# Patient Record
Sex: Female | Born: 1945 | Race: White | Hispanic: No | Smoking: Current every day smoker
Health system: Southern US, Community
[De-identification: ages and names within clinical notes are randomized; demographics above are authoritative.]

## PROBLEM LIST (undated history)

## (undated) DIAGNOSIS — C539 Malignant neoplasm of cervix uteri, unspecified: Secondary | ICD-10-CM

## (undated) DIAGNOSIS — F329 Major depressive disorder, single episode, unspecified: Secondary | ICD-10-CM

## (undated) DIAGNOSIS — D369 Benign neoplasm, unspecified site: Secondary | ICD-10-CM

## (undated) DIAGNOSIS — I639 Cerebral infarction, unspecified: Secondary | ICD-10-CM

## (undated) DIAGNOSIS — F32A Depression, unspecified: Secondary | ICD-10-CM

## (undated) DIAGNOSIS — A048 Other specified bacterial intestinal infections: Secondary | ICD-10-CM

## (undated) DIAGNOSIS — Z87442 Personal history of urinary calculi: Secondary | ICD-10-CM

## (undated) DIAGNOSIS — I2510 Atherosclerotic heart disease of native coronary artery without angina pectoris: Secondary | ICD-10-CM

## (undated) DIAGNOSIS — K861 Other chronic pancreatitis: Secondary | ICD-10-CM

## (undated) DIAGNOSIS — G8929 Other chronic pain: Secondary | ICD-10-CM

## (undated) DIAGNOSIS — M359 Systemic involvement of connective tissue, unspecified: Secondary | ICD-10-CM

## (undated) DIAGNOSIS — Z860100 Personal history of colon polyps, unspecified: Secondary | ICD-10-CM

## (undated) DIAGNOSIS — N200 Calculus of kidney: Secondary | ICD-10-CM

## (undated) DIAGNOSIS — R634 Abnormal weight loss: Secondary | ICD-10-CM

## (undated) DIAGNOSIS — J449 Chronic obstructive pulmonary disease, unspecified: Secondary | ICD-10-CM

## (undated) DIAGNOSIS — R109 Unspecified abdominal pain: Secondary | ICD-10-CM

## (undated) DIAGNOSIS — E785 Hyperlipidemia, unspecified: Secondary | ICD-10-CM

## (undated) DIAGNOSIS — F419 Anxiety disorder, unspecified: Secondary | ICD-10-CM

## (undated) DIAGNOSIS — K219 Gastro-esophageal reflux disease without esophagitis: Secondary | ICD-10-CM

## (undated) DIAGNOSIS — Z720 Tobacco use: Secondary | ICD-10-CM

## (undated) DIAGNOSIS — M549 Dorsalgia, unspecified: Secondary | ICD-10-CM

## (undated) DIAGNOSIS — K921 Melena: Secondary | ICD-10-CM

## (undated) HISTORY — DX: Hyperlipidemia, unspecified: E78.5

## (undated) HISTORY — DX: Cerebral infarction, unspecified: I63.9

## (undated) HISTORY — DX: Gastro-esophageal reflux disease without esophagitis: K21.9

## (undated) HISTORY — DX: Anxiety disorder, unspecified: F41.9

## (undated) HISTORY — PX: ABDOMINAL HYSTERECTOMY: SHX81

## (undated) HISTORY — DX: Calculus of kidney: N20.0

## (undated) HISTORY — DX: Abnormal weight loss: R63.4

## (undated) HISTORY — DX: Dorsalgia, unspecified: M54.9

## (undated) HISTORY — PX: PARTIAL HYSTERECTOMY: SHX80

## (undated) HISTORY — DX: Tobacco use: Z72.0

## (undated) HISTORY — DX: Benign neoplasm, unspecified site: D36.9

## (undated) HISTORY — DX: Atherosclerotic heart disease of native coronary artery without angina pectoris: I25.10

## (undated) HISTORY — DX: Personal history of colon polyps: Z86.010

## (undated) HISTORY — DX: Melena: K92.1

## (undated) HISTORY — DX: Malignant neoplasm of cervix uteri, unspecified: C53.9

## (undated) HISTORY — PX: APPENDECTOMY: SHX54

## (undated) HISTORY — DX: Depression, unspecified: F32.A

## (undated) HISTORY — DX: Other chronic pancreatitis: K86.1

## (undated) HISTORY — DX: Other chronic pain: G89.29

## (undated) HISTORY — DX: Personal history of colon polyps, unspecified: Z86.0100

## (undated) HISTORY — DX: Major depressive disorder, single episode, unspecified: F32.9

## (undated) HISTORY — DX: Other specified bacterial intestinal infections: A04.8

## (undated) HISTORY — DX: Chronic obstructive pulmonary disease, unspecified: J44.9

---

## 1999-07-18 ENCOUNTER — Encounter: Payer: Self-pay | Admitting: Neurosurgery

## 1999-07-18 ENCOUNTER — Ambulatory Visit (HOSPITAL_COMMUNITY): Admission: RE | Admit: 1999-07-18 | Discharge: 1999-07-18 | Payer: Self-pay | Admitting: Neurosurgery

## 1999-07-20 ENCOUNTER — Encounter: Payer: Self-pay | Admitting: Neurosurgery

## 2001-03-16 ENCOUNTER — Encounter: Payer: Self-pay | Admitting: Family Medicine

## 2001-03-16 ENCOUNTER — Ambulatory Visit (HOSPITAL_COMMUNITY): Admission: RE | Admit: 2001-03-16 | Discharge: 2001-03-16 | Payer: Self-pay | Admitting: Family Medicine

## 2001-03-18 ENCOUNTER — Other Ambulatory Visit: Admission: RE | Admit: 2001-03-18 | Discharge: 2001-03-18 | Payer: Self-pay | Admitting: Family Medicine

## 2001-03-18 ENCOUNTER — Ambulatory Visit (HOSPITAL_COMMUNITY): Admission: RE | Admit: 2001-03-18 | Discharge: 2001-03-18 | Payer: Self-pay | Admitting: Family Medicine

## 2001-03-18 ENCOUNTER — Encounter: Payer: Self-pay | Admitting: Family Medicine

## 2001-03-27 ENCOUNTER — Emergency Department (HOSPITAL_COMMUNITY): Admission: EM | Admit: 2001-03-27 | Discharge: 2001-03-27 | Payer: Self-pay | Admitting: *Deleted

## 2001-03-27 ENCOUNTER — Encounter: Payer: Self-pay | Admitting: *Deleted

## 2001-05-19 ENCOUNTER — Encounter (HOSPITAL_COMMUNITY): Admission: RE | Admit: 2001-05-19 | Discharge: 2001-06-18 | Payer: Self-pay | Admitting: Rheumatology

## 2001-05-23 ENCOUNTER — Encounter: Payer: Self-pay | Admitting: Rheumatology

## 2001-06-24 ENCOUNTER — Ambulatory Visit (HOSPITAL_COMMUNITY): Admission: RE | Admit: 2001-06-24 | Discharge: 2001-06-24 | Payer: Self-pay | Admitting: Family Medicine

## 2001-06-24 ENCOUNTER — Encounter: Payer: Self-pay | Admitting: Family Medicine

## 2001-07-14 ENCOUNTER — Encounter (HOSPITAL_COMMUNITY): Admission: RE | Admit: 2001-07-14 | Discharge: 2001-08-13 | Payer: Self-pay | Admitting: Rheumatology

## 2001-08-02 ENCOUNTER — Emergency Department (HOSPITAL_COMMUNITY): Admission: EM | Admit: 2001-08-02 | Discharge: 2001-08-02 | Payer: Self-pay | Admitting: Emergency Medicine

## 2001-08-22 ENCOUNTER — Encounter: Payer: Self-pay | Admitting: Family Medicine

## 2001-08-22 ENCOUNTER — Ambulatory Visit (HOSPITAL_COMMUNITY): Admission: RE | Admit: 2001-08-22 | Discharge: 2001-08-22 | Payer: Self-pay | Admitting: Family Medicine

## 2001-09-08 ENCOUNTER — Encounter (HOSPITAL_COMMUNITY): Admission: RE | Admit: 2001-09-08 | Discharge: 2001-10-08 | Payer: Self-pay | Admitting: Rheumatology

## 2002-04-17 ENCOUNTER — Ambulatory Visit (HOSPITAL_COMMUNITY): Admission: RE | Admit: 2002-04-17 | Discharge: 2002-04-17 | Payer: Self-pay | Admitting: Neurosurgery

## 2002-04-17 ENCOUNTER — Encounter: Payer: Self-pay | Admitting: Neurosurgery

## 2002-10-08 ENCOUNTER — Encounter: Payer: Self-pay | Admitting: Emergency Medicine

## 2002-10-08 ENCOUNTER — Emergency Department (HOSPITAL_COMMUNITY): Admission: EM | Admit: 2002-10-08 | Discharge: 2002-10-08 | Payer: Self-pay | Admitting: Emergency Medicine

## 2002-10-09 ENCOUNTER — Encounter: Payer: Self-pay | Admitting: Emergency Medicine

## 2002-10-09 ENCOUNTER — Ambulatory Visit (HOSPITAL_COMMUNITY): Admission: RE | Admit: 2002-10-09 | Discharge: 2002-10-09 | Payer: Self-pay | Admitting: Emergency Medicine

## 2002-11-20 ENCOUNTER — Encounter: Payer: Self-pay | Admitting: Family Medicine

## 2002-11-20 ENCOUNTER — Ambulatory Visit (HOSPITAL_COMMUNITY): Admission: RE | Admit: 2002-11-20 | Discharge: 2002-11-20 | Payer: Self-pay | Admitting: Family Medicine

## 2002-12-28 ENCOUNTER — Encounter (HOSPITAL_COMMUNITY): Admission: RE | Admit: 2002-12-28 | Discharge: 2003-01-27 | Payer: Self-pay | Admitting: Rheumatology

## 2002-12-29 ENCOUNTER — Encounter: Payer: Self-pay | Admitting: Rheumatology

## 2003-01-12 ENCOUNTER — Encounter: Payer: Self-pay | Admitting: Rheumatology

## 2003-02-15 ENCOUNTER — Encounter: Payer: Self-pay | Admitting: Rheumatology

## 2003-02-15 ENCOUNTER — Encounter (HOSPITAL_COMMUNITY): Admission: RE | Admit: 2003-02-15 | Discharge: 2003-03-21 | Payer: Self-pay | Admitting: Rheumatology

## 2003-03-20 ENCOUNTER — Encounter (HOSPITAL_COMMUNITY): Admission: RE | Admit: 2003-03-20 | Discharge: 2003-04-19 | Payer: Self-pay | Admitting: Rheumatology

## 2003-05-14 ENCOUNTER — Emergency Department (HOSPITAL_COMMUNITY): Admission: EM | Admit: 2003-05-14 | Discharge: 2003-05-14 | Payer: Self-pay | Admitting: Emergency Medicine

## 2003-05-28 ENCOUNTER — Emergency Department (HOSPITAL_COMMUNITY): Admission: EM | Admit: 2003-05-28 | Discharge: 2003-05-29 | Payer: Self-pay | Admitting: *Deleted

## 2003-06-27 ENCOUNTER — Encounter: Payer: Self-pay | Admitting: *Deleted

## 2003-06-27 ENCOUNTER — Emergency Department (HOSPITAL_COMMUNITY): Admission: EM | Admit: 2003-06-27 | Discharge: 2003-06-27 | Payer: Self-pay | Admitting: *Deleted

## 2003-11-21 ENCOUNTER — Emergency Department (HOSPITAL_COMMUNITY): Admission: EM | Admit: 2003-11-21 | Discharge: 2003-11-21 | Payer: Self-pay | Admitting: Emergency Medicine

## 2004-02-02 ENCOUNTER — Emergency Department (HOSPITAL_COMMUNITY): Admission: EM | Admit: 2004-02-02 | Discharge: 2004-02-02 | Payer: Self-pay | Admitting: *Deleted

## 2004-05-23 ENCOUNTER — Inpatient Hospital Stay (HOSPITAL_COMMUNITY): Admission: EM | Admit: 2004-05-23 | Discharge: 2004-05-25 | Payer: Self-pay | Admitting: Emergency Medicine

## 2004-06-09 ENCOUNTER — Ambulatory Visit (HOSPITAL_COMMUNITY): Admission: RE | Admit: 2004-06-09 | Discharge: 2004-06-09 | Payer: Self-pay | Admitting: Family Medicine

## 2004-09-28 HISTORY — PX: COLONOSCOPY W/ POLYPECTOMY: SHX1380

## 2004-09-28 HISTORY — PX: ESOPHAGOGASTRODUODENOSCOPY: SHX1529

## 2005-02-25 ENCOUNTER — Ambulatory Visit (HOSPITAL_COMMUNITY): Admission: RE | Admit: 2005-02-25 | Discharge: 2005-02-25 | Payer: Self-pay | Admitting: Family Medicine

## 2005-05-06 ENCOUNTER — Ambulatory Visit (HOSPITAL_COMMUNITY): Admission: RE | Admit: 2005-05-06 | Discharge: 2005-05-06 | Payer: Self-pay | Admitting: Family Medicine

## 2005-06-22 ENCOUNTER — Ambulatory Visit (HOSPITAL_COMMUNITY): Admission: RE | Admit: 2005-06-22 | Discharge: 2005-06-22 | Payer: Self-pay | Admitting: Urology

## 2005-08-13 ENCOUNTER — Ambulatory Visit (HOSPITAL_COMMUNITY): Admission: RE | Admit: 2005-08-13 | Discharge: 2005-08-13 | Payer: Self-pay | Admitting: Urology

## 2005-09-01 ENCOUNTER — Ambulatory Visit: Payer: Self-pay | Admitting: Internal Medicine

## 2005-09-08 ENCOUNTER — Encounter: Payer: Self-pay | Admitting: Internal Medicine

## 2005-09-08 ENCOUNTER — Ambulatory Visit: Payer: Self-pay | Admitting: Internal Medicine

## 2005-09-08 ENCOUNTER — Ambulatory Visit (HOSPITAL_COMMUNITY): Admission: RE | Admit: 2005-09-08 | Discharge: 2005-09-08 | Payer: Self-pay | Admitting: Internal Medicine

## 2005-09-09 ENCOUNTER — Encounter (HOSPITAL_COMMUNITY): Admission: RE | Admit: 2005-09-09 | Discharge: 2005-09-09 | Payer: Self-pay | Admitting: Internal Medicine

## 2005-09-13 ENCOUNTER — Inpatient Hospital Stay (HOSPITAL_COMMUNITY): Admission: EM | Admit: 2005-09-13 | Discharge: 2005-09-15 | Payer: Self-pay | Admitting: Emergency Medicine

## 2005-09-29 ENCOUNTER — Ambulatory Visit: Payer: Self-pay | Admitting: Internal Medicine

## 2005-11-19 ENCOUNTER — Ambulatory Visit (HOSPITAL_COMMUNITY): Admission: RE | Admit: 2005-11-19 | Discharge: 2005-11-19 | Payer: Self-pay | Admitting: Family Medicine

## 2005-11-30 ENCOUNTER — Ambulatory Visit (HOSPITAL_COMMUNITY): Admission: RE | Admit: 2005-11-30 | Discharge: 2005-11-30 | Payer: Self-pay | Admitting: Internal Medicine

## 2005-12-03 ENCOUNTER — Ambulatory Visit: Payer: Self-pay | Admitting: Cardiology

## 2005-12-09 ENCOUNTER — Ambulatory Visit: Payer: Self-pay | Admitting: Cardiology

## 2005-12-09 ENCOUNTER — Encounter (HOSPITAL_COMMUNITY): Admission: RE | Admit: 2005-12-09 | Discharge: 2006-01-08 | Payer: Self-pay | Admitting: Cardiology

## 2005-12-20 ENCOUNTER — Emergency Department (HOSPITAL_COMMUNITY): Admission: EM | Admit: 2005-12-20 | Discharge: 2005-12-20 | Payer: Self-pay | Admitting: Emergency Medicine

## 2006-02-02 ENCOUNTER — Ambulatory Visit: Payer: Self-pay | Admitting: Cardiology

## 2006-03-01 ENCOUNTER — Ambulatory Visit: Payer: Self-pay | Admitting: Internal Medicine

## 2006-03-01 ENCOUNTER — Ambulatory Visit: Payer: Self-pay | Admitting: Cardiology

## 2006-06-20 ENCOUNTER — Emergency Department (HOSPITAL_COMMUNITY): Admission: EM | Admit: 2006-06-20 | Discharge: 2006-06-20 | Payer: Self-pay | Admitting: Emergency Medicine

## 2006-06-28 ENCOUNTER — Ambulatory Visit (HOSPITAL_COMMUNITY): Admission: RE | Admit: 2006-06-28 | Discharge: 2006-06-28 | Payer: Self-pay | Admitting: Urology

## 2006-09-16 ENCOUNTER — Ambulatory Visit (HOSPITAL_COMMUNITY): Admission: RE | Admit: 2006-09-16 | Discharge: 2006-09-16 | Payer: Self-pay | Admitting: Urology

## 2006-10-11 ENCOUNTER — Ambulatory Visit (HOSPITAL_COMMUNITY): Admission: RE | Admit: 2006-10-11 | Discharge: 2006-10-11 | Payer: Self-pay | Admitting: Family Medicine

## 2006-10-29 ENCOUNTER — Ambulatory Visit (HOSPITAL_COMMUNITY): Admission: RE | Admit: 2006-10-29 | Discharge: 2006-10-29 | Payer: Self-pay | Admitting: Family Medicine

## 2007-04-25 ENCOUNTER — Ambulatory Visit (HOSPITAL_COMMUNITY): Admission: RE | Admit: 2007-04-25 | Discharge: 2007-04-25 | Payer: Self-pay | Admitting: Family Medicine

## 2007-05-04 ENCOUNTER — Ambulatory Visit (HOSPITAL_COMMUNITY): Admission: RE | Admit: 2007-05-04 | Discharge: 2007-05-04 | Payer: Self-pay | Admitting: Family Medicine

## 2008-02-03 ENCOUNTER — Ambulatory Visit (HOSPITAL_COMMUNITY): Admission: RE | Admit: 2008-02-03 | Discharge: 2008-02-03 | Payer: Self-pay | Admitting: Family Medicine

## 2008-09-05 ENCOUNTER — Ambulatory Visit (HOSPITAL_COMMUNITY): Admission: RE | Admit: 2008-09-05 | Discharge: 2008-09-05 | Payer: Self-pay | Admitting: Family Medicine

## 2008-09-28 HISTORY — PX: EUS: SHX5427

## 2008-10-08 ENCOUNTER — Ambulatory Visit (HOSPITAL_COMMUNITY): Admission: RE | Admit: 2008-10-08 | Discharge: 2008-10-08 | Payer: Self-pay | Admitting: Urology

## 2008-10-16 ENCOUNTER — Ambulatory Visit: Payer: Self-pay | Admitting: Internal Medicine

## 2008-10-22 ENCOUNTER — Encounter: Payer: Self-pay | Admitting: Gastroenterology

## 2008-10-22 LAB — CONVERTED CEMR LAB
Alkaline Phosphatase: 83 units/L (ref 39–117)
Amylase: 44 units/L (ref 0–105)
BUN: 10 mg/dL (ref 6–23)
Basophils Absolute: 0 10*3/uL (ref 0.0–0.1)
CO2: 26 meq/L (ref 19–32)
Chloride: 102 meq/L (ref 96–112)
Creatinine, Ser: 0.53 mg/dL (ref 0.40–1.20)
Eosinophils Absolute: 0.2 10*3/uL (ref 0.0–0.7)
Eosinophils Relative: 3 % (ref 0–5)
Glucose, Bld: 87 mg/dL (ref 70–99)
LDL Cholesterol: 150 mg/dL — ABNORMAL HIGH (ref 0–99)
Lymphocytes Relative: 38 % (ref 12–46)
MCHC: 33.4 g/dL (ref 30.0–36.0)
Monocytes Absolute: 0.6 10*3/uL (ref 0.1–1.0)
Monocytes Relative: 9 % (ref 3–12)
Neutrophils Relative %: 50 % (ref 43–77)
Platelets: 301 10*3/uL (ref 150–400)
Potassium: 4.2 meq/L (ref 3.5–5.3)
RBC: 4.85 M/uL (ref 3.87–5.11)
RDW: 12.1 % (ref 11.5–15.5)
Total Bilirubin: 0.3 mg/dL (ref 0.3–1.2)
Total Protein: 6.9 g/dL (ref 6.0–8.3)
Triglycerides: 83 mg/dL (ref <150)
WBC: 7.3 10*3/uL (ref 4.0–10.5)

## 2008-11-26 ENCOUNTER — Encounter (INDEPENDENT_AMBULATORY_CARE_PROVIDER_SITE_OTHER): Payer: Self-pay | Admitting: General Surgery

## 2008-11-26 ENCOUNTER — Other Ambulatory Visit: Admission: RE | Admit: 2008-11-26 | Discharge: 2008-11-26 | Payer: Self-pay | Admitting: General Surgery

## 2008-12-19 DIAGNOSIS — M549 Dorsalgia, unspecified: Secondary | ICD-10-CM | POA: Insufficient documentation

## 2008-12-19 DIAGNOSIS — J449 Chronic obstructive pulmonary disease, unspecified: Secondary | ICD-10-CM | POA: Insufficient documentation

## 2008-12-19 DIAGNOSIS — K219 Gastro-esophageal reflux disease without esophagitis: Secondary | ICD-10-CM | POA: Insufficient documentation

## 2008-12-19 DIAGNOSIS — N200 Calculus of kidney: Secondary | ICD-10-CM | POA: Insufficient documentation

## 2008-12-19 DIAGNOSIS — J301 Allergic rhinitis due to pollen: Secondary | ICD-10-CM

## 2008-12-19 DIAGNOSIS — R109 Unspecified abdominal pain: Secondary | ICD-10-CM | POA: Insufficient documentation

## 2008-12-19 DIAGNOSIS — F341 Dysthymic disorder: Secondary | ICD-10-CM

## 2008-12-19 DIAGNOSIS — Z720 Tobacco use: Secondary | ICD-10-CM

## 2008-12-19 DIAGNOSIS — Z8719 Personal history of other diseases of the digestive system: Secondary | ICD-10-CM | POA: Insufficient documentation

## 2008-12-19 DIAGNOSIS — K861 Other chronic pancreatitis: Secondary | ICD-10-CM

## 2008-12-19 DIAGNOSIS — R110 Nausea: Secondary | ICD-10-CM

## 2008-12-19 DIAGNOSIS — Z8541 Personal history of malignant neoplasm of cervix uteri: Secondary | ICD-10-CM | POA: Insufficient documentation

## 2008-12-20 ENCOUNTER — Ambulatory Visit: Payer: Self-pay | Admitting: Internal Medicine

## 2008-12-20 DIAGNOSIS — Z860100 Personal history of colon polyps, unspecified: Secondary | ICD-10-CM | POA: Insufficient documentation

## 2008-12-29 ENCOUNTER — Emergency Department (HOSPITAL_COMMUNITY): Admission: EM | Admit: 2008-12-29 | Discharge: 2008-12-30 | Payer: Self-pay | Admitting: Emergency Medicine

## 2009-01-01 ENCOUNTER — Encounter: Payer: Self-pay | Admitting: Internal Medicine

## 2009-01-07 ENCOUNTER — Encounter: Payer: Self-pay | Admitting: Gastroenterology

## 2009-01-11 ENCOUNTER — Ambulatory Visit: Payer: Self-pay | Admitting: Internal Medicine

## 2009-01-11 ENCOUNTER — Ambulatory Visit (HOSPITAL_COMMUNITY): Admission: RE | Admit: 2009-01-11 | Discharge: 2009-01-11 | Payer: Self-pay | Admitting: Internal Medicine

## 2009-01-11 ENCOUNTER — Encounter: Payer: Self-pay | Admitting: Internal Medicine

## 2009-01-11 HISTORY — PX: OTHER SURGICAL HISTORY: SHX169

## 2009-01-14 ENCOUNTER — Encounter: Payer: Self-pay | Admitting: Internal Medicine

## 2009-01-22 ENCOUNTER — Telehealth: Payer: Self-pay | Admitting: Gastroenterology

## 2009-02-08 ENCOUNTER — Telehealth (INDEPENDENT_AMBULATORY_CARE_PROVIDER_SITE_OTHER): Payer: Self-pay | Admitting: *Deleted

## 2009-02-14 ENCOUNTER — Ambulatory Visit: Payer: Self-pay | Admitting: Gastroenterology

## 2009-02-14 ENCOUNTER — Ambulatory Visit (HOSPITAL_COMMUNITY): Admission: RE | Admit: 2009-02-14 | Discharge: 2009-02-14 | Payer: Self-pay | Admitting: Gastroenterology

## 2009-02-19 ENCOUNTER — Ambulatory Visit: Payer: Self-pay | Admitting: Internal Medicine

## 2009-02-19 ENCOUNTER — Telehealth (INDEPENDENT_AMBULATORY_CARE_PROVIDER_SITE_OTHER): Payer: Self-pay

## 2009-02-22 ENCOUNTER — Encounter: Payer: Self-pay | Admitting: Internal Medicine

## 2009-02-22 LAB — CONVERTED CEMR LAB: TSH: 1.576 microintl units/mL (ref 0.350–4.500)

## 2009-02-26 ENCOUNTER — Encounter: Payer: Self-pay | Admitting: Internal Medicine

## 2009-03-05 ENCOUNTER — Encounter (INDEPENDENT_AMBULATORY_CARE_PROVIDER_SITE_OTHER): Payer: Self-pay | Admitting: *Deleted

## 2009-03-28 ENCOUNTER — Ambulatory Visit (HOSPITAL_COMMUNITY): Admission: RE | Admit: 2009-03-28 | Discharge: 2009-03-28 | Payer: Self-pay | Admitting: Family Medicine

## 2009-07-30 ENCOUNTER — Encounter (INDEPENDENT_AMBULATORY_CARE_PROVIDER_SITE_OTHER): Payer: Self-pay | Admitting: *Deleted

## 2009-09-03 ENCOUNTER — Ambulatory Visit: Payer: Self-pay | Admitting: Internal Medicine

## 2009-09-19 ENCOUNTER — Telehealth (INDEPENDENT_AMBULATORY_CARE_PROVIDER_SITE_OTHER): Payer: Self-pay

## 2009-09-19 ENCOUNTER — Encounter: Payer: Self-pay | Admitting: Internal Medicine

## 2010-01-24 ENCOUNTER — Ambulatory Visit (HOSPITAL_COMMUNITY): Admission: RE | Admit: 2010-01-24 | Discharge: 2010-01-24 | Payer: Self-pay | Admitting: Family Medicine

## 2010-03-04 ENCOUNTER — Encounter: Payer: Self-pay | Admitting: Urgent Care

## 2010-03-21 ENCOUNTER — Encounter (INDEPENDENT_AMBULATORY_CARE_PROVIDER_SITE_OTHER): Payer: Self-pay | Admitting: *Deleted

## 2010-05-27 ENCOUNTER — Ambulatory Visit: Payer: Self-pay | Admitting: Internal Medicine

## 2010-05-27 DIAGNOSIS — R630 Anorexia: Secondary | ICD-10-CM

## 2010-05-27 DIAGNOSIS — R634 Abnormal weight loss: Secondary | ICD-10-CM

## 2010-05-27 DIAGNOSIS — Z8719 Personal history of other diseases of the digestive system: Secondary | ICD-10-CM | POA: Insufficient documentation

## 2010-06-03 ENCOUNTER — Encounter: Payer: Self-pay | Admitting: Internal Medicine

## 2010-06-04 LAB — CONVERTED CEMR LAB
ALT: <8 units/L (ref 0–35)
Calcium: 8.9 mg/dL (ref 8.4–10.5)
Glucose, Bld: 75 mg/dL (ref 70–99)
Hemoglobin: 14.2 g/dL (ref 12.0–15.0)
MCHC: 34.2 g/dL (ref 30.0–36.0)
MCV: 90.4 fL (ref 78.0–100.0)
RBC: 4.59 M/uL (ref 3.87–5.11)
TSH: 1.566 microintl units/mL (ref 0.350–4.500)

## 2010-06-06 ENCOUNTER — Ambulatory Visit (HOSPITAL_COMMUNITY): Admission: RE | Admit: 2010-06-06 | Discharge: 2010-06-06 | Payer: Self-pay | Admitting: Internal Medicine

## 2010-07-17 ENCOUNTER — Encounter (INDEPENDENT_AMBULATORY_CARE_PROVIDER_SITE_OTHER): Payer: Self-pay | Admitting: *Deleted

## 2010-08-29 ENCOUNTER — Inpatient Hospital Stay (HOSPITAL_COMMUNITY)
Admission: EM | Admit: 2010-08-29 | Discharge: 2010-08-31 | Payer: Self-pay | Source: Home / Self Care | Admitting: Psychiatry

## 2010-09-10 ENCOUNTER — Ambulatory Visit: Payer: Self-pay | Admitting: Internal Medicine

## 2010-09-10 DIAGNOSIS — R5381 Other malaise: Secondary | ICD-10-CM

## 2010-09-10 DIAGNOSIS — R5383 Other fatigue: Secondary | ICD-10-CM

## 2010-09-18 LAB — CONVERTED CEMR LAB: Cortisol - AM: 20.4 ug/dL (ref 4.3–22.4)

## 2010-09-28 ENCOUNTER — Emergency Department (HOSPITAL_COMMUNITY)
Admission: EM | Admit: 2010-09-28 | Discharge: 2010-09-28 | Payer: Self-pay | Source: Home / Self Care | Admitting: Emergency Medicine

## 2010-10-30 NOTE — Letter (Signed)
Summary: Recall Office Visit  [facility]  [address]   [place], [place] [address]   Phone: [phone]  Fax: [phone]      March 21, 2010   [name] [address] [place], [place]  [address] 1946/04/16   Dear [name],   According to our records, it is time for you to schedule a follow-up office visit with [place].   At your convenience, please call [phone] to schedule an office visit. If you have any questions, concerns, or feel that this letter is in error, we would appreciate your call.   Sincerely,    Diana Eves  [facility] Ph: [phone]   Fax: [phone]

## 2010-10-30 NOTE — Letter (Signed)
Summary: CT SCAN ORDER  CT SCAN ORDER   Imported By: Ave Filter 05/27/2010 15:23:51  _____________________________________________________________________  External Attachment:    Type:   Image     Comment:   External Document  Appended Document: CT SCAN ORDER Per Dr Kearney Hard pt may mix her contrast with any liquid of her choice.She will just need to bring the liquid into the hospital to drink.

## 2010-10-30 NOTE — Letter (Signed)
Summary: CT SCAN CHEST APPROVAL LETTER  CT SCAN CHEST APPROVAL LETTER   Imported By: Ave Filter 06/03/2010 08:49:10  _____________________________________________________________________  External Attachment:    Type:   Image     Comment:   External Document

## 2010-10-30 NOTE — Assessment & Plan Note (Signed)
Summary: fu chronic pancreatitis/ss   Visit Type:  Follow-up Visit Primary Care Provider:  McInnis  Chief Complaint:  F/U pancreatitis.  History of Present Illness: History of chronic calcific pancreatitis and weight loss. Distant history of alcohol abuse;  weight down another 4 ponds; now weighs  76 pounds. She does not get hungry. She has absolutely has no abdominal pain, nausea or vomiting. She denies diarrhea melena or hematochezia. She is continuing on Creon and proton pump inhibitor therapy. She denies illicit drug use or alcohol.  Prior EUS demonstrated changes consistent with chronic pancreatitis nothing amenable to endoscopic or surgical therapy.  Recent colonoscopy demonstrated tubular adenoma which was removed. She's due for surveillance examination 5 years.  Patient tells me she's never weighed over 100 pounds in her life except when she was pregnant. She tells me her nerves have been "workup" lately and this is why she is not hungry. She is a long, long term smoker.   Current Medications (verified): 1)  Duragesic 100 Mcg/hr Pt72 (Fentanyl) .... Every 3 Days 2)  Tylenol Extra Strength 500 Mg Tabs (Acetaminophen) .... As Needed 3)  Ativan 0.5 Mg Tabs (Lorazepam) .... Take 1 Tablet By Mouth Three Times A Day As Needed 4)  Cymbalta 60 Mg Cpep (Duloxetine Hcl) .... Take 1 Capsule By Mouth Once A Day 5)  Advair Diskus 100-50 Mcg/dose Misc (Fluticasone-Salmeterol) .... Inhale 1 Puff Two Times A Day 6)  Nitrostat 0.4 Mg Subl (Nitroglycerin) .... As Needed 7)  Colace 100 Mg Caps (Docusate Sodium) .... Once Daily As Needed 8)  Lipitor 10 Mg Tabs (Atorvastatin Calcium) .... Once Daily 9)  Toprol Xl 50 Mg Xr24h-Tab (Metoprolol Succinate) .... 1/2 Q Am, 1/2 Q Pm 10)  Fosamax 70 Mg Tabs (Alendronate Sodium) .... Once Weekly 11)  Omeprazole 20 Mg Cpdr (Omeprazole) .... Once Daily 12)  Creon 24000 Unit Cpep (Pancrelipase (Lip-Prot-Amyl)) .... 2 By Mouth With Meals, 1 By Mouth With  Snacks  Allergies (verified): 1)  ! Ibuprofen 2)  ! * Ivp Dye  Past History:  Past Medical History: Last updated: 2008/12/25  Current Problems (verified):  1)  Heart Disease  (ICD-429.9) 2)  Colonic Polyps, Adenomatous, Hx of  (ICD-V12.72) 3)  Smoker  (ICD-305.1) 4)  Cad  (ICD-414.00) 5)  Back Pain, Chronic  (ICD-724.5) 6)  Cervical Cancer, Hx of  (ICD-V10.41) 7)  Allergic Rhinitis, Seasonal  (ICD-477.0) 8)  Anxiety Depression  (ICD-300.4) 9)  Hx of Nausea  (ICD-787.02) 10)  Renal Calculus, Hx of  (ICD-V13.01) 11)  Chronic Pancreatitis  (ICD-577.1) 12)  Hematochezia, Hx of  (ICD-V12.79) 13)  Abdominal Pain  (ICD-789.00) 14)  Gerd  (ICD-530.81) 15)  COPD  (ICD-496) 16)  Fm Hx Malignant Neoplasm Gastrointestinal Tract  (ICD-V16.0) 17) EGD/TCS 12/06 by Dr. Jena Gauss - benign gastric nodule, ext hemorrhoids, multiple polyps in colon (adenomatous and one with tubular morphology)  Past Surgical History: Last updated: 12/25/2008 Partial hysterectomy Appendectomy  Family History: Last updated: 12/25/2008 Father: deceased age 42's with lymphoma and MI Mother:  Siblings: Aunt with lung cancer and breast cancer. Cousin and sister with pancreatitis but no alcohol use. Maternal grandmother with colon cancer.   Social History: Last updated: 2008/12/25 Marital Status:Divorced Children: 3 Occupation: Disabled  Patient currently smokes. 1ppd Alcohol Use - none in over 12 years, never heavy drinker  Risk Factors: Smoking Status: current (12-25-08)  Vital Signs:  Patient profile:   65 year old female Height:      64 inches Weight:  76 pounds BMI:     13.09 Temp:     98.0 degrees F oral Pulse rate:   72 / minute BP sitting:   90 / 66  (left arm) Cuff size:   regular  Vitals Entered By: Cloria Spring LPN (May 27, 2010 2:38 PM)  Physical Exam  General:  frail built chronically ill-appearing lady alert versus no acute distress Eyes:  no scleral icterus.  Conjunctiva are pink Abdomen:  flat positive bowel sounds soft nontender without mass or organomegaly  Impression & Recommendations: Impression: 65 year old lady with chronic pancreatitis, failure to thrive, weight loss, anorexia. Long-term smoker. I'm concerned  about the possibility of a co-existing underlying neoplasm - less likely of GI tract origin. I doubt she has a complicating issues otherwise regarding -  her pancreatitis. She has no abdominal pain her abdominal exam is benign today.  She needs a screening chest CT; she has a history significant contrast allergy.  Recommendations chest abd and pelvis CT with IV and oral contrast - not previously done  because of allergy). We'll premedicate with prednisone 50 mg 13 hours, 7 hours, and one hour prior to the procedure; Benadryl 50 mg p.o. one hour prior to the CT  Check CBC chem 20 and TSH today.  Further recommendations to follow.  Other Orders: T-TSH [phone]) T-Comprehensive Metabolic Panel [phone]) T-CBC No Diff ([id number]) Est. Patient Level IV (57846)  Appended Document: fu chronic pancreatitis/ss labs all normal; await CT scan results.  Appended Document: fu chronic pancreatitis/ss Pt informed labs normal.

## 2010-10-30 NOTE — Assessment & Plan Note (Signed)
Summary: PROBLEMS WITH BOWELS/SS   Visit Type:  Follow-up Visit Primary Care Provider:  mcinnis  Chief Complaint:  constipation and weakness.  History of Present Illness: 65 year old lady with chronic pancreatitis ,weight loss, GERD, history of colonic adenoma. Here for followup; complains of fatigue all the time; complains of dizziness when she stands up. Was hospitalized for 4 days - 2 weeks ago per her report. Previously, CT of her abdomen, chest and pelvis to rule out occult malignancy revealed no evidence of such. colonic adenoma removed in 2010 and she's will be due for routine surveillance 2015. She does not have any diarrhea and constipation. She has gained 2 pounds since her last office visit. I did check a CBC and LFTs on her previously along with a TSH everything was normal; no alcohol for many many years.  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) 3)  Weight Loss  (ICD-783.21) 4)  Heart Disease  (ICD-429.9) 5)  Colonic Polyps, Adenomatous, Hx of  (ICD-V12.72) 6)  Smoker  (ICD-305.1) 7)  Cad  (ICD-414.00) 8)  Back Pain, Chronic  (ICD-724.5) 9)  Cervical Cancer, Hx of  (ICD-V10.41) 10)  Allergic Rhinitis, Seasonal  (ICD-477.0) 11)  Anxiety Depression  (ICD-300.4) 12)  Hx of Nausea  (ICD-787.02) 13)  Renal Calculus, Hx of  (ICD-V13.01) 14)  Chronic Pancreatitis  (ICD-577.1) 15)  Hematochezia, Hx of  (ICD-V12.79) 16)  Abdominal Pain  (ICD-789.00) 17)  Gerd  (ICD-530.81) 18)  COPD  (ICD-496) 19)  Fm Hx Malignant Neoplasm Gastrointestinal Tract  (ICD-V16.0)  Current Medications (verified): 1)  Duragesic 100 Mcg/hr Pt72 (Fentanyl) .... Every 3 Days 2)  Tylenol Extra Strength 500 Mg Tabs (Acetaminophen) .... As Needed 3)  Ativan 0.5 Mg Tabs (Lorazepam) .... Take 1-2 Tablets By Mouth Three Times A Day As Needed 4)  Cymbalta 60 Mg Cpep (Duloxetine Hcl) .... Take 1 Capsule By Mouth Once A Day 5)  Advair Diskus 100-50 Mcg/dose  Misc (Fluticasone-Salmeterol) .... Inhale 1 Puff Two Times A Day 6)  Nitrostat 0.4 Mg Subl (Nitroglycerin) .... As Needed 7)  Colace 100 Mg Caps (Docusate Sodium) .... Once Daily As Needed 8)  Lipitor 10 Mg Tabs (Atorvastatin Calcium) .... Once Daily 9)  Toprol Xl 50 Mg Xr24h-Tab (Metoprolol Succinate) .... 1/2 Q Am, 1/2 Q Pm 10)  Fosamax 70 Mg Tabs (Alendronate Sodium) .... Once Weekly 11)  Omeprazole 20 Mg Cpdr (Omeprazole) .... Once Daily 12)  Creon 24000 Unit Cpep (Pancrelipase (Lip-Prot-Amyl)) .... 2 By Mouth With Meals, 1 By Mouth With Snacks 13)  Potassium .... Once Daily 14)  Dicyclomine Hcl 10 Mg Caps (Dicyclomine Hcl) .... Qid  Allergies (verified): 1)  ! Ibuprofen 2)  ! * Ivp Dye  Past History:  Past Medical History: Last updated: 12/20/2008  Current Problems (verified):  1)  Heart Disease  (ICD-429.9) 2)  Colonic Polyps, Adenomatous, Hx of  (ICD-V12.72) 3)  Smoker  (ICD-305.1) 4)  Cad  (ICD-414.00) 5)  Back Pain, Chronic  (ICD-724.5) 6)  Cervical Cancer, Hx of  (ICD-V10.41) 7)  Allergic Rhinitis, Seasonal  (ICD-477.0) 8)  Anxiety Depression  (ICD-300.4) 9)  Hx of Nausea  (ICD-787.02) 10)  Renal Calculus, Hx of  (ICD-V13.01) 11)  Chronic Pancreatitis  (ICD-577.1) 12)  Hematochezia, Hx of  (ICD-V12.79) 13)  Abdominal Pain  (ICD-789.00) 14)  Gerd  (ICD-530.81) 15)  COPD  (ICD-496) 16)  Fm Hx Malignant Neoplasm Gastrointestinal Tract  (ICD-V16.0) 17) EGD/TCS 12/06 by Dr. Jena Gauss - benign  gastric nodule, ext hemorrhoids, multiple polyps in colon (adenomatous and one with tubular morphology)  Past Surgical History: Last updated: 12-27-08 Partial hysterectomy Appendectomy  Family History: Last updated: December 27, 2008 Father: deceased age 66's with lymphoma and MI Mother:  Siblings: Aunt with lung cancer and breast cancer. Cousin and sister with pancreatitis but no alcohol use. Maternal grandmother with colon cancer.   Social History: Last updated:  Dec 27, 2008 Marital Status:Divorced Children: 3 Occupation: Disabled  Patient currently smokes. 1ppd Alcohol Use - none in over 12 years, never heavy drinker  Risk Factors: Smoking Status: current (12-27-08)  Vital Signs:  Patient profile:   65 year old female Height:      64 inches Weight:      78 pounds BMI:     13.44 Temp:     97.9 degrees F oral Pulse rate:   80 / minute BP sitting:   100 / 64  (left arm) Cuff size:   regular  Vitals Entered By: Hendricks Limes LPN (September 10, 2010 11:06 AM)  Physical Exam  General:  chronically cachectic clearly cadaver securing lady but appears her baseline in no acute distress Eyes:  no scleral icterus Abdomen:  flat positive bowel sounds soft nontender without appreciable mass or organomegaly  Impression & Recommendations: Impression: A 65 year old lady with chronic pancreatitis and GERD fairly well-controlled at this time. History colonic adenoma; due surveillance colonoscopy 2015. All in all, her GI symptoms are well controlled. I am concerned about her constitutional symptoms.  Recommendations: Continue her current GI regimen including acid suppression and pancreatic enzyme supplement  We'll go ahead and do a fasting a.m. cortisol and perform a celiac screening with a serum IgA level to finalize her evaluation to date. Further recommendations to follow.  Other Orders: T-Cortisol, AM (21308) T-Celiac Disease Ab Evaluation (8002) T-igA (65784) Est. Patient Level IV (69629)

## 2010-10-30 NOTE — Medication Information (Signed)
Summary: Tax adviser   Imported By: Diana Eves 03/04/2010 11:05:28  _____________________________________________________________________  External Attachment:    Type:   Image     Comment:   External Document  Appended Document: RX Folder:OMEPRAZOLE 20    Prescriptions: OMEPRAZOLE 20 MG CPDR (OMEPRAZOLE) once daily  #31 x 11   Entered and Authorized by:   Joselyn Arrow FNP-BC   Signed by:   Joselyn Arrow FNP-BC on 03/04/2010   Method used:   Electronically to        [facility] (retail)       [address] [place], [place]  [address]       Ph: [phone]       Fax: [phone]   RxID:   [id number]

## 2010-10-30 NOTE — Letter (Signed)
Summary: Recall Office Visit  [facility]  [address]   [place], [place] [address]   Phone: [phone]  Fax: [phone]      July 17, 2010   [name] [address] [place], [place]  [address] Feb 14, 1946   Dear [name],   According to our records, it is time for you to schedule a follow-up office visit with [place].   At your convenience, please call [phone] to schedule an office visit. If you have any questions, concerns, or feel that this letter is in error, we would appreciate your call.   Sincerely,    Rosine Beat  [facility] Ph: [phone]   Fax: [phone]

## 2010-11-05 ENCOUNTER — Encounter: Payer: Self-pay | Admitting: Gastroenterology

## 2010-11-13 NOTE — Medication Information (Signed)
Summary: CREON CAP   CREON CAP   Imported By: Rexene Alberts 11/05/2010 11:23:29  _____________________________________________________________________  External Attachment:    Type:   Image     Comment:   External Document  Appended Document: CREON CAP     Prescriptions: CREON 24000 UNIT CPEP (PANCRELIPASE (LIP-PROT-AMYL)) 2 by mouth with meals, 1 by mouth with snacks  #240 x 3   Entered and Authorized by:   Gerrit Halls NP   Signed by:   Gerrit Halls NP on 11/05/2010   Method used:   Faxed to ...       [facility] (retail)       [address]       [place], [place]  [address]       Ph: [phone]       Fax: [phone]   RxID:   [id number]

## 2010-11-28 ENCOUNTER — Encounter: Payer: Self-pay | Admitting: Urgent Care

## 2010-12-08 LAB — BASIC METABOLIC PANEL
BUN: 12 mg/dL (ref 6–23)
CO2: 27 mEq/L (ref 19–32)
CO2: 27 mEq/L (ref 19–32)
CO2: 28 mEq/L (ref 19–32)
Calcium: 8 mg/dL — ABNORMAL LOW (ref 8.4–10.5)
Calcium: 8 mg/dL — ABNORMAL LOW (ref 8.4–10.5)
Calcium: 9.6 mg/dL (ref 8.4–10.5)
Chloride: 107 mEq/L (ref 96–112)
Chloride: 109 mEq/L (ref 96–112)
Chloride: 97 mEq/L (ref 96–112)
Creatinine, Ser: 0.63 mg/dL (ref 0.4–1.2)
GFR calc Af Amer: >60 mL/min (ref >60)
GFR calc Af Amer: >60 mL/min (ref >60)
GFR calc Af Amer: >60 mL/min (ref >60)
GFR calc Af Amer: >60 mL/min (ref >60)
GFR calc non Af Amer: >60 mL/min (ref >60)
GFR calc non Af Amer: >60 mL/min (ref >60)
GFR calc non Af Amer: >60 mL/min (ref >60)
Glucose, Bld: 114 mg/dL — ABNORMAL HIGH (ref 70–99)
Glucose, Bld: 95 mg/dL (ref 70–99)
Potassium: 3.8 mEq/L (ref 3.5–5.1)
Potassium: 4.3 mEq/L (ref 3.5–5.1)
Sodium: 135 mEq/L (ref 135–145)
Sodium: 137 mEq/L (ref 135–145)
Sodium: 139 mEq/L (ref 135–145)
Sodium: 139 mEq/L (ref 135–145)

## 2010-12-08 LAB — HEPATIC FUNCTION PANEL
ALT: 10 U/L (ref 0–35)
Indirect Bilirubin: 0.3 mg/dL (ref 0.3–0.9)

## 2010-12-08 LAB — CBC
MCV: 87.5 fL (ref 78.0–100.0)
Platelets: 377 10*3/uL (ref 150–400)

## 2010-12-08 LAB — DIFFERENTIAL
Basophils Absolute: 0.1 10*3/uL (ref 0.0–0.1)
Basophils Relative: 1 % (ref 0–1)
Eosinophils Relative: 3 % (ref 0–5)
Lymphocytes Relative: 39 % (ref 12–46)
Lymphs Abs: 4.4 10*3/uL — ABNORMAL HIGH (ref 0.7–4.0)
Monocytes Relative: 7 % (ref 3–12)
Neutro Abs: 5.6 10*3/uL (ref 1.7–7.7)

## 2010-12-08 LAB — CLOSTRIDIUM DIFFICILE EIA

## 2010-12-08 LAB — LIPASE, BLOOD: Lipase: 52 U/L (ref 11–59)

## 2010-12-09 NOTE — Medication Information (Signed)
Summary: CREON CAP  CREON CAP   Imported By: Rexene Alberts 11/28/2010 10:35:19  _____________________________________________________________________  External Attachment:    Type:   Image     Comment:   External Document  Appended Document: CREON CAP Pt should have RFs, please let pharm know  Appended Document: CREON CAP Chelsea at [facility] said the pt's Rx has been filled since 11/05/2010, and she hasn't picked it up. i called to tell the pt. She said she has checked on it several times and was told it was not there. I told her to call and speak with [facility].

## 2011-01-06 LAB — HEMOGLOBIN AND HEMATOCRIT, BLOOD: HCT: 41 % (ref 36.0–46.0)

## 2011-01-07 ENCOUNTER — Encounter: Payer: Self-pay | Admitting: Physician Assistant

## 2011-01-07 ENCOUNTER — Ambulatory Visit (INDEPENDENT_AMBULATORY_CARE_PROVIDER_SITE_OTHER): Payer: 59 | Admitting: Physician Assistant

## 2011-01-07 DIAGNOSIS — R0989 Other specified symptoms and signs involving the circulatory and respiratory systems: Secondary | ICD-10-CM

## 2011-01-07 DIAGNOSIS — K861 Other chronic pancreatitis: Secondary | ICD-10-CM

## 2011-01-07 DIAGNOSIS — R55 Syncope and collapse: Secondary | ICD-10-CM

## 2011-01-07 DIAGNOSIS — R0789 Other chest pain: Secondary | ICD-10-CM

## 2011-01-07 DIAGNOSIS — R002 Palpitations: Secondary | ICD-10-CM

## 2011-01-07 DIAGNOSIS — R42 Dizziness and giddiness: Secondary | ICD-10-CM

## 2011-01-07 DIAGNOSIS — R079 Chest pain, unspecified: Secondary | ICD-10-CM | POA: Insufficient documentation

## 2011-01-07 LAB — CBC WITH DIFFERENTIAL/PLATELET
Basophils Absolute: 0 10*3/uL (ref 0.0–0.1)
Eosinophils Relative: 1 % (ref 0–5)
Lymphocytes Relative: 27 % (ref 12–46)
Lymphs Abs: 1.9 10*3/uL (ref 0.7–4.0)
MCV: 91.6 fL (ref 78.0–100.0)
Neutro Abs: 4.6 10*3/uL (ref 1.7–7.7)
Platelets: 271 10*3/uL (ref 150–400)
RBC: 4.64 MIL/uL (ref 3.87–5.11)
RDW: 13 % (ref 11.5–15.5)
WBC: 7.1 10*3/uL (ref 4.0–10.5)

## 2011-01-07 LAB — COMPREHENSIVE METABOLIC PANEL
ALT: <8 U/L (ref 0–35)
AST: 14 U/L (ref 0–37)
Albumin: 4.8 g/dL (ref 3.5–5.2)
Alkaline Phosphatase: 69 U/L (ref 39–117)
BUN: 10 mg/dL (ref 6–23)
Calcium: 9.7 mg/dL (ref 8.4–10.5)
Chloride: 100 mEq/L (ref 96–112)
Potassium: 3.8 mEq/L (ref 3.5–5.3)
Sodium: 139 mEq/L (ref 135–145)
Total Protein: 7.6 g/dL (ref 6.0–8.3)

## 2011-01-07 LAB — TSH: TSH: 0.982 u[IU]/mL (ref 0.350–4.500)

## 2011-01-07 NOTE — Progress Notes (Signed)
HPI  This is a 65 year old white female patient who has multiple medical problems and hasn't been seen here since about 2006. She has a history of chronic pancreatitis weight loss and dizziness. She also has a diagnosis of coronary artery disease although searching through her records I cannot verify this. She did have a stress Myoview in 2006 that was normal. She had normal LV function and normal myocardial perfusion. She states she's never had a cardiac catheterization or heart disease.  She comes today complaining of worsening dizziness and presyncope. She actually had one episode where she did pass out completely and she was found to have a hypokalemia and had lost a lot of weight because of her pancreatitis. She went from 92 pounds down to 76 pounds.  Her recent complaints of dizziness occurred any time a day have occurred while driving washing dishes, or just sitting down. She states she becomes dizzy has blurred vision associated headaches but sometimes has a light chest pressure, shortness of breath, numbness on her left side, and rapid heartbeat. The symptoms usually resolve on their own in about 30 minutes. She said the dizziness is worsening and is usually associated with the headaches and left-sided numbness but not always with the chest pressure and palpitations. Her symptoms and not aggravated with change of position or exertion. She takes metoprolol daily and has not missed any doses. She says it was held when she was hospitalized 2 months ago because of low heart rate.  Allergies  Allergen Reactions  . Ibuprofen   . Iohexol      Desc: HIVES AND SWELLING WITH I.V.P DYE, NEEDS PRE-MEDS.     No current outpatient prescriptions on file prior to visit.    Past Medical History  Diagnosis Date  . Heart disease   . Hx of colonic polyps     adenomatous  . Smoker   . CAD (coronary artery disease)   . Back pain, chronic   . Cervical cancer   . Allergic rhinitis   . Anxiety and  depression   . Nausea   . Renal calculus   . Pancreatitis chronic   . Hematochezia   . Abdominal pain   . GERD (gastroesophageal reflux disease)   . COPD (chronic obstructive pulmonary disease)   . Gastrointestinal malignancy     neoplasm    Past Surgical History  Procedure Date  . Esophagogastroduodenoscopy     TCS 08/2005 by Dr.Rourke-benign gastric nodule ext hemorroids multiple polyps in colon (adenomatous and one with tubular morphology)  . Partial hysterectomy   . Appendectomy     Past Family History Patient's father had an MI in his 40's died of lymphoma   History   Social History  . Marital Status: Divorced    Spouse Name: N/A    Number of Children: 3  . Years of Education: N/A   Occupational History  . disabled    Social History Main Topics  . Smoking status: Current Everyday Smoker -- 1.0 packs/day    Types: Cigarettes  . Smokeless tobacco: Never Used  . Alcohol Use: No  . Drug Use: No  . Sexually Active:    Other Topics Concern  . Not on file   Social History Narrative  . No narrative on file    ROS: See HPI Eyes: blurred vision with her dizziness Ears:Negative for hearing loss, tinnitus Cardiovascular: Negative for dyspnea on exertion, near-syncope, orthopnea, paroxysmal nocturnal dyspnia and edema, claudication, cyanosis,.  Respiratory:   Negative for cough,  hemoptysis, sleep disturbances due to breathing, sputum production and wheezing.   Endocrine: Negative for cold intolerance and heat intolerance.  Hematologic/Lymphatic: Negative for adenopathy and bleeding problem. Does not bruise/bleed easily.  Musculoskeletal: Negative.   Gastrointestinal: Negative for nausea, vomiting, reflux, abdominal pain, diarrhea, constipation.   Genitourinary: Negative for bladder incontinence, dysuria, flank pain, frequency, hematuria, hesitancy, nocturia and urgency.  Neurological: Negative.  Allergic/Immunologic: Negative for environmental  allergies.   PHYSICAL EXAM Well-nournished, in no acute distress. Neck: No JVD, HJR, Bruit, or thyroid enlargement Lungs: No tachypnea, clear without wheezing, rales, or rhonchi Cardiovascular: RRR, PMI not displaced, heart sounds normal, no murmurs, gallops, bruit, thrill, or heave. Abdomen: BS normal. Soft without organomegaly, masses, lesions or tenderness. Extremities: without cyanosis, clubbing or edema. Good distal pulses bilateral SKin: Warm, no lesions or rashes  Musculoskeletal: No deformities Neuro: no focal signs  BP 120/80  Pulse 88  Ht 5\' 5"  (1.651 m)  Wt 80 lb (36.288 kg)  BMI 13.31 kg/m2  SpO2 92%  EKG: Normal sinus rhythm with PVCs poor R-wave progression no acute change  ASSESSMENT AND PLAN:

## 2011-01-07 NOTE — Assessment & Plan Note (Signed)
Patient has significant dizziness at rest. She has had one syncopal episode associated with hypokalemia and dehydration. She is also having significant headaches, visual disturbances, and drying of her hands and numbness. We will refer her to neurology especially in light of a prior CVA. She was not orthostatic in the office today. We will also check another quarter to see if any arrhythmias are contributing to this.

## 2011-01-07 NOTE — Patient Instructions (Addendum)
**Note De-Identified [name] Obfuscation** Your physician recommends that you schedule a follow-up appointment in: 1 month Your physician has recommended that you wear an event monitor. Event monitors are medical devices that record the heart's electrical activity. Doctors most often [place] these monitors to diagnose arrhythmias. Arrhythmias are problems with the speed or rhythm of the heartbeat. The monitor is a small, portable device. You can wear one while you do your normal daily activities. This is usually used to diagnose what is causing palpitations/syncope (passing out). Your physician recommends that you return for lab work in: today Your physician discussed the hazards of tobacco use. Tobacco use cessation is recommended and techniques and options to help you quit were discussed. You have been referred to Neurology for syncope, headaches and numbness Your physician has requested that you have en exercise stress myoview. For further information please visit [email or web address]. Please follow instruction sheet, as given.

## 2011-01-07 NOTE — Assessment & Plan Note (Signed)
Patient has chronic pancreatitis which contributes to her weight loss and dehydration. This may all be related to her recent problems with dizziness. We will order labs to rule out any significant deficiencies.

## 2011-01-07 NOTE — Assessment & Plan Note (Signed)
Patient does have occasional chest tightness with her dizziness. She does have multiple cardiac risk factors including ongoing tobacco abuse, hyperlipidemia, and family history of coronary artery disease. She has a diagnosis of coronary artery disease in her chart but I cannot find anything to document this. She had a negative Myoview scan 2006. We will order a stress Myoview to rule out ischemia

## 2011-01-07 NOTE — Assessment & Plan Note (Signed)
The patient complains of palpitations associated with her dizziness and presyncope she does not have them every time she becomes dizzy but it does last for 30 minutes or so. We will give her and her memory quarter to try and document any arrhythmias.

## 2011-01-08 ENCOUNTER — Other Ambulatory Visit: Payer: Self-pay | Admitting: Cardiology

## 2011-01-13 ENCOUNTER — Encounter (HOSPITAL_COMMUNITY)
Admission: RE | Admit: 2011-01-13 | Discharge: 2011-01-13 | Disposition: A | Discharge status: Home / Self Care | Payer: Medicare Other | Source: Ambulatory Visit | Attending: Cardiology | Admitting: Cardiology

## 2011-01-13 ENCOUNTER — Ambulatory Visit (INDEPENDENT_AMBULATORY_CARE_PROVIDER_SITE_OTHER): Payer: Medicare Other | Admitting: *Deleted

## 2011-01-13 ENCOUNTER — Encounter (HOSPITAL_COMMUNITY): Payer: Medicare Other

## 2011-01-13 ENCOUNTER — Encounter (HOSPITAL_COMMUNITY): Payer: Self-pay

## 2011-01-13 DIAGNOSIS — R0789 Other chest pain: Secondary | ICD-10-CM

## 2011-01-13 DIAGNOSIS — R55 Syncope and collapse: Secondary | ICD-10-CM

## 2011-01-13 DIAGNOSIS — R079 Chest pain, unspecified: Secondary | ICD-10-CM

## 2011-01-13 MED ORDER — TECHNETIUM TC 99M TETROFOSMIN IV KIT
30.0000 | PACK | Freq: Once | INTRAVENOUS | Status: AC | PRN
  Administered 2011-01-13: 29.7 via INTRAVENOUS

## 2011-01-13 MED ORDER — TECHNETIUM TC 99M TETROFOSMIN IV KIT
10.0000 | PACK | Freq: Once | INTRAVENOUS | Status: AC | PRN
  Administered 2011-01-13: 9.4 via INTRAVENOUS

## 2011-01-13 NOTE — Progress Notes (Signed)
Please see full report of Nuclear Stress Test.

## 2011-01-14 ENCOUNTER — Encounter: Payer: 59 | Admitting: *Deleted

## 2011-01-21 ENCOUNTER — Telehealth: Payer: Self-pay | Admitting: Cardiology

## 2011-01-21 DIAGNOSIS — R55 Syncope and collapse: Secondary | ICD-10-CM

## 2011-01-21 NOTE — Telephone Encounter (Signed)
Patient made aware of appointment with Dr.Penumalli for 02/20/2011 / tg

## 2011-02-10 NOTE — H&P (Signed)
NAMEFREIDA, NEBEL             ACCOUNT NO.:  [id number]   MEDICAL RECORD NO.:  [id number]          PATIENT TYPE:  AMB   LOCATION:  DAY                           FACILITY:  [facility]   PHYSICIAN:  R. Roetta Sessions, M.D. DATE OF BIRTH:  November 01, 1945   DATE OF ADMISSION:  DATE OF DISCHARGE:  LH                              HISTORY & PHYSICAL   CHIEF COMPLAINT:  Time for colonoscopy.   HISTORY OF PRESENT ILLNESS:  Ms. Boak is a 65 year old Caucasian  female with history of adenomatous polyps, family history of colon  cancer, history of chronic calcific pancreatitis who presents today to  schedule her colonoscopy.  She had a colonoscopy back in December 2006  done for intermittent hematochezia.  She was found to have multiple  polyps subhepatic and splenic flexure.  Pathology revealed adenomatous  changes.  At the hepatic flexure, she had adenomatous polyp with  predominately tubular morphology.  She also had EGD at that time, had a  gastric nodule, which was benign.  She had suspected post polypectomy  bleed requiring transfusion and hospitalization for 5 days after  procedure.  She did not require a repeat colonoscopy.  Prior to her  coming to see [place] back in 2006, she had had a CT that showed some  calcification at the pancreatic head and uncinate process.  She was put  on pancreatic enzymes by her PCP at that time.  She states she has a  sister and a cousin who both have had pancreatitis and neither one of  them drink.  No further workup was done.  She has had multiple CTs along  the way and she had a noncontrast study done this month by her urologist  that showed stable findings in the pancreas.  No evidence of gallstones  on CT nor ultrasound in 2006.   She states that she has chronic right lower sided abdominal pain.  She  feels it from her history of kidney stones.  Sometimes, the pain is  worse when she eats.  This also worse if she has nausea and then tries  to eat.  She denies  any vomiting.  Denies any heartburn.  No dysphagia  or odynophagia.  Couple of months ago, she had an episode of bright red  blood per rectum but none since.  She states it was fairly large in  amount.  She denies any constipation, diarrhea.  No melena.  She states  she has been off the pancreatic enzymes for about a year just because  she was having trouble getting her medication at the pharmacy.  Her  weight has been stable.   CURRENT MEDICATIONS:  1. Duragesic patch 75 mcg, change every third day.  2. Theo-24 400 mg daily.  3. Tylenol Extra Strength 500 mg 4 daily as needed.  4. Ativan 0.5 mg t.i.d.  5. Cymbalta 60 mg daily.  6. Advair 100/50 1 puff b.i.d.  7. NitroQuick p.r.n.  8. Phenergan 25 mg p.r.n.  9. Colace 100 mg 1-2 b.i.d. p.r.n.  10.Metoprolol b.i.d.   ALLERGIES:  IBUPROFEN and IVP DYE.   PAST MEDICAL  HISTORY:  COPD, chronic GERD, depression, anxiety, seasonal  allergies.  History of nephrolithiasis, history of cervical cancer,  status post partial hysterectomy, chronic back pain, heart disease,  chronic calcific pancreatitis, history of colonic polyps.  She has had  an appendectomy.   FAMILY HISTORY:  Father deceased in a 7 days history of lymphoma and MI.  She had an aunt with lung cancer and breast cancer, sister and a cousin  with history of pancreatitis but no alcohol use.  No family history of  colon cancer.   SOCIAL HISTORY:  She is divorced with three children.  She is disabled.  She smokes a pack of cigarettes daily.  No alcohol use in over 12 years  period.  She states she used to drink some alcohol in the weekends in  the past.   REVIEW OF SYSTEMS:  GI:  See HPI for GI.  CONSTITUTIONAL:  She denies  any recent weight loss.  CARDIOPULMONARY:  Denies chest pain, shortness  of breath, palpitations.  GENITOURINARY:  Denies dysuria and does have  some gross hematuria and is followed by Urology.   PHYSICAL EXAMINATION:  VITAL SIGNS:  Weight 85.5 pounds,  height 5 feet 5  inches, temperature 97.8, blood pressure 110/80, pulse 60.  GENERAL:  Pleasant thin Caucasian female in no acute distress.  SKIN:  Warm and dry.  No jaundice.  HEENT:  Sclerae nonicteric.  Oropharyngeal mucosa moist and pink.  CHEST:  Lungs are clear to auscultation.  CARDIAC:  Reveals regular rate and rhythm.  No murmurs.  ABDOMEN:  Positive bowel sounds.  Abdomen is flat and nondistended.  She  has mild tenderness to the right lower abdomen.  No rebound or guarding.  No organomegaly or masses.  No abdominal bruits or hernias.  She has  mild epigastric tenderness as well.  LOWER EXTREMITIES:  No edema.   IMPRESSION:  Ms. Gulden is a 65-year lady with history of colonic  polyps, who is here for surveillance colonoscopy.  She also has a  history of gastroesophageal reflux disease, chronic calcific  pancreatitis, and ongoing chronic right-sided abdominal pain.  She does  complain of some postprandial upper abdominal pain and right lower  quadrant abdominal pain.  Interestingly, she really not had any sort of  workup of her chronic calcific pancreatitis.  Last abdominal ultrasound  was over 3 years ago, and there was no evidence of stones at that time.  I cannot exclude the possibility of biliary source for chronic  pancreatitis.  She really denies any history of prior heavy alcohol  abuse.  She does admit today that she had history of have cholesterol,  which has not really been followed up on in years.   PLAN:  1. Pursue colonoscopy with Dr. Jena Gauss.  She was not able to take to the      HalfLytely prep last time due to vomiting but did tolerate      magnesium citrate.  She is requesting quick prep today.  I      discussed risks, alternatives, and benefits with regards to      colonoscopy including, but not limited to the risk reaction,      medication, bleeding,      infection, perforation.  She is agreeable to proceed.  2. CBC, CMET,  amylase, lipase, and fasting  lipid panel  3. Further recommendations to follow.      Tana Coast, P.AJonathon Bellows, M.D.  Electronically Signed  LL/MEDQ  D:  10/16/2008  T:  10/17/2008  Job:  [id number]   cc:   Angus G. Renard Matter, MD  Fax: [phone]

## 2011-02-10 NOTE — Op Note (Signed)
NAMEISIDRA, MINGS             ACCOUNT NO.:  [id number]   MEDICAL RECORD NO.:  [id number]          PATIENT TYPE:  AMB   LOCATION:  DAY                           FACILITY:  [facility]   PHYSICIAN:  R. Roetta Sessions, M.D. DATE OF BIRTH:  1946-03-27   DATE OF PROCEDURE:  01/11/2009  DATE OF DISCHARGE:                               OPERATIVE REPORT   PROCEDURES:  Ileocolonoscopy and snare polypectomy.   INDICATIONS FOR PROCEDURE:  A 65 year old lady with a history of  multiple colonic adenomas removed to 2006.  Colonoscopy complicated by  post polypectomy bleed requiring admission and transfusion, but no  follow-up colonoscopy.  She has done well.  She has not had any  subsequent GI symptoms.  She is here for surveillance.  The risks,  benefits, alternatives and limitations were reviewed and questions  answered.  She is agreeable.  Please see the documentation in the  medical record.   PROCEDURE NOTE:  O2 saturation, blood pressure, pulse and respirations  were monitored throughout the entire procedure.  Conscious sedation:  Versed 5 mg IV, Demerol 100 mg IV in divided doses.  Instrument:  Pentax  video chip system (pediatric scope).   FINDINGS:  Digital rectal exam revealed no abnormalities.   ENDOSCOPIC FINDINGS:  The prep was good.  Colon:  Colonic mucosa was  surveyed from the rectosigmoid junction through the left transverse and  right colon to the appendiceal orifice, ileocecal valve and cecum.  These structures were well seen and photographed for the record.  The  terminal ileum was then measured at 5 cm.  From this level, the scope  was slowly and cautiously withdrawn.  All previously mentioned mucosal  surfaces were again seen.  On the way in, there was an 8 mm polyp found  at the splenic flexure which was removed with one pass of the hot snare  cautery and recovered through the scope.  The remainder of her colonic  mucosa appeared normal.  The scope was pulled down in the  rectum where a  thorough examination of the rectal mucosa, including retroflexed view of  the anal verge demonstrated no abnormalities.  The patient tolerated the  procedure well and was reacted in endoscopy.  Cecal withdrawal time 6  minutes.   IMPRESSION:  1. Normal rectum.  2. Polyp at the splenic flexure, status post hot snare removal.  The      remainder of the colonic mucosa and the terminal ileum mucosa      appeared normal.   RECOMMENDATIONS:  1. The patient admonished not to take any form of aspirin or arthritis      medications whatsoever for the next 5 days.  2. Follow-up on path.  3. Further recommendations to follow.      Jonathon Bellows, M.D.  Electronically Signed     RMR/MEDQ  D:  01/11/2009  T:  01/11/2009  Job:  [id number]   cc:   Angus G. Renard Matter, MD  Fax: [phone]

## 2011-02-12 ENCOUNTER — Encounter: Payer: Self-pay | Admitting: Physician Assistant

## 2011-02-13 ENCOUNTER — Ambulatory Visit (INDEPENDENT_AMBULATORY_CARE_PROVIDER_SITE_OTHER): Payer: Medicare Other | Admitting: Cardiology

## 2011-02-13 ENCOUNTER — Encounter: Payer: Self-pay | Admitting: Cardiology

## 2011-02-13 DIAGNOSIS — F17200 Nicotine dependence, unspecified, uncomplicated: Secondary | ICD-10-CM

## 2011-02-13 DIAGNOSIS — I2510 Atherosclerotic heart disease of native coronary artery without angina pectoris: Secondary | ICD-10-CM

## 2011-02-13 DIAGNOSIS — R079 Chest pain, unspecified: Secondary | ICD-10-CM

## 2011-02-13 DIAGNOSIS — C539 Malignant neoplasm of cervix uteri, unspecified: Secondary | ICD-10-CM

## 2011-02-13 DIAGNOSIS — Z860100 Personal history of colon polyps, unspecified: Secondary | ICD-10-CM

## 2011-02-13 DIAGNOSIS — Z720 Tobacco use: Secondary | ICD-10-CM

## 2011-02-13 DIAGNOSIS — K861 Other chronic pancreatitis: Secondary | ICD-10-CM

## 2011-02-13 NOTE — Group Therapy Note (Signed)
NAMEATHALIE, NEWHARD             ACCOUNT NO.:  [id number]   MEDICAL RECORD NO.:  [id number]          PATIENT TYPE:  INP   LOCATION:  A332                          FACILITY:  [facility]   PHYSICIAN:  Angus G. Renard Matter, MD   DATE OF BIRTH:  1946-04-03   DATE OF PROCEDURE:  09/14/2005  DATE OF DISCHARGE:                                   PROGRESS NOTE   SUBJECTIVE:  This patient was admitted with a history of gastrointestinal  bleeding with passage of blood clots and bright red blood.  She has been  transfused with 1 unit of blood and is on IV Protonix.  Her current  hemoglobin is 8.8 and hematocrit 25.2.   OBJECTIVE:  VITAL SIGNS:  Blood pressure 78/44, respirations 18, pulse 63,  temperature 98.1.  HEART:  Regular rhythm.  LUNGS:  Clear to P&A.  ABDOMEN:  No palpable organs or masses.   ASSESSMENT:  Patient admitted with gastrointestinal bleeding and anemia had  a history of recent upper and lower endoscopy with removal of multiple  colonic polyps.   PLAN:  Plan to continue transfusion and will give IV potassium.  Obtain GI  consult.      Angus G. Renard Matter, MD  Electronically Signed     AGM/MEDQ  D:  09/14/2005  T:  09/14/2005  Job:  [id number]

## 2011-02-13 NOTE — Consult Note (Signed)
[facility]  Patient:    [name], [name] Visit Number: [id number] MRN: [id number]          Service Type: RHE Location: [facility] Attending Physician:  Aundra Dubin Dictated by:   Aundra Dubin, M.D. Proc. Date: 07/14/01 Admit Date:  07/14/2001   CC:         Butch Penny, M.D.                          Consultation Report  CHIEF COMPLAINT:  Back pain.  HISTORY OF PRESENT ILLNESS:  Ms. Fillingim returns for followup with her back pain.  She says she is no better and is about the same.  Pain primarily locates around the right scapula.  She does say that there is radiating pain into both legs.  She feels that her left leg will almost buckle.  There has been no falls recently.  She says she fell about two years ago.  We did have her lower extremities evaluated for any arterial blood flow blockages.  Her ABIs bilaterally were 1.05 which is just fine.  She has not found that the Flexeril has greatly helped.  Overall, she is about the same.  Her weight is up about 4 pounds.  She continues to smoke.  MEDICATIONS:  1. Uniphyl 400 mg q.d.  2. Advair disks b.i.d.  3. Prozac 20 mg q.d.  4. Lorazepam 0.5 mg b.i.d.  5. Lipitor 10 mg q.d.  6. Norvasc 5 mg q.d.  7. Hydrocodone three q.d.  8. Nitroglycerin rare.  9. Mobic 7.5 mg q.d. 10. Flexeril 10 mg half h.s. 11. Recent Macrobid 100 mg b.i.d.  PHYSICAL EXAMINATION  VITAL SIGNS:  Weight 91 pounds, blood pressure 90/60, respirations 16.  MUSCULOSKELETAL:  The hands, wrists, elbows, shoulders, neck:  Good range of motion without synovitis.  Palpation around the shoulder, neck, occiput, upper paraspinous muscles finds tenderness with moderate weakness.  While in a sitting position raising her knees to 0 degrees and hyperextending at the hip is met with complaints of upper back pain around the scapula.  There was no radiating pain with that maneuver.  While lying she has a similar experience with the  straight leg lift at about 80 degrees of upper thoracic and back area pain.  Strength in the thighs is 5/5.  ASSESSMENT AND PLAN: 1. Back ache, nonspecific.  I will have her go for physical therapy to learn    range of motion.  Also, heat could help this area.  I have encouraged her    to do the stretching at home on a regular basis.  She will continue the    Flexeril if it is helping with her sleep.  She also may have a fibromyalgia    type syndrome.  She is not improved at this point. 2. Long-term smoking.  I will see her back in about two months. Dictated by:   Aundra Dubin, M.D. Attending Physician:  Aundra Dubin DD:  07/14/01 TD:  07/14/01 Job: [id number]

## 2011-02-13 NOTE — H&P (Signed)
NAMERAVEN, FURNAS             ACCOUNT NO.:  [id number]   MEDICAL RECORD NO.:  [id number]          PATIENT TYPE:  AMB   LOCATION:  DAY                           FACILITY:  [facility]   PHYSICIAN:  Dennie Maizes, M.D.   DATE OF BIRTH:  1945/11/23   DATE OF ADMISSION:  09/16/2006  DATE OF DISCHARGE:  LH                              HISTORY & PHYSICAL   CHIEF COMPLAINT:  Intermittent mild hematuria, intermittent right flank  pain.   HISTORY OF PRESENT ILLNESS:  This 65 year old female is referred to me  by Dr. Margo Aye.  I have seen her in the past.  She complains of  intermittent right flank pain which has been present for the past 10-15  years.  She has noticed intermittent mild hematuria recently.  She was  evaluated for similar symptoms last year.  She is allergic to IV  contrast.  CT scan of the abdomen without contrast revealed no evidence  of urinary calculi or obstruction.  No masses are noted.  She was  scheduled to undergo cystoscopy and bilateral retrograde pyelograms in  November 2006.  She declined to have the contrast study.  Cystoscopy at  that time was negative.   The patient has been voiding without any difficulty at present.  She has  urinary frequency x4-5 and nocturia x1-2.  There is no history of fever,  chills or dysuria.   Recent noncontrast CT scan of the abdomen and pelvis revealed no  evidence of renal mass, hydronephrosis or urinary calculi.  Urine  cytology was negative for any malignant cells.  Urine culture and  sensitivity revealed no growth.  The patient has relief of hematuria at  present.   PAST MEDICAL HISTORY:  1. History of coronary artery disease.  2. Chronic back pain.  3. Chronic right flank pain.  4. COPD.  5. Anxiety disorder.   MEDICATIONS:  1. Nitroglycerin p.r.n. for chest pain.  2. Pain pills.   ALLERGIES:  She is allergic to IV CONTRAST and IBUPROFEN.   PHYSICAL EXAMINATION:  HEENT:  Normal.  NECK:  No masses.  LUNGS:  Clear to  auscultation.  HEART:  Regular rate and rhythm with no murmurs.  ABDOMEN:  Soft.  GU:  No palpable flank mass.  There is no costovertebral angle  tenderness.  Bladder is not palpable.  No suprapubic tenderness.   IMPRESSION:  1. History of hematuria.  2. Right flank pain.   PLAN:  Cystoscopy under anesthesia in [facility].  I informed the  patient regarding the diagnosis, operative details, alternate treatments  and outcome, possible risks and complications and she has agreed for the  procedure to be done.      Dennie Maizes, M.D.  Electronically Signed     SK/MEDQ  D:  09/15/2006  T:  09/15/2006  Job:  [id number]   cc:   Catalina Pizza, M.D.  Fax: [phone]

## 2011-02-13 NOTE — H&P (Signed)
[name], [name]             ACCOUNT NO.:  [id number]   MEDICAL RECORD NO.:  [id number]          PATIENT TYPE:  INP   LOCATION:  A227                          FACILITY:  [facility]   PHYSICIAN:  Calvert Cantor, M.D.     DATE OF BIRTH:  03-15-1946   DATE OF ADMISSION:  09/13/2005  DATE OF DISCHARGE:  LH                                HISTORY & PHYSICAL   PRIMARY CARE PHYSICIAN:  Dr. Dimas Aguas.   PRESENTING COMPLAINT:  Gastrointestinal bleed.   HISTORY OF PRESENT ILLNESS:  This is a 65 year old Caucasian female with a  past medical history of reflux esophagitis who underwent an upper and lower  endoscopy on Tuesday.  The endoscopy report states that multiple colonic  polyps were found, and snare polypectomies were done.  In addition, a small  nodule in the stomach was also biopsied.  After the endoscopies, the patient  was feeling well up until last night when she awoke having some abdominal  cramping.  When she got up to go the bathroom, she felt lightheaded.  She  had a liquid bowel movement mixed with clots and then bright red blood.  After she got up from the toilet, she felt dizzy and fell to the ground.  She states that she did not completely lose consciousness.  She was helped  up by her son and brought into the ER.  She has not had any more episodes of  GI bleed since then.  She has had nausea but no vomiting.   The patient states that she has along history of nausea.  She has had a poor  appetite.  She has lost about 10-15 pounds over the past couple of months.  She also complains of right upper abdominal pain which is currently a 4/6.  She states that she has had this pain also for a few months now.  She had a  study done on Wednesday, she states, to evaluate her gallbladder function.   REVIEW OF SYSTEMS:  She is not complaining of any fevers, chills, no  complaints of vomiting, no diarrhea over the past couple of days.  The only  episode of bleeding was last night.   Currently, she does not complain of any  chest pain or shortness of breath.  She is not feeling dizzy or lightheaded.  She is wanting to eat.   PAST MEDICAL HISTORY:  1.  Coronary artery disease.  2.  COPD.  3.  Anxiety disorder.  4.  Chronic back pain.  5.  Reflux esophagitis.  6.  Hyperlipidemia.   MEDICATIONS:  1.  Duragesic patch 75 mcg.  2.  Nexium 40 mg daily.  3.  Cymbalta 60 mg daily.  4.  Theo-Dur 400 mg daily.  5.  Pancreatic enzymes, she does not know the name.  She states that she      takes one tablet before each meal.  6.  Advair twice a day, dose unknown.   ALLERGIES:  She is allergic to IV CONTRAST and IBUPROFEN.   PHYSICAL EXAMINATION:  VITAL SIGNS:  Temperature 96.8 degrees, blood  pressure 94/55, pulse  86, respiratory rate 20, pulse oximetry 97%.  HEENT:  Atraumatic, normocephalic.  Pupils equal, round, and reactive to  light.  She has a squint in her right eye. Oral mucosa appears dry.  NECK:  Supple, no lymphadenopathy.  HEART:  Regular rate and rhythm.  LUNGS:  Clear bilaterally.  ABDOMEN:  Soft, nondistended.  She has some mild tenderness in the right  upper quadrant.  Bowel sounds are positive.  EXTREMITIES:  No cyanosis, clubbing, or edema.   BLOOD WORK:  WBC count 10.7, hemoglobin 9.8, hematocrit 28.1, platelets  292,000.  PT 14.3, INR 1.1, PTT 33.  Sodium 140, potassium 3.2, chloride  104, bicarb 28, glucose 112.  BUN 8, creatinine 0.6.  Alkaline phosphatase  66, AST 15, ALT 8.  Total protein 5.1, albumin 2.7, calcium 7.7.   Hepatobiliary scan done on 09/09/2005 shows a patent cystic and biliary  ducts.  The gallbladder EF is 57.6%.   ASSESSMENT AND PLAN:  This a 65 year old white female who is being admitted  for an episode of GI bleed in which she passed clots and bright red blood.  The patient's blood pressure is currently on the low side, although I noted  in her past medical history, it is usually on the high side.  Therefore, I  will  bolus her with IV fluids.  I will transfuse her one unit of blood to  get her hemoglobin above 10 as she does have a history of heart disease.  I  will resume her home medications.  Currently, she will be n.p.o. until I  speak with the GI doctor on call.  She will have DVT prophylaxis with  compression stockings.  Protonix will be made IV.  Blood work will be  rechecked after her transfusion.      Calvert Cantor, M.D.  Electronically Signed     SR/MEDQ  D:  09/13/2005  T:  09/13/2005  Job:  [id number]

## 2011-02-13 NOTE — Group Therapy Note (Signed)
NAME:  [name], [name]                       ACCOUNT NO.:  [id number]   MEDICAL RECORD NO.:  [id number]                   PATIENT TYPE:  INP   LOCATION:  A325                                 FACILITY:  [facility]   PHYSICIAN:  Angus G. Renard Matter, M.D.              DATE OF BIRTH:  [birth year]   DATE OF PROCEDURE:  DATE OF DISCHARGE:                                   PROGRESS NOTE   SUBJECTIVE:  This patient was admitted with nausea and vomiting and crampy  abdominal pain.  She was admitted with what was felt to be gastroenteritis  and dehydration.  She does have chronic obstructive pulmonary disease.  A CT  of the abdomen was negative for acute abnormality.  Pelvis was unremarkable.   OBJECTIVE:  VITAL SIGNS:  Blood pressure 138/95, respirations 18, pulse 91,  temperature 99.2.  LUNGS:  Clear to P&A.  HEART:  Regular rhythm.  ABDOMEN:  No palpable __________  or masses.   ASSESSMENT:  The patient is admitted with gastroenteritis and chronic  obstructive pulmonary disease.   PLAN:  Advance diet today.  Continue current regimen.      ___________________________________________                                            Ishmael Holter. Renard Matter, M.D.   AGM/MEDQ  D:  05/24/2004  T:  05/24/2004  Job:  [id number]

## 2011-02-13 NOTE — Progress Notes (Signed)
HPI : [name] returns to the office as scheduled for continued assessment and treatment of fatigue, palpitations, chest discomfort and generalized malaise.  She has increased her caloric intake, gained some weight and feels somewhat better.  Testing is remarkably benign with normal laboratory studies and an unremarkable event monitoring so far.  She has had only a few symptomatic spells with chest discomfort, fatigue, palpitations and lightheadedness with no arrhythmias noted other than PACs and PVCs.  Current Outpatient Prescriptions on File Prior to Visit  Medication Sig Dispense Refill  . acetaminophen (TYLENOL) 500 MG tablet Take 500 mg by mouth as needed.        Marland Kitchen alendronate (FOSAMAX) 70 MG tablet Take 70 mg by mouth every 7 (seven) days. Take with a full glass of water on an empty stomach.       . docusate sodium (COLACE) 100 MG capsule Take 100 mg by mouth as needed.        . DULoxetine (CYMBALTA) 60 MG capsule Take 60 mg by mouth daily.        . fentaNYL (DURAGESIC - DOSED MCG/HR) 100 MCG/HR Place 1 patch onto the skin every 3 (three) days.        . Fluticasone-Salmeterol (ADVAIR DISKUS) 100-50 MCG/DOSE AEPB Inhale 1 puff into the lungs every 12 (twelve) hours.        Marland Kitchen LORazepam (ATIVAN) 0.5 MG tablet Take 0.5 mg by mouth every 8 (eight) hours.        . metoprolol (TOPROL-XL) 50 MG 24 hr tablet Take 50 mg by mouth daily. Take 1/2 tab bid        . nitroGLYCERIN (NITROSTAT) 0.4 MG SL tablet Place 0.4 mg under the tongue every 5 (five) minutes as needed.        Marland Kitchen omeprazole (PRILOSEC) 20 MG capsule Take 20 mg by mouth daily.        . Pancrelipase, Lip-Prot-Amyl, (CREON) 24000 UNITS CPEP Take 1 capsule by mouth 3 (three) times daily. 2 tabs with meals 1 with snacks          Allergies  Allergen Reactions  . Ibuprofen   . Iohexol      Desc: HIVES AND SWELLING WITH I.V.P DYE, NEEDS PRE-MEDS.       Past medical history, social history, and family history reviewed and updated.  ROS:  No orthopnea, PND, pedal edema, cough or sputum production.  Chronic hoarseness.  PHYSICAL EXAM: BP 113/78  Pulse 84  Wt 80 lb (36.288 kg) General-Well developed; no acute distress; sallow complexion Body habitus-painfully thin Neck-No JVD; no carotid bruits Lungs-clear lung fields; resonant to percussion; increased A-P diameter; decreased breath sounds Cardiovascular-normal PMI; normal S1 and S2; modest systolic murmur Abdomen-normal bowel sounds; soft and non-tender without masses or organomegaly Musculoskeletal-No deformities, no cyanosis or clubbing Neurologic-Normal cranial nerves; symmetric strength and tone Skin-Warm, no significant lesions Extremities-distal pulses intact; no edema  Laboratory: Normal complete metabolic profile, CBC, .TSH in 4/2012a Event recorder-normal sinus rhythm, PACs, PVCs, sinus tachycardia.  Symptomatic spells with chest discomfort, fatigue and lightheadedness occurred with no significant associated arrhythmias.  ASSESSMENT AND PLAN:

## 2011-02-13 NOTE — Consult Note (Signed)
[name], [name]             ACCOUNT NO.:  [id number]   MEDICAL RECORD NO.:  [id number]          PATIENT TYPE:  AMB   LOCATION:                                FACILITY:  [facility]   PHYSICIAN:  R. Roetta Sessions, M.D. DATE OF BIRTH:  1946-03-22   DATE OF CONSULTATION:  09/01/2005  DATE OF DISCHARGE:                                   CONSULTATION   GASTROENTEROLOGY CONSULTATION:   REQUESTING PHYSICIAN:  Angus McInnis   HISTORY OF PRESENT ILLNESS:  [name] is a 65 year old female patient of  Dr. Renard Matter who presents today for further evaluation of hematochezia and  right-sided abdominal pain.  She states she has had these symptoms on and  off for more than a couple years.  She has constant right upper quadrant  abdominal pain with episodes of increased severity at times.  Sometimes the  pain radiates into her back.  Sometimes associated with foods.  Last week  the abdominal pain started in the right upper quadrant and went down into  her lower abdomen.  She tends to have constipation.  She passes small  __________ stools.  Last week she had large volume red blood per rectum  after passing a hard stool.  Denies any episodes of diarrhea.  Over the  weekend she has had significant abdominal spasms.  She generally has a bowel  movement approximately two times a week.  Sometimes the stool is watery but  she never has more than one a day.  Denies any melena.  She occasionally has  heartburn.  She has nausea but no vomiting.  Her appetite is poor.  No  dysphagia, odynophagia.  She also has a history of right renal stones and  states that these symptoms are quite different than the pain she is having  in her right upper quadrant.  This is usually associated with hematuria as  well.  She has never had an EGD or colonoscopy.   CURRENT MEDICATIONS:  1.  Duragesic patch 75 mcg change every 3 days.  2.  Pangest MT 16 two daily.  3.  Theo-24 400 mg daily.  4.  Nexium 40 mg daily p.r.n.  5.   Tylenol Extra Strength 500 mg four a day as needed.  6.  Ativan 0.5 mg t.i.d.  7.  Combivent p.r.n.  8.  Cymbalta 60 mg daily.  9.  Advair 100/50 one puff twice daily.  10. NitroQuick p.r.n.   ALLERGIES:  IBUPROFEN causes severe swelling.  IVP DYE caused severe  swelling.   PAST MEDICAL HISTORY:  History of cervical cancer status post partial  hysterectomy; seasonal allergies; chronic back pain; COPD;  anxiety/depression; gastroesophageal reflux disease; heart disease;  nephrolithiasis.  Status post appendectomy, partial hysterectomy, recent  cystoscopy by Dr. Rito Ehrlich.  Bilateral retrograde pyelogram was planned but  due to her IV CONTRAST ALLERGY this was not done.  She has also been found  to have chronic calcific pancreatitis on prior noncontrast CT.  No history  of acute pancreatitis episodes in the past.  She does have a sister and a  cousin who both have  had pancreatitis unrelated to alcohol.   FAMILY HISTORY:  Mother is 1, has heart disease; father died at age 81, had  lymphoma and a history of MI.  She had aunt with lung and breast cancer.  A  sister and a cousin with history of pancreatitis, no alcohol use.  No family  history of colon cancer.   SOCIAL HISTORY:  She is divorced, has three children.  She is disabled.  She  smokes a pack of cigarettes daily.  She has not consumed any alcohol in over  12 years.  She did drink alcohol on the weekends in the past.   REVIEW OF SYSTEMS:  GI:  See HPI.  CARDIOPULMONARY:  No chest pain or  shortness of breath.  GENITOURINARY:  No hematuria or dysuria.   PHYSICAL EXAMINATION:  VITAL SIGNS:  Weight 90-1/2 pounds, height 5 feet 5  inches, temperature 97.9, blood pressure 98/60, pulse 72.  GENERAL:  Pleasant thin Caucasian female in no acute distress.  SKIN:  Warm and dry.  No jaundice.  HEENT:  Pupils equal, round and reactive to light.  Conjunctivae are pink.  Sclerae nonicteric.  Oropharyngeal moist and pink.  No lesions,  erythema, or  exudate.  No lymphadenopathy/thyromegaly.  CHEST:  Lungs are clear to auscultation.  Cardiac exam reveals regular rate  and rhythm.  Normal S1, S2.  No murmurs, rubs, or gallops.  ABDOMEN:  Positive bowel sounds.  Flat and nondistended.  Mild right upper  quadrant tenderness to deep palpation.  No organomegaly or masses.  No  rebound tenderness or guarding.  No abdominal bruits or hernias.  EXTREMITIES:  No edema.   STUDIES:  A CT of the abdomen and pelvis without contrast on June 22, 2005 revealed no evidence of renal or urethral calculi nor hydronephrosis,  chronic calcific pancreatitis.  Abdominal ultrasound August 2006 was  negative for gallbladder disease, stable calcification within the head of  the pancreas, mild right-sided hydronephrosis and small renal stones.  On  August 31, 2005, hematocrit was 40%.   IMPRESSION:  Ms. Dowd is a 65 year old lady who has chronic right upper  quadrant abdominal pain.  She also has frequent nausea and vomiting,  gastroesophageal reflux disease and recently developed hematochezia.  Workup  thus far has been focused at her history of nephrolithiasis.  She does not  have any gallstones on recent ultrasound.  Unfortunately her CTs have been  noncontrast given her history of IVP DYE allergy.  She has chronic calcific  pancreatitis on CT.  It is not clear if this is the cause of any of her  symptoms given her pain is primarily in the right upper quadrant.  Cannot  exclude biliary dyskinesia peptic ulcer disease.  She recently had  hematochezia which may be due to a benign anorectal source but she has never  had a colonoscopy therefore needs to have one for further evaluation.  Chronic gastroesophageal reflux disease without any prior upper endoscopy.   PLAN:  Colonoscopy and EGD in the near future.  LFTs, amylase and lipase.  If above workup is unremarkable would consider HIDA scan with fatty meal  challenge as a next  step.  I would like to thank Dr. Butch Penny for allowing [place] to take part in the  care of this patient.      Tana Coast, P.AJonathon Bellows, M.D.  Electronically Signed    LL/MEDQ  D:  09/01/2005  T:  09/01/2005  Job:  [id number]   cc:   Angus G. Renard Matter, MD  Fax: [phone]

## 2011-02-13 NOTE — Op Note (Signed)
NAMEBELLADONNA, LUBINSKI             ACCOUNT NO.:  [id number]   MEDICAL RECORD NO.:  [id number]          PATIENT TYPE:  AMB   LOCATION:  DAY                           FACILITY:  [facility]   PHYSICIAN:  Dennie Maizes, M.D.   DATE OF BIRTH:  Jun 12, 1946   DATE OF PROCEDURE:  09/16/2006  DATE OF DISCHARGE:                               OPERATIVE REPORT   PREOPERATIVE DIAGNOSIS:  Hematuria.   POSTOPERATIVE DIAGNOSIS:  Hematuria, normal bladder.   OPERATIVE PROCEDURE:  Cystoscopy.   ANESTHESIA:  General.   SURGEON:  Hollice Espy, M.D.   COMPLICATIONS:  None.   INDICATIONS FOR PROCEDURE:  This 65 year old female has intermittent  mild hematuria.  X-rays were negative for significant lesions.  Urine  cytology, urine culture and sensitivity were negative.  The patient was  taken to the operating room today for cystoscopy to rule out bladder  lesions.   DESCRIPTION OF PROCEDURE:  General anesthesia was induced, and the  patient was placed on the OR table in the dorsolithotomy position.  The  lower abdomen and genitalia were prepped and draped in a sterile  fashion.  Cystoscopy was done with the 25-French scope.  The trigone and  ureteral orifices were normal.  There were mild trabeculations of the  bladder mucosa.  There was no other abnormality.  The bladder capacity  was normal.  There was no evidence of any inflammation, tumor, or  foreign body in the bladder.  The instruments were removed.  The patient  was transferred to the PACU in a satisfactory condition.      Dennie Maizes, M.D.  Electronically Signed     SK/MEDQ  D:  09/16/2006  T:  09/16/2006  Job:  [id number]   cc:   Catalina Pizza, M.D.  Fax: [phone]

## 2011-02-13 NOTE — Patient Instructions (Addendum)
Your physician recommends that you schedule a follow-up appointment in:1 year Increased caloric intake as previously discussed

## 2011-02-13 NOTE — Consult Note (Signed)
NAME:  [name], [name]                       ACCOUNT NO.:  [id number]   MEDICAL RECORD NO.:  [id number]                   PATIENT TYPE:   LOCATION:                                       FACILITY:   PHYSICIAN:  Aundra Dubin, M.D.            DATE OF BIRTH:   DATE OF CONSULTATION:  12/28/2002  DATE OF DISCHARGE:                                   CONSULTATION   CHIEF COMPLAINT:  Back pain.   HISTORY OF PRESENT ILLNESS:  [name] is a 65 year old white female with  severe COPD, that I worked with from 8/02 and last saw in 12/02.  At that  time, I described her problem as being nonspecific back pain.  She went  through some physical therapy without much improvement.  There were also  elements of insomnia and arthralgia, but suggested in addition, a  fibromyalgia-type process.   At this time, she reports that she was having similar pain to the right  scapula and going down the paraspinous muscles and then across the low back.  She has difficulty describing it and can only say, It is just painful.  There have no swollen joints.  She is also hurting from her knees downward  and there has been no trauma.  She has recently fractured her finger with  slight movement.  She has osteoporosis and is on Fosamax.   She reports being tired all of the time and sleep is nonrestorative even  with some of the medicines she uses.  She has headaches a couple times a  week, which can be mild to moderate.  She has constipation, but denies  diarrhea, blood or mucus to the bowel movement.  There has been no numbness  or tingling to joints.  She has not felt the joints have been warm.  She has  been treated with prednisone for lung problems along the way, but she did  not feel that this changed her pain.   PAST MEDICAL HISTORY/SURGICAL HISTORY:  COPD.  I have again reviewed her x-  rays.  She had a portable x-ray in early January that showed chronic  bronchitis-type changes with hyperinflation.   Episodes of kidney stones,  hypertension, appendectomy, hysterectomy, hypercholesterolemia.   MEDICATIONS:  Advair Diskus 2 per day, Prozac 60 mg daily, lorazepam 5 mg  t.i.d., Lipitor 10 mg daily, Norvasc 5 mg daily, nitroglycerin p.r.n., stool  softener, calcium 500 mg with vitamin D b.i.d., trazodone 25 mg h.s.,  Flexeril 5 mg t.i.d., Mavik 7.5 mg b.i.d., Theo-Dur daily, Fosamax 70 mg  daily, Duragesic patch 25/50 mcg q.3 days.   ALLERGIES:  IBUPROFEN, CONTRAST DYE, CELEBREX.   FAMILY HISTORY:  Father had some type of cancer and died at age 50.  He also  had heart problems.  Mother had arthritis and heart problems and died at age  52.   SOCIAL HISTORY:  She continues to live alone.  She has three  children and  has grandchildren.  She is divorced.  She completed the 11th grade.  She is  unemployed.  She continues to smoke 1/2-3/4 of a pack of cigarettes per day.  No alcohol.   PHYSICAL EXAMINATION:  GENERAL:  This is a woman who appears older than her  stated age.  Weight 101 pounds.  VITAL SIGNS:  Blood pressure 110/64, respirations 16.  SKIN:  Sallow from chronic cigarette exposure.  HEENT:  The right eye deviates medially.  EOMI.  Mouth:  Dentures, no  obvious ulcers or petechiae.  NECK:  Negative JVD, no adenopathy.  LUNGS:  Very quiet sounds.  No wheeze.  HEART:  Regular, no murmur.  ABDOMEN:  Negative hepatosplenomegaly.  MUSCULOSKELETAL:  Hands and wrist show no swelling and are cool and  nontender.  Wrists, elbows and shoulders move well, but have mild stiffness.  Trigger points around the shoulder and neck  were tender.  Palpation down  the right paraspinous muscles are quite tender.  She has moderately poor  capillary refill.  Palpation over the vertebrae, are tender and palpation  along the left paraspinous muscles is less tender.  Hips, good range of  motion.  The knees, ankles and feet have a good range of motion, no active  arthritis.  NEUROLOGIC:  She has very  brisk knee deep tendon reflexes.  The ankles,  also, are rated at 2+ and the biceps at 1+.  Strength is 5/5.  Negative  SLR's, A&O x3, sensation intact.   ASSESSMENT/PLAN:  1. Back pain.  She continues with a very similar pattern as she has for many     years.  The exam is not very revealing, except she is tender more on the     right side of the back, along the paraspinous muscles.  I think she is on     a good set of medicines, both to help treat pain and for the insomnia.  I     have given her a prescription for Ultram 50 mg b-t.i.d. p.r.n.  She might     take Tylenol with the Tramadol.  I have explained to her, I do not     believe that this is going to make her pain-free, but I hope that it will     take some of the further edge off her pain.  2. Chronic obstructive pulmonary disease/smoking.  I also wonder if some of     her process is related to the long-term heavy smoking.  I will have her     go for a chest x-ray and will check labs to include a CBC, C-met and ESR.  3. Leg pain.  This is vague to me.  There is no tenderness in these areas.     Hopefully, the Ultram will help some with this.  This may be a     fibromyalgia-type aching.   DISPOSITION:  She will return in six weeks.                                                Aundra Dubin, M.D.    WWT/MEDQ  D:  12/28/2002  T:  12/29/2002  Job:  [id number]   cc:   Angus G. Renard Matter, M.D.  [address]  [place]  [place] [address]  Fax: [phone]

## 2011-02-13 NOTE — Discharge Summary (Signed)
[name], [name]             ACCOUNT NO.:  [id number]   MEDICAL RECORD NO.:  [id number]          PATIENT TYPE:  INP   LOCATION:  A332                          FACILITY:  [facility]   PHYSICIAN:  Angus G. Renard Matter, MD   DATE OF BIRTH:  1946/06/06   DATE OF ADMISSION:  09/13/2005  DATE OF DISCHARGE:  12/19/2006LH                                 DISCHARGE SUMMARY   DIAGNOSES:  1.  Gastrointestinal bleeding, post polypectomy bleed.  2.  History of chronic obstructive pulmonary disease.  3.  Coronary artery disease.  4.  External hemorrhoids.  5.  Hyperlipidemia.  6.  Long-standing history of intermittent bright red blood per rectum.  She      had a colonoscopy in December 2006 which revealed 0.75 cm polyp at      splenic flexure and 1.25 cm hepatic flexure.  These polyps were removed      by snare polypectomy.  Most recent hemoglobin November 14 was 13 g.  She      presented to the emergency department with bright red blood per rectum.      Had two bowel movements since her polypectomy.  She described the blood      as a large quantity of blood.  Hemoglobin on admission was 9.8,      hematocrit 28.1.   LABORATORY DATA:  CBC on admission:  WBC 10,700, hemoglobin 9.8, hematocrit  28.1.  Subsequent CBC on September 14, 2005, WBC 5800, hemoglobin 8.8,  hematocrit 25.2.  Following transfusion, hemoglobin was 10.5, hematocrit  30.1. Chemistries on admission, sodium 140, potassium 3.2, chloride 104, CO2  28, glucose 112, BUN 8, creatinine 0.6.  Subsequent chemistries on September 15, 2005, sodium 141, potassium 3.2, chloride 109, CO2 27, glucose 95, BUN  2, creatinine 0.6.  Liver enzymes:  SGOT 15, SGPT 8, alk-phos 66.  EKG  interpretation:  Abnormal EKG.  PAC's with aberrant conduction.   HOSPITAL COURSE:  The patient, at the time of her admission, was placed on  normal saline 125 cc an hour.  She was typed and cross matched for blood,  given transfusion following admission.  Compression  stockings were applied.  Atrovent and albuterol nebulizer treatments were started q.6h.  She was  continued on Advair Diskus 25/50 one b.i.d., Lipitor 10 mg daily, Duragesic  patch 50 mcg every three days, p.r.n. nitroglycerin, Prozac 60 mg daily,  Xanax 0.25 mg b.i.d.  She was seen in consultation by gastroenterology  service.  Transfusions were continued.  This patient might have late  postpolypectomy bleed.  She stopped bleeding spontaneously.  It was felt  there was no need for repeat colonoscopy.  She had what was felt to be late  postpolypectomy bleed.  Hemoglobin and hematocrit returned to a more normal  range.  The patient became relatively asymptomatic and was able to be  discharged after two days hospitalization to be followed as an outpatient.  She was advised to continue home medications.   DISCHARGE MEDICATIONS:  1.  Advair Diskus 250/50 b.i.d.  2.  Norvasc 5 mg daily.  3.  Duragesic patch q.4  days.  4.  P.r.n. nitroglycerin.  5.  Fosamax weekly.  6.  Theo-Dur 10 mg daily.   CONDITION ON DISCHARGE:  Stable.      Angus G. Renard Matter, MD  Electronically Signed     AGM/MEDQ  D:  10/13/2005  T:  10/13/2005  Job:  [id number]

## 2011-02-13 NOTE — Assessment & Plan Note (Signed)
Chest discomfort as though quite infrequent and tolerable.  Chronic fatigue and generalized weakness along with continuing weight loss and malnutrition appear to be her principal problems.  These are currently managed by her gastroenterologist and primary care physician.  We have made suggestions to her about increasing caloric intake and maintaining her weight.  No further cardiology assessment or treatment is required at present.  I will reassess this nice woman in one year.

## 2011-02-13 NOTE — Group Therapy Note (Signed)
[name], [name]             ACCOUNT NO.:  [id number]   MEDICAL RECORD NO.:  [id number]          PATIENT TYPE:  INP   LOCATION:  A332                          FACILITY:  [facility]   PHYSICIAN:  Angus G. Renard Matter, MD   DATE OF BIRTH:  [birth year]   DATE OF PROCEDURE:  09/15/2005  DATE OF DISCHARGE:  09/15/2005                                   PROGRESS NOTE   SUBJECTIVE:  This patient was admitted with gastrointestinal bleeding and  passed clots of bright red blood.  She did have a unit of packed RBC's  yesterday.  Her hemoglobin now is 10.5, hematocrit 30.  She also received  runs of K-Ciel to correct hypokalemia.  B-met is reordered for this morning.   OBJECTIVE:  VITAL SIGNS:  Blood pressure 101/56, respirations 18, pulse 71,  temperature 97.9.  LUNGS:  Clear to P&A.  HEART:  Regular rhythm.  ABDOMEN:  No palpable organs or masses.   ASSESSMENT:  The patient was admitted with history of having had GI bleed.  Her condition remained stable.   PLAN:  Continue current regimen.  Will await results on B-met.  The patient  is being seen by Dr. Karilyn Cota, Gastroenterology.  Felt that she might have  late post polypectomy bleed and that she has stopped bleeding spontaneously.  Continue current regimen.      Angus G. Renard Matter, MD  Electronically Signed     AGM/MEDQ  D:  09/15/2005  T:  09/16/2005  Job:  [id number]

## 2011-02-13 NOTE — Op Note (Signed)
[name], [name]             ACCOUNT NO.:  [id number]   MEDICAL RECORD NO.:  [id number]          PATIENT TYPE:  AMB   LOCATION:  DAY                           FACILITY:  [facility]   PHYSICIAN:  R. Roetta Sessions, M.D. DATE OF BIRTH:  Jul 28, 1946   DATE OF PROCEDURE:  09/08/2005  DATE OF DISCHARGE:                                 OPERATIVE REPORT   PROCEDURE:  Diagnostic esophagogastroduodenoscopy followed by colonoscopy  and snare polypectomy.   INDICATIONS FOR PROCEDURE:  The patient is a 65 year old lady with right  sided abdominal pain, long-standing gastroesophageal reflux disease,  intermittent hematochezia. EGD and colonoscopy are now being done. This  approach has been discussed with the patient at length. Potential risks,  benefits, and alternatives have been reviewed and questions answered. She is  agreeable. Please see documentation in the medical record.   PROCEDURE NOTE:  O2 saturation, blood pressure, pulse, and respirations were  monitored throughout the entirety of both procedures. Conscious sedation  with IV Versed and Demerol in incremental doses. Cetacaine spray for topical  oropharyngeal anesthesia.   INSTRUMENT:  Olympus video chip system.   FINDINGS:  Esophagogastroduodenoscopy:  Examination of the tubular esophagus  revealed no mucosal abnormalities. EG junction easily traversed.   Stomach:  Gastric cavity was empty and insufflated well with air. Thorough  examination of gastric mucosa including retroflexed view of the proximal  stomach and esophagogastric junction demonstrated a 1-cm nodule on the  posterior gastric wall of the lesser curvature that appeared to be more  submucosal. Did not obviously appear to be any other than benign-appearing  lesion. Pylorus patent and easily traversed. Examination of bulb and second  portion appeared normal.   THERAPEUTIC/DIAGNOSTIC MANEUVERS:  The nodule on the lesser curvature of the  posterior wall was biopsied for  histologic study. The patient tolerated the  procedure well and was prepared for colonoscopy. Digital rectal revealed no  abnormalities, although she did have external hemorrhoids.   ENDOSCOPIC FINDINGS:  Prep was adequate.   Rectum:  Examination of the rectal mucosa including retroflexed view of the  anal verge revealed no abnormalities.   Colon:  Colonic mucosa was surveyed from the rectosigmoid junction through  the left, transverse, and right colon to the area of the appendiceal  orifice, ileocecal valve, and cecum. These structures were well seen and  photographed for the record. From this level, the scope was slowly  withdrawn, and all previously mentioned mucosal surfaces were again seen.  The patient had multiple polyps. She had two pedunculated 0.75-cm polyps at  the splenic flexure. There was a third 0.75-cm polyp at the hepatic flexure  as well as a 1.25-cm polyp same location. These polyps were removed with  snare cautery and recovered through the scope. The remainder of the colonic  mucosa appeared normal. The patient tolerated both procedures well and was  reactive to endoscopy.   IMPRESSION:  ESOPHAGOGASTRODUODENOSCOPY:  1.  Normal esophagus.  2.  Gastric nodule, lesser curvature, of doubtful clinical significance,      biopsied. Remainder of stomach appeared normal. Patent pylorus. Normal  D1 and D2.   COLONOSCOPY FINDINGS:  1.  External hemorrhoids. Otherwise normal rectum.  2.  Multiple polyps at the hepatic and splenic flexure removed with snare      cautery. The remainder of the colonic mucosa appeared normal.   RECOMMENDATIONS:  1.  No aspirin or arthritis medications for the next 10 days.  2.  Today's findings do not explain patient's abdominal pain. She has likely      bleed from hemorrhoids. Hemorrhoid literature provided to Ms. Faries.  3.  Follow up on pathology.  4.  Ten-day course of Anusol HC suppositories 1 per rectum at bedtime.  5.  She is to  continue Nexium 40 mg orally daily for gastroesophageal reflux      disease. She is urged to stop smoking.  6.  Will proceed with a HIDA with fatty meal challenge to further evaluate      gallbladder.      Jonathon Bellows, M.D.  Electronically Signed     RMR/MEDQ  D:  09/08/2005  T:  09/08/2005  Job:  [id number]   cc:   Angus G. Renard Matter, MD  Fax: [phone]

## 2011-02-13 NOTE — Procedures (Signed)
[name], [name]             ACCOUNT NO.:  [id number]   MEDICAL RECORD NO.:  [id number]           PATIENT TYPE:  INP   LOCATION:  A227                          FACILITY:  [facility]   PHYSICIAN:  Edward L. Juanetta Gosling, M.D.DATE OF BIRTH:  1945-10-18   DATE OF PROCEDURE:  09/13/2005  DATE OF DISCHARGE:                                EKG INTERPRETATION   TIME:  4010 on September 13, 2005   RESULTS:  The rhythm is sinus rhythm.  There are what are called PACs with  aberrant conduction by the computer, but these could represent PVCs.  There  are nonspecific ST abnormalities diffusely.   IMPRESSION:  Abnormal electrocardiogram.      Edward L. Juanetta Gosling, M.D.  Electronically Signed     ELH/MEDQ  D:  09/13/2005  T:  09/14/2005  Job:  [id number]

## 2011-02-13 NOTE — H&P (Signed)
NAMELEATHER, [name]             ACCOUNT NO.:  [id number]   MEDICAL RECORD NO.:  [id number]          PATIENT TYPE:  AMB   LOCATION:  DAY                           FACILITY:  [facility]   PHYSICIAN:  Dennie Maizes, M.D.   DATE OF BIRTH:  [birth year]   DATE OF ADMISSION:  08/13/2005  DATE OF DISCHARGE:  LH                                HISTORY & PHYSICAL   CHIEF COMPLAINT:  Left flank pain, right upper quadrant abdominal pain,  microhematuria.   HISTORY OF PRESENT ILLNESS:  This 65 year old female was referred to me by  Dr. Renard Matter. She had been having intermittent right flank pain and right  upper quadrant abdominal pain for several weeks. She also had nausea and  vomiting and has mildly intermittent hematuria. Ultrasound of the abdomen  revealed absence of gallbladder disease. She has allergy to IV contrast.   Noncontrast CT scan of the abdomen and pelvis revealed no evidence of  urinary calculi, obstruction, or renal masses. The patient had pancreatic  calcifications. Urine culture and sensitivity was negative for growth. The  patient is brought to the hospital today for cystoscopy and bilateral  retrograde pyelograms for further evaluation. She did not have any fevers,  chills, or dysuria at present. She has a history of voiding difficulty in  the past. She has undergone cystoscopy and ureteral dilation on multiple  occasions.   PAST MEDICAL HISTORY:  History of coronary artery disease, chronic back  pain, COPD, anxiety disorder.   MEDICATIONS:  Nitroglycerin p.r.n. chest pain, pain pills.   ALLERGIES:  IV CONTRAST and IBUPROFEN.   PHYSICAL EXAMINATION:  HEENT:  Normal.  NECK:  No masses.  LUNGS:  Clear to auscultation.  HEART:  Regular rate and rhythm. No murmurs.  ABDOMEN:  Soft. No palpable flank mass. Mild right upper quadrant abdominal  tenderness is noted. Bladder not palpable. No suprapubic tenderness.   IMPRESSION:  Microhematuria, abdominal pain.   PLAN:   Cystoscopy and bilateral retrograde pyelograms in the hospital. The  patient may need ureteral dilation. I explained to her regarding the  diagnosis, operative details, alternative treatments, outcome, possible  risks and complications, and she has agreed for the procedure to be done.      Dennie Maizes, M.D.  Electronically Signed     SK/MEDQ  D:  08/12/2005  T:  08/13/2005  Job:  [id number]   cc:   Angus G. Renard Matter, MD  Fax: [phone]   [facility]

## 2011-02-13 NOTE — Consult Note (Signed)
[facility]  Patient:    [name], [name] Visit Number: [id number] MRN: [id number]          Service Type: RHE Location: [facility] Attending Physician:  Aundra Dubin Dictated by:   Aundra Dubin, M.D. Proc. Date: 09/08/01 Admit Date:  09/08/2001   CC:         Butch Penny, M.D.   Consultation Report  CHIEF COMPLAINT:  Back pain.  HISTORY OF PRESENT ILLNESS:  [name] did go to the physical therapy for one visit as recommended.  She is doing some range of motion at home and finds that this helps with the stiffness.  However, her overall level of pain is "the same."  She aches all through the neck and back.  She also aches from her knees down.  There have been no swollen joints.  Her weight is up 2 more pounds.  She continues to smoke.  She has some cough but denies significant shortness of breath.  MEDICATIONS:  1. Flexeril 10 mg 1/2 h.s.  2. Uniphyl 400 mg q.d.  3. Advair Diskus q.d.  4. Prozac 40 mg q.d.  5. Lorazepam 0.5 mg b.i.d.  6. Lipitor 10 mg q.d.  7. Norvasc 5 mg q.d.  8. Hydrocodone 3 to 4 q.d.  9. Mobic 7.5 mg q.d. 10. Clarinex 5 mg q.d.  PHYSICAL EXAMINATION:  VITAL SIGNS:  Weight 93 pounds.  Blood pressure 78/58, respirations 16.  GENERAL:  No distress.  LUNGS:  Very quiet sounds but no crackles.  HEART:  Regular.  MUSCULOSKELETAL:  Hands wrists, elbows, shoulders good range of motion with bilateral shoulder stiffness.  Trigger points had mild tenderness around the shoulder, neck, occiput, and anterior chest.  Palpation over the thoracic vertebrae is nontender.  She is tender below the scapula in the paraspinous muscles bilaterally.  Hips, knees, ankles, and feet have a good range of motion.  I saw no synovitis.  There is no guarding with the straight leg lift.   ASSESSMENT/PLAN:  Back ache, nonspecific.  I doubt there is more that I can do for her.  I believe the medicine she is on is appropriate to take  care of the amount of pain that she has.  The Flexeril has helped a little bit with her sleep, and this might be a medicine that could be adjusted or changed in the future.  I have little more to offer her, and I will see her back on a p.r.n. basis. Dictated by:   Aundra Dubin, M.D. Attending Physician:  Aundra Dubin DD:  09/08/01 TD:  09/08/01 Job: [id number]

## 2011-02-13 NOTE — Consult Note (Signed)
NAME:  [name], [name]                      ACCOUNT NO.:  [id number]   MEDICAL RECORD NO.:  [id number]                   PATIENT TYPE:   LOCATION:                                       FACILITY:   PHYSICIAN:  Aundra Dubin, M.D.            DATE OF BIRTH:   DATE OF CONSULTATION:  02/15/2003  DATE OF DISCHARGE:                                   CONSULTATION   CHIEF COMPLAINT:  Back pain, left hip pain.   HISTORY OF PRESENT ILLNESS:  This patient is a 65 year old woman who has  been a life-long smoker.  We had her go for a chest x-ray on 12/28/2002  which found a vague density.  This was followed up with a CT scan and the  area of note was not identified on the CT scan.  The report says that no  lung nodules, infiltrates, or effusions are present bilaterally.  She did  have COPD changes and also a large hiatal hernia. Her weight is stable, and  she is down 2 pounds. She is now reporting aching to the lateral left hip  for about 2 weeks. She remembers no trauma to this.  It hurts with walking.  Her pain medicines have been adjusted by Dr. Renard Matter recently.  She is still  using some of the Ultram.   MEDICINES:  1. Ultram b.i.d.  2. Fosamax 70 mg every week.  3. Duragesic 25 mg patch daily.  4. Mobic 7.5 mg b.i.d.  5. Flexeril 5 mg t.i.d.  6. Trazodone 25 mg h.s.  7. Calcium b.i.d.  8. Other medicines are unchanged from the note of 12/28/2002.   PHYSICAL EXAMINATION:  VITAL SIGNS:  Weight 99 pounds.  Blood pressure  120/70, respirations 16.  GENERAL:  She is thin and is in no distress.  Her gait has minor limping to  the left.  LUNGS:  Quiet but clear.  HEART:  Regular no murmurs.  EXTREMITIES:  Lower extremities no edema.  MUSCULOSKELETAL:  The hands do not show significant swelling.  Wrists,  elbows, shoulders good range of motion.  Movement of the left hip in an  internal direction finds that she hurts laterally at the trochanteric bursa  area.  Palpation to the  soft tissue of the trochanteric bursa area was  nontender.  She was tender over the bony prominence.  Knees, ankles and feet  were unremarkable.   ASSESSMENT AND PLAN:  1. Polyarthralgia.  I wonder if this is an inflammatory-type process.  I     have elected to give her 120 mg of Depo-Medrol IM and then have her take     prednisone 20 mg daily.  I will see her back in 1 week.  2.     Left hip pain.  We will have the left hip x-rayed.  3. Chronic obstructive pulmonary disease.  4. She will return in 1 week.  Aundra Dubin, M.D.    WWT/MEDQ  D:  02/15/2003  T:  02/15/2003  Job:  [id number]   cc:   Angus G. Renard Matter, M.D.  [address]  [place]  [place] [address]  Fax: [phone]

## 2011-02-13 NOTE — H&P (Signed)
NAME:  [name], [name]                       ACCOUNT NO.:  [id number]   MEDICAL RECORD NO.:  [id number]                   PATIENT TYPE:  INP   LOCATION:  A325                                 FACILITY:  [facility]   PHYSICIAN:  Melvyn Novas, MD        DATE OF BIRTH:  03-Mar-1946   DATE OF ADMISSION:  05/22/2004  DATE OF DISCHARGE:                                HISTORY & PHYSICAL   HISTORY OF PRESENT ILLNESS:  The patient is a 65 year old white female,  patient of Dr. Renard Matter, who has a 24-hour history of recurrent nausea and  vomiting green, bilious material.  She complains of some bilateral lower-  abdominal cramping but denies any right upper quadrant pain.  She had some  diarrhea for 24 hours, but denies any hematemesis, melena or hematochezia.  She has also specifically denied any anginal chest pain, orthopnea or PND.  The patient had a normal exercise test two years ago for suspected angina.  She is admitted for a diagnosis of volume depletion, gastroenteritis and  consideration of gallbladder dysfunction despite absence of right upper  quadrant pain.   PAST MEDICAL HISTORY:  Significant for hypertension, chronic low-back pain,  angina, hyperlipidemia and anxiety.   PAST SURGICAL HISTORY:  Remarkable for a vaginal hysterectomy and  appendectomy.   ALLERGIES:  IBUPROFEN and X-RAY DYE.   SOCIAL HISTORY:  She is disabled due to her back.  She smokes one pack per  day.  She lives alone with a 61 year old granddaughter.   CURRENT MEDICATIONS:  1. Advair 250/50, one puff every q.12h.  2. Norvasc 5 mg per day.  3. Lipitor 10 mg per day.  4. Duragesic patch 15 micrograms every 72 hours.  5. Ativan 1 mg h.s. p.r.n.  6. Sublingual nitroglycerin p.r.n.  7. Xanax 0.5 mg b.i.d. p.r.n.  8. Calcium.  9. Vitamin D daily.   PHYSICAL EXAMINATION:  VITAL SIGNS:  Blood pressure 156/98, temperature  99.3.  Respiratory rate 20.  Pulse 70.  HEENT:  Normocephalic, atraumatic.  Eyes:   PERRLA, extraocular muscles are  intact.  Sclerae are clear.  Conjunctivae are pink.  NECK:  Shows no jugular venous distension.  No carotid bruits, no  thyromegaly.  No carotid bruits.  LUNGS:  Clear to auscultation and percussion.  No rales, wheezes or rhonchi.  HEART:  Regular rhythm, S1, S2.  No murmurs, no S3, S4, gallops. No heaves,  thrills or rubs.  ABDOMEN:  Soft.  Bowel sounds are normoactive.  No peristaltic rushes.  No  right upper-quadrant pain to palpation.  No masses detectable.  EXTREMITIES:  No cyanosis, clubbing or edema.  NEUROLOGIC:  Cranial nerves II-XII are grossly intact.  The patient moves  all four extremities.   IMPRESSION:  1. Gastroenteritis.  2. Dehydration, intravascular volume depletion.  3. No evidence of gallbladder dysfunction clinically.  4. Chronic obstructive pulmonary disease.  5. Hypertension.  6. Hyperlipidemia.   PLAN:  1. Admit.  2. Continue  Zofran and Phenergan.  3. Continue IV fluids.  4. Check serial electrolytes.  5. Consider CT scan of gallbladder or ultrasound if symptoms do not resolve.  6. We will make further recommendations as the data base expands.     ___________________________________________                                         Melvyn Novas, MD   RMD/MEDQ  D:  05/23/2004  T:  05/23/2004  Job:  [id number]

## 2011-02-13 NOTE — Consult Note (Signed)
NAME:  [name], [name]                       ACCOUNT NO.:  [id number]   MEDICAL RECORD NO.:  [id number]                   PATIENT TYPE:   LOCATION:                                       FACILITY:   PHYSICIAN:  Aundra Dubin, M.D.            DATE OF BIRTH:  1945/10/12   DATE OF CONSULTATION:  02/22/2003  DATE OF DISCHARGE:                                   CONSULTATION   CHIEF COMPLAINT:  Back pain, polyarthralgia, left hip pain.   REASON FOR CONSULTATION:  The patient returns reporting that after the  injection of 120 mg of Depo-Medrol and taking 20 mg of prednisone each day,  she is no better.  This has not helped the lateral hip pain or any of her  overall aching or back pain.  She did have the left hip x-rayed which did  not show a fracture and was normal appearing.  She is still having  considerable amount of pain in the left lateral hip area.  She says that it  catches when she walks and there is some limping.  This is about the same  since last week.  Her weight is up 2 pounds.   MEDICATIONS:  Unchanged from Feb 15, 2003.   PHYSICAL EXAMINATION:  VITAL SIGNS:  Weight 101 pounds, blood pressure  80/70, respirations 16.  FOCUS PHYSICAL EXAMINATION:  The hands, wrists, elbows, shoulders, and neck  have no swelling and are nontender.  Movement of the left hip is completely  normal.  Palpation at the left trochanteric bursa area is nontender.  She is  more tender in her muscle area superior and slightly posterior towards the  buttocks.  This is quite tender.  The knees, ankles, and feet have no  swelling and are nontender.   ASSESSMENT AND PLAN:  1. Back pain/polyarthralgia.  I wanted to see if prednisone would help this     type of achiness and it did not.  She will now lower the prednisone to 10     mg for two days, then 5 mg for two days, then stop the medicine.  2. Left hip area pain.  This is in a muscle area that is not as amenable to     an injection as the  trochanteric bursa is.  The pain seems to be clearly     above the bursitis area.  I will have her go to physical therapy to see     if heat, range of motion, and other modalities can help with this.  3. Smoking/chronic obstructive pulmonary disease.   If the physical therapy is not helping with the left hip pain she will make  another appointment.  Otherwise, I will see her back on a p.r.n. basis.  Aundra Dubin, M.D.    WWT/MEDQ  D:  02/22/2003  T:  02/22/2003  Job:  [id number]   cc:   Angus G. Renard Matter, M.D.  [address]  [place]  [place] [address]  Fax: [phone]

## 2011-02-13 NOTE — Op Note (Signed)
NAMEKATARINA, [name]             ACCOUNT NO.:  [id number]   MEDICAL RECORD NO.:  [id number]          PATIENT TYPE:  AMB   LOCATION:  DAY                           FACILITY:  [facility]   PHYSICIAN:  Dennie Maizes, M.D.   DATE OF BIRTH:  [birth year]   DATE OF PROCEDURE:  08/13/2005  DATE OF DISCHARGE:                                 OPERATIVE REPORT   PREOPERATIVE DIAGNOSIS:  Microhematuria, right flank and right upper  quadrant abdominal pain.   POSTOPERATIVE DIAGNOSIS:  Microhematuria, right flank and right upper  quadrant abdominal pain.   OPERATIVE PROCEDURE:  Cystoscopy.   ANESTHESIA:  General.   SURGEON:  Dr. Rito Ehrlich.   COMPLICATIONS:  None.   OPERATIVE FINDINGS:  Normal bladder.   SPECIMEN:  None.   INDICATIONS FOR PROCEDURE:  A 65 year old female who has chronic right flank  and right upper quadrant abdominal pain. She had a noncontrast CT scan of  abdomen and pelvis as well as renal ultrasound which was normal.  Microhematuria was noted. She was taken to the OR today for cystoscopy and  bilateral retrograde pyelograms. The patient had IV contrast allergy in the  past. She declined to have bilateral retrograde pyelograms done. This was  cancelled.   General anesthesia was induced, and the patient was placed on the OR table  in the dorsal lithotomy position. The lower abdomen and genitalia were  prepped and draped in a sterile fashion. Cystoscopy was noted with a 25-  [place]  scope. The appearance of the bladder was normal. There was no evidence of  any abnormality in the bladder. Bladder capacity was adequate. The  instruments were removed. There was no evidence of any ureteral stenosis.  Pelvic examination was negative. The patient was transferred to the PACU in  satisfactory condition.      Dennie Maizes, M.D.  Electronically Signed     SK/MEDQ  D:  08/13/2005  T:  08/13/2005  Job:  [id number]   cc:   Angus G. Renard Matter, MD  Fax: [phone]

## 2011-02-13 NOTE — Consult Note (Signed)
[name], [name]             ACCOUNT NO.:  [id number]   MEDICAL RECORD NO.:  [id number]          PATIENT TYPE:  INP   LOCATION:  A227                          FACILITY:  [facility]   PHYSICIAN:  Kassie Mends, M.D.      DATE OF BIRTH:  09-Jul-1946   DATE OF CONSULTATION:  09/13/2005  DATE OF DISCHARGE:                                   CONSULTATION   REQUESTING PHYSICIAN:  Calvert Cantor, M.D.   REASON FOR CONSULTATION:  Hematochezia.   HISTORY OF PRESENT ILLNESS:  [name] is a 65 year old white female with  a long standing history of intermittent bright red blood per rectum.  She  had a colonoscopy on September 08, 2005, which revealed 0.75 cm polyp at the  splenic flexure x2 and a 1.25 cm polyp at the hepatic flexure.  The polyps  were removed via snare polypectomy.  Her most recent hemoglobin on November  14 was 13.  She presented to the emergency department with bright red blood  per rectum x1.  She reports two bowel movements since her polypectomy, and  this was the first episode of bright red blood per rectum. She describes it  as a large quantity of blood.  She has had bleeding from her rectum before,  but it was not this bad.  On Friday, she was pushing and pulling furniture  and mopping and doing a lot of housework.  She arrived in the emergency  department via EMS.  Her arriving blood pressure was 94/55.  She has had no  recurrence of rectal bleeding since hospitalization.   PAST MEDICAL HISTORY:  1.  Chronic obstructive pulmonary disease.  2.  Coronary artery disease.  3.  External hemorrhoids and hyperlipidemia.   PAST SURGICAL HISTORY:  None.   ALLERGIES:  IBUPROFEN AND IVP DYE.   HOME MEDICATIONS:  1.  Norvasc.  2.  Duragesic patch.  3.  Nitroglycerin as needed.  4.  Ativan.  5.  Fosamax.  6.  Theo-Dur.  7.  Calcium carbonate with vitamin D.  8.  Antidepressant.  Her medication profile reveals the Duragesic patch, Advair and Zocor.   SOCIAL HISTORY:  She  does smoke but denies any alcohol use.   REVIEW OF SYSTEMS:  Chronic right upper quadrant pain.  Pain in the right  upper quadrant is no different than usual.  She initially had mild pain  around her naval which has now resolved.  She complains of back pain, loose  stools and weight loss.  She denies any chest pain or shortness of breath.  Review of systems is per the HPI or as previously mentioned.  Otherwise, all  systems are negative.   PHYSICAL EXAMINATION:  VITAL SIGNS:  Initial blood pressure was 68/58 after  fluid resuscitation.  Blood pressure 109/66.  GENERAL:  She is in no  apparent distress.  Alert and oriented x4.  HEENT:  Atraumatic,  normocephalic.  Pupils equal, round and reactive to light.  Mouth:  No oral  lesions.  NECK:  Full range of motion and no lymphadenopathy. LUNGS:  Clear  to auscultation bilaterally.  CARDIOVASCULAR:  Regular rhythm, no murmur.  Normal S1, S2.  ABDOMEN:  Bowel  sounds are present, soft, mild tenderness to palpation in the right upper  quadrant.  No rebound or guarding.  No hepatosplenomegaly.  EXTREMITIES:  Without cyanosis, clubbing or edema.  No focal neurological deficits.   LABORATORY DATA:  White count 9.4, hemoglobin 10.5 after two units of packed  red blood cells, platelets 255,000.  INR 1.1, potassium 3.8, BUN 6,  creatinine 0.5.   ASSESSMENT:  [name] is a 65 year old female with chronic right upper  quadrant pain and currently undergoing evaluation with Dr. Jena Gauss.  Her  symptoms are unchanged.  She has had bright red blood per rectum once, and  the differential diagnosis includes post polypectomy bleed versus  hemorrhoidal bleed.  Thank you for allowing me to see [name] in  consultation.  My list of recommendations follow.   RECOMMENDATIONS:  [name] has no acute need for colonoscopy.  Her  hematochezia has resolved.  I would advance her to a cardiac diet and  continue to check serial CBC's.  She should have no  aspirin, NSAIDS, or  anticoagulation for five days.      Kassie Mends, M.D.  Electronically Signed     SM/MEDQ  D:  09/13/2005  T:  09/14/2005  Job:  [id number]

## 2011-02-13 NOTE — Discharge Summary (Signed)
NAME:  UNDREA, ARCHBOLD                       ACCOUNT NO.:  [id number]   MEDICAL RECORD NO.:  [id number]                   PATIENT TYPE:  INP   LOCATION:  A325                                 FACILITY:  [facility]   PHYSICIAN:  Angus G. Renard Matter, M.D.              DATE OF BIRTH:  Aug 24, 1946   DATE OF ADMISSION:  05/22/2004  DATE OF DISCHARGE:  05/25/2004                                 DISCHARGE SUMMARY   A 65 year old white female admitted May 23, 2004, discharged May 25, 2004, two days hospitalization.   DIAGNOSES:  1.  Gastroenteritis.  2.  Hypovolemia.  3.  Hypertension.  4.  History of hyperlipidemia.   CONDITION ON DISCHARGE:  Stable and improved at time of discharge.   HISTORY:  This 65 year old white female had a history of recurrent nausea,  vomiting, green bilious material.  She also complained of bilateral lower  abdominal crampy pain.  Denied any right upper quadrant pain.  She had  diarrhea for 24 hours, but denied any hematemesis, melena, or hematochezia.  She also specifically denied any chest pain.  Is admitted with a diagnosis  of volume depletion, gastroenteritis.   PHYSICAL EXAMINATION:  GENERAL:  Alert female.  VITAL SIGNS:  Blood pressure 156/98, temperature 99.3, respirations 20,  pulse 70.  HEENT:  Eyes:  PERRLA.  TM negative.  Oropharynx benign.  NECK:  Supple.  No JVD or thyroid abnormalities.  LUNGS:  Clear to P&A.  HEART:  Regular rhythm.  No murmurs.  ABDOMEN:  No palpable organs or masses.  EXTREMITIES:  Free of edema.   LABORATORY DATA:  CBC:  WBC 12,400, hemoglobin 15.1, hematocrit 42.7.  Chemistries:  Sodium 139, potassium 3.4, chloride 107, CO2 23, glucose 147,  BUN 9, creatinine 0.7, calcium 10.0.  Subsequent chemistry on May 24, 2004:  Sodium 135, potassium 3.1, chloride 101, CO2 25, glucose 126, BUN 6,  creatinine 0.6, calcium 9.2.  Liver enzymes:  SGOT 30, SGPT 23, alkaline  phosphatase 121, bilirubin 0.8, lipase 41.  X-rays:   Chest x-ray:  COPD.  No  active lung disease.  Abdomen:  No bowel obstruction or free air noted.  CT  of the abdomen:  Coarse calcification pancreatic head, stable from previous  examination.  Negative for acute abnormality.  CT of the pelvis without  contrast.  Unremarkable CT of the pelvis.   HOSPITAL COURSE:  The patient at time of admission was placed on clear  liquid diet.  Phenergan 25 mg q.4h. p.r.n. for nausea.  She was continued on  Advair Diskus 5500 q.12h., Norvasc 5 mg daily, Lipitor 10 mg daily,  Duragesic patch 50 mcg q.72h., Ativan 1 mg at bedtime as needed for sleep,  p.r.n. nitroglycerin, Xanax 0.5 mg b.i.d.  She was given Zofran 4 mg q.8h.  as needed for nausea, IV Toradol 30 mg q.6h. p.r.n. for pain.  Patient  improved during hospital stay.  She had  CT of the abdomen which did not show  evidence of acute findings.  It was felt patient  did have episode of gastroenteritis with hypovolemia.  Her vital signs  remained stable.  She was able to be discharged after two days'  hospitalization.  Sent home on Norvasc 5 mg daily, Zocor 20 mg daily, Advair  Diskus 5500 b.i.d., Os-Cal one b.i.d., and Protonix 40 mg daily.     ___________________________________________                                         Ishmael Holter Renard Matter, M.D.   AGM/MEDQ  D:  06/09/2004  T:  06/09/2004  Job:  [id number]

## 2011-02-13 NOTE — H&P (Signed)
NAME:  [name], [name]                       ACCOUNT NO.:  [id number]   MEDICAL RECORD NO.:  [id number]                   PATIENT TYPE:  INP   LOCATION:  A325                                 FACILITY:  [facility]   PHYSICIAN:  Angus G. Renard Matter, M.D.              DATE OF BIRTH:  [birth year]   DATE OF ADMISSION:  05/22/2004  DATE OF DISCHARGE:                                HISTORY & PHYSICAL   HISTORY OF PRESENT ILLNESS:  This 65 year old female presented to the ED  with vomiting and abdominal cramps which had begun several hours prior to  admission.  Lab data with CBC showing WBC 12,400, hemoglobin 15.1,  hematocrit 42.7%.  Chemistries with sodium 139, potassium 3.4, chloride 107,  CO2 23, glucose 147, BUN 9, creatinine 0.7, calcium 10.0.  The patient in ED  was given Phenergan.  IV fluids were started and she was subsequently  admitted.   SOCIAL HISTORY:  The patient does not smoke or drink.   FAMILY HISTORY:  Noncontributory.   PAST MEDICAL HISTORY:  1. COPD.  2. Hyperlipidemia.   PAST SURGICAL HISTORY:  Hysterectomy some 20 years ago.   MEDICATIONS:  1. Advair Diskus daily.  2. Norvasc 5 mg daily.  3. Lipitor 10 mg daily.  4. Prozac 60 mg daily.  5. Xanax b.i.d.  6. Fosamax weekly at 70 mg.  7. Calcium with Vitamin D b.i.d.   ALLERGIES:  IVP DYE and IBUPROFEN.   REVIEW OF SYMPTOMS:  HEENT:  Negative.  CARDIOPULMONARY:  No cough or  dyspnea.  GASTROINTESTINAL:  Bouts of vomiting and abdominal cramps over  several hours.  GU:  No dysuria or hematuria.   PHYSICAL EXAMINATION:  GENERAL:  Uncomfortable female.  VITAL SIGNS:  Blood pressure 159/93, respirations 20, pulse 71.  HEENT:  PERRLA.  TMs negative.  Oropharynx benign.  NECK:  Supple with no JVD or thyroid abnormalities.  LUNGS:  Clear to P&A.  HEART:  Regular rhythm with no murmurs or cardiomegaly.  ABDOMEN:  Tenderness in mid abdomen.  No palpable organs or masses.  SKIN:  Warm and dry.  EXTREMITIES:  Free of  edema.   ASSESSMENT:  1. Abdominal pain and vomiting, acute onset.  2. History of chronic obstructive pulmonary disease.     ___________________________________________                                         Ishmael Holter. Renard Matter, M.D.   AGM/MEDQ  D:  05/23/2004  T:  05/23/2004  Job:  [id number]

## 2011-02-19 ENCOUNTER — Emergency Department (HOSPITAL_COMMUNITY)
Admission: EM | Admit: 2011-02-19 | Discharge: 2011-02-19 | Disposition: A | Discharge status: Home / Self Care | Payer: Medicare Other | Attending: Emergency Medicine | Admitting: Emergency Medicine

## 2011-02-19 ENCOUNTER — Emergency Department (HOSPITAL_COMMUNITY): Payer: Medicare Other

## 2011-02-19 DIAGNOSIS — I499 Cardiac arrhythmia, unspecified: Secondary | ICD-10-CM | POA: Insufficient documentation

## 2011-02-19 DIAGNOSIS — R42 Dizziness and giddiness: Secondary | ICD-10-CM | POA: Insufficient documentation

## 2011-02-19 DIAGNOSIS — Z87442 Personal history of urinary calculi: Secondary | ICD-10-CM | POA: Insufficient documentation

## 2011-02-19 DIAGNOSIS — R51 Headache: Secondary | ICD-10-CM | POA: Insufficient documentation

## 2011-02-19 DIAGNOSIS — F3289 Other specified depressive episodes: Secondary | ICD-10-CM | POA: Insufficient documentation

## 2011-02-19 DIAGNOSIS — F329 Major depressive disorder, single episode, unspecified: Secondary | ICD-10-CM | POA: Insufficient documentation

## 2011-02-19 DIAGNOSIS — R209 Unspecified disturbances of skin sensation: Secondary | ICD-10-CM | POA: Insufficient documentation

## 2011-02-19 LAB — COMPREHENSIVE METABOLIC PANEL
ALT: 9 U/L (ref 0–35)
BUN: 6 mg/dL (ref 6–23)
CO2: 33 mEq/L — ABNORMAL HIGH (ref 19–32)
Calcium: 9.3 mg/dL (ref 8.4–10.5)
Glucose, Bld: 99 mg/dL (ref 70–99)
Sodium: 139 mEq/L (ref 135–145)
Total Protein: 6.7 g/dL (ref 6.0–8.3)

## 2011-02-19 LAB — CBC
Hemoglobin: 12.8 g/dL (ref 12.0–15.0)
MCH: 31.6 pg (ref 26.0–34.0)
MCHC: 34 g/dL (ref 30.0–36.0)
Platelets: 273 10*3/uL (ref 150–400)
RDW: 12.4 % (ref 11.5–15.5)

## 2011-02-19 LAB — PROTIME-INR
INR: 1.04 (ref 0.00–1.49)
Prothrombin Time: 13.8 seconds (ref 11.6–15.2)

## 2011-02-19 LAB — TROPONIN I: Troponin I: <0.3 ng/mL (ref <0.30)

## 2011-02-19 LAB — CK TOTAL AND CKMB (NOT AT ARMC)
Relative Index: 2.8 — ABNORMAL HIGH (ref 0.0–2.5)
Total CK: 257 U/L — ABNORMAL HIGH (ref 7–177)

## 2011-03-02 ENCOUNTER — Other Ambulatory Visit: Payer: Self-pay | Admitting: Diagnostic Neuroimaging

## 2011-03-02 DIAGNOSIS — I6350 Cerebral infarction due to unspecified occlusion or stenosis of unspecified cerebral artery: Secondary | ICD-10-CM

## 2011-03-02 DIAGNOSIS — R42 Dizziness and giddiness: Secondary | ICD-10-CM

## 2011-03-02 DIAGNOSIS — R209 Unspecified disturbances of skin sensation: Secondary | ICD-10-CM

## 2011-03-02 DIAGNOSIS — G43909 Migraine, unspecified, not intractable, without status migrainosus: Secondary | ICD-10-CM

## 2011-03-10 ENCOUNTER — Ambulatory Visit
Admission: RE | Admit: 2011-03-10 | Discharge: 2011-03-10 | Disposition: A | Discharge status: Home / Self Care | Payer: Medicare Other | Source: Ambulatory Visit | Attending: Diagnostic Neuroimaging | Admitting: Diagnostic Neuroimaging

## 2011-03-10 DIAGNOSIS — R209 Unspecified disturbances of skin sensation: Secondary | ICD-10-CM

## 2011-03-10 DIAGNOSIS — I6350 Cerebral infarction due to unspecified occlusion or stenosis of unspecified cerebral artery: Secondary | ICD-10-CM

## 2011-03-10 DIAGNOSIS — G43909 Migraine, unspecified, not intractable, without status migrainosus: Secondary | ICD-10-CM

## 2011-03-10 DIAGNOSIS — R42 Dizziness and giddiness: Secondary | ICD-10-CM

## 2011-03-20 ENCOUNTER — Encounter: Payer: Self-pay | Admitting: Cardiology

## 2011-03-20 DIAGNOSIS — E785 Hyperlipidemia, unspecified: Secondary | ICD-10-CM | POA: Insufficient documentation

## 2011-05-09 ENCOUNTER — Other Ambulatory Visit: Payer: Self-pay | Admitting: Internal Medicine

## 2011-08-31 ENCOUNTER — Inpatient Hospital Stay (HOSPITAL_COMMUNITY)
Admission: EM | Admit: 2011-08-31 | Discharge: 2011-09-04 | DRG: 641 | Disposition: A | Discharge status: Home / Self Care | Payer: Medicare Other | Attending: Family Medicine | Admitting: Family Medicine

## 2011-08-31 ENCOUNTER — Emergency Department (HOSPITAL_COMMUNITY): Payer: Medicare Other

## 2011-08-31 ENCOUNTER — Encounter (HOSPITAL_COMMUNITY): Payer: Self-pay | Admitting: *Deleted

## 2011-08-31 ENCOUNTER — Other Ambulatory Visit: Payer: Self-pay

## 2011-08-31 DIAGNOSIS — F329 Major depressive disorder, single episode, unspecified: Secondary | ICD-10-CM | POA: Diagnosis present

## 2011-08-31 DIAGNOSIS — J3489 Other specified disorders of nose and nasal sinuses: Secondary | ICD-10-CM | POA: Diagnosis present

## 2011-08-31 DIAGNOSIS — E860 Dehydration: Secondary | ICD-10-CM

## 2011-08-31 DIAGNOSIS — F3289 Other specified depressive episodes: Secondary | ICD-10-CM | POA: Diagnosis present

## 2011-08-31 DIAGNOSIS — F411 Generalized anxiety disorder: Secondary | ICD-10-CM | POA: Diagnosis present

## 2011-08-31 DIAGNOSIS — E871 Hypo-osmolality and hyponatremia: Secondary | ICD-10-CM

## 2011-08-31 DIAGNOSIS — J4489 Other specified chronic obstructive pulmonary disease: Secondary | ICD-10-CM | POA: Diagnosis present

## 2011-08-31 DIAGNOSIS — J449 Chronic obstructive pulmonary disease, unspecified: Secondary | ICD-10-CM | POA: Diagnosis present

## 2011-08-31 DIAGNOSIS — G8929 Other chronic pain: Secondary | ICD-10-CM | POA: Diagnosis present

## 2011-08-31 DIAGNOSIS — M47817 Spondylosis without myelopathy or radiculopathy, lumbosacral region: Secondary | ICD-10-CM | POA: Diagnosis present

## 2011-08-31 DIAGNOSIS — I2510 Atherosclerotic heart disease of native coronary artery without angina pectoris: Secondary | ICD-10-CM | POA: Diagnosis present

## 2011-08-31 DIAGNOSIS — E876 Hypokalemia: Secondary | ICD-10-CM | POA: Diagnosis present

## 2011-08-31 DIAGNOSIS — R9431 Abnormal electrocardiogram [ECG] [EKG]: Secondary | ICD-10-CM

## 2011-08-31 DIAGNOSIS — K219 Gastro-esophageal reflux disease without esophagitis: Secondary | ICD-10-CM | POA: Diagnosis present

## 2011-08-31 DIAGNOSIS — K861 Other chronic pancreatitis: Secondary | ICD-10-CM | POA: Diagnosis present

## 2011-08-31 DIAGNOSIS — M47814 Spondylosis without myelopathy or radiculopathy, thoracic region: Secondary | ICD-10-CM | POA: Diagnosis present

## 2011-08-31 LAB — LACTIC ACID, PLASMA: Lactic Acid, Venous: 1 mmol/L (ref 0.5–2.2)

## 2011-08-31 LAB — CBC
MCH: 31.4 pg (ref 26.0–34.0)
MCHC: 35.1 g/dL (ref 30.0–36.0)
RDW: 12.8 % (ref 11.5–15.5)

## 2011-08-31 LAB — DIFFERENTIAL
Basophils Absolute: 0 10*3/uL (ref 0.0–0.1)
Basophils Relative: 0 % (ref 0–1)
Eosinophils Absolute: 0 10*3/uL (ref 0.0–0.7)
Monocytes Absolute: 0.4 10*3/uL (ref 0.1–1.0)
Monocytes Relative: 9 % (ref 3–12)
Neutro Abs: 4.3 10*3/uL (ref 1.7–7.7)
Neutrophils Relative %: 83 % — ABNORMAL HIGH (ref 43–77)

## 2011-08-31 LAB — URINALYSIS, ROUTINE W REFLEX MICROSCOPIC
Leukocytes, UA: NEGATIVE
Nitrite: NEGATIVE
Specific Gravity, Urine: >1.03 — ABNORMAL HIGH (ref 1.005–1.030)
Urobilinogen, UA: 0.2 mg/dL (ref 0.0–1.0)
pH: 5.5 (ref 5.0–8.0)

## 2011-08-31 LAB — URINE MICROSCOPIC-ADD ON

## 2011-08-31 LAB — COMPREHENSIVE METABOLIC PANEL
AST: 23 U/L (ref 0–37)
Albumin: 3.5 g/dL (ref 3.5–5.2)
BUN: 14 mg/dL (ref 6–23)
Chloride: 92 mEq/L — ABNORMAL LOW (ref 96–112)
Creatinine, Ser: 0.58 mg/dL (ref 0.50–1.10)
Potassium: 3.5 mEq/L (ref 3.5–5.1)
Total Bilirubin: 0.4 mg/dL (ref 0.3–1.2)
Total Protein: 6.7 g/dL (ref 6.0–8.3)

## 2011-08-31 LAB — LIPASE, BLOOD: Lipase: 15 U/L (ref 11–59)

## 2011-08-31 MED ORDER — SODIUM CHLORIDE 0.9 % IV BOLUS (SEPSIS)
500.0000 mL | Freq: Once | INTRAVENOUS | Status: AC
  Administered 2011-08-31: 500 mL via INTRAVENOUS

## 2011-08-31 MED ORDER — ASPIRIN 81 MG PO CHEW
324.0000 mg | CHEWABLE_TABLET | Freq: Once | ORAL | Status: AC
  Administered 2011-08-31: 324 mg via ORAL
  Filled 2011-08-31: qty 4

## 2011-08-31 MED ORDER — SODIUM CHLORIDE 0.9 % IV SOLN: Freq: Once | INTRAVENOUS | Status: DC

## 2011-08-31 MED ORDER — SODIUM CHLORIDE 0.9 % IV SOLN
INTRAVENOUS | Status: DC
  Administered 2011-08-31: 19:00:00 via INTRAVENOUS

## 2011-08-31 NOTE — ED Notes (Signed)
Attempted to call report, was told the nurse would have to call me back. 

## 2011-08-31 NOTE — ED Notes (Signed)
Pt states fllu-like symptoms x 2 -3 weeks and has been treated by PMD. Pt states vomiting began an hour ago.

## 2011-08-31 NOTE — ED Provider Notes (Signed)
History     CSN: [id number] Arrival date & time: 08/31/2011  4:57 PM   Chief Complaint  Patient presents with  . Flu-like symptoms    HPI Pt was seen at 1740.   Per pt and family, c/o gradual onset and worsening of persistent generalized weakness, lightheadedness, vague chest "pain," as well as multiple intermittent episodes of N/V x2-3 weeks.  Pt states the lightheadedness worsens with standing.  Pt states she has been eval by her PMD for same, given "abx" without change in symptoms.  Denies fevers, no diarrhea, no palpitations, no SOB/cough, no abd pain, no back pain, no black or blood in stools or emesis, no rash.     Past Medical History  Diagnosis Date  . CAD (coronary artery disease)     palpitations, dizziness, chest pain  . Hx of colonic polyps     adenomatous  . Tobacco abuse   . Back pain, chronic   . Cervical cancer   . Allergic rhinitis   . Anxiety and depression   . Nephrolithiasis   . Pancreatitis chronic   . Hematochezia   . GERD (gastroesophageal reflux disease)   . COPD (chronic obstructive pulmonary disease)   . Weight loss   . Renal calculus   . Hyperlipidemia     Lipid profile on 02/25/2011: 209, 113, 55, 132    Past Surgical History  Procedure Date  . Partial hysterectomy   . Appendectomy   . Colonoscopy w/ polypectomy 2006    hemorrhoids, benign gastric nodule, multiple adenomatous polyps, one with tubular morphology    Family History  Problem Relation Age of Onset  . Heart attack Father 24    History  Substance Use Topics  . Smoking status: Current Everyday Smoker -- 1.0 packs/day    Types: Cigarettes  . Smokeless tobacco: Never Used  . Alcohol Use: No    Review of Systems ROS: Statement: All systems negative except as marked or noted in the HPI; Constitutional: Negative for fever and chills. ; ; Eyes: Negative for eye pain, redness and discharge. ; ; ENMT: Negative for ear pain, hoarseness, nasal congestion, sinus pressure and sore  throat. ; ; Cardiovascular: +CP.  Negative for palpitations, diaphoresis, dyspnea and peripheral edema. ; ; Respiratory: Negative for cough, wheezing and stridor. ; ; Gastrointestinal: +N/V.  Negative for diarrhea and abdominal pain, blood in stool, hematemesis, jaundice and rectal bleeding. . ; ; Genitourinary: Negative for dysuria, flank pain and hematuria. ; ; Musculoskeletal: Negative for back pain and neck pain. Negative for swelling and trauma.; ; Skin: Negative for pruritus, rash, abrasions, blisters, bruising and skin lesion.; ; Neuro: +lightheadedness, generalized weakness/fatigue.  Negative for headache and neck stiffness. Negative for altered level of consciousness , altered mental status, extremity weakness, paresthesias, involuntary movement, seizure and syncope.     Allergies  Ibuprofen and Iohexol  Home Medications   Current Outpatient Rx  Name Route Sig Dispense Refill  . ACETAMINOPHEN 500 MG PO TABS Oral Take 500 mg by mouth as needed. For pain    . ALENDRONATE SODIUM 70 MG PO TABS Oral Take 70 mg by mouth every 7 (seven) days. Take with [name] full glass of water on an empty stomach.     . ASPIRIN EFFERVESCENT 325 MG PO TBEF Oral Take 325 mg by mouth as needed. For relief     . CALCIUM 600 PO Oral Take 1 tablet by mouth daily.      Marland Kitchen DOCUSATE SODIUM 100 MG PO CAPS Oral  Take 100 mg by mouth daily.     . DULOXETINE HCL 60 MG PO CPEP Oral Take 60 mg by mouth daily.      . FENTANYL 100 MCG/HR TD PT72 Transdermal Place 1 patch onto the skin every 3 (three) days.      Marland Kitchen FLUTICASONE-SALMETEROL 100-50 MCG/DOSE IN AEPB Inhalation Inhale 1 puff into the lungs every 12 (twelve) hours.      Marland Kitchen LORAZEPAM 0.5 MG PO TABS Oral Take 0.5 mg by mouth every 4 (four) hours as needed. Take every 4 to 6 hours as needed for nerves    . METOPROLOL TARTRATE 50 MG PO TABS Oral Take 25 mg by mouth 2 (two) times daily.      Marland Kitchen NITROGLYCERIN 0.4 MG SL SUBL Sublingual Place 0.4 mg under the tongue every 5 (five)  minutes as needed.      Marland Kitchen OMEPRAZOLE 20 MG PO CPDR  TAKE ONE CAPSULE DAILY. 30 capsule 11  . PANCRELIPASE (LIP-PROT-AMYL) 24000 UNITS PO CPEP Oral Take 1 capsule by mouth 3 (three) times daily. 2 tabs with meals 1 with snacks     . PROMETHAZINE HCL 25 MG PO TABS Oral Take 25-50 mg by mouth every 4 (four) hours as needed. For nausea       BP 87/50  Pulse 82  Temp(Src) 99.1 F (37.3 C) (Oral)  Resp 21  Ht 5\' 4"  (1.626 m)  Wt 82 lb (37.195 kg)  BMI 14.08 kg/m2  SpO2 98%  Physical Exam 1745: Physical examination:  Nursing notes reviewed; Vital signs and O2 SAT reviewed;  Constitutional:  Thin, frail.  In no acute distress; Head:  Normocephalic, atraumatic; Eyes: EOMI, PERRL, No scleral icterus; ENMT: Mouth and pharynx normal, Mucous membranes dry; Neck: Supple, Full range of motion, No lymphadenopathy; Cardiovascular: Regular rate and rhythm, No murmur, rub, or gallop; Respiratory: Breath sounds clear & equal bilaterally, No rales, rhonchi, wheezes, or rub, Normal respiratory effort/excursion; Chest: Nontender, Movement normal; Abdomen: Soft, Nontender, Nondistended, Normal bowel sounds; Extremities: Pulses normal, No tenderness, No edema, No calf edema or asymmetry.; Neuro: AA&Ox3, Major CN grossly intact. No facial droop, speech clear. No gross focal motor or sensory deficits in extremities.; Skin: Color normal, Warm, Dry, no rash, no petechiae.    ED Course  Procedures    MDM  MDM Reviewed: nursing note and vitals Reviewed previous: ECG Interpretation: labs, ECG and x-ray   CRITICAL CARE Performed by: Laray Anger Total critical care time: 35 Critical care time was exclusive of separately billable procedures and treating other patients. Critical care was necessary to treat or prevent imminent or life-threatening deterioration. Critical care was time spent personally by me on the following activities: development of treatment plan with patient and/or surrogate as well as  nursing, discussions with consultants, evaluation of patient's response to treatment, examination of patient, obtaining history from patient or surrogate, ordering and performing treatments and interventions, ordering and review of laboratory studies, ordering and review of radiographic studies, pulse oximetry and re-evaluation of patient's condition.    Date: 08/31/2011  Rate: 86  Rhythm: normal sinus rhythm  QRS Axis: normal  Intervals: normal  ST/T Wave abnormalities: ST depressions inferiorly and ST depressions anteriorly, flipped T-waves anterior-inferior leads  Conduction Disutrbances:none  Narrative Interpretation:   Old EKG Reviewed: changes noted; ST-T wave changes new since previous EKG dated 02/19/2011.  Results for orders placed during the hospital encounter of 08/31/11  CBC      Component Value Range   WBC 5.1  4.0 - 10.5 (K/uL)   RBC 4.21  3.87 - 5.11 (MIL/uL)   Hemoglobin 13.2  12.0 - 15.0 (g/dL)   HCT 16.1  09.6 - 04.5 (%)   MCV 89.3  78.0 - 100.0 (fL)   MCH 31.4  26.0 - 34.0 (pg)   MCHC 35.1  30.0 - 36.0 (g/dL)   RDW 40.9  81.1 - 91.4 (%)   Platelets 177  150 - 400 (K/uL)  DIFFERENTIAL      Component Value Range   Neutrophils Relative 83 (*) 43 - 77 (%)   Neutro Abs 4.3  1.7 - 7.7 (K/uL)   Lymphocytes Relative 8 (*) 12 - 46 (%)   Lymphs Abs 0.4 (*) 0.7 - 4.0 (K/uL)   Monocytes Relative 9  3 - 12 (%)   Monocytes Absolute 0.4  0.1 - 1.0 (K/uL)   Eosinophils Relative 0  0 - 5 (%)   Eosinophils Absolute 0.0  0.0 - 0.7 (K/uL)   Basophils Relative 0  0 - 1 (%)   Basophils Absolute 0.0  0.0 - 0.1 (K/uL)  COMPREHENSIVE METABOLIC PANEL      Component Value Range   Sodium 129 (*) 135 - 145 (mEq/L)   Potassium 3.5  3.5 - 5.1 (mEq/L)   Chloride 92 (*) 96 - 112 (mEq/L)   CO2 28  19 - 32 (mEq/L)   Glucose, Bld 112 (*) 70 - 99 (mg/dL)   BUN 14  6 - 23 (mg/dL)   Creatinine, Ser 7.82  0.50 - 1.10 (mg/dL)   Calcium 8.8  8.4 - 95.6 (mg/dL)   Total Protein 6.7  6.0 - 8.3  (g/dL)   Albumin 3.5  3.5 - 5.2 (g/dL)   AST 23  0 - 37 (U/L)   ALT 10  0 - 35 (U/L)   Alkaline Phosphatase 79  39 - 117 (U/L)   Total Bilirubin 0.4  0.3 - 1.2 (mg/dL)   GFR calc non Af Amer >90  >90 (mL/min)   GFR calc Af Amer >90  >90 (mL/min)  LIPASE, BLOOD      Component Value Range   Lipase 15  11 - 59 (U/L)  LACTIC ACID, PLASMA      Component Value Range   Lactic Acid, Venous 1.0  0.5 - 2.2 (mmol/L)  URINALYSIS, ROUTINE W REFLEX MICROSCOPIC      Component Value Range   Color, Urine YELLOW  YELLOW    APPearance CLEAR  CLEAR    Specific Gravity, Urine >1.030 (*) 1.005 - 1.030    pH 5.5  5.0 - 8.0    Glucose, UA NEGATIVE  NEGATIVE (mg/dL)   Hgb urine dipstick LARGE (*) NEGATIVE    Bilirubin Urine NEGATIVE  NEGATIVE    Ketones, ur NEGATIVE  NEGATIVE (mg/dL)   Protein, ur TRACE (*) NEGATIVE (mg/dL)   Urobilinogen, UA 0.2  0.0 - 1.0 (mg/dL)   Nitrite NEGATIVE  NEGATIVE    Leukocytes, UA NEGATIVE  NEGATIVE   POCT I-STAT TROPONIN I      Component Value Range   Troponin i, poc 0.00  0.00 - 0.08 (ng/mL)   Comment 3           URINE MICROSCOPIC-ADD ON      Component Value Range   Squamous Epithelial / LPF RARE  RARE    WBC, UA 0-2  <3 (WBC/hpf)   RBC / HPF 7-10  <3 (RBC/hpf)   Bacteria, UA RARE  RARE    Results for [name], [name] (  MRN [id number]) as of 08/31/2011 19:50  Ref. Range 02/19/2011 16:15 08/31/2011 17:13  Sodium Latest Range: 135-145 mEq/L 139 129 (L)  Potassium Latest Range: 3.5-5.1 mEq/L 3.2 (L) 3.5  Chloride Latest Range: 96-112 mEq/L 101 92 (L)    Dg Chest 2 View  08/31/2011  *RADIOLOGY REPORT*  Clinical Data: Cough and congestion.  Chest pain.  CHEST - 2 VIEW  Comparison: 03/28/2009.  Findings: There are changes of COPD - stable.  There are no infiltrates or edematous changes.  The heart mediastinal structures are normal.  There is no pneumothorax.  IMPRESSION: Changes of COPD - stable.  No acute findings.  Original Report Authenticated By: Rolla Plate,  M.D.     7:39 PM:  Pt dozing on and off during ED stay, easily arousable to name.  NAD, resps easy.  After IVF bolus, SBP increased to 91, and tachycardia has resolved (HR 80's).  Denies specific CP or abd pain.  EKG with new changes, troponin normal.  ASA given.  No N/V/D while in ED.  No fevers.  No specific infection found on dx testing today.  Dx testing d/w pt and family.  Questions answered.  Verb understanding, agreeable to admit.  T/C to Dr. Janna Arch, case discussed, including:  HPI, pertinent PM/SHx, VS/PE, dx testing, ED course and treatment.  Agreeable to admit.  Requests to speak with ED RN for admit orders.      Imanol Bihl Allison Quarry, DO 09/02/11 0015

## 2011-09-01 ENCOUNTER — Inpatient Hospital Stay (HOSPITAL_COMMUNITY): Payer: Medicare Other

## 2011-09-01 LAB — BASIC METABOLIC PANEL
BUN: 11 mg/dL (ref 6–23)
Calcium: 8.2 mg/dL — ABNORMAL LOW (ref 8.4–10.5)
Chloride: 100 mEq/L (ref 96–112)
Creatinine, Ser: 0.49 mg/dL — ABNORMAL LOW (ref 0.50–1.10)
GFR calc Af Amer: >90 mL/min (ref >90)
GFR calc non Af Amer: >90 mL/min (ref >90)

## 2011-09-01 LAB — URINE CULTURE: Culture  Setup Time: 201212040204

## 2011-09-01 LAB — INFLUENZA PANEL BY PCR (TYPE A & B): Influenza A By PCR: NEGATIVE

## 2011-09-01 MED ORDER — DULOXETINE HCL 60 MG PO CPEP
60.0000 mg | ORAL_CAPSULE | Freq: Every day | ORAL | Status: DC
  Administered 2011-09-01 – 2011-09-03 (×3): 60 mg via ORAL
  Filled 2011-09-01 (×3): qty 1

## 2011-09-01 MED ORDER — ACETAMINOPHEN 500 MG PO TABS
500.0000 mg | ORAL_TABLET | ORAL | Status: DC | PRN
  Administered 2011-09-01 – 2011-09-02 (×6): 500 mg via ORAL
  Filled 2011-09-01 (×6): qty 1

## 2011-09-01 MED ORDER — METOPROLOL TARTRATE 25 MG PO TABS
25.0000 mg | ORAL_TABLET | Freq: Two times a day (BID) | ORAL | Status: DC
  Administered 2011-09-03 (×2): 25 mg via ORAL
  Filled 2011-09-01 (×4): qty 1

## 2011-09-01 MED ORDER — PANCRELIPASE (LIP-PROT-AMYL) 12000-38000 UNITS PO CPEP
2.0000 | ORAL_CAPSULE | Freq: Three times a day (TID) | ORAL | Status: DC
  Administered 2011-09-01 – 2011-09-04 (×10): 2 via ORAL
  Filled 2011-09-01 (×10): qty 2

## 2011-09-01 MED ORDER — DOCUSATE SODIUM 100 MG PO CAPS
100.0000 mg | ORAL_CAPSULE | Freq: Every day | ORAL | Status: DC
  Administered 2011-09-01 – 2011-09-03 (×3): 100 mg via ORAL
  Filled 2011-09-01 (×3): qty 1

## 2011-09-01 MED ORDER — SODIUM CHLORIDE 0.9 % IJ SOLN
INTRAMUSCULAR | Status: AC
  Administered 2011-09-01: 01:00:00
  Filled 2011-09-01: qty 3

## 2011-09-01 MED ORDER — LORAZEPAM 0.5 MG PO TABS
0.5000 mg | ORAL_TABLET | Freq: Four times a day (QID) | ORAL | Status: DC | PRN
  Administered 2011-09-01 – 2011-09-03 (×5): 0.5 mg via ORAL
  Filled 2011-09-01 (×5): qty 1

## 2011-09-01 MED ORDER — LEVOFLOXACIN 500 MG PO TABS
500.0000 mg | ORAL_TABLET | Freq: Every day | ORAL | Status: DC
  Administered 2011-09-01 – 2011-09-02 (×2): 500 mg via ORAL
  Filled 2011-09-01 (×3): qty 1

## 2011-09-01 MED ORDER — ENOXAPARIN SODIUM 30 MG/0.3ML ~~LOC~~ SOLN
30.0000 mg | Freq: Every day | SUBCUTANEOUS | Status: DC
  Administered 2011-09-01 – 2011-09-03 (×3): 30 mg via SUBCUTANEOUS
  Filled 2011-09-01 (×3): qty 0.3

## 2011-09-01 MED ORDER — FENTANYL 100 MCG/HR TD PT72
100.0000 ug | MEDICATED_PATCH | TRANSDERMAL | Status: DC
  Administered 2011-09-03: 100 ug via TRANSDERMAL
  Filled 2011-09-01: qty 1

## 2011-09-01 MED ORDER — NITROGLYCERIN 0.4 MG SL SUBL: 0.4000 mg | SUBLINGUAL_TABLET | SUBLINGUAL | Status: DC | PRN

## 2011-09-01 MED ORDER — PRO-STAT SUGAR FREE PO LIQD
30.0000 mL | Freq: Three times a day (TID) | ORAL | Status: DC
  Filled 2011-09-01 (×2): qty 30

## 2011-09-01 MED ORDER — PANTOPRAZOLE SODIUM 40 MG PO TBEC
40.0000 mg | DELAYED_RELEASE_TABLET | Freq: Every day | ORAL | Status: DC
  Administered 2011-09-01 – 2011-09-03 (×3): 40 mg via ORAL
  Filled 2011-09-01 (×3): qty 1

## 2011-09-01 MED ORDER — SODIUM CHLORIDE 0.9 % IV SOLN
INTRAVENOUS | Status: DC
  Administered 2011-09-01 – 2011-09-04 (×11): via INTRAVENOUS

## 2011-09-01 MED ORDER — PROMETHAZINE HCL 12.5 MG PO TABS
25.0000 mg | ORAL_TABLET | Freq: Four times a day (QID) | ORAL | Status: DC | PRN
  Administered 2011-09-03: 25 mg via ORAL
  Filled 2011-09-01: qty 2

## 2011-09-01 MED ORDER — CALCIUM CARBONATE 1250 (500 CA) MG PO TABS
1.0000 | ORAL_TABLET | Freq: Every day | ORAL | Status: DC
  Administered 2011-09-01 – 2011-09-03 (×3): 500 mg via ORAL
  Filled 2011-09-01 (×3): qty 1

## 2011-09-01 MED ORDER — BOOST / RESOURCE BREEZE PO LIQD
1.0000 | Freq: Two times a day (BID) | ORAL | Status: DC
  Administered 2011-09-01 – 2011-09-02 (×3): 1 via ORAL
  Filled 2011-09-01 (×7): qty 1

## 2011-09-01 MED ORDER — FLUTICASONE-SALMETEROL 100-50 MCG/DOSE IN AEPB
1.0000 | INHALATION_SPRAY | Freq: Two times a day (BID) | RESPIRATORY_TRACT | Status: DC
  Administered 2011-09-01 – 2011-09-04 (×7): 1 via RESPIRATORY_TRACT
  Filled 2011-09-01: qty 14

## 2011-09-01 NOTE — Progress Notes (Signed)
INITIAL ADULT NUTRITION ASSESSMENT Date: 09/01/2011   Time: 11:39 AM  Reason for Assessment: Pt appears malnourished  ASSESSMENT: Female 65 y.o.  [id number], N/V, Dehydration, Hyponatremia  Past Medical History  Diagnosis Date  . CAD (coronary artery disease)     palpitations, dizziness, chest pain  . Hx of colonic polyps     adenomatous  . Tobacco abuse   . Back pain, chronic   . Cervical cancer   . Allergic rhinitis   . Anxiety and depression   . Nephrolithiasis   . Pancreatitis chronic   . Hematochezia   . GERD (gastroesophageal reflux disease)   . COPD (chronic obstructive pulmonary disease)   . Weight loss   . Renal calculus   . Hyperlipidemia     Lipid profile on 02/25/2011: 209, 113, 55, 132    Scheduled Meds:   . aspirin  324 mg Oral Once  . calcium carbonate  1 tablet Oral Daily  . docusate sodium  100 mg Oral Daily  . DULoxetine  60 mg Oral Daily  . enoxaparin (LOVENOX) injection  30 mg Subcutaneous Daily  . fentaNYL  100 mcg Transdermal Q72H  . Fluticasone-Salmeterol  1 puff Inhalation BID  . levofloxacin  500 mg Oral Daily  . lipase/protease/amylase  2 capsule Oral TID AC  . metoprolol tartrate  25 mg Oral BID  . pantoprazole  40 mg Oral Q1200  . sodium chloride  500 mL Intravenous Once  . sodium chloride  500 mL Intravenous Once  . sodium chloride      . DISCONTD: sodium chloride   Intravenous Once   Continuous Infusions:   . sodium chloride 150 mL/hr at 09/01/11 1044  . DISCONTD: sodium chloride 100 mL/hr at 08/31/11 1852   PRN Meds:.acetaminophen, LORazepam, nitroGLYCERIN, promethazine  Ht: 5\' 4"  (162.6 cm)  Wt: 82 lb 10.8 oz (37.5 kg)  Ideal Wt: 54.7 kg  % Ideal Wt: 69%  Usual Wt: 90-95# % Usual Wt:89%  Body mass index is 14.19 kg/(m^2).  Food/Nutrition Related Hx: Pt reports severe wt loss of ~10# during past month. Poor tol of oral intake (N/V). C/o headache and allergies today; also says that her mouth hurts. She is  edentulous. Able to feed herself. She meets criteria for severe malnutrition acute and chronic due to her severe wt loss and inadequate oral intake.    CMP     Component Value Date/Time   NA 132* 09/01/2011 0515   K 3.4* 09/01/2011 0515   CL 100 09/01/2011 0515   CO2 25 09/01/2011 0515   GLUCOSE 111* 09/01/2011 0515   BUN 11 09/01/2011 0515   CREATININE 0.49* 09/01/2011 0515   CREATININE 0.43 01/07/2011 1206   CALCIUM 8.2* 09/01/2011 0515   PROT 6.7 08/31/2011 1713   ALBUMIN 3.5 08/31/2011 1713   AST 23 08/31/2011 1713   ALT 10 08/31/2011 1713   ALKPHOS 79 08/31/2011 1713   BILITOT 0.4 08/31/2011 1713   GFRNONAA >90 09/01/2011 0515   GFRAA >90 09/01/2011 0515    CBC    Component Value Date/Time   WBC 5.1 08/31/2011 1713   RBC 4.21 08/31/2011 1713   HGB 13.2 08/31/2011 1713   HCT 37.6 08/31/2011 1713   PLT 177 08/31/2011 1713   MCV 89.3 08/31/2011 1713   MCH 31.4 08/31/2011 1713   MCHC 35.1 08/31/2011 1713   RDW 12.8 08/31/2011 1713   LYMPHSABS 0.4* 08/31/2011 1713   MONOABS 0.4 08/31/2011 1713   EOSABS 0.0 08/31/2011 1713   BASOSABS  0.0 08/31/2011 1713   No intake or output data in the 24 hours ending 09/01/11 1149   Diet Order: Regular  Supplements/Tube Feeding:N/A  IVF:    sodium chloride Last Rate: 150 mL/hr at 09/01/11 1044  DISCONTD: sodium chloride Last Rate: 100 mL/hr at 08/31/11 1852    Estimated Nutritional Needs:   Kcal:1295-1480 Protein:67-74 grams Fluid:1.2 L/d  NUTRITION DIAGNOSIS: -Inadequate oral intake (NI-2.1).  Status: Ongoing  RELATED TO: decr appetite, Altered GI function  AS EVIDENCE BY: Poor po intake, N/V dehydration and unintentional wt loss  GOAL: Pt will tol oral intake sufficient to meet >90% of est nutritional needs.  MONITORING: Po intake, wt changes and labs.   EDUCATION NEEDS: -Education needs addressed r/t importance of adequate nutr intake   INTERVENTION: -Add Resource Breeze BID between meals -Add ProStat 30 ml TID  Dietitian  [phone]  DOCUMENTATION CODES Per approved criteria  -Severe malnutrition in the context of acute illness or injury -Severe malnutrition in the context of chronic illness -Underweight    Curtiss Mahmood, Charlann Noss 09/01/2011, 11:39 AM

## 2011-09-01 NOTE — H&P (Signed)
NAMEJENNESSY, SANDRIDGE             ACCOUNT NO.:  [id number]  MEDICAL RECORD NO.:  [id number]  LOCATION:  A318                          FACILITY:  [facility]  PHYSICIAN:  Kayliegh Boyers G. Renard Matter, MD   DATE OF BIRTH:  [birth year]  DATE OF ADMISSION:  08/31/2011 DATE OF DISCHARGE:  LH                             HISTORY & PHYSICAL   The patient came to the emergency department with flu-like symptoms, weakness, lightheadedness and vague chest pain.  LABORATORY DATA:  CBC:  WBC 5100 with hemoglobin 13.2, hematocrit 37.6. UA essentially negative.  A chest x-ray showed changes of COPD, stable. No infiltrates.  It was felt that she had a flu-like illness and was subsequently admitted.  SOCIAL HISTORY:  The patient smokes approximately 1 pack of cigarettes daily.  Does not use alcohol.  PAST MEDICAL HISTORY: 1. Coronary artery disease. 2. History of cervical cancer. 3. Chronic low back pain. 4. Nephrolithiasis. 5. History of pancreatitis. 6. Hematochezia. 7. Gastroesophageal reflux disease. 8. Chronic obstructive pulmonary disease. 9. History of colon polyps. 10.Weight loss. 11.Renal calculus. 12.Hyperlipidemia.  PAST SURGICAL HISTORY:  Partial hysterectomy, appendectomy, colonoscopy with polypectomy.  FAMILY HISTORY:  The father had heart attack.  REVIEW OF SYSTEMS:  HEENT:  Negative.  LUNGS:  Occasional cough.  HEART: No chest pain, diaphoresis but some dyspnea with exertion.  GI:  No bowel irregularity or bleeding.  GU:  No dysuria or hematuria.  ALLERGIES:  IBUPROFEN and IOHEXOL.  MEDICATIONS: 1. Acetaminophen 500 mg every 4 hours p.r.n. for pain. 2. Alendronate 70 mg daily. 3. Aspirin 325 mg daily. 4. Docusate sodium 100 mg daily. 5. Duloxetine hydrochloride 60 mg daily. 6. Fentanyl patch 100 mcg every 3 days. 7. Fluticasone/salmeterol 100/50 one puff every 12 hours. 8. Lorazepam 0.5 mg every 4 hours as needed. 9. Metoprolol 25 mg twice a day 10.P.r.n.  nitroglycerin. 11.Omeprazole 20 mg daily. 12.Pancrease 2400 units 1 capsule 3 times a day. 13.Promethazine hydrochloride 25 mg every 4 hours p.r.n.  PHYSICAL EXAMINATION:  VITAL SIGNS:  Alert female with blood pressure 87/50, pulse 82, temp 99. HEENT:  Eyes: PERRLA.  TM negative.  Oropharynx benign. NECK:  Supple.  No JVD or thyroid abnormalities. BREASTS:  Normal.  No masses. HEART:  Regular rhythm.  No murmurs. LUNGS:  Clear to P and A.  No rales, rhonchi, or wheezes. ABDOMEN:  No palpable organs or masses. EXTREMITIES:  Free of edema. NEUROLOGIC:  No focal deficits.  No sensory or motor abnormalities. SKIN:  Warm and dry.  PLAN:  To continue to monitor the patient.  Continue current regimen.     Joycelynn Fritsche G. Renard Matter, MD     AGM/MEDQ  D:  09/01/2011  T:  09/01/2011  Job:  [id number]

## 2011-09-01 NOTE — Progress Notes (Signed)
CARE MANAGEMENT NOTE 09/01/2011  Patient:  [name], [name]   Account Number:  [id number]  Date Initiated:  09/01/2011  Documentation initiated by:  Rosemary Holms  Subjective/Objective Assessment:   Pt admitted from home with flu-like symptoms.     Action/Plan:   Anticipate home without HH needs   Anticipated DC Date:  09/03/2011   Anticipated DC Plan:  HOME/SELF CARE      DC Planning Services  CM consult      Choice offered to / List presented to:             Status of service:  In process, will continue to follow Medicare Important Message given?  YES (If response is "NO", the following Medicare IM given date fields will be blank) Date Medicare IM given:  09/01/2011 Date Additional Medicare IM given:    Discharge Disposition:    Per UR Regulation:    Comments:  09/01/11 11:45 Deshanna Kama Leanord Hawking RN BSN CM spoke with pt at bedside. No HH needs identified. CM to follow

## 2011-09-01 NOTE — Progress Notes (Signed)
Paged Dr. Renard Matter regarding pt's Metoprolol ordered at 1000. Pt's HR 78 BP 90/52. Dr. Renard Matter ordered to hold AM dose of Metoprolol. Dagoberto Ligas, Rn

## 2011-09-02 ENCOUNTER — Inpatient Hospital Stay (HOSPITAL_COMMUNITY): Payer: Medicare Other

## 2011-09-02 LAB — BASIC METABOLIC PANEL
BUN: 4 mg/dL — ABNORMAL LOW (ref 6–23)
CO2: 25 mEq/L (ref 19–32)
Calcium: 8 mg/dL — ABNORMAL LOW (ref 8.4–10.5)
Creatinine, Ser: 0.37 mg/dL — ABNORMAL LOW (ref 0.50–1.10)
GFR calc non Af Amer: >90 mL/min (ref >90)
Glucose, Bld: 90 mg/dL (ref 70–99)

## 2011-09-02 MED ORDER — POTASSIUM CHLORIDE 10 MEQ/100ML IV SOLN
10.0000 meq | INTRAVENOUS | Status: AC
  Administered 2011-09-02 (×3): 10 meq via INTRAVENOUS
  Filled 2011-09-02 (×3): qty 100

## 2011-09-02 NOTE — Progress Notes (Signed)
NAMEBRENLEY, PRIORE             ACCOUNT NO.:  [id number]  MEDICAL RECORD NO.:  [id number]  LOCATION:  A318                          FACILITY:  [facility]  PHYSICIAN:  Melizza Kanode G. Renard Matter, MD   DATE OF BIRTH:  04/25/1946  DATE OF PROCEDURE: DATE OF DISCHARGE:                                PROGRESS NOTE   This was admitted with high fever.  She has had episodes of vomiting and weakness, now still complains of headache, facial pain, gum pain, occasional left chest pain.  Her chemistries today did show evidence of hypokalemia with a potassium of 3.0.  Prior chest x-ray showed changes of COPD, no acute findings.  X-ray of sinuses show no sinusitis.  LUNGS:  Diminished breath sounds. HEART:  Regular rhythm. ABDOMEN:  No palpable organs or masses.  PLAN:  To replete serum potassium.  We will obtain a head CT.     Shigeo Baugh G. Renard Matter, MD     AGM/MEDQ  D:  09/02/2011  T:  09/02/2011  Job:  [id number]

## 2011-09-03 LAB — BASIC METABOLIC PANEL
BUN: <3 mg/dL — ABNORMAL LOW (ref 6–23)
BUN: <3 mg/dL — ABNORMAL LOW (ref 6–23)
Calcium: 8.3 mg/dL — ABNORMAL LOW (ref 8.4–10.5)
Chloride: 101 mEq/L (ref 96–112)
Creatinine, Ser: 0.41 mg/dL — ABNORMAL LOW (ref 0.50–1.10)
Creatinine, Ser: 0.45 mg/dL — ABNORMAL LOW (ref 0.50–1.10)
GFR calc Af Amer: >90 mL/min (ref >90)
GFR calc Af Amer: >90 mL/min (ref >90)
GFR calc non Af Amer: >90 mL/min (ref >90)
Glucose, Bld: 94 mg/dL (ref 70–99)

## 2011-09-03 MED ORDER — POTASSIUM CHLORIDE 20 MEQ PO PACK
20.0000 meq | PACK | Freq: Every day | ORAL | Status: DC
  Administered 2011-09-03: 20 meq via ORAL
  Filled 2011-09-03 (×2): qty 1

## 2011-09-03 MED ORDER — POTASSIUM CHLORIDE 10 MEQ/100ML IV SOLN
10.0000 meq | INTRAVENOUS | Status: AC
  Administered 2011-09-03 (×2): 10 meq via INTRAVENOUS
  Filled 2011-09-03: qty 300

## 2011-09-03 MED ORDER — LEVOFLOXACIN 250 MG PO TABS
250.0000 mg | ORAL_TABLET | Freq: Every day | ORAL | Status: DC
  Administered 2011-09-03: 250 mg via ORAL

## 2011-09-03 NOTE — Progress Notes (Signed)
[name], [name]             ACCOUNT NO.:  [id number]  MEDICAL RECORD NO.:  [id number]  LOCATION:  A318                          FACILITY:  [facility]  PHYSICIAN:  Ishitha Roper G. Renard Matter, MD   DATE OF BIRTH:  27-May-1946  DATE OF PROCEDURE: DATE OF DISCHARGE:                                PROGRESS NOTE   This patient was admitted with high fever, episodes of vomiting and weaknesses, facial pain and left chest pain.  She did have hypokalemia yesterday and received runs of potassium.  Her potassium was 3.0, repeat potassium today is still 3.0.  She did have CT of the head, which showed no acute intracranial abnormalities but she does have a small amount of fluid within the right maxillary sinus.  There is mild cortical atrophy.  OBJECTIVE:  VITAL SIGNS:  Blood pressure 112/64, respirations 18, pulse 61, temp 98.6. LUNGS:  Diminished breath sounds. HEART:  Regular rhythm. ABDOMEN: No masses.  ASSESSMENT:  The patient was admitted with episodes of vomiting, chest pain, facial pain, and headache.  She continues to have episodes of hypokalemia.  PLAN:  To again replete serum potassium.  Continue current regimen, Levaquin 500 mg daily, Protonix 40 mg daily, KCl 20 mEq daily.     Terrell Shimko G. Renard Matter, MD     AGM/MEDQ  D:  09/03/2011  T:  09/03/2011  Job:  [id number]

## 2011-09-04 LAB — BASIC METABOLIC PANEL WITH GFR
BUN: <3 mg/dL — ABNORMAL LOW (ref 6–23)
CO2: 29 meq/L (ref 19–32)
Calcium: 8.1 mg/dL — ABNORMAL LOW (ref 8.4–10.5)
Chloride: 105 meq/L (ref 96–112)
Creatinine, Ser: 0.52 mg/dL (ref 0.50–1.10)
GFR calc Af Amer: >90 mL/min
GFR calc non Af Amer: >90 mL/min
Glucose, Bld: 82 mg/dL (ref 70–99)
Potassium: 3 meq/L — ABNORMAL LOW (ref 3.5–5.1)
Sodium: 139 meq/L (ref 135–145)

## 2011-09-04 MED ORDER — POTASSIUM CHLORIDE 20 MEQ PO PACK: 20.0000 meq | PACK | Freq: Every day | ORAL | Status: DC

## 2011-09-04 MED ORDER — POTASSIUM CHLORIDE CRYS ER 20 MEQ PO TBCR
20.0000 meq | EXTENDED_RELEASE_TABLET | Freq: Every day | ORAL | Status: DC
  Administered 2011-09-04: 20 meq via ORAL
  Filled 2011-09-04: qty 1

## 2011-09-04 MED ORDER — POTASSIUM CHLORIDE CRYS ER 20 MEQ PO TBCR: 20.0000 meq | EXTENDED_RELEASE_TABLET | Freq: Every day | ORAL | Status: DC

## 2011-09-04 NOTE — Progress Notes (Signed)
Pt discharged home via family; Pt and family given and explained all discharge instructions, carenotes, and prescriptions; pt and family stated understanding and denied questions/concerns; all f/u appointments in place; IV removed without complicaitons; pt stable at time of discharge  

## 2011-09-04 NOTE — Progress Notes (Signed)
CARE MANAGEMENT NOTE 09/04/2011  Patient:  [name], [name]   Account Number:  [id number]  Date Initiated:  09/01/2011  Documentation initiated by:  Rosemary Holms  Subjective/Objective Assessment:   Pt admitted from home with flu-like symptoms.     Action/Plan:   Anticipate home without HH needs   Anticipated DC Date:  09/03/2011   Anticipated DC Plan:  HOME/SELF CARE      DC Planning Services  CM consult      Choice offered to / List presented to:             Status of service:  Completed, signed off Medicare Important Message given?  YES (If response is "NO", the following Medicare IM given date fields will be blank) Date Medicare IM given:  09/01/2011 Date Additional Medicare IM given:    Discharge Disposition:  HOME/SELF CARE  Per UR Regulation:    Comments:  09/01/11 11:45 Shalicia Craghead Leanord Hawking RN BSN CM spoke with pt at bedside. No HH needs identified. CM to follow

## 2011-09-04 NOTE — Discharge Summary (Signed)
[name], [name]             ACCOUNT NO.:  [id number]  MEDICAL RECORD NO.:  [id number]  LOCATION:  A318                          FACILITY:  [facility]  PHYSICIAN:  Veronnica Hennings G. Renard Matter, MD   DATE OF BIRTH:  1945-09-29  DATE OF ADMISSION:  08/31/2011 DATE OF DISCHARGE:  LH                              DISCHARGE SUMMARY   DIAGNOSES: 1. Dehydration. 2. Hypokalemia. 3. Hyponatremia. 4. Chronic obstructive pulmonary disease. 5. Fluid in right maxillary sinus. 6. Coronary artery disease. 7. 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 8. History of pancreatitis. 9. Gastroesophageal reflux disease.  CONDITION:  Stable and improved at the time of her discharge.  This patient came to the emergency room with flu-like symptoms, weakness, lightheadedness and vague chest pains.  A chest x-ray showed evidence of COPD but no infiltrates.  The patient has a prior history of coronary artery disease, chronic low back pain, nephrolithiasis pancreatitis, gastroesophageal reflux disease.  PHYSICAL EXAMINATION:  VITAL SIGNS:  On admission, alert female with blood pressure 87/50, pulse 82, temp 99. HEENT:  Eyes, PERRLA.  TMs negative.  Oropharynx benign. NECK:  Supple.  No JVD or thyroid abnormalities. HEART:  Regular rhythm.  No murmurs. LUNGS:  Clear to P and A.  Diminished breath sounds. ABDOMEN:  No palpable organs or masses. EXTREMITIES:  Free of edema. NEUROLOGICAL:  No focal deficit.  No sensory motor abnormalities.  LABORATORY DATA:  Admission CBC, WBC 5100, hemoglobin 13.2, hematocrit 37.6.  Chemistries on admissions showed a sodium of 129, potassium 3.5, chloride 92, CO2 28, BUN 14, creatinine 0.58, calcium 8.8, glucose 112, alkaline phosphatase 79, albumin 3.5, lipase 15,  SGOT 23, SGPT 10, and total protein 6.7.  Subsequent chemistries December 4, sodium 135, potassium 3.0, chloride 102, CO2 25, BUN 4, creatinine 0.37.  Subsequent chemistries  on December 6, sodium 135, potassium 3.4, chloride 101, CO2 28.  Urinalysis negative with the exception of 7-10 rbc's.  CT of the head without contrast, no acute intracranial abnormalities, questionable mild fluid in the right maxillary sinus.  X-ray of nasal sinuses, clear without significant opacifications.  Chest x-ray, changes of COPD, stable.  No acute findings.  HOSPITAL COURSE:  The patient was placed on normal saline on admission. She was started on following medications: 1. Protonix 40 mg daily. 2. Lopressor 25 mg b.i.d. 3. Duragesic patch 100 mcg every 72 hours. 4. Cymbalta 60 mg daily. 5. Colace 100 mg daily. 6. Os-Cal 500 mg daily. 7. She is also placed on DVT prophylaxis with Lovenox 30 mg     subcutaneously daily and Advair Diskus 1 puff b.i.d.  The patient continued to complaint of facial pain on right side of her face.  She did have x-ray of sinuses, which did not show evidence of sinus disease.  She did have a CT of the head, which did show evidence of small amount of fluid in the maxillary sinus.  The patient was felt to be somewhat dehydrated on admission, did have a low serum sodium and low potassium.  Subsequently, this was repleted with runs of KCl 10 mEq each x3.  This patient had episodes of nausea  during her hospital stay. This did improve.  Her chemistries returned normal.  She was discharged on following medications: 1. Tylenol 500 mg every 4 hours p.r.n. 2. Lorazepam 0.5 mg every 6 hours p.r.n. 3. Phenergan 25 mg every 6 hours p.r.n. 4. Os-Cal 250 mg daily. 5. Colace 100 mg daily. 6. Cymbalta 60 mg daily. 7. Fentanyl patch 100 mcg every 72 hours. 8. Advair Diskus 1 puff twice a day. 9. Levaquin 250 mg daily. 10.Creon 10. 11.Pancrease 2 capsules 3 times a day. 12.Metoprolol 25 mg b.i.d. 13.Protonix 40 mg daily. 14.Klor-Con 20 mEq daily.  The patient was stable and improved at the time of her discharge.     Nesta Scaturro G. Renard Matter,  MD     AGM/MEDQ  D:  09/04/2011  T:  09/04/2011  Job:  [id number]

## 2011-10-03 ENCOUNTER — Encounter (HOSPITAL_COMMUNITY): Payer: Self-pay

## 2011-10-03 ENCOUNTER — Emergency Department (HOSPITAL_COMMUNITY)
Admission: EM | Admit: 2011-10-03 | Discharge: 2011-10-03 | Disposition: A | Discharge status: Home / Self Care | Payer: Medicare Other | Attending: Emergency Medicine | Admitting: Emergency Medicine

## 2011-10-03 DIAGNOSIS — F17200 Nicotine dependence, unspecified, uncomplicated: Secondary | ICD-10-CM | POA: Insufficient documentation

## 2011-10-03 DIAGNOSIS — Z79899 Other long term (current) drug therapy: Secondary | ICD-10-CM | POA: Insufficient documentation

## 2011-10-03 DIAGNOSIS — J4489 Other specified chronic obstructive pulmonary disease: Secondary | ICD-10-CM | POA: Insufficient documentation

## 2011-10-03 DIAGNOSIS — R059 Cough, unspecified: Secondary | ICD-10-CM | POA: Insufficient documentation

## 2011-10-03 DIAGNOSIS — F341 Dysthymic disorder: Secondary | ICD-10-CM | POA: Insufficient documentation

## 2011-10-03 DIAGNOSIS — J019 Acute sinusitis, unspecified: Secondary | ICD-10-CM | POA: Insufficient documentation

## 2011-10-03 DIAGNOSIS — E785 Hyperlipidemia, unspecified: Secondary | ICD-10-CM | POA: Insufficient documentation

## 2011-10-03 DIAGNOSIS — R51 Headache: Secondary | ICD-10-CM | POA: Insufficient documentation

## 2011-10-03 DIAGNOSIS — Z8541 Personal history of malignant neoplasm of cervix uteri: Secondary | ICD-10-CM | POA: Insufficient documentation

## 2011-10-03 DIAGNOSIS — R05 Cough: Secondary | ICD-10-CM | POA: Insufficient documentation

## 2011-10-03 DIAGNOSIS — Z7982 Long term (current) use of aspirin: Secondary | ICD-10-CM | POA: Insufficient documentation

## 2011-10-03 DIAGNOSIS — J449 Chronic obstructive pulmonary disease, unspecified: Secondary | ICD-10-CM | POA: Insufficient documentation

## 2011-10-03 DIAGNOSIS — K219 Gastro-esophageal reflux disease without esophagitis: Secondary | ICD-10-CM | POA: Insufficient documentation

## 2011-10-03 DIAGNOSIS — J3489 Other specified disorders of nose and nasal sinuses: Secondary | ICD-10-CM | POA: Insufficient documentation

## 2011-10-03 DIAGNOSIS — I2510 Atherosclerotic heart disease of native coronary artery without angina pectoris: Secondary | ICD-10-CM | POA: Insufficient documentation

## 2011-10-03 MED ORDER — AMOXICILLIN-POT CLAVULANATE 875-125 MG PO TABS
1.0000 | ORAL_TABLET | Freq: Once | ORAL | Status: AC
  Administered 2011-10-03: 1 via ORAL
  Filled 2011-10-03: qty 1

## 2011-10-03 MED ORDER — AMOXICILLIN-POT CLAVULANATE 875-125 MG PO TABS: 1.0000 | ORAL_TABLET | Freq: Two times a day (BID) | ORAL | Status: AC

## 2011-10-03 NOTE — ED Notes (Signed)
Pt presents with sinus congestion, cough, and facial pain. Per pt she was in the hospital approx 1 month ago with sinus blockage. Pt states she is having increased pain now.

## 2011-10-03 NOTE — ED Provider Notes (Signed)
History     CSN: [id number]  Arrival date & time 10/03/11  1657   First MD Initiated Contact with Patient 10/03/11 1914      Chief Complaint  Patient presents with  . Nasal Congestion  . Cough  . Facial Pain    (Consider location/radiation/quality/duration/timing/severity/associated sxs/prior treatment) Patient is a 66 y.o. female presenting with cough. The history is provided by the patient. No language interpreter was used.  Cough This is a new problem. The current episode started 2 days ago. The problem occurs constantly. The problem has not changed since onset.The cough is non-productive. There has been no fever. Pertinent negatives include no shortness of breath and no wheezing. Associated symptoms comments: Sinus pain/pressure.. She has tried nothing for the symptoms. She is a smoker.    Past Medical History  Diagnosis Date  . CAD (coronary artery disease)     palpitations, dizziness, chest pain  . Hx of colonic polyps     adenomatous  . Tobacco abuse   . Back pain, chronic   . Cervical cancer   . Allergic rhinitis   . Anxiety and depression   . Nephrolithiasis   . Pancreatitis chronic   . Hematochezia   . GERD (gastroesophageal reflux disease)   . COPD (chronic obstructive pulmonary disease)   . Weight loss   . Renal calculus   . Hyperlipidemia     Lipid profile on 02/25/2011: 209, 113, 55, 132    Past Surgical History  Procedure Date  . Partial hysterectomy   . Appendectomy   . Colonoscopy w/ polypectomy 2006    hemorrhoids, benign gastric nodule, multiple adenomatous polyps, one with tubular morphology    Family History  Problem Relation Age of Onset  . Heart attack Father 52    History  Substance Use Topics  . Smoking status: Current Everyday Smoker -- 1.0 packs/day    Types: Cigarettes  . Smokeless tobacco: Never Used  . Alcohol Use: No    OB History    Grav Para Term Preterm Abortions TAB SAB Ect Mult Living                  Review of  Systems  HENT: Positive for sinus pressure.   Respiratory: Positive for cough. Negative for shortness of breath, wheezing and stridor.   All other systems reviewed and are negative.    Allergies  Ibuprofen and Iohexol  Home Medications   Current Outpatient Rx  Name Route Sig Dispense Refill  . ACETAMINOPHEN 500 MG PO TABS Oral Take 500 mg by mouth as needed. For pain    . ALENDRONATE SODIUM 70 MG PO TABS Oral Take 70 mg by mouth every 7 (seven) days. Take with a full glass of water on an empty stomach.     . ASPIRIN EFFERVESCENT 325 MG PO TBEF Oral Take 325 mg by mouth as needed. For relief     . CALCIUM 600 PO Oral Take 1 tablet by mouth daily.      Marland Kitchen DOCUSATE SODIUM 100 MG PO CAPS Oral Take 100 mg by mouth daily.     . DULOXETINE HCL 60 MG PO CPEP Oral Take 60 mg by mouth daily.      . FENTANYL 100 MCG/HR TD PT72 Transdermal Place 1 patch onto the skin every 3 (three) days.      Marland Kitchen FLUTICASONE-SALMETEROL 100-50 MCG/DOSE IN AEPB Inhalation Inhale 1 puff into the lungs every 12 (twelve) hours.      Marland Kitchen LORAZEPAM 0.5  MG PO TABS Oral Take 0.5 mg by mouth every 4 (four) hours as needed. Take every 4 to 6 hours as needed for nerves    . METOPROLOL TARTRATE 50 MG PO TABS Oral Take 25 mg by mouth 2 (two) times daily.      Marland Kitchen NITROGLYCERIN 0.4 MG SL SUBL Sublingual Place 0.4 mg under the tongue every 5 (five) minutes as needed.      Marland Kitchen OMEPRAZOLE 20 MG PO CPDR  TAKE ONE CAPSULE DAILY. 30 capsule 11  . PANCRELIPASE (LIP-PROT-AMYL) 24000 UNITS PO CPEP Oral Take 1 capsule by mouth 3 (three) times daily. 2 tabs with meals 1 with snacks     . POTASSIUM CHLORIDE 20 MEQ PO PACK Oral Take 20 mEq by mouth daily. 30 tablet 30  . POTASSIUM CHLORIDE CRYS ER 20 MEQ PO TBCR Oral Take 1 tablet (20 mEq total) by mouth daily.      BP 143/94  Pulse 101  Temp(Src) 98.5 F (36.9 C) (Oral)  Resp 20  Ht 5\' 4"  (1.626 m)  Wt 82 lb (37.195 kg)  BMI 14.08 kg/m2  SpO2 97%  Physical Exam  Nursing note and vitals  reviewed. Constitutional: She is oriented to person, place, and time. She appears well-developed and well-nourished. No distress.  HENT:  Head: Normocephalic and atraumatic.    Eyes: EOM are normal.  Neck: Normal range of motion.  Cardiovascular: Normal rate, regular rhythm and normal heart sounds.   Pulmonary/Chest: Effort normal and breath sounds normal.  Abdominal: Soft. She exhibits no distension. There is no tenderness.  Musculoskeletal: Normal range of motion.  Neurological: She is alert and oriented to person, place, and time.  Skin: Skin is warm and dry.  Psychiatric: She has a normal mood and affect. Judgment normal.    ED Course  Procedures (including critical care time)  Labs Reviewed - No data to display No results found.   No diagnosis found.    MDM          Worthy Rancher, PA 10/03/11 1919

## 2011-10-04 NOTE — ED Provider Notes (Signed)
Medical screening examination/treatment/procedure(s) were performed by non-physician practitioner and as supervising physician I was immediately available for consultation/collaboration.  Kineta Fudala S. Miyoshi Ligas, MD 10/04/11 1308 

## 2011-11-12 ENCOUNTER — Ambulatory Visit (INDEPENDENT_AMBULATORY_CARE_PROVIDER_SITE_OTHER): Payer: Medicare Other | Admitting: Otolaryngology

## 2011-12-10 ENCOUNTER — Other Ambulatory Visit (HOSPITAL_COMMUNITY): Payer: Self-pay | Admitting: Family Medicine

## 2011-12-10 DIAGNOSIS — R52 Pain, unspecified: Secondary | ICD-10-CM

## 2011-12-10 DIAGNOSIS — R209 Unspecified disturbances of skin sensation: Secondary | ICD-10-CM

## 2011-12-15 ENCOUNTER — Ambulatory Visit (HOSPITAL_COMMUNITY)
Admission: RE | Admit: 2011-12-15 | Discharge: 2011-12-15 | Disposition: A | Discharge status: Home / Self Care | Payer: PRIVATE HEALTH INSURANCE | Source: Ambulatory Visit | Attending: Family Medicine | Admitting: Family Medicine

## 2011-12-15 DIAGNOSIS — R209 Unspecified disturbances of skin sensation: Secondary | ICD-10-CM

## 2011-12-15 DIAGNOSIS — M79609 Pain in unspecified limb: Secondary | ICD-10-CM | POA: Insufficient documentation

## 2011-12-15 DIAGNOSIS — R238 Other skin changes: Secondary | ICD-10-CM | POA: Insufficient documentation

## 2011-12-15 DIAGNOSIS — R52 Pain, unspecified: Secondary | ICD-10-CM

## 2011-12-31 ENCOUNTER — Ambulatory Visit (INDEPENDENT_AMBULATORY_CARE_PROVIDER_SITE_OTHER): Payer: PRIVATE HEALTH INSURANCE | Admitting: Otolaryngology

## 2012-01-07 ENCOUNTER — Ambulatory Visit (INDEPENDENT_AMBULATORY_CARE_PROVIDER_SITE_OTHER): Payer: PRIVATE HEALTH INSURANCE | Admitting: Otolaryngology

## 2012-01-07 ENCOUNTER — Encounter (HOSPITAL_COMMUNITY): Payer: Self-pay | Admitting: Emergency Medicine

## 2012-01-07 ENCOUNTER — Emergency Department (HOSPITAL_COMMUNITY)
Admission: EM | Admit: 2012-01-07 | Discharge: 2012-01-07 | Disposition: A | Discharge status: Home / Self Care | Payer: PRIVATE HEALTH INSURANCE | Attending: Emergency Medicine | Admitting: Emergency Medicine

## 2012-01-07 DIAGNOSIS — B9789 Other viral agents as the cause of diseases classified elsewhere: Secondary | ICD-10-CM | POA: Insufficient documentation

## 2012-01-07 DIAGNOSIS — M549 Dorsalgia, unspecified: Secondary | ICD-10-CM | POA: Insufficient documentation

## 2012-01-07 DIAGNOSIS — B349 Viral infection, unspecified: Secondary | ICD-10-CM

## 2012-01-07 DIAGNOSIS — K529 Noninfective gastroenteritis and colitis, unspecified: Secondary | ICD-10-CM

## 2012-01-07 DIAGNOSIS — Z8541 Personal history of malignant neoplasm of cervix uteri: Secondary | ICD-10-CM | POA: Insufficient documentation

## 2012-01-07 DIAGNOSIS — F17200 Nicotine dependence, unspecified, uncomplicated: Secondary | ICD-10-CM | POA: Insufficient documentation

## 2012-01-07 DIAGNOSIS — E785 Hyperlipidemia, unspecified: Secondary | ICD-10-CM | POA: Insufficient documentation

## 2012-01-07 DIAGNOSIS — I2510 Atherosclerotic heart disease of native coronary artery without angina pectoris: Secondary | ICD-10-CM | POA: Insufficient documentation

## 2012-01-07 DIAGNOSIS — K219 Gastro-esophageal reflux disease without esophagitis: Secondary | ICD-10-CM | POA: Insufficient documentation

## 2012-01-07 DIAGNOSIS — K5289 Other specified noninfective gastroenteritis and colitis: Secondary | ICD-10-CM | POA: Insufficient documentation

## 2012-01-07 DIAGNOSIS — G8929 Other chronic pain: Secondary | ICD-10-CM | POA: Insufficient documentation

## 2012-01-07 DIAGNOSIS — Z860100 Personal history of colon polyps, unspecified: Secondary | ICD-10-CM | POA: Insufficient documentation

## 2012-01-07 DIAGNOSIS — E860 Dehydration: Secondary | ICD-10-CM

## 2012-01-07 DIAGNOSIS — J449 Chronic obstructive pulmonary disease, unspecified: Secondary | ICD-10-CM | POA: Insufficient documentation

## 2012-01-07 DIAGNOSIS — J4489 Other specified chronic obstructive pulmonary disease: Secondary | ICD-10-CM | POA: Insufficient documentation

## 2012-01-07 LAB — LIPASE, BLOOD: Lipase: 38 U/L (ref 11–59)

## 2012-01-07 LAB — DIFFERENTIAL
Basophils Absolute: 0 10*3/uL (ref 0.0–0.1)
Lymphocytes Relative: 25 % (ref 12–46)
Monocytes Absolute: 1.3 10*3/uL — ABNORMAL HIGH (ref 0.1–1.0)
Monocytes Relative: 9 % (ref 3–12)
Neutro Abs: 8.6 10*3/uL — ABNORMAL HIGH (ref 1.7–7.7)
Neutrophils Relative %: 64 % (ref 43–77)

## 2012-01-07 LAB — CBC
HCT: 45.2 % (ref 36.0–46.0)
Hemoglobin: 15.4 g/dL — ABNORMAL HIGH (ref 12.0–15.0)
RDW: 12.7 % (ref 11.5–15.5)
WBC: 13.4 10*3/uL — ABNORMAL HIGH (ref 4.0–10.5)

## 2012-01-07 LAB — BASIC METABOLIC PANEL
CO2: 30 mEq/L (ref 19–32)
Chloride: 99 mEq/L (ref 96–112)
GFR calc Af Amer: >90 mL/min (ref >90)
Sodium: 137 mEq/L (ref 135–145)

## 2012-01-07 LAB — URINALYSIS, ROUTINE W REFLEX MICROSCOPIC
Bilirubin Urine: NEGATIVE
Nitrite: NEGATIVE
Specific Gravity, Urine: 1.005 (ref 1.005–1.030)
Urobilinogen, UA: 0.2 mg/dL (ref 0.0–1.0)
pH: 6.5 (ref 5.0–8.0)

## 2012-01-07 LAB — URINE MICROSCOPIC-ADD ON

## 2012-01-07 MED ORDER — ONDANSETRON HCL 8 MG PO TABS: 8.0000 mg | ORAL_TABLET | Freq: Three times a day (TID) | ORAL | Status: AC | PRN

## 2012-01-07 MED ORDER — SODIUM CHLORIDE 0.9 % IV SOLN: INTRAVENOUS | Status: DC

## 2012-01-07 MED ORDER — SODIUM CHLORIDE 0.9 % IV BOLUS (SEPSIS)
1000.0000 mL | Freq: Once | INTRAVENOUS | Status: AC
  Administered 2012-01-07: 1000 mL via INTRAVENOUS

## 2012-01-07 MED ORDER — ONDANSETRON HCL 4 MG/2ML IJ SOLN
4.0000 mg | Freq: Once | INTRAMUSCULAR | Status: AC
  Administered 2012-01-07: 4 mg via INTRAVENOUS
  Filled 2012-01-07: qty 2

## 2012-01-07 MED ORDER — FAMOTIDINE IN NACL 20-0.9 MG/50ML-% IV SOLN
20.0000 mg | Freq: Once | INTRAVENOUS | Status: AC
  Administered 2012-01-07: 20 mg via INTRAVENOUS
  Filled 2012-01-07: qty 50

## 2012-01-07 MED ORDER — MORPHINE SULFATE 4 MG/ML IJ SOLN
4.0000 mg | Freq: Once | INTRAMUSCULAR | Status: AC
  Administered 2012-01-07: 4 mg via INTRAVENOUS
  Filled 2012-01-07: qty 1

## 2012-01-07 MED ORDER — HYDROCODONE-ACETAMINOPHEN 5-325 MG PO TABS: 1.0000 | ORAL_TABLET | ORAL | Status: AC | PRN

## 2012-01-07 MED ORDER — FAMOTIDINE 20 MG PO TABS: 20.0000 mg | ORAL_TABLET | Freq: Two times a day (BID) | ORAL | Status: DC | PRN

## 2012-01-07 NOTE — Discharge Instructions (Signed)
B.R.A.T. Diet Your doctor has recommended the B.R.A.T. diet for you or your child until the condition improves. This is often used to help control diarrhea and vomiting symptoms. If you or your child can tolerate clear liquids, you may have:  Bananas.   Rice.   Applesauce.   Toast (and other simple starches such as crackers, potatoes, noodles).  Be sure to avoid dairy products, meats, and fatty foods until symptoms are better. Fruit juices such as apple, grape, and prune juice can make diarrhea worse. Avoid these. Continue this diet for 2 days or as instructed by your caregiver. Document Released: 09/14/2005 Document Revised: 09/03/2011 Document Reviewed: 03/03/2007 [facility] Patient Information 2012 [place].Clear Liquid Diet The clear liquid dietconsists of foods that are liquid or will become liquid at room temperature.You should be able to see through the liquid and beverages. Examples of foods allowed on a clear liquid diet include fruit juice, broth or bouillon, gelatin, or frozen ice pops. The purpose of this diet is to provide necessary fluid, electrolytes such as sodium and potassium, and energy to keep the body functioning during times when you are not able to consume a regular diet.A clear liquid diet should not be continued for long periods of time as it is not nutritionally adequate.  REASONS FOR USING A CLEAR LIQUID DIET  In sudden onset (acute) conditions for a patient before or after surgery.   As the first step in oral feeding.   For fluid and electrolyte replacement in diarrheal diseases.   As a diet before certain medical tests are performed.  ADEQUACY The clear liquid diet is adequate only in ascorbic acid, according to the Recommended Dietary Allowances of the [employer]. CHOOSING FOODS Breads and Starches  Allowed:  None are allowed.   Avoid: All are avoided.  Vegetables  Allowed:  Strained tomato or vegetable juice.   Avoid: Any  others.  Fruit  Allowed:  Strained fruit juices and fruit drinks. Include 1 serving of citrus or vitamin C-enriched fruit juice daily.   Avoid: Any others.  Meat and Meat Substitutes  Allowed:  None are allowed.   Avoid: All are avoided.  Milk  Allowed:  None are allowed.   Avoid: All are avoided.  Soups and Combination Foods  Allowed:  Clear bouillon, broth, or strained broth-based soups.   Avoid: Any others.  Desserts and Sweets  Allowed:  Sugar, honey. High protein gelatin. Flavored gelatin, ices, or frozen ice pops that do not contain milk.   Avoid: Any others.  Fats and Oils  Allowed:  None are allowed.   Avoid: All are avoided.  Beverages  Allowed: Cereal beverages, coffee (regular or decaffeinated), tea, or soda at the discretion of your caregiver.   Avoid: Any others.  Condiments  Allowed:  Iodized salt.   Avoid: Any others, including pepper.  Supplements  Allowed:  Liquid nutrition beverages.   Avoid: Any others that contain lactose or fiber.  SAMPLE MEAL PLAN Breakfast  4 oz (120 mL) strained orange juice.    to 1 cup (125 to 250 mL) gelatin (plain or fortified).   1 cup (250 mL) beverage (coffee or tea).   Sugar, if desired.  Midmorning Snack   cup (125 mL) gelatin (plain or fortified).  Lunch  1 cup (250 mL) broth or consomm.   4 oz (120 mL) strained grapefruit juice.    cup (125 mL) gelatin (plain or fortified).   1 cup (250 mL) beverage (coffee or tea).  Sugar, if desired.  Midafternoon Snack   cup (125 mL) fruit ice.    cup (125 mL) strained fruit juice.  Dinner  1 cup (250 mL) broth or consomm.    cup (125 mL) cranberry juice.    cup (125 mL) flavored gelatin (plain or fortified).   1 cup (250 mL) beverage (coffee or tea).   Sugar, if desired.  Evening Snack  4 oz (120 mL) strained apple juice (vitamin C-fortified).    cup (125 mL) flavored gelatin (plain or fortified).  Document Released: 09/14/2005  Document Revised: 09/03/2011 Document Reviewed: 12/12/2010 [facility] Patient Information 2012 [place], [place].Dehydration, Adult Dehydration is when you lose more fluids from the body than you take in. Vital organs like the kidneys, brain, and heart cannot function without a proper amount of fluids and salt. Any loss of fluids from the body can cause dehydration.  CAUSES   Vomiting.   Diarrhea.   Excessive sweating.   Excessive urine output.   Fever.  SYMPTOMS  Mild dehydration  Thirst.   Dry lips.   Slightly dry mouth.  Moderate dehydration  Very dry mouth.   Sunken eyes.   Skin does not bounce back quickly when lightly pinched and released.   Dark urine and decreased urine production.   Decreased tear production.   Headache.  Severe dehydration  Very dry mouth.   Extreme thirst.   Rapid, weak pulse (more than 100 beats per minute at rest).   Cold hands and feet.   Not able to sweat in spite of heat and temperature.   Rapid breathing.   Blue lips.   Confusion and lethargy.   Difficulty being awakened.   Minimal urine production.   No tears.  DIAGNOSIS  Your caregiver will diagnose dehydration based on your symptoms and your exam. Blood and urine tests will help confirm the diagnosis. The diagnostic evaluation should also identify the cause of dehydration. TREATMENT  Treatment of mild or moderate dehydration can often be done at home by increasing the amount of fluids that you drink. It is best to drink small amounts of fluid more often. Drinking too much at one time can make vomiting worse. Refer to the home care instructions below. Severe dehydration needs to be treated at the hospital where you will probably be given intravenous (IV) fluids that contain water and electrolytes. HOME CARE INSTRUCTIONS   Ask your caregiver about specific rehydration instructions.   Drink enough fluids to keep your urine clear or pale yellow.   Drink small amounts  frequently if you have nausea and vomiting.   Eat as you normally do.   Avoid:   Foods or drinks high in sugar.   Carbonated drinks.   Juice.   Extremely hot or cold fluids.   Drinks with caffeine.   Fatty, greasy foods.   Alcohol.   Tobacco.   Overeating.   Gelatin desserts.   Wash your hands well to avoid spreading bacteria and viruses.   Only take over-the-counter or prescription medicines for pain, discomfort, or fever as directed by your caregiver.   Ask your caregiver if you should continue all prescribed and over-the-counter medicines.   Keep all follow-up appointments with your caregiver.  SEEK MEDICAL CARE IF:  You have abdominal pain and it increases or stays in one area (localizes).   You have a rash, stiff neck, or severe headache.   You are irritable, sleepy, or difficult to awaken.   You are weak, dizzy, or extremely thirsty.  SEEK  IMMEDIATE MEDICAL CARE IF:   You are unable to keep fluids down or you get worse despite treatment.   You have frequent episodes of vomiting or diarrhea.   You have blood or green matter (bile) in your vomit.   You have blood in your stool or your stool looks black and tarry.   You have not urinated in 6 to 8 hours, or you have only urinated a small amount of very dark urine.   You have a fever.   You faint.  MAKE SURE YOU:   Understand these instructions.   Will watch your condition.   Will get help right away if you are not doing well or get worse.  Document Released: 09/14/2005 Document Revised: 09/03/2011 Document Reviewed: 05/04/2011 [facility] Patient Information 2012 [place], [place].Viral Gastroenteritis Viral gastroenteritis is also known as stomach flu. This condition affects the stomach and intestinal tract. It can cause sudden diarrhea and vomiting. The illness typically lasts 3 to 8 days. Most people develop an immune response that eventually gets rid of the virus. While this natural response  develops, the virus can make you quite ill. CAUSES  Many different viruses can cause gastroenteritis, such as rotavirus or noroviruses. You can catch one of these viruses by consuming contaminated food or water. You may also catch a virus by sharing utensils or other personal items with an infected person or by touching a contaminated surface. SYMPTOMS  The most common symptoms are diarrhea and vomiting. These problems can cause a severe loss of body fluids (dehydration) and a body salt (electrolyte) imbalance. Other symptoms may include:  Fever.   Headache.   Fatigue.   Abdominal pain.  DIAGNOSIS  Your caregiver can usually diagnose viral gastroenteritis based on your symptoms and a physical exam. A stool sample may also be taken to test for the presence of viruses or other infections. TREATMENT  This illness typically goes away on its own. Treatments are aimed at rehydration. The most serious cases of viral gastroenteritis involve vomiting so severely that you are not able to keep fluids down. In these cases, fluids must be given through an intravenous line (IV). HOME CARE INSTRUCTIONS   Drink enough fluids to keep your urine clear or pale yellow. Drink small amounts of fluids frequently and increase the amounts as tolerated.   Ask your caregiver for specific rehydration instructions.   Avoid:   Foods high in sugar.   Alcohol.   Carbonated drinks.   Tobacco.   Juice.   Caffeine drinks.   Extremely hot or cold fluids.   Fatty, greasy foods.   Too much intake of anything at one time.   Dairy products until 24 to 48 hours after diarrhea stops.   You may consume probiotics. Probiotics are active cultures of beneficial bacteria. They may lessen the amount and number of diarrheal stools in adults. Probiotics can be found in yogurt with active cultures and in supplements.   Wash your hands well to avoid spreading the virus.   Only take over-the-counter or prescription  medicines for pain, discomfort, or fever as directed by your caregiver. Do not give aspirin to children. Antidiarrheal medicines are not recommended.   Ask your caregiver if you should continue to take your regular prescribed and over-the-counter medicines.   Keep all follow-up appointments as directed by your caregiver.  SEEK IMMEDIATE MEDICAL CARE IF:   You are unable to keep fluids down.   You do not urinate at least once every 6 to 8 hours.  You develop shortness of breath.   You notice blood in your stool or vomit. This may look like coffee grounds.   You have abdominal pain that increases or is concentrated in one small area (localized).   You have persistent vomiting or diarrhea.   You have a fever.   The patient is a child younger than 3 months, and he or she has a fever.   The patient is a child older than 3 months, and he or she has a fever and persistent symptoms.   The patient is a child older than 3 months, and he or she has a fever and symptoms suddenly get worse.   The patient is a baby, and he or she has no tears when crying.  MAKE SURE YOU:   Understand these instructions.   Will watch your condition.   Will get help right away if you are not doing well or get worse.  Document Released: 09/14/2005 Document Revised: 09/03/2011 Document Reviewed: 07/01/2011 [facility] Patient Information 2012 [place], [place].

## 2012-01-07 NOTE — ED Provider Notes (Signed)
History  This chart was scribed for Felisa Bonier, MD by Bennett Scrape and Cherlynn Perches. This patient was seen in room [facility] and the patient's care was started at 9:56AM.   CSN: [id number]  Arrival date & time 01/07/12  4098   First MD Initiated Contact with Patient 01/07/12 0825      Chief Complaint  Patient presents with  . Emesis  . Diarrhea     The history is provided by the patient. No language interpreter was used.    [name] is a 66 y.o. female with a h/o pancreatitis who presents to the Emergency Department complaining of 6 hours of gradual onset, gradually worsening, constant emesis with associated chills, nausea and diarrhea. She reports several episodes of non-bloody emesis and non-bloody diarrhea since she woke up this morning. She states that she has not been able to keep food or liquid down since the onset of the symptoms.  Pt states that she had RUQ abdominal pain yesterday but states that this has since resolved. She denies any modifying factors. She has not taken any medications PTA to improve symptoms. She denies having any known sick contacts. Pt states that she is unsure if the symptoms are due to her h/o pancreatitis or to another illness. She denies fever, congestion, sore throat, cough, chest pain, dysuria, frequency, HA and confusion as associated symptoms. Pt has h/o CAD, GERD, COPD, and hyperlipidemia. Pt smokes 1 pack/day and denies alcohol use.    Past Medical History  Diagnosis Date  . CAD (coronary artery disease)     palpitations, dizziness, chest pain  . Hx of colonic polyps     adenomatous  . Tobacco abuse   . Back pain, chronic   . Cervical cancer   . Allergic rhinitis   . Anxiety and depression   . Nephrolithiasis   . Pancreatitis chronic   . Hematochezia   . GERD (gastroesophageal reflux disease)   . COPD (chronic obstructive pulmonary disease)   . Weight loss   . Renal calculus   . Hyperlipidemia     Lipid profile  on 02/25/2011: 209, 113, 55, 132    Past Surgical History  Procedure Date  . Partial hysterectomy   . Appendectomy   . Colonoscopy w/ polypectomy 2006    hemorrhoids, benign gastric nodule, multiple adenomatous polyps, one with tubular morphology    Family History  Problem Relation Age of Onset  . Heart attack Father 26    History  Substance Use Topics  . Smoking status: Current Everyday Smoker -- 1.0 packs/day    Types: Cigarettes  . Smokeless tobacco: Never Used  . Alcohol Use: No    Review of Systems  Constitutional: Positive for chills. Negative for fever.  HENT: Negative for congestion, sore throat and neck pain.   Eyes: Negative for pain.  Respiratory: Negative for cough and shortness of breath.   Cardiovascular: Negative for chest pain.  Gastrointestinal: Positive for nausea, vomiting and diarrhea. Negative for abdominal pain.  Genitourinary: Negative for dysuria, urgency and hematuria.  Musculoskeletal: Negative for back pain.  Skin: Negative for rash.  Neurological: Negative for seizures and headaches.  Psychiatric/Behavioral: Negative for confusion.    Allergies  Ibuprofen and Iohexol  Home Medications   Current Outpatient Rx  Name Route Sig Dispense Refill  . ACETAMINOPHEN 500 MG PO TABS Oral Take 500 mg by mouth as needed. For pain    . ALENDRONATE SODIUM 70 MG PO TABS Oral Take 70 mg by mouth  every 7 (seven) days. Take with a full glass of water on an empty stomach.     . ASPIRIN EFFERVESCENT 325 MG PO TBEF Oral Take 325 mg by mouth as needed. For relief     . CALCIUM 600 PO Oral Take 1 tablet by mouth daily.      Marland Kitchen DOCUSATE SODIUM 100 MG PO CAPS Oral Take 100 mg by mouth daily.     . DULOXETINE HCL 60 MG PO CPEP Oral Take 60 mg by mouth daily.      . FENTANYL 100 MCG/HR TD PT72 Transdermal Place 1 patch onto the skin every 3 (three) days.      Marland Kitchen FLUTICASONE-SALMETEROL 100-50 MCG/DOSE IN AEPB Inhalation Inhale 1 puff into the lungs every 12 (twelve)  hours.      Marland Kitchen LORAZEPAM 0.5 MG PO TABS Oral Take 0.5 mg by mouth every 4 (four) hours as needed. Take every 4 to 6 hours as needed for nerves    . METOPROLOL TARTRATE 50 MG PO TABS Oral Take 25 mg by mouth 2 (two) times daily.      Marland Kitchen NITROGLYCERIN 0.4 MG SL SUBL Sublingual Place 0.4 mg under the tongue every 5 (five) minutes as needed.      Marland Kitchen OMEPRAZOLE 20 MG PO CPDR  TAKE ONE CAPSULE DAILY. 30 capsule 11  . PANCRELIPASE (LIP-PROT-AMYL) 24000 UNITS PO CPEP Oral Take 1 capsule by mouth 3 (three) times daily. 2 tabs with meals 1 with snacks     . POTASSIUM CHLORIDE 20 MEQ PO PACK Oral Take 20 mEq by mouth daily. 30 tablet 30  . POTASSIUM CHLORIDE CRYS ER 20 MEQ PO TBCR Oral Take 1 tablet (20 mEq total) by mouth daily.      Triage Vitals: BP 147/109  Pulse 74  Temp(Src) 98 F (36.7 C) (Oral)  Resp 18  Ht 5\' 4"  (1.626 m)  Wt 82 lb (37.195 kg)  BMI 14.08 kg/m2  SpO2 96%  Physical Exam  Nursing note and vitals reviewed. Constitutional: She is oriented to person, place, and time.       Pt appears frail  HENT:  Head: Normocephalic and atraumatic.       Mucous membranes slightly dry, normal oropharynx without erythema or edema  Eyes: Conjunctivae and EOM are normal. Pupils are equal, round, and reactive to light. No scleral icterus.  Neck: Normal range of motion. Neck supple. No JVD present. No tracheal deviation (trachea midline) present.  Cardiovascular: Normal rate, regular rhythm and normal heart sounds.  Exam reveals no gallop and no friction rub.   No murmur heard. Pulmonary/Chest: Effort normal and breath sounds normal. No respiratory distress. She has no wheezes. She has no rales.       Lungs are clear to auscultation, No rhonchi  Abdominal: Soft. Bowel sounds are normal. She exhibits no distension. There is tenderness (epigastric tenderness). There is guarding (mild guarding). There is no rebound.  Musculoskeletal: Normal range of motion. She exhibits no edema (no edema to lower  extremities).  Neurological: She is alert and oriented to person, place, and time.  Skin: Skin is warm and dry.  Psychiatric: She has a normal mood and affect. Her behavior is normal.    ED Course  Procedures (including critical care time)  DIAGNOSTIC STUDIES: Oxygen Saturation is 96% on room air, adequate by my interpretation.    COORDINATION OF CARE: 10:AM-Pt states that she feels improved after medications administered in the ED. Discussed x-ray of abdomen with pt and pt  agreed. Will do fluid trial and discharge pt with medications for nausea and antiacid if x-ray is negative.   Labs Reviewed  BASIC METABOLIC PANEL - Abnormal; Notable for the following:    Creatinine, Ser 0.47 (*)    All other components within normal limits  CBC - Abnormal; Notable for the following:    WBC 13.4 (*)    Hemoglobin 15.4 (*)    All other components within normal limits  DIFFERENTIAL - Abnormal; Notable for the following:    Neutro Abs 8.6 (*)    Monocytes Absolute 1.3 (*)    All other components within normal limits  URINALYSIS, ROUTINE W REFLEX MICROSCOPIC - Abnormal; Notable for the following:    Hgb urine dipstick SMALL (*)    All other components within normal limits  LIPASE, BLOOD  URINE MICROSCOPIC-ADD ON   No results found.   No diagnosis found.    MDM  The patient does have a history of pancreatitis, but at the time of evaluation she had art he received IV fluids, anti-emetics, antacids, and low-dose analgesics, and reports that her symptoms are "gone". She denies any abdominal pain persisting, and is tolerating oral fluid intake well. Evaluation of her hepatic enzymes and lipase for acute inflammation of the liver or pancreas is negative for suggestion of such. The patient appears to have viral gastroenteritis, which is currently epidemic in our region. She has been rehydrated and symptoms are controlled with medications. I will discharge the patient home. Patient states her  understanding of and agreement with the plan of care.   I personally performed the services described in this documentation, which was scribed in my presence. The recorded information has been reviewed and considered.      Felisa Bonier, MD 01/07/12 [phone]

## 2012-01-07 NOTE — ED Notes (Signed)
Pt unable to urinate at this time.  

## 2012-01-07 NOTE — ED Notes (Signed)
Pt c/o "not feeling well since yesterday". N/v/d started early this am with hot/cold chills. nad at this time. Mm dry. Was hurting to ruq yesterday but no pain today. Denies gu changes. Alert/oriented. Iv started in route.

## 2012-01-18 ENCOUNTER — Encounter (HOSPITAL_COMMUNITY): Payer: Self-pay | Admitting: *Deleted

## 2012-01-18 DIAGNOSIS — F329 Major depressive disorder, single episode, unspecified: Secondary | ICD-10-CM | POA: Insufficient documentation

## 2012-01-18 DIAGNOSIS — R1110 Vomiting, unspecified: Secondary | ICD-10-CM | POA: Insufficient documentation

## 2012-01-18 DIAGNOSIS — J4489 Other specified chronic obstructive pulmonary disease: Secondary | ICD-10-CM | POA: Insufficient documentation

## 2012-01-18 DIAGNOSIS — R197 Diarrhea, unspecified: Secondary | ICD-10-CM | POA: Insufficient documentation

## 2012-01-18 DIAGNOSIS — I2510 Atherosclerotic heart disease of native coronary artery without angina pectoris: Secondary | ICD-10-CM | POA: Insufficient documentation

## 2012-01-18 DIAGNOSIS — J449 Chronic obstructive pulmonary disease, unspecified: Secondary | ICD-10-CM | POA: Insufficient documentation

## 2012-01-18 DIAGNOSIS — F411 Generalized anxiety disorder: Secondary | ICD-10-CM | POA: Insufficient documentation

## 2012-01-18 DIAGNOSIS — K219 Gastro-esophageal reflux disease without esophagitis: Secondary | ICD-10-CM | POA: Insufficient documentation

## 2012-01-18 DIAGNOSIS — F3289 Other specified depressive episodes: Secondary | ICD-10-CM | POA: Insufficient documentation

## 2012-01-18 DIAGNOSIS — F17200 Nicotine dependence, unspecified, uncomplicated: Secondary | ICD-10-CM | POA: Insufficient documentation

## 2012-01-18 NOTE — ED Notes (Addendum)
C/o n/v/d onset 2230 tonight; reports hx of pancreatitis and states that these s/s are similar to previous attacks; denies abd pain. States took Phenergan tab pta, which has helped with n/v.

## 2012-01-19 ENCOUNTER — Emergency Department (HOSPITAL_COMMUNITY)
Admission: EM | Admit: 2012-01-19 | Discharge: 2012-01-19 | Disposition: A | Discharge status: Home / Self Care | Payer: PRIVATE HEALTH INSURANCE | Attending: Emergency Medicine | Admitting: Emergency Medicine

## 2012-01-19 DIAGNOSIS — K219 Gastro-esophageal reflux disease without esophagitis: Secondary | ICD-10-CM

## 2012-01-19 LAB — BASIC METABOLIC PANEL
CO2: 31 mEq/L (ref 19–32)
Calcium: 10.9 mg/dL — ABNORMAL HIGH (ref 8.4–10.5)
Creatinine, Ser: 0.46 mg/dL — ABNORMAL LOW (ref 0.50–1.10)
GFR calc Af Amer: >90 mL/min (ref >90)
GFR calc non Af Amer: >90 mL/min (ref >90)
Sodium: 141 mEq/L (ref 135–145)

## 2012-01-19 MED ORDER — HYDROMORPHONE HCL PF 1 MG/ML IJ SOLN
1.0000 mg | Freq: Once | INTRAMUSCULAR | Status: AC
  Administered 2012-01-19: 1 mg via INTRAVENOUS
  Filled 2012-01-19: qty 1

## 2012-01-19 MED ORDER — PANTOPRAZOLE SODIUM 40 MG IV SOLR
40.0000 mg | Freq: Once | INTRAVENOUS | Status: AC
  Administered 2012-01-19: 40 mg via INTRAVENOUS
  Filled 2012-01-19: qty 40

## 2012-01-19 MED ORDER — SODIUM CHLORIDE 0.9 % IV BOLUS (SEPSIS)
1000.0000 mL | Freq: Once | INTRAVENOUS | Status: AC
  Administered 2012-01-19: 1000 mL via INTRAVENOUS

## 2012-01-19 MED ORDER — SUCRALFATE 1 G PO TABS: 1.0000 g | ORAL_TABLET | Freq: Four times a day (QID) | ORAL | Status: DC

## 2012-01-19 MED ORDER — ONDANSETRON HCL 4 MG/2ML IJ SOLN
4.0000 mg | Freq: Once | INTRAMUSCULAR | Status: AC
  Administered 2012-01-19: 4 mg via INTRAVENOUS
  Filled 2012-01-19: qty 2

## 2012-01-19 NOTE — ED Provider Notes (Addendum)
History     CSN: [id number]  Arrival date & time 01/18/12  2335   First MD Initiated Contact with Patient 01/19/12 0327      Chief Complaint  Patient presents with  . Emesis  . Diarrhea    (Consider location/radiation/quality/duration/timing/severity/associated sxs/prior treatment) Patient is a 66 y.o. female presenting with vomiting and diarrhea. The history is provided by the patient (pt complains of burning in upper abd.). No language interpreter was used.  Emesis  This is a recurrent problem. The current episode started 3 to 5 hours ago. The problem occurs 2 to 4 times per day. The problem has not changed since onset.There has been no fever. Associated symptoms include abdominal pain, chills and diarrhea. Pertinent negatives include no cough and no headaches. Risk factors: none.  Diarrhea The primary symptoms include abdominal pain, vomiting and diarrhea. Primary symptoms do not include fatigue or rash.  The illness is also significant for chills. The illness does not include back pain.    Past Medical History  Diagnosis Date  . CAD (coronary artery disease)     palpitations, dizziness, chest pain  . Hx of colonic polyps     adenomatous  . Tobacco abuse   . Back pain, chronic   . Cervical cancer   . Allergic rhinitis   . Anxiety and depression   . Nephrolithiasis   . Pancreatitis chronic   . Hematochezia   . GERD (gastroesophageal reflux disease)   . COPD (chronic obstructive pulmonary disease)   . Weight loss   . Renal calculus   . Hyperlipidemia     Lipid profile on 02/25/2011: 209, 113, 55, 132    Past Surgical History  Procedure Date  . Partial hysterectomy   . Appendectomy   . Colonoscopy w/ polypectomy 2006    hemorrhoids, benign gastric nodule, multiple adenomatous polyps, one with tubular morphology    Family History  Problem Relation Age of Onset  . Heart attack Father 53    History  Substance Use Topics  . Smoking status: Current Everyday  Smoker -- 1.0 packs/day    Types: Cigarettes  . Smokeless tobacco: Never Used  . Alcohol Use: No    OB History    Grav Para Term Preterm Abortions TAB SAB Ect Mult Living                  Review of Systems  Constitutional: Positive for chills. Negative for fatigue.  HENT: Negative for congestion, sinus pressure and ear discharge.   Eyes: Negative for discharge.  Respiratory: Negative for cough.   Cardiovascular: Negative for chest pain.  Gastrointestinal: Positive for vomiting, abdominal pain and diarrhea.  Genitourinary: Negative for frequency and hematuria.  Musculoskeletal: Negative for back pain.  Skin: Negative for rash.  Neurological: Negative for seizures and headaches.  Hematological: Negative.   Psychiatric/Behavioral: Negative for hallucinations.    Allergies  Iohexol and Ibuprofen  Home Medications   Current Outpatient Rx  Name Route Sig Dispense Refill  . ACETAMINOPHEN 500 MG PO TABS Oral Take 500 mg by mouth as needed. For pain    . ALENDRONATE SODIUM 70 MG PO TABS Oral Take 70 mg by mouth every 7 (seven) days. Take with a full glass of water on an empty stomach. Patient take on Tuesday    . CALCIUM 600 PO Oral Take 1 tablet by mouth daily.      Marland Kitchen DOCUSATE SODIUM 100 MG PO CAPS Oral Take 100 mg by mouth daily.     Marland Kitchen  DULOXETINE HCL 60 MG PO CPEP Oral Take 60 mg by mouth daily.      Marland Kitchen FAMOTIDINE 20 MG PO TABS Oral Take 1 tablet (20 mg total) by mouth 2 (two) times daily as needed for heartburn. 14 tablet 0  . FENTANYL 100 MCG/HR TD PT72 Transdermal Place 1 patch onto the skin every 3 (three) days. Patient put patch on Monday night will change tonight    . FLUTICASONE-SALMETEROL 100-50 MCG/DOSE IN AEPB Inhalation Inhale 1 puff into the lungs every 12 (twelve) hours.      Marland Kitchen LORAZEPAM 0.5 MG PO TABS Oral Take 0.5 mg by mouth every 4 (four) hours as needed. Take every 4 to 6 hours as needed for nerves    . METOPROLOL TARTRATE 50 MG PO TABS Oral Take 25 mg by mouth 2  (two) times daily.      Marland Kitchen NITROGLYCERIN 0.4 MG SL SUBL Sublingual Place 0.4 mg under the tongue every 5 (five) minutes as needed.      Marland Kitchen OMEPRAZOLE 20 MG PO CPDR  TAKE ONE CAPSULE DAILY. 30 capsule 11  . PANCRELIPASE (LIP-PROT-AMYL) 24000 UNITS PO CPEP Oral Take 1 capsule by mouth 3 (three) times daily. 2 tabs with meals 1 with snacks     . SUCRALFATE 1 G PO TABS Oral Take 1 tablet (1 g total) by mouth 4 (four) times daily. 120 tablet 1    BP 156/102  Pulse 89  Temp(Src) 97.8 F (36.6 C) (Oral)  Resp 20  Ht 5\' 4"  (1.626 m)  Wt 82 lb (37.195 kg)  BMI 14.08 kg/m2  SpO2 100%  Physical Exam  Constitutional: She is oriented to person, place, and time. She appears well-developed.  HENT:  Head: Normocephalic and atraumatic.  Eyes: Conjunctivae and EOM are normal. No scleral icterus.  Neck: Neck supple. No thyromegaly present.  Cardiovascular: Normal rate and regular rhythm.  Exam reveals no gallop and no friction rub.   No murmur heard. Pulmonary/Chest: No stridor. She has no wheezes. She has no rales. She exhibits no tenderness.  Abdominal: She exhibits no distension. There is tenderness. There is no rebound.       Tender epigastric  Musculoskeletal: Normal range of motion. She exhibits no edema.  Lymphadenopathy:    She has no cervical adenopathy.  Neurological: She is oriented to person, place, and time. Coordination normal.  Skin: No rash noted. No erythema.  Psychiatric: She has a normal mood and affect. Her behavior is normal.    ED Course  Procedures (including critical care time)  Labs Reviewed  BASIC METABOLIC PANEL - Abnormal; Notable for the following:    Glucose, Bld 111 (*)    Creatinine, Ser 0.46 (*)    Calcium 10.9 (*)    All other components within normal limits   No results found.   1. GERD (gastroesophageal reflux disease)       MDM          Benny Lennert, MD 01/19/12 4782  Benny Lennert, MD 01/19/12 [phone]

## 2012-01-19 NOTE — Discharge Instructions (Signed)
Follow up with your md this week if not improving. 

## 2012-01-19 NOTE — ED Notes (Signed)
Discharge instructions reviewed with pt, questions answered. Pt verbalized understanding.  

## 2012-01-19 NOTE — ED Notes (Signed)
MD at bedside. 

## 2012-02-06 ENCOUNTER — Other Ambulatory Visit: Payer: Self-pay | Admitting: Gastroenterology

## 2012-02-08 NOTE — Telephone Encounter (Signed)
Pt should come back in for follow-up around winter 2013. If having issues, need to offer visit sooner.

## 2012-02-08 NOTE — Telephone Encounter (Signed)
Pt should come back in for follow-up around winter 2013. If having issues, need to offer visit sooner.  

## 2012-02-12 ENCOUNTER — Encounter (HOSPITAL_COMMUNITY): Payer: Self-pay | Admitting: *Deleted

## 2012-02-12 ENCOUNTER — Emergency Department (HOSPITAL_COMMUNITY): Payer: PRIVATE HEALTH INSURANCE

## 2012-02-12 ENCOUNTER — Emergency Department (HOSPITAL_COMMUNITY)
Admission: EM | Admit: 2012-02-12 | Discharge: 2012-02-12 | Disposition: A | Discharge status: Home / Self Care | Payer: PRIVATE HEALTH INSURANCE | Attending: Emergency Medicine | Admitting: Emergency Medicine

## 2012-02-12 DIAGNOSIS — R51 Headache: Secondary | ICD-10-CM | POA: Insufficient documentation

## 2012-02-12 DIAGNOSIS — I2510 Atherosclerotic heart disease of native coronary artery without angina pectoris: Secondary | ICD-10-CM | POA: Insufficient documentation

## 2012-02-12 DIAGNOSIS — M545 Low back pain, unspecified: Secondary | ICD-10-CM | POA: Insufficient documentation

## 2012-02-12 DIAGNOSIS — R197 Diarrhea, unspecified: Secondary | ICD-10-CM | POA: Insufficient documentation

## 2012-02-12 DIAGNOSIS — J029 Acute pharyngitis, unspecified: Secondary | ICD-10-CM | POA: Insufficient documentation

## 2012-02-12 DIAGNOSIS — J3489 Other specified disorders of nose and nasal sinuses: Secondary | ICD-10-CM | POA: Insufficient documentation

## 2012-02-12 DIAGNOSIS — R110 Nausea: Secondary | ICD-10-CM | POA: Insufficient documentation

## 2012-02-12 DIAGNOSIS — M542 Cervicalgia: Secondary | ICD-10-CM | POA: Insufficient documentation

## 2012-02-12 DIAGNOSIS — R0602 Shortness of breath: Secondary | ICD-10-CM | POA: Insufficient documentation

## 2012-02-12 DIAGNOSIS — E785 Hyperlipidemia, unspecified: Secondary | ICD-10-CM | POA: Insufficient documentation

## 2012-02-12 DIAGNOSIS — F17200 Nicotine dependence, unspecified, uncomplicated: Secondary | ICD-10-CM | POA: Insufficient documentation

## 2012-02-12 DIAGNOSIS — R21 Rash and other nonspecific skin eruption: Secondary | ICD-10-CM | POA: Insufficient documentation

## 2012-02-12 DIAGNOSIS — R109 Unspecified abdominal pain: Secondary | ICD-10-CM | POA: Insufficient documentation

## 2012-02-12 LAB — DIFFERENTIAL
Basophils Absolute: 0 10*3/uL (ref 0.0–0.1)
Basophils Relative: 0 % (ref 0–1)
Monocytes Absolute: 0.5 10*3/uL (ref 0.1–1.0)
Neutro Abs: 6 10*3/uL (ref 1.7–7.7)
Neutrophils Relative %: 71 % (ref 43–77)

## 2012-02-12 LAB — CBC
MCHC: 35 g/dL (ref 30.0–36.0)
RDW: 12.5 % (ref 11.5–15.5)

## 2012-02-12 LAB — URINALYSIS, ROUTINE W REFLEX MICROSCOPIC
Ketones, ur: NEGATIVE mg/dL
Leukocytes, UA: NEGATIVE
Nitrite: NEGATIVE
Protein, ur: NEGATIVE mg/dL
Urobilinogen, UA: 0.2 mg/dL (ref 0.0–1.0)

## 2012-02-12 LAB — COMPREHENSIVE METABOLIC PANEL
Alkaline Phosphatase: 82 U/L (ref 39–117)
BUN: 6 mg/dL (ref 6–23)
Chloride: 102 mEq/L (ref 96–112)
GFR calc Af Amer: >90 mL/min (ref >90)
Glucose, Bld: 88 mg/dL (ref 70–99)
Potassium: 3.5 mEq/L (ref 3.5–5.1)
Total Bilirubin: 0.2 mg/dL — ABNORMAL LOW (ref 0.3–1.2)

## 2012-02-12 LAB — URINE MICROSCOPIC-ADD ON

## 2012-02-12 MED ORDER — IOHEXOL 300 MG/ML  SOLN
100.0000 mL | Freq: Once | INTRAMUSCULAR | Status: AC | PRN
  Administered 2012-02-12: 100 mL via INTRAVENOUS

## 2012-02-12 MED ORDER — DIPHENHYDRAMINE HCL 50 MG/ML IJ SOLN
50.0000 mg | Freq: Once | INTRAMUSCULAR | Status: AC
  Administered 2012-02-12: 50 mg via INTRAVENOUS
  Filled 2012-02-12: qty 1

## 2012-02-12 MED ORDER — METHYLPREDNISOLONE SODIUM SUCC 125 MG IJ SOLR
125.0000 mg | Freq: Once | INTRAMUSCULAR | Status: AC
  Administered 2012-02-12: 125 mg via INTRAVENOUS
  Filled 2012-02-12: qty 2

## 2012-02-12 MED ORDER — ONDANSETRON HCL 4 MG PO TABS: 4.0000 mg | ORAL_TABLET | Freq: Four times a day (QID) | ORAL | Status: DC

## 2012-02-12 MED ORDER — SODIUM CHLORIDE 0.9 % IV SOLN: INTRAVENOUS | Status: DC

## 2012-02-12 MED ORDER — HYDROCODONE-ACETAMINOPHEN 5-325 MG PO TABS: 1.0000 | ORAL_TABLET | Freq: Four times a day (QID) | ORAL | Status: DC | PRN

## 2012-02-12 MED ORDER — ONDANSETRON HCL 4 MG/2ML IJ SOLN
4.0000 mg | Freq: Once | INTRAMUSCULAR | Status: AC
  Administered 2012-02-12: 4 mg via INTRAVENOUS
  Filled 2012-02-12: qty 2

## 2012-02-12 MED ORDER — SODIUM CHLORIDE 0.9 % IV BOLUS (SEPSIS)
250.0000 mL | Freq: Once | INTRAVENOUS | Status: AC
  Administered 2012-02-12: 250 mL via INTRAVENOUS

## 2012-02-12 NOTE — ED Provider Notes (Signed)
History  This chart was scribed for Shelda Jakes, MD by Stevphen Meuse. This patient was seen in room APA03/[facility]03 and the patient's care was started at 12:15PM.    CSN: [id number]  Arrival date & time 02/12/12  1135   First MD Initiated Contact with Patient 02/12/12 1138      Chief Complaint  Patient presents with  . Headache  . Rash  . Flank Pain    (Consider location/radiation/quality/duration/timing/severity/associated sxs/prior treatment) The history is provided by the patient. No language interpreter was used.   JOSCLYN ROSALES is a 66 y.o. female who presents to the Emergency Department complaining of gradual onset gradually worsening non radiating HA centered in her forehead and temple area with associated rash and abdominal pain. Pt states that her HA onset was at 6AM this morning and rates her pain to be a 7/10. Pt also states that she has constant abdominal pain radiating lightly to her lower left back onset 8:30AM this morning, she rates the pain as a 7/10. She states that she has rash is currently on both legs and her right arm. She states that she has had previous episodes of the rash and was not put on medication. In that previous episode she states that her rash lasted for 3 weeks. Pt denies taking any medication for her episodes of rash outbreak. Pt denies any modifying factors. Pt denies fever, cough chest pain, vomiting and bleeding easily as associated symptoms. Pt also reports neck pain, congestion, sore throat, SOB, diarrhea, nausea and back pain as associated symptoms.Pt has a h/o CAD, cervical cancer, nephrolithiasis, GERD, COPD, chronic back pain and Hyperlipidemia. Pt is a current every day smoker but denies a h/o alcohol use.  Pt PCP Dr. Renard Matter   Past Medical History  Diagnosis Date  . CAD (coronary artery disease)     palpitations, dizziness, chest pain  . Hx of colonic polyps     adenomatous  . Tobacco abuse   . Back pain, chronic   . Cervical  cancer   . Allergic rhinitis   . Anxiety and depression   . Nephrolithiasis   . Pancreatitis chronic   . Hematochezia   . GERD (gastroesophageal reflux disease)   . COPD (chronic obstructive pulmonary disease)   . Weight loss   . Renal calculus   . Hyperlipidemia     Lipid profile on 02/25/2011: 209, 113, 55, 132    Past Surgical History  Procedure Date  . Partial hysterectomy   . Appendectomy   . Colonoscopy w/ polypectomy 2006    hemorrhoids, benign gastric nodule, multiple adenomatous polyps, one with tubular morphology    Family History  Problem Relation Age of Onset  . Heart attack Father 57    History  Substance Use Topics  . Smoking status: Current Everyday Smoker -- 1.0 packs/day    Types: Cigarettes  . Smokeless tobacco: Never Used  . Alcohol Use: No    OB History    Grav Para Term Preterm Abortions TAB SAB Ect Mult Living                  Review of Systems  Constitutional: Negative for fever.  HENT: Positive for congestion, sore throat and neck pain.   Eyes: Negative for visual disturbance.  Respiratory: Positive for shortness of breath. Negative for cough.   Cardiovascular: Negative for chest pain.  Gastrointestinal: Positive for nausea, abdominal pain and diarrhea (last night). Negative for vomiting.  Genitourinary: Negative for dysuria.  Musculoskeletal:  Positive for back pain.  Skin: Positive for rash (left leg).  Neurological: Positive for dizziness and headaches (onset 6AM in the forehead and temple).  Hematological: Does not bruise/bleed easily.    Allergies  Iohexol and Ibuprofen  Home Medications   Current Outpatient Rx  Name Route Sig Dispense Refill  . ACETAMINOPHEN 500 MG PO TABS Oral Take 500 mg by mouth as needed. For pain    . ALENDRONATE SODIUM 70 MG PO TABS Oral Take 70 mg by mouth every 7 (seven) days. Take with a full glass of water on an empty stomach. Patient take on Tuesday    . CALCIUM 600 PO Oral Take 1 tablet by mouth  daily.      Marland Kitchen CREON 24000 UNITS PO CPEP  TAKE 2 CAPSULES WITH EACH MEALS AND 1 CAPSULE WITH SNACKS. 240 each 5  . DOCUSATE SODIUM 100 MG PO CAPS Oral Take 100 mg by mouth daily.     . DULOXETINE HCL 60 MG PO CPEP Oral Take 60 mg by mouth daily.      Marland Kitchen FAMOTIDINE 20 MG PO TABS Oral Take 1 tablet (20 mg total) by mouth 2 (two) times daily as needed for heartburn. 14 tablet 0  . FENTANYL 100 MCG/HR TD PT72 Transdermal Place 1 patch onto the skin every 3 (three) days. Patient put patch on Monday night will change tonight    . FLUTICASONE-SALMETEROL 100-50 MCG/DOSE IN AEPB Inhalation Inhale 1 puff into the lungs every 12 (twelve) hours.      Marland Kitchen LORAZEPAM 0.5 MG PO TABS Oral Take 0.5 mg by mouth every 4 (four) hours as needed. Take every 4 to 6 hours as needed for nerves    . METOPROLOL TARTRATE 50 MG PO TABS Oral Take 25 mg by mouth 2 (two) times daily.      Marland Kitchen OMEPRAZOLE 20 MG PO CPDR  TAKE ONE CAPSULE DAILY. 30 capsule 11  . SUCRALFATE 1 G PO TABS Oral Take 1 tablet (1 g total) by mouth 4 (four) times daily. 120 tablet 1  . HYDROCODONE-ACETAMINOPHEN 5-325 MG PO TABS Oral Take 1-2 tablets by mouth every 6 (six) hours as needed for pain. 10 tablet 0  . NITROGLYCERIN 0.4 MG SL SUBL Sublingual Place 0.4 mg under the tongue every 5 (five) minutes as needed.      Marland Kitchen ONDANSETRON HCL 4 MG PO TABS Oral Take 1 tablet (4 mg total) by mouth every 6 (six) hours. 12 tablet 0    Triage Vitals: BP 131/79  Pulse 106  Temp(Src) 98.1 F (36.7 C) (Oral)  Resp 18  Ht 5\' 4"  (1.626 m)  Wt 82 lb (37.195 kg)  BMI 14.08 kg/m2  SpO2 97%  Physical Exam  Nursing note and vitals reviewed. Constitutional: She is oriented to person, place, and time.  HENT:  Head: Normocephalic and atraumatic.       Mucus membranes moist    Eyes: Conjunctivae and EOM are normal.  Neck: Neck supple.  Cardiovascular: Normal rate, regular rhythm and normal heart sounds.   Pulmonary/Chest: Effort normal and breath sounds normal.    Abdominal: Soft. Bowel sounds are normal. There is tenderness. There is no guarding.  Musculoskeletal: She exhibits no edema.  Neurological: She is alert and oriented to person, place, and time. No cranial nerve deficit.  Skin: Rash (does not branch and is 2-3 mm in size on both legs and on right arrms with some redness) noted.  Psychiatric: She has a normal mood and affect. Her  behavior is normal.    ED Course  Procedures (including critical care time)  DIAGNOSTIC STUDIES: Oxygen Saturation is 97% on room air, adequate by my interpretation.    COORDINATION OF CARE: 12:20PM discussed running test to check for abnormalities with pt and pt agreed.  Results for orders placed during the hospital encounter of 02/12/12  LIPASE, BLOOD      Component Value Range   Lipase 20  11 - 59 (U/L)  COMPREHENSIVE METABOLIC PANEL      Component Value Range   Sodium 140  135 - 145 (mEq/L)   Potassium 3.5  3.5 - 5.1 (mEq/L)   Chloride 102  96 - 112 (mEq/L)   CO2 28  19 - 32 (mEq/L)   Glucose, Bld 88  70 - 99 (mg/dL)   BUN 6  6 - 23 (mg/dL)   Creatinine, Ser 1.61 (*) 0.50 - 1.10 (mg/dL)   Calcium 9.5  8.4 - 09.6 (mg/dL)   Total Protein 6.9  6.0 - 8.3 (g/dL)   Albumin 3.8  3.5 - 5.2 (g/dL)   AST 14  0 - 37 (U/L)   ALT 9  0 - 35 (U/L)   Alkaline Phosphatase 82  39 - 117 (U/L)   Total Bilirubin 0.2 (*) 0.3 - 1.2 (mg/dL)   GFR calc non Af Amer >90  >90 (mL/min)   GFR calc Af Amer >90  >90 (mL/min)  URINALYSIS, ROUTINE W REFLEX MICROSCOPIC      Component Value Range   Color, Urine YELLOW  YELLOW    APPearance CLEAR  CLEAR    Specific Gravity, Urine 1.010  1.005 - 1.030    pH 6.5  5.0 - 8.0    Glucose, UA NEGATIVE  NEGATIVE (mg/dL)   Hgb urine dipstick MODERATE (*) NEGATIVE    Bilirubin Urine NEGATIVE  NEGATIVE    Ketones, ur NEGATIVE  NEGATIVE (mg/dL)   Protein, ur NEGATIVE  NEGATIVE (mg/dL)   Urobilinogen, UA 0.2  0.0 - 1.0 (mg/dL)   Nitrite NEGATIVE  NEGATIVE    Leukocytes, UA NEGATIVE   NEGATIVE   CBC      Component Value Range   WBC 8.5  4.0 - 10.5 (K/uL)   RBC 4.34  3.87 - 5.11 (MIL/uL)   Hemoglobin 13.7  12.0 - 15.0 (g/dL)   HCT 04.5  40.9 - 81.1 (%)   MCV 90.1  78.0 - 100.0 (fL)   MCH 31.6  26.0 - 34.0 (pg)   MCHC 35.0  30.0 - 36.0 (g/dL)   RDW 91.4  78.2 - 95.6 (%)   Platelets 326  150 - 400 (K/uL)  DIFFERENTIAL      Component Value Range   Neutrophils Relative 71  43 - 77 (%)   Neutro Abs 6.0  1.7 - 7.7 (K/uL)   Lymphocytes Relative 23  12 - 46 (%)   Lymphs Abs 1.9  0.7 - 4.0 (K/uL)   Monocytes Relative 6  3 - 12 (%)   Monocytes Absolute 0.5  0.1 - 1.0 (K/uL)   Eosinophils Relative 0  0 - 5 (%)   Eosinophils Absolute 0.0  0.0 - 0.7 (K/uL)   Basophils Relative 0  0 - 1 (%)   Basophils Absolute 0.0  0.0 - 0.1 (K/uL)  URINE MICROSCOPIC-ADD ON      Component Value Range   Squamous Epithelial / LPF FEW (*) RARE    WBC, UA 0-2  <3 (WBC/hpf)   RBC / HPF 3-6  <3 (RBC/hpf)  Labs Reviewed  COMPREHENSIVE METABOLIC PANEL - Abnormal; Notable for the following:    Creatinine, Ser 0.49 (*)    Total Bilirubin 0.2 (*)    All other components within normal limits  URINALYSIS, ROUTINE W REFLEX MICROSCOPIC - Abnormal; Notable for the following:    Hgb urine dipstick MODERATE (*)    All other components within normal limits  URINE MICROSCOPIC-ADD ON - Abnormal; Notable for the following:    Squamous Epithelial / LPF FEW (*)    All other components within normal limits  LIPASE, BLOOD  CBC  DIFFERENTIAL   Dg Chest 2 View  02/12/2012  *RADIOLOGY REPORT*  Clinical Data: 66 year old female with headache/flank pain.  CHEST - 2 VIEW  Comparison: 08/31/2011 and earlier.  Findings: Chronic large lung volumes.  Cardiac size and mediastinal contours are within normal limits.  Visualized tracheal air column is within normal limits.  Stable biapical scarring, greater on the left.  No pulmonary edema, pleural effusion or consolidation. Incidental left nipple shadow.  No acute  pulmonary opacity. No acute osseous abnormality identified.  IMPRESSION: Stable chronic lung disease.  No acute cardiopulmonary abnormality.  Original Report Authenticated By: Harley Hallmark, M.D.   Ct Head Wo Contrast  02/12/2012  *RADIOLOGY REPORT*  Clinical Data: Altered level of consciousness.  CT HEAD WITHOUT CONTRAST  Technique:  Contiguous axial images were obtained from the base of the skull through the vertex without contrast.  Comparison: Head CT 09/02/2011 and brain MRI 03/10/2011.  Findings: There is no evidence of acute intracranial abnormality including infarction, hemorrhage, mass lesion, mass effect, midline shift or abnormal extra-axial fluid collection.  Imaged paranasal sinuses are clear.  There is small amount of mastoid fluid bilaterally.  IMPRESSION: No acute finding.  Original Report Authenticated By: Bernadene Bell. Maricela Curet, M.D.   Ct Abdomen Pelvis W Contrast  02/12/2012  *RADIOLOGY REPORT*  Clinical Data: Left-sided abdominal pain.  History of cervical cancer and hysterectomy.  The patient was premedicated for a reported contrast allergy.  She tolerated the CT scan well, per the CT technologist.  CT ABDOMEN AND PELVIS WITH CONTRAST  Technique:  Multidetector CT imaging of the abdomen and pelvis was performed following the standard protocol during bolus administration of intravenous contrast.  Contrast: OMNIPAQUE IOHEXOL 300 MG/ML  SOLN  Comparison: CT chest abdomen pelvis 06/06/2010  Findings: Emphysematous changes are seen at the lung bases bilaterally.  Imaged portion the heart is within normal limits for size.  There is mild fatty infiltration of the liver.  Focal fatty deposition near the falciform ligament is stable.  No suspicious hepatic lesion or biliary ductal dilatation.  The gallbladder, spleen, adrenal glands, and left kidney are within normal limits. Too small circumscribed hypodensities in the upper pole of the right kidney appear similar to the prior study of 2009.   These are too small to characterize but likely reflect cysts.  There is no hydronephrosis.  The ureters are normal in caliber bilaterally.  Focal calcifications in the uncinate process of the pancreas are stable and suggest chronic pancreatitis.  The pancreatic duct is within normal limits.  No evidence of pancreatic mass or acute inflammation.  The adrenal glands are normal.  Bowel loops are normal in caliber and wall thickness. The cecum is positioned in the right pelvis.  A discrete appendix is not identified.  No focal inflammatory changes are seen adjacent to the cecum.  The urinary bladder is moderately distended and demonstrates normal wall thickness.  Patient is status post  hysterectomy.  There is no adnexal mass.  Negative for lymphadenopathy, mesenteric inflammatory change, or ascites.  There is atherosclerotic calcification of the abdominal aorta and iliac vasculature, especially involving the left common iliac artery.  Both common iliac arteries are patent.  No acute or suspicious bony abnormality.  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  IMPRESSION:  1.  No acute findings in the abdomen or pelvis. 2.  Marked emphysematous changes at the lung bases. 3.  Stable fatty infiltration of the liver.  4.  Evidence of chronic pancreatitis (pancreatic calcifications). 5.  Aortoiliac atherosclerotic changes.  Original Report Authenticated By: Britta Mccreedy, M.D.     1. Abdominal pain       MDM  Workup in the emergency part without significant findings. No direct evidence of pancreatitis, lipase is not elevated CT scan shows no inflammation of the pancreas. However patients with chronic pancreatitis sometimes don't show much changes. Patient improved in the emergency apartment we'll send home with pain medicine and antinausea medicine rest of the workup was negative no leukocytosis, no evidence of urinary tract infection a little bit hematuria patient states she has at  all times. Patient will followup with her primary care Dr. Patient already has a fentanyl Duragesic patch for chronic pain provided by her primary care Dr.      I personally performed the services described in this documentation, which was scribed in my presence. The recorded information has been reviewed and considered.     Shelda Jakes, MD 02/12/12 [phone]

## 2012-02-12 NOTE — ED Notes (Signed)
Pt resting quietly, awaiting disposition. No requests at this time. NAD. Family at bedside.

## 2012-02-12 NOTE — Discharge Instructions (Signed)
Specific cause of the left upper quadrant abdominal pain is not clear. Will need to followup with your primary doctor in the next few days. Take pain medicine and antinausea medicine as directed return for any new or worse symptoms.

## 2012-02-12 NOTE — ED Notes (Signed)
Pt states headache, rash to left leg, and left flank pain. Nausea. NAD.

## 2012-02-17 ENCOUNTER — Encounter: Payer: Self-pay | Admitting: Cardiology

## 2012-02-17 ENCOUNTER — Ambulatory Visit (INDEPENDENT_AMBULATORY_CARE_PROVIDER_SITE_OTHER): Payer: PRIVATE HEALTH INSURANCE | Admitting: Cardiology

## 2012-02-17 VITALS — BP 110/80 | HR 90 | Ht 64.0 in | Wt 80.0 lb

## 2012-02-17 DIAGNOSIS — F17200 Nicotine dependence, unspecified, uncomplicated: Secondary | ICD-10-CM

## 2012-02-17 DIAGNOSIS — E782 Mixed hyperlipidemia: Secondary | ICD-10-CM

## 2012-02-17 DIAGNOSIS — Z720 Tobacco use: Secondary | ICD-10-CM

## 2012-02-17 DIAGNOSIS — R634 Abnormal weight loss: Secondary | ICD-10-CM

## 2012-02-17 DIAGNOSIS — N200 Calculus of kidney: Secondary | ICD-10-CM

## 2012-02-17 DIAGNOSIS — E785 Hyperlipidemia, unspecified: Secondary | ICD-10-CM

## 2012-02-17 DIAGNOSIS — R079 Chest pain, unspecified: Secondary | ICD-10-CM

## 2012-02-17 DIAGNOSIS — Z0189 Encounter for other specified special examinations: Secondary | ICD-10-CM

## 2012-02-17 MED ORDER — NITROGLYCERIN 0.4 MG SL SUBL: 0.4000 mg | SUBLINGUAL_TABLET | SUBLINGUAL | Status: DC | PRN

## 2012-02-17 NOTE — Patient Instructions (Signed)
Your physician recommends that you schedule a follow-up appointment in: 1 year  Your physician recommends that you return for lab work in: Lipids within the next couple of weeks

## 2012-02-17 NOTE — Assessment & Plan Note (Signed)
No lipid profile has been performed since 2012 at which time results were suboptimal-one will be performed.

## 2012-02-17 NOTE — Progress Notes (Deleted)
Name: [name]    DOB: [birth year]  Age: 66 y.o.  MR#: [id number]       PCP:  Alice Reichert, MD, MD      Insurance: @PAYORNAME @   CC:    Chief Complaint  Patient presents with  . No complaints    1 year follow up - Med list/TC    VS BP 110/80  Pulse 90  Ht 5\' 4"  (1.626 m)  Wt 80 lb (36.288 kg)  BMI 13.73 kg/m2  SpO2 92%  Weights Current Weight  02/17/12 80 lb (36.288 kg)  02/12/12 82 lb (37.195 kg)  01/18/12 82 lb (37.195 kg)    Blood Pressure  BP Readings from Last 3 Encounters:  02/17/12 110/80  02/12/12 131/79  01/18/12 156/102     Admit date:  (Not on file) Last encounter with RMR:  Visit date not found   Allergy Allergies  Allergen Reactions  . Iohexol      Desc: HIVES AND SWELLING WITH I.V.P DYE, NEEDS PRE-MEDS.   Marland Kitchen Ibuprofen Rash    Current Outpatient Prescriptions  Medication Sig Dispense Refill  . alendronate (FOSAMAX) 70 MG tablet Take 70 mg by mouth every 7 (seven) days. Take with a full glass of water on an empty stomach. Patient take on Tuesday      . Calcium Carbonate (CALCIUM 600 PO) Take 1 tablet by mouth daily.        Marland Kitchen CREON 24000 UNITS CPEP TAKE 2 CAPSULES WITH EACH MEALS AND 1 CAPSULE WITH SNACKS.  240 each  5  . docusate sodium (COLACE) 100 MG capsule Take 100 mg by mouth daily.       . DULoxetine (CYMBALTA) 60 MG capsule Take 60 mg by mouth daily.        . fentaNYL (DURAGESIC - DOSED MCG/HR) 100 MCG/HR Place 1 patch onto the skin every 3 (three) days. Patient put patch on Monday night will change tonight      . Fluticasone-Salmeterol (ADVAIR DISKUS) 100-50 MCG/DOSE AEPB Inhale 1 puff into the lungs every 12 (twelve) hours.        Marland Kitchen LORazepam (ATIVAN) 0.5 MG tablet Take 0.5 mg by mouth every 4 (four) hours as needed. Take every 4 to 6 hours as needed for nerves      . metoprolol (LOPRESSOR) 50 MG tablet Take 25 mg by mouth 2 (two) times daily.        . nitroGLYCERIN (NITROSTAT) 0.4 MG SL tablet Place 0.4 mg under the tongue every 5  (five) minutes as needed.        Marland Kitchen omeprazole (PRILOSEC) 20 MG capsule TAKE ONE CAPSULE DAILY.  30 capsule  11  . promethazine (PHENERGAN) 25 MG tablet Take 25 mg by mouth every 6 (six) hours as needed.      . sucralfate (CARAFATE) 1 G tablet Take 1 tablet (1 g total) by mouth 4 (four) times daily.  120 tablet  1    Discontinued Meds:    Medications Discontinued During This Encounter  Medication Reason  . ondansetron (ZOFRAN) 4 MG tablet Discontinued by provider  . HYDROcodone-acetaminophen (NORCO) 5-325 MG per tablet Discontinued by provider  . famotidine (PEPCID) 20 MG tablet Discontinued by provider  . acetaminophen (TYLENOL) 500 MG tablet Discontinued by provider    Patient Active Problem List  Diagnoses  . ANXIETY DEPRESSION  . Tobacco abuse  . ALLERGIC RHINITIS, SEASONAL  . COPD  . GERD  . Chronic pancreatitis  . BACK PAIN, CHRONIC  .  ANOREXIA  . WEIGHT LOSS  . ABDOMINAL PAIN  . CERVICAL CANCER, HX OF  . COLONIC POLYPS, ADENOMATOUS, HX OF  . Nephrolithiasis  . Chest pain  . Hyperlipidemia    LABS Admission on 02/12/2012, Discharged on 02/12/2012  Component Date Value  . Lipase 02/12/2012 20   . Sodium 02/12/2012 140   . Potassium 02/12/2012 3.5   . Chloride 02/12/2012 102   . CO2 02/12/2012 28   . Glucose, Bld 02/12/2012 88   . BUN 02/12/2012 6   . Creatinine, Ser 02/12/2012 0.49*  . Calcium 02/12/2012 9.5   . Total Protein 02/12/2012 6.9   . Albumin 02/12/2012 3.8   . AST 02/12/2012 14   . ALT 02/12/2012 9   . Alkaline Phosphatase 02/12/2012 82   . Total Bilirubin 02/12/2012 0.2*  . GFR calc non Af Amer 02/12/2012 >90   . GFR calc Af Amer 02/12/2012 >90   . Color, Urine 02/12/2012 YELLOW   . APPearance 02/12/2012 CLEAR   . Specific Gravity, Urine 02/12/2012 1.010   . pH 02/12/2012 6.5   . Glucose, UA 02/12/2012 NEGATIVE   . Hgb urine dipstick 02/12/2012 MODERATE*  . Bilirubin Urine 02/12/2012 NEGATIVE   . Ketones, ur 02/12/2012 NEGATIVE   .  Protein, ur 02/12/2012 NEGATIVE   . Urobilinogen, UA 02/12/2012 0.2   . Nitrite 02/12/2012 NEGATIVE   . Leukocytes, UA 02/12/2012 NEGATIVE   . WBC 02/12/2012 8.5   . RBC 02/12/2012 4.34   . Hemoglobin 02/12/2012 13.7   . HCT 02/12/2012 39.1   . MCV 02/12/2012 90.1   . [facility] 02/12/2012 31.6   . MCHC 02/12/2012 35.0   . RDW 02/12/2012 12.5   . Platelets 02/12/2012 326   . Neutrophils Relative 02/12/2012 71   . Neutro Abs 02/12/2012 6.0   . Lymphocytes Relative 02/12/2012 23   . Lymphs Abs 02/12/2012 1.9   . Monocytes Relative 02/12/2012 6   . Monocytes Absolute 02/12/2012 0.5   . Eosinophils Relative 02/12/2012 0   . Eosinophils Absolute 02/12/2012 0.0   . Basophils Relative 02/12/2012 0   . Basophils Absolute 02/12/2012 0.0   . Squamous Epithelial / LPF 02/12/2012 FEW*  . WBC, UA 02/12/2012 0-2   . RBC / HPF 02/12/2012 3-6   Admission on 01/19/2012, Discharged on 01/19/2012  Component Date Value  . Sodium 01/19/2012 141   . Potassium 01/19/2012 3.8   . Chloride 01/19/2012 99   . CO2 01/19/2012 31   . Glucose, Bld 01/19/2012 111*  . BUN 01/19/2012 7   . Creatinine, Ser 01/19/2012 0.46*  . Calcium 01/19/2012 10.9*  . GFR calc non Af Amer 01/19/2012 >90   . GFR calc Af Amer 01/19/2012 >90   Admission on 01/07/2012, Discharged on 01/07/2012  Component Date Value  . Sodium 01/07/2012 137   . Potassium 01/07/2012 3.5   . Chloride 01/07/2012 99   . CO2 01/07/2012 30   . Glucose, Bld 01/07/2012 94   . BUN 01/07/2012 12   . Creatinine, Ser 01/07/2012 0.47*  . Calcium 01/07/2012 8.6   . GFR calc non Af Amer 01/07/2012 >90   . GFR calc Af Amer 01/07/2012 >90   . Lipase 01/07/2012 38   . WBC 01/07/2012 13.4*  . RBC 01/07/2012 4.86   . Hemoglobin 01/07/2012 15.4*  . HCT 01/07/2012 45.2   . MCV 01/07/2012 93.0   . [facility] 01/07/2012 31.7   . MCHC 01/07/2012 34.1   . RDW 01/07/2012 12.7   . Platelets 01/07/2012  393   . Neutrophils Relative 01/07/2012 64   . Neutro Abs  01/07/2012 8.6*  . Lymphocytes Relative 01/07/2012 25   . Lymphs Abs 01/07/2012 3.3   . Monocytes Relative 01/07/2012 9   . Monocytes Absolute 01/07/2012 1.3*  . Eosinophils Relative 01/07/2012 1   . Eosinophils Absolute 01/07/2012 0.2   . Basophils Relative 01/07/2012 0   . Basophils Absolute 01/07/2012 0.0   . Color, Urine 01/07/2012 YELLOW   . APPearance 01/07/2012 CLEAR   . Specific Gravity, Urine 01/07/2012 1.005   . pH 01/07/2012 6.5   . Glucose, UA 01/07/2012 NEGATIVE   . Hgb urine dipstick 01/07/2012 SMALL*  . Bilirubin Urine 01/07/2012 NEGATIVE   . Ketones, ur 01/07/2012 NEGATIVE   . Protein, ur 01/07/2012 NEGATIVE   . Urobilinogen, UA 01/07/2012 0.2   . Nitrite 01/07/2012 NEGATIVE   . Leukocytes, UA 01/07/2012 NEGATIVE   . RBC / HPF 01/07/2012 7-10   . Bacteria, UA 01/07/2012 RARE      Results for this Opt Visit:     Results for orders placed during the hospital encounter of 02/12/12  LIPASE, BLOOD      Component Value Range   Lipase 20  11 - 59 (U/L)  COMPREHENSIVE METABOLIC PANEL      Component Value Range   Sodium 140  135 - 145 (mEq/L)   Potassium 3.5  3.5 - 5.1 (mEq/L)   Chloride 102  96 - 112 (mEq/L)   CO2 28  19 - 32 (mEq/L)   Glucose, Bld 88  70 - 99 (mg/dL)   BUN 6  6 - 23 (mg/dL)   Creatinine, Ser 1.61 (*) 0.50 - 1.10 (mg/dL)   Calcium 9.5  8.4 - 09.6 (mg/dL)   Total Protein 6.9  6.0 - 8.3 (g/dL)   Albumin 3.8  3.5 - 5.2 (g/dL)   AST 14  0 - 37 (U/L)   ALT 9  0 - 35 (U/L)   Alkaline Phosphatase 82  39 - 117 (U/L)   Total Bilirubin 0.2 (*) 0.3 - 1.2 (mg/dL)   GFR calc non Af Amer >90  >90 (mL/min)   GFR calc Af Amer >90  >90 (mL/min)  URINALYSIS, ROUTINE W REFLEX MICROSCOPIC      Component Value Range   Color, Urine YELLOW  YELLOW    APPearance CLEAR  CLEAR    Specific Gravity, Urine 1.010  1.005 - 1.030    pH 6.5  5.0 - 8.0    Glucose, UA NEGATIVE  NEGATIVE (mg/dL)   Hgb urine dipstick MODERATE (*) NEGATIVE    Bilirubin Urine NEGATIVE   NEGATIVE    Ketones, ur NEGATIVE  NEGATIVE (mg/dL)   Protein, ur NEGATIVE  NEGATIVE (mg/dL)   Urobilinogen, UA 0.2  0.0 - 1.0 (mg/dL)   Nitrite NEGATIVE  NEGATIVE    Leukocytes, UA NEGATIVE  NEGATIVE   CBC      Component Value Range   WBC 8.5  4.0 - 10.5 (K/uL)   RBC 4.34  3.87 - 5.11 (MIL/uL)   Hemoglobin 13.7  12.0 - 15.0 (g/dL)   HCT 04.5  40.9 - 81.1 (%)   MCV 90.1  78.0 - 100.0 (fL)   MCH 31.6  26.0 - 34.0 (pg)   MCHC 35.0  30.0 - 36.0 (g/dL)   RDW 91.4  78.2 - 95.6 (%)   Platelets 326  150 - 400 (K/uL)  DIFFERENTIAL      Component Value Range   Neutrophils Relative 71  43 -  77 (%)   Neutro Abs 6.0  1.7 - 7.7 (K/uL)   Lymphocytes Relative 23  12 - 46 (%)   Lymphs Abs 1.9  0.7 - 4.0 (K/uL)   Monocytes Relative 6  3 - 12 (%)   Monocytes Absolute 0.5  0.1 - 1.0 (K/uL)   Eosinophils Relative 0  0 - 5 (%)   Eosinophils Absolute 0.0  0.0 - 0.7 (K/uL)   Basophils Relative 0  0 - 1 (%)   Basophils Absolute 0.0  0.0 - 0.1 (K/uL)  URINE MICROSCOPIC-ADD ON      Component Value Range   Squamous Epithelial / LPF FEW (*) RARE    WBC, UA 0-2  <3 (WBC/hpf)   RBC / HPF 3-6  <3 (RBC/hpf)    EKG Orders placed during the hospital encounter of 08/31/11  . ED EKG  . ED EKG  . EKG     Prior Assessment and Plan Problem List as of 02/17/2012          Cardiology Problems   Hyperlipidemia     Other   ANXIETY DEPRESSION   Tobacco abuse   ALLERGIC RHINITIS, SEASONAL   COPD   GERD   Chronic pancreatitis   Last Assessment & Plan Note   01/07/2011 Office Visit Signed 01/07/2011 12:42 PM by Dyann Kief, PA    Patient has chronic pancreatitis which contributes to her weight loss and dehydration. This may all be related to her recent problems with dizziness. We will order labs to rule out any significant deficiencies.    BACK PAIN, CHRONIC   ANOREXIA   WEIGHT LOSS   ABDOMINAL PAIN   CERVICAL CANCER, HX OF   COLONIC POLYPS, ADENOMATOUS, HX OF   Nephrolithiasis   Chest pain    Last Assessment & Plan Note   02/13/2011 Office Visit Signed 02/13/2011 12:48 PM by Kathlen Brunswick, MD    Chest discomfort as though quite infrequent and tolerable.  Chronic fatigue and generalized weakness along with continuing weight loss and malnutrition appear to be her principal problems.  These are currently managed by her gastroenterologist and primary care physician.  We have made suggestions to her about increasing caloric intake and maintaining her weight.  No further cardiology assessment or treatment is required at present.  I will reassess this nice woman in one year.        Imaging: Dg Chest 2 View  02/12/2012  *RADIOLOGY REPORT*  Clinical Data: 66 year old female with headache/flank pain.  CHEST - 2 VIEW  Comparison: 08/31/2011 and earlier.  Findings: Chronic large lung volumes.  Cardiac size and mediastinal contours are within normal limits.  Visualized tracheal air column is within normal limits.  Stable biapical scarring, greater on the left.  No pulmonary edema, pleural effusion or consolidation. Incidental left nipple shadow.  No acute pulmonary opacity. No acute osseous abnormality identified.  IMPRESSION: Stable chronic lung disease.  No acute cardiopulmonary abnormality.  Original Report Authenticated By: Harley Hallmark, M.D.   Ct Head Wo Contrast  02/12/2012  *RADIOLOGY REPORT*  Clinical Data: Altered level of consciousness.  CT HEAD WITHOUT CONTRAST  Technique:  Contiguous axial images were obtained from the base of the skull through the vertex without contrast.  Comparison: Head CT 09/02/2011 and brain MRI 03/10/2011.  Findings: There is no evidence of acute intracranial abnormality including infarction, hemorrhage, mass lesion, mass effect, midline shift or abnormal extra-axial fluid collection.  Imaged paranasal sinuses are clear.  There is small amount of  mastoid fluid bilaterally.  IMPRESSION: No acute finding.  Original Report Authenticated By: Bernadene Bell. Maricela Curet, M.D.    Ct Abdomen Pelvis W Contrast  02/12/2012  *RADIOLOGY REPORT*  Clinical Data: Left-sided abdominal pain.  History of cervical cancer and hysterectomy.  The patient was premedicated for a reported contrast allergy.  She tolerated the CT scan well, per the CT technologist.  CT ABDOMEN AND PELVIS WITH CONTRAST  Technique:  Multidetector CT imaging of the abdomen and pelvis was performed following the standard protocol during bolus administration of intravenous contrast.  Contrast: OMNIPAQUE IOHEXOL 300 MG/ML  SOLN  Comparison: CT chest abdomen pelvis 06/06/2010  Findings: Emphysematous changes are seen at the lung bases bilaterally.  Imaged portion the heart is within normal limits for size.  There is mild fatty infiltration of the liver.  Focal fatty deposition near the falciform ligament is stable.  No suspicious hepatic lesion or biliary ductal dilatation.  The gallbladder, spleen, adrenal glands, and left kidney are within normal limits. Too small circumscribed hypodensities in the upper pole of the right kidney appear similar to the prior study of 2009.  These are too small to characterize but likely reflect cysts.  There is no hydronephrosis.  The ureters are normal in caliber bilaterally.  Focal calcifications in the uncinate process of the pancreas are stable and suggest chronic pancreatitis.  The pancreatic duct is within normal limits.  No evidence of pancreatic mass or acute inflammation.  The adrenal glands are normal.  Bowel loops are normal in caliber and wall thickness. The cecum is positioned in the right pelvis.  A discrete appendix is not identified.  No focal inflammatory changes are seen adjacent to the cecum.  The urinary bladder is moderately distended and demonstrates normal wall thickness.  Patient is status post hysterectomy.  There is no adnexal mass.  Negative for lymphadenopathy, mesenteric inflammatory change, or ascites.  There is atherosclerotic calcification of the abdominal  aorta and iliac vasculature, especially involving the left common iliac artery.  Both common iliac arteries are patent.  No acute or suspicious bony abnormality.  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  IMPRESSION:  1.  No acute findings in the abdomen or pelvis. 2.  Marked emphysematous changes at the lung bases. 3.  Stable fatty infiltration of the liver.  4.  Evidence of chronic pancreatitis (pancreatic calcifications). 5.  Aortoiliac atherosclerotic changes.  Original Report Authenticated By: Britta Mccreedy, M.D.     [facility] Calculation: Score not calculated. Missing: Total Cholesterol

## 2012-02-17 NOTE — Assessment & Plan Note (Addendum)
Patient understands the potential adverse consequences of continued tobacco use, but has no confidence that she is able to stop.  The [facility] is planning to institute smoking cessation classes.  I will request that she participate once these have been organized.

## 2012-02-17 NOTE — Progress Notes (Signed)
Patient ID: [name], female   DOB: January 16, 1946, 66 y.o.   MRN: [id number]  HPI: Scheduled return visit for this very nice woman followed for a variety of intermittent symptoms including chest discomfort and palpitations.  Since her last visit, she has done well from a cardiac standpoint with no hospitalizations, no emergency department visits and little in the way of cardiac symptoms.  She uses nitroglycerin once or twice per week, but is uncertain whether the discomfort she experiences at that time emanates from her chest or her abdomen.  Unfortunately, she has had a host of GI issues with nausea, emesis, anorexia and additional weight loss.  She was recently hospitalized for same at which time ENT evaluation was also advised, but has not yet been arranged.  Unfortunately, she continues to smoke cigarettes at the rate of one pack per day.  She has never stopped tobacco use for any significant period of time, but has been treated with multiple medications to assist her in doing so.  Prior to Admission medications   Medication Sig Start Date End Date Taking? Authorizing Provider  alendronate (FOSAMAX) 70 MG tablet Take 70 mg by mouth every 7 (seven) days. Take with a full glass of water on an empty stomach. Patient take on Tuesday   Yes Historical Provider, MD  Calcium Carbonate (CALCIUM 600 PO) Take 1 tablet by mouth daily.     Yes Historical Provider, MD  CREON 24000 UNITS CPEP TAKE 2 CAPSULES WITH EACH MEALS AND 1 CAPSULE WITH SNACKS. 02/06/12  Yes Nira Retort, NP  docusate sodium (COLACE) 100 MG capsule Take 100 mg by mouth daily.    Yes Historical Provider, MD  DULoxetine (CYMBALTA) 60 MG capsule Take 60 mg by mouth daily.     Yes Historical Provider, MD  fentaNYL (DURAGESIC - DOSED MCG/HR) 100 MCG/HR Place 1 patch onto the skin every 3 (three) days. Patient put patch on Monday night will change tonight   Yes Historical Provider, MD  Fluticasone-Salmeterol (ADVAIR DISKUS) 100-50 MCG/DOSE AEPB  Inhale 1 puff into the lungs every 12 (twelve) hours.     Yes Historical Provider, MD  LORazepam (ATIVAN) 0.5 MG tablet Take 0.5 mg by mouth every 4 (four) hours as needed. Take every 4 to 6 hours as needed for nerves   Yes Historical Provider, MD  metoprolol (LOPRESSOR) 50 MG tablet Take 25 mg by mouth 2 (two) times daily.     Yes Historical Provider, MD  nitroGLYCERIN (NITROSTAT) 0.4 MG SL tablet Place 1 tablet (0.4 mg total) under the tongue every 5 (five) minutes as needed. 02/17/12  Yes Kathlen Brunswick, MD  omeprazole (PRILOSEC) 20 MG capsule TAKE ONE CAPSULE DAILY. 05/09/11  Yes Tiffany Kocher, PA  promethazine (PHENERGAN) 25 MG tablet Take 25 mg by mouth every 6 (six) hours as needed.   Yes Historical Provider, MD  sucralfate (CARAFATE) 1 G tablet Take 1 tablet (1 g total) by mouth 4 (four) times daily. 01/19/12 01/18/13 Yes Benny Lennert, MD   Allergies  Allergen Reactions  . Iohexol      Desc: HIVES AND SWELLING WITH I.V.P DYE, NEEDS PRE-MEDS.   Marland Kitchen Ibuprofen Rash     Past medical history, social history, and family history reviewed and updated.  ROS: Denies chest discomfort, orthopnea, PND, palpitations, lightheadedness or syncope.  Also denies chronic cough, sputum production or wheezing.  She does have chronic class III dyspnea on exertion.  All other systems reviewed and are negative.  PHYSICAL  EXAM: BP 110/80  Pulse 90  Ht 5\' 4"  (1.626 m)  Wt 36.288 kg (80 lb)  BMI 13.73 kg/m2  SpO2 92% ; weight loss of 2 pounds since her last visit General-Well developed; no acute distress Body habitus-Extremely thin Neck-No JVD; no carotid bruits; long hair that appears quite healthy; hoarse voice; right esotropia Lungs-clear lung fields; resonant to percussion Cardiovascular-normal PMI; normal S1 and S2 Abdomen-normal bowel sounds; soft and non-tender without masses or organomegaly Musculoskeletal-No deformities, no cyanosis or clubbing Neurologic-Normal cranial nerves; symmetric  strength and tone Skin-Warm, no significant lesions Extremities-distal pulses intact; no edema  ASSESSMENT AND PLAN:   Bing, MD 02/17/2012 11:41 AM

## 2012-02-17 NOTE — Assessment & Plan Note (Signed)
Chest discomfort is infrequent, probably not cardiac in origin and well-controlled with benign medication.

## 2012-02-19 NOTE — Telephone Encounter (Signed)
Reminder in epic to follow up in Winter 2013 (November will be 6 months from now)

## 2012-03-03 ENCOUNTER — Encounter: Payer: Self-pay | Admitting: *Deleted

## 2012-03-14 ENCOUNTER — Other Ambulatory Visit: Payer: Self-pay | Admitting: Cardiology

## 2012-03-15 LAB — LIPID PANEL: Total CHOL/HDL Ratio: 3.5 Ratio

## 2012-05-02 ENCOUNTER — Other Ambulatory Visit: Payer: Self-pay

## 2012-05-02 ENCOUNTER — Encounter: Payer: Self-pay | Admitting: Internal Medicine

## 2012-05-02 NOTE — Telephone Encounter (Signed)
Pt needs Fu OV prior to further refills. Please arrange.

## 2012-05-02 NOTE — Telephone Encounter (Signed)
Pt has a appointment at 2:00 with AS

## 2012-05-03 ENCOUNTER — Encounter: Payer: Self-pay | Admitting: Gastroenterology

## 2012-05-03 ENCOUNTER — Ambulatory Visit (INDEPENDENT_AMBULATORY_CARE_PROVIDER_SITE_OTHER): Payer: PRIVATE HEALTH INSURANCE | Admitting: Gastroenterology

## 2012-05-03 VITALS — BP 102/66 | HR 78 | Temp 97.0°F | Ht 62.0 in | Wt 79.4 lb

## 2012-05-03 DIAGNOSIS — R634 Abnormal weight loss: Secondary | ICD-10-CM

## 2012-05-03 DIAGNOSIS — R110 Nausea: Secondary | ICD-10-CM

## 2012-05-03 DIAGNOSIS — Z860100 Personal history of colon polyps, unspecified: Secondary | ICD-10-CM

## 2012-05-03 DIAGNOSIS — K5900 Constipation, unspecified: Secondary | ICD-10-CM

## 2012-05-03 DIAGNOSIS — K861 Other chronic pancreatitis: Secondary | ICD-10-CM

## 2012-05-03 MED ORDER — OMEPRAZOLE 20 MG PO CPDR: 20.0000 mg | DELAYED_RELEASE_CAPSULE | Freq: Every day | ORAL | Status: DC

## 2012-05-03 MED ORDER — LUBIPROSTONE 8 MCG PO CAPS: 8.0000 ug | ORAL_CAPSULE | Freq: Two times a day (BID) | ORAL | Status: AC

## 2012-05-03 NOTE — Progress Notes (Signed)
Referring Provider: Alice Reichert, MD Primary Care Physician:  Alice Reichert, MD Primary Gastroenterologist: Dr. Jena Gauss   Chief Complaint  Patient presents with  . Abdominal Pain  . Nausea    HPI:   66 year old female with hx of chronic pancreatitis, wt loss with negative CT scan to r/o occult malignancy, GERD, colonic adenomas with need for surveillance colonoscopy in 2015. Negative for celiac and adrenal insufficiency. Needs refill on Omeprazole. Nauseated all the time. Constant. +loss of appetite. No dysphagia.  Bowels won't move. Has to take laxatives to make move. When they do, it's like peanut butter. Pasty. Feels like constipation is worsening. No blood in stool. +abdominal pain, umbilicus. Intermittent. Sometimes better after BM. Wt is up 1 lb from Dec 2011, last visit.    Past Medical History  Diagnosis Date  . CAD (coronary artery disease)     palpitations, dizziness, chest pain  . Hx of colonic polyps     adenomatous  . Tobacco abuse   . Back pain, chronic   . Cervical cancer   . Allergic rhinitis   . Anxiety and depression   . Nephrolithiasis   . Pancreatitis chronic   . Hematochezia   . GERD (gastroesophageal reflux disease)   . COPD (chronic obstructive pulmonary disease)   . Weight loss   . Renal calculus   . Hyperlipidemia     Lipid profile on 02/25/2011: 209, 113, 55, 132    Past Surgical History  Procedure Date  . Partial hysterectomy   . Appendectomy   . Colonoscopy w/ polypectomy 2006    hemorrhoids, benign gastric nodule, multiple adenomatous polyps, one with tubular morphology  . Ileocolonoscopy 01/11/2009    Polyp at the splenic flexure, status post hot snare removal/ Normal rectum, tubular adenoma  . Esophagogastroduodenoscopy 2006    benign gastric nodule    Current Outpatient Prescriptions  Medication Sig Dispense Refill  . alendronate (FOSAMAX) 70 MG tablet Take 70 mg by mouth every 7 (seven) days. Take with a full glass of water on an  empty stomach. Patient take on Tuesday      . Calcium Carbonate (CALCIUM 600 PO) Take 1 tablet by mouth daily.        Marland Kitchen CREON 24000 UNITS CPEP TAKE 2 CAPSULES WITH EACH MEALS AND 1 CAPSULE WITH SNACKS.  240 each  5  . docusate sodium (COLACE) 100 MG capsule Take 100 mg by mouth daily.       . DULoxetine (CYMBALTA) 60 MG capsule Take 60 mg by mouth daily.        . fentaNYL (DURAGESIC - DOSED MCG/HR) 100 MCG/HR Place 1 patch onto the skin every 3 (three) days. Patient put patch on Monday night will change tonight      . Fluticasone-Salmeterol (ADVAIR DISKUS) 100-50 MCG/DOSE AEPB Inhale 1 puff into the lungs every 12 (twelve) hours.        Marland Kitchen LORazepam (ATIVAN) 0.5 MG tablet Take 0.5 mg by mouth every 4 (four) hours as needed. Take every 4 to 6 hours as needed for nerves      . metoprolol (LOPRESSOR) 50 MG tablet Take 25 mg by mouth 2 (two) times daily.        . nitroGLYCERIN (NITROSTAT) 0.4 MG SL tablet Place 1 tablet (0.4 mg total) under the tongue every 5 (five) minutes as needed.  100 tablet  3  . omeprazole (PRILOSEC) 20 MG capsule Take 1 capsule (20 mg total) by mouth daily.  30 capsule  11  .  promethazine (PHENERGAN) 25 MG tablet Take 25 mg by mouth every 6 (six) hours as needed.      . sucralfate (CARAFATE) 1 G tablet Take 1 tablet (1 g total) by mouth 4 (four) times daily.  120 tablet  1  . lubiprostone (AMITIZA) 8 MCG capsule Take 1 capsule (8 mcg total) by mouth 2 (two) times daily with a meal.  30 capsule  1    Allergies as of 05/03/2012 - Review Complete 05/03/2012  Allergen Reaction Noted  . Iohexol  05/23/2004  . Ibuprofen Rash 12/19/2008    Family History  Problem Relation Age of Onset  . Heart attack Father 50  . Colon cancer Maternal Grandmother     History   Social History  . Marital Status: Divorced    Spouse Name: N/A    Number of Children: 3  . Years of Education: N/A   Occupational History  . disabled    Social History Main Topics  . Smoking status: Current  Everyday Smoker -- 1.0 packs/day    Types: Cigarettes  . Smokeless tobacco: Never Used  . Alcohol Use: No     hx of ETOH about 20 years ago.   . Drug Use: No  . Sexually Active: None   Other Topics Concern  . None   Social History Narrative  . None    Review of Systems: Gen: SEE HPI  CV: Denies chest pain, palpitations, syncope, peripheral edema, and claudication. Resp: Denies dyspnea at rest, cough, wheezing, coughing up blood, and pleurisy. GI: SEE HPI Derm: Denies rash, itching, dry skin Psych: Denies depression, anxiety, memory loss, confusion. No homicidal or suicidal ideation.  Heme: Denies bruising, bleeding, and enlarged lymph nodes.  Physical Exam: BP 102/66  Pulse 78  Temp 97 F (36.1 C) (Temporal)  Ht 5\' 2"  (1.575 m)  Wt 79 lb 6.4 oz (36.016 kg)  BMI 14.52 kg/m2 General:   Alert and oriented. Cachectic appearing  Head:  Normocephalic and atraumatic. Eyes:  Conjuctiva clear without scleral icterus. Mouth:  Oral mucosa pink and moist. Good dentition. No lesions. Neck:  Supple, without mass or thyromegaly. Heart:  S1, S2 present without murmurs, rubs, or gallops. Regular rate and rhythm. Abdomen:  +BS, soft, thin, non-tender and non-distended. No rebound or guarding. No HSM or masses noted. Msk:  Symmetrical without gross deformities. Normal posture. Extremities:  Without edema. Neurologic:  Alert and  oriented x4;  grossly normal neurologically. Skin:  Intact without significant lesions or rashes. Cervical Nodes:  No significant cervical adenopathy. Psych:  Alert and cooperative. Normal mood and affect.

## 2012-05-03 NOTE — Patient Instructions (Addendum)
Continue to take Prilosec 20 mg daily, 30 minutes before the first meal of the day. Prescription has been sent to the pharmacy.  Start taking Amitiza , twice a day WITH FOOD TO AVOID NAUSEA. This is for constipation. I have sent a prescription to your pharmacy as well.   We have ordered a test to check how well your stomach empties.  We will see you back in 6 weeks.

## 2012-05-05 ENCOUNTER — Encounter (HOSPITAL_COMMUNITY): Payer: PRIVATE HEALTH INSURANCE

## 2012-05-07 ENCOUNTER — Encounter: Payer: Self-pay | Admitting: Gastroenterology

## 2012-05-07 DIAGNOSIS — K5900 Constipation, unspecified: Secondary | ICD-10-CM | POA: Insufficient documentation

## 2012-05-07 DIAGNOSIS — R112 Nausea with vomiting, unspecified: Secondary | ICD-10-CM | POA: Insufficient documentation

## 2012-05-07 NOTE — Assessment & Plan Note (Signed)
Continue enzymes.

## 2012-05-07 NOTE — Assessment & Plan Note (Signed)
In the setting of chronic pancreatitis. No prior GES on file. Last EGD 2006 benign. No indication for EGD at this time; question underlying gastroparesis. GES, continue PPI, return in 6 weeks.

## 2012-05-07 NOTE — Assessment & Plan Note (Signed)
Stable from dec 2011.

## 2012-05-07 NOTE — Assessment & Plan Note (Signed)
No rectal bleeding. TCS up-to-date. Amitiza trial. Prescription and samples provided. 6 weeks follow up.

## 2012-05-07 NOTE — Assessment & Plan Note (Signed)
Surveillance 2015.  

## 2012-05-09 NOTE — Progress Notes (Signed)
Faxed to PCP

## 2012-05-28 ENCOUNTER — Emergency Department (HOSPITAL_COMMUNITY): Payer: PRIVATE HEALTH INSURANCE

## 2012-05-28 ENCOUNTER — Encounter (HOSPITAL_COMMUNITY): Payer: Self-pay | Admitting: *Deleted

## 2012-05-28 ENCOUNTER — Emergency Department (HOSPITAL_COMMUNITY)
Admission: EM | Admit: 2012-05-28 | Discharge: 2012-05-28 | Disposition: A | Discharge status: Home / Self Care | Payer: PRIVATE HEALTH INSURANCE | Attending: Emergency Medicine | Admitting: Emergency Medicine

## 2012-05-28 DIAGNOSIS — R1011 Right upper quadrant pain: Secondary | ICD-10-CM | POA: Insufficient documentation

## 2012-05-28 DIAGNOSIS — K089 Disorder of teeth and supporting structures, unspecified: Secondary | ICD-10-CM | POA: Insufficient documentation

## 2012-05-28 DIAGNOSIS — R234 Changes in skin texture: Secondary | ICD-10-CM | POA: Insufficient documentation

## 2012-05-28 DIAGNOSIS — K861 Other chronic pancreatitis: Secondary | ICD-10-CM | POA: Insufficient documentation

## 2012-05-28 DIAGNOSIS — R6884 Jaw pain: Secondary | ICD-10-CM | POA: Insufficient documentation

## 2012-05-28 DIAGNOSIS — R509 Fever, unspecified: Secondary | ICD-10-CM | POA: Insufficient documentation

## 2012-05-28 LAB — CBC WITH DIFFERENTIAL/PLATELET
Eosinophils Relative: 1 % (ref 0–5)
Lymphocytes Relative: 29 % (ref 12–46)
Lymphs Abs: 2.8 10*3/uL (ref 0.7–4.0)
MCV: 89.6 fL (ref 78.0–100.0)
Neutro Abs: 5.8 10*3/uL (ref 1.7–7.7)
Platelets: 289 10*3/uL (ref 150–400)
RBC: 4.7 MIL/uL (ref 3.87–5.11)
WBC: 9.7 10*3/uL (ref 4.0–10.5)

## 2012-05-28 LAB — BASIC METABOLIC PANEL
CO2: 32 mEq/L (ref 19–32)
Chloride: 96 mEq/L (ref 96–112)
Glucose, Bld: 86 mg/dL (ref 70–99)
Potassium: 3.6 mEq/L (ref 3.5–5.1)
Sodium: 134 mEq/L — ABNORMAL LOW (ref 135–145)

## 2012-05-28 LAB — HEPATIC FUNCTION PANEL
Albumin: 3.5 g/dL (ref 3.5–5.2)
Total Protein: 6.5 g/dL (ref 6.0–8.3)

## 2012-05-28 LAB — URINE MICROSCOPIC-ADD ON

## 2012-05-28 LAB — URINALYSIS, ROUTINE W REFLEX MICROSCOPIC
Glucose, UA: NEGATIVE mg/dL
Leukocytes, UA: NEGATIVE
Specific Gravity, Urine: <1.005 — ABNORMAL LOW (ref 1.005–1.030)
pH: 6.5 (ref 5.0–8.0)

## 2012-05-28 LAB — LIPASE, BLOOD: Lipase: 31 U/L (ref 11–59)

## 2012-05-28 MED ORDER — SODIUM CHLORIDE 0.9 % IV SOLN
Freq: Once | INTRAVENOUS | Status: AC
  Administered 2012-05-28: 16:00:00 via INTRAVENOUS

## 2012-05-28 MED ORDER — OXYCODONE-ACETAMINOPHEN 5-325 MG PO TABS: 1.0000 | ORAL_TABLET | ORAL | Status: AC | PRN

## 2012-05-28 MED ORDER — DIPHENHYDRAMINE HCL 50 MG/ML IJ SOLN
50.0000 mg | Freq: Once | INTRAMUSCULAR | Status: AC
  Administered 2012-05-28: 50 mg via INTRAVENOUS
  Filled 2012-05-28: qty 1

## 2012-05-28 MED ORDER — METHYLPREDNISOLONE SODIUM SUCC 125 MG IJ SOLR
125.0000 mg | Freq: Once | INTRAMUSCULAR | Status: AC
  Administered 2012-05-28: 125 mg via INTRAVENOUS
  Filled 2012-05-28: qty 2

## 2012-05-28 MED ORDER — IOHEXOL 300 MG/ML  SOLN: 75.0000 mL | Freq: Once | INTRAMUSCULAR | Status: DC | PRN

## 2012-05-28 NOTE — ED Provider Notes (Cosign Needed)
History   This chart was scribed for Candace Human, MD by Toya Smothers. The patient was seen in room APA06/APA06. Patient's care was started at 1300.  CSN: [id number]  Arrival date & time 05/28/12  1300   First MD Initiated Contact with Patient 05/28/12 1504      Chief Complaint  Patient presents with  . Abdominal Pain  . Dental Pain   Patient is a 66 y.o. female presenting with abdominal pain and tooth pain. The history is provided by the patient. No language interpreter was used.  Abdominal Pain The primary symptoms of the illness include abdominal pain, fever and nausea. The primary symptoms of the illness do not include shortness of breath or vomiting.  Additional symptoms associated with the illness include chills. Symptoms associated with the illness do not include back pain.  Dental PainThe primary symptoms include fever. Primary symptoms do not include headaches, shortness of breath or cough.  Additional symptoms include: ear pain.    [name] is a 66 y.o. female with h/o pancreatitis (no recent hospitilization) who presents to the Emergency Department complaining of dental pain and abdominal pain onset 10 hours ago. Pt reports that she had an skin infection near her jaw inside of her mouth 2 weeks ago. After completing medication Pt now feels worse; increased pain and more constant. Pt also denotes gradual onset increased abdominal pain starting 10 hours ago, Fever (101), and chills, ear ache, nausea, and rash left side. Denies cough, chest pain, emesis, diarrhea, dysuria, frequency, seizure, convulsion, and syncope. Pt is a pack a day smoker.  Pt lists PCP as Dr. Renard Matter   Past Medical History  Diagnosis Date  . CAD (coronary artery disease)     palpitations, dizziness, chest pain  . Hx of colonic polyps     adenomatous  . Tobacco abuse   . Back pain, chronic   . Cervical cancer   . Allergic rhinitis   . Anxiety and depression   . Nephrolithiasis   .  Pancreatitis chronic   . Hematochezia   . GERD (gastroesophageal reflux disease)   . COPD (chronic obstructive pulmonary disease)   . Weight loss     CT negative for occult malignancy, negative celiac, negative adrenal insufficiency  . Renal calculus   . Hyperlipidemia     Lipid profile on 02/25/2011: 209, 113, 55, 132    Past Surgical History  Procedure Date  . Partial hysterectomy   . Appendectomy   . Colonoscopy w/ polypectomy 2006    hemorrhoids, benign gastric nodule, multiple adenomatous polyps, one with tubular morphology  . Ileocolonoscopy 01/11/2009    Polyp at the splenic flexure, status post hot snare removal/ Normal rectum, tubular adenoma  . Esophagogastroduodenoscopy 2006    benign gastric nodule    Family History  Problem Relation Age of Onset  . Heart attack Father 41  . Colon cancer Maternal Grandmother     History  Substance Use Topics  . Smoking status: Current Everyday Smoker -- 1.0 packs/day    Types: Cigarettes  . Smokeless tobacco: Never Used  . Alcohol Use: No     hx of ETOH about 20 years ago.    Review of Systems  Constitutional: Positive for fever and chills.       10 Systems reviewed and are negative for acute change except as noted in the HPI.  HENT: Positive for ear pain and dental problem. Negative for congestion.   Eyes: Negative for discharge and redness.  Respiratory: Negative for cough and shortness of breath.   Cardiovascular: Negative for chest pain.  Gastrointestinal: Positive for nausea and abdominal pain. Negative for vomiting.  Musculoskeletal: Negative for back pain.  Skin: Negative for rash.  Neurological: Negative for syncope, numbness and headaches.  Psychiatric/Behavioral:       No behavior change.  All other systems reviewed and are negative.    Allergies  Ibuprofen and Iohexol  Home Medications   Current Outpatient Rx  Name Route Sig Dispense Refill  . ALENDRONATE SODIUM 70 MG PO TABS Oral Take 70 mg by mouth  every 7 (seven) days. Take with a full glass of water on an empty stomach. Patient take on monday    . CALCIUM 600 PO Oral Take 1 tablet by mouth daily.      Marland Kitchen CREON 24000 UNITS PO CPEP  TAKE 2 CAPSULES WITH EACH MEALS AND 1 CAPSULE WITH SNACKS. 240 each 5  . DOCUSATE SODIUM 100 MG PO CAPS Oral Take 100 mg by mouth daily.     . DULOXETINE HCL 60 MG PO CPEP Oral Take 60 mg by mouth daily.      . FENTANYL 75 MCG/HR TD PT72 Transdermal Place 1 patch onto the skin every 3 (three) days.    Marland Kitchen FLUTICASONE-SALMETEROL 100-50 MCG/DOSE IN AEPB Inhalation Inhale 1 puff into the lungs every 12 (twelve) hours.      Marland Kitchen LORAZEPAM 0.5 MG PO TABS Oral Take 0.5 mg by mouth every 4 (four) hours as needed. Take every 4 to 6 hours as needed for nerves    . LUBIPROSTONE 8 MCG PO CAPS Oral Take 1 capsule (8 mcg total) by mouth 2 (two) times daily with a meal. 30 capsule 1  . METOPROLOL TARTRATE 50 MG PO TABS Oral Take 25 mg by mouth 2 (two) times daily.      Marland Kitchen PROMETHAZINE HCL 25 MG PO TABS Oral Take 25 mg by mouth every 6 (six) hours as needed.    Marland Kitchen NITROGLYCERIN 0.4 MG SL SUBL Sublingual Place 1 tablet (0.4 mg total) under the tongue every 5 (five) minutes as needed. 100 tablet 3    BP 176/87  Pulse 80  Temp 97.7 F (36.5 C) (Oral)  Ht 5\' 4"  (1.626 m)  Wt 79 lb (35.834 kg)  BMI 13.56 kg/m2  SpO2 98%  Physical Exam  Nursing note and vitals reviewed. Constitutional: She is oriented to person, place, and time. No distress.  HENT:  Head: Normocephalic and atraumatic.  Mouth/Throat: No oropharyngeal exudate.       No teeth on the lower jaw. papulas on the mucosa of the R cheekc  Eyes: EOM are normal. Pupils are equal, round, and reactive to light. Right eye exhibits no discharge. Left eye exhibits no discharge.  Neck: Normal range of motion. Neck supple. No tracheal deviation present. No thyromegaly present.  Cardiovascular: Normal rate and regular rhythm.   Pulmonary/Chest: Effort normal and breath sounds  normal. No respiratory distress. She has no wheezes. She has no rales.  Abdominal: Soft. Bowel sounds are normal. There is no tenderness. There is no guarding.       RUQ tenderness  Musculoskeletal: Normal range of motion.  Lymphadenopathy:    She has no cervical adenopathy.  Neurological: She is alert and oriented to person, place, and time. No cranial nerve deficit. Coordination normal.  Skin: Skin is warm. No erythema.       Small scabs on the left lower leg.    ED Course  Procedures (including critical care time) DIAGNOSTIC STUDIES: Oxygen Saturation is 98% on room air, normal by my interpretation.    COORDINATION OF CARE: 15:21- Evaluated Pt. Pt is awake, alert, and oriented.   Labs Reviewed  BASIC METABOLIC PANEL - Abnormal; Notable for the following:    Sodium 134 (*)     All other components within normal limits  URINALYSIS, ROUTINE W REFLEX MICROSCOPIC - Abnormal; Notable for the following:    Color, Urine STRAW (*)     Specific Gravity, Urine <1.005 (*)     Hgb urine dipstick SMALL (*)     All other components within normal limits  URINE MICROSCOPIC-ADD ON - Abnormal; Notable for the following:    Squamous Epithelial / LPF FEW (*)     All other components within normal limits  CBC WITH DIFFERENTIAL   3:38 PM  CT tech advised that pt has known allergy to contrast dye, but has done well with Solumedrol and Benadryl.    Results for orders placed during the hospital encounter of 05/28/12  CBC WITH DIFFERENTIAL      Component Value Range   WBC 9.7  4.0 - 10.5 K/uL   RBC 4.70  3.87 - 5.11 MIL/uL   Hemoglobin 14.5  12.0 - 15.0 g/dL   HCT 29.5  62.1 - 30.8 %   MCV 89.6  78.0 - 100.0 fL   MCH 30.9  26.0 - 34.0 pg   MCHC 34.4  30.0 - 36.0 g/dL   RDW 65.7  84.6 - 96.2 %   Platelets 289  150 - 400 K/uL   Neutrophils Relative 60  43 - 77 %   Neutro Abs 5.8  1.7 - 7.7 K/uL   Lymphocytes Relative 29  12 - 46 %   Lymphs Abs 2.8  0.7 - 4.0 K/uL   Monocytes Relative 10  3  - 12 %   Monocytes Absolute 1.0  0.1 - 1.0 K/uL   Eosinophils Relative 1  0 - 5 %   Eosinophils Absolute 0.1  0.0 - 0.7 K/uL   Basophils Relative 0  0 - 1 %   Basophils Absolute 0.0  0.0 - 0.1 K/uL  BASIC METABOLIC PANEL      Component Value Range   Sodium 134 (*) 135 - 145 mEq/L   Potassium 3.6  3.5 - 5.1 mEq/L   Chloride 96  96 - 112 mEq/L   CO2 32  19 - 32 mEq/L   Glucose, Bld 86  70 - 99 mg/dL   BUN 11  6 - 23 mg/dL   Creatinine, Ser 9.52  0.50 - 1.10 mg/dL   Calcium 9.3  8.4 - 84.1 mg/dL   GFR calc non Af Amer >90  >90 mL/min   GFR calc Af Amer >90  >90 mL/min  URINALYSIS, ROUTINE W REFLEX MICROSCOPIC      Component Value Range   Color, Urine STRAW (*) YELLOW   APPearance CLEAR  CLEAR   Specific Gravity, Urine <1.005 (*) 1.005 - 1.030   pH 6.5  5.0 - 8.0   Glucose, UA NEGATIVE  NEGATIVE mg/dL   Hgb urine dipstick SMALL (*) NEGATIVE   Bilirubin Urine NEGATIVE  NEGATIVE   Ketones, ur NEGATIVE  NEGATIVE mg/dL   Protein, ur NEGATIVE  NEGATIVE mg/dL   Urobilinogen, UA 0.2  0.0 - 1.0 mg/dL   Nitrite NEGATIVE  NEGATIVE   Leukocytes, UA NEGATIVE  NEGATIVE  URINE MICROSCOPIC-ADD ON      Component Value Range  Squamous Epithelial / LPF FEW (*) RARE   WBC, UA 0-2  <3 WBC/hpf   RBC / HPF 0-2  <3 RBC/hpf  HEPATIC FUNCTION PANEL      Component Value Range   Total Protein 6.5  6.0 - 8.3 g/dL   Albumin 3.5  3.5 - 5.2 g/dL   AST 10  0 - 37 U/L   ALT 5  0 - 35 U/L   Alkaline Phosphatase 67  39 - 117 U/L   Total Bilirubin 0.3  0.3 - 1.2 mg/dL   Bilirubin, Direct <6.0  0.0 - 0.3 mg/dL   Indirect Bilirubin NOT CALCULATED  0.3 - 0.9 mg/dL  LIPASE, BLOOD      Component Value Range   Lipase 31  11 - 59 U/L   Ct Abdomen Pelvis W Contrast  05/28/2012  *RADIOLOGY REPORT*  Clinical Data: Right upper quadrant abdominal pain.  Prior history of pancreatitis.History cervical cancer.  COPD.  Gastroesophageal reflux disease.  CT ABDOMEN AND PELVIS WITH CONTRAST  Technique:  Multidetector CT  imaging of the abdomen and pelvis was performed following the standard protocol during bolus administration of intravenous contrast.  Contrast:  75 ml Omnipaque-300  Comparison: 02/12/2012  Findings: Centrilobular emphysema at both lung bases. Normal heart size without pericardial or pleural effusion.  Possible mild hepatic steatosis.  No focal liver lesions.  Normal spleen, stomach.  There is no evidence of acute pancreatitis.  No pancreatic ductal dilatation.  There are calcifications involving the pancreatic head/uncinate process on image 27 which are unchanged. Calcifications appear separate from the common duct and are not causing biliary or pancreatic ductal dilatation.  Normal gallbladder.  Normal adrenal glands.  Right-sided renal lesions which are likely cysts.  Normal left kidney.  No retroperitoneal or retrocrural adenopathy.  Normal colon and terminal ileum.  Appendix is not visualized but there is no evidence of right lower quadrant inflammation.  Normal small bowel without abdominal ascites.    No pelvic adenopathy.    Normal urinary bladder.  Hysterectomy. No adnexal mass.  No significant free fluid.  Right-sided Tarlov cysts.  Mild osteopenia.  IMPRESSION:  1. No acute process in the abdomen or pelvis. 2.  Findings of chronic calcific pancreatitis, as before.  No evidence of acute pancreatitis or other complication.   Original Report Authenticated By: Consuello Bossier, M.D.    Ct Maxillofacial Wo Cm  05/28/2012  *RADIOLOGY REPORT*  Clinical Data: Right lower jaw pain for 2 weeks.  The patient is edentulous.  CT MAXILLOFACIAL WITHOUT CONTRAST  Technique:  Multidetector CT imaging of the maxillofacial structures was performed. Multiplanar CT image reconstructions were also generated.  Comparison: None.  Findings: CT appearance of the mandible is within normal limits with no evidence of bony destruction, abscess, lesion or fracture. The temporomandibular joints show normal alignment.  All of the rest  of the facial bones are normal in appearance without evidence of fracture or focal abnormality.  No soft tissue inflammatory process or abnormal fluid collection is identified.  The paranasal sinuses are normally aerated.  Orbits are symmetric and normal in appearance.  No evidence of mastoiditis.  The visualized airway is normally patent.  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  IMPRESSION: Unremarkable maxillofacial CT demonstrating no findings to explain right mandibular pain.   Original Report Authenticated By: Reola Calkins, M.D.     Lab tests were negative.  CT maxillofacial was negative.  CT abdomen/pelvis shows chronic  pancreatitis.  Advised Percocet for pain, F/U with Dr. Renard Matter in office next week.  1. Chronic pancreatitis        Carleene Cooper III, MD 05/28/12 [phone]

## 2012-05-28 NOTE — ED Notes (Signed)
Pt states pain to right abdomen. Nausea. Began at 0500. Hx of pancreatitis. Pt also states infection to mouth.

## 2012-05-28 NOTE — ED Notes (Signed)
Patient discharged home in good condition.  Patient ambulatory with steady gait. 

## 2012-06-14 ENCOUNTER — Ambulatory Visit (INDEPENDENT_AMBULATORY_CARE_PROVIDER_SITE_OTHER): Payer: PRIVATE HEALTH INSURANCE | Admitting: Gastroenterology

## 2012-06-14 ENCOUNTER — Encounter: Payer: Self-pay | Admitting: Gastroenterology

## 2012-06-14 VITALS — BP 90/60 | HR 62 | Temp 98.6°F | Ht 65.0 in | Wt 78.6 lb

## 2012-06-14 DIAGNOSIS — R110 Nausea: Secondary | ICD-10-CM

## 2012-06-14 DIAGNOSIS — K5900 Constipation, unspecified: Secondary | ICD-10-CM

## 2012-06-14 DIAGNOSIS — Z86010 Personal history of colonic polyps: Secondary | ICD-10-CM

## 2012-06-14 NOTE — Progress Notes (Signed)
Faxed to PCP

## 2012-06-14 NOTE — Progress Notes (Signed)
Referring Provider: Alice Reichert, MD Primary Care Physician:  Alice Reichert, MD Gastroenterologist: Dr. Jena Gauss   Chief Complaint  Patient presents with  . Follow-up    HPI:   Here for 6-week f/u. Hx of chronic pancreatitis, wt loss with negative CT scan to r/o occult malignancy, GERD, colonic adenomas with need for surveillance colonoscopy in 2015. Negative for celiac and adrenal insufficiency. Last visit noted constant nausea, loss of appetite, constipation worsening. Last EGD in 2006. GES was ordered but never completed. Amitiza trial.   Returns today stating has ear pain, lower gum pain. Referred to a surgeon, has to wait a few months to be seen. Nausea is unchanged. Was unable to complete GES last time. Nausea worsened after eating. Decreased appetite. Early satiety. Main thing that bothers her: bowels won't move. Takes week to two weeks at times. Taking Amitiza twice a day. States only slight improvement with bowel movements. Abdominal cramping with constipation. Feels like has to go all the time but can't.    Past Medical History  Diagnosis Date  . CAD (coronary artery disease)     palpitations, dizziness, chest pain  . Hx of colonic polyps     adenomatous  . Tobacco abuse   . Back pain, chronic   . Cervical cancer   . Allergic rhinitis   . Anxiety and depression   . Nephrolithiasis   . Pancreatitis chronic   . Hematochezia   . GERD (gastroesophageal reflux disease)   . COPD (chronic obstructive pulmonary disease)   . Weight loss     CT negative for occult malignancy, negative celiac, negative adrenal insufficiency  . Renal calculus   . Hyperlipidemia     Lipid profile on 02/25/2011: 209, 113, 55, 132    Past Surgical History  Procedure Date  . Partial hysterectomy   . Appendectomy   . Colonoscopy w/ polypectomy 2006    hemorrhoids, benign gastric nodule, multiple adenomatous polyps, one with tubular morphology  . Ileocolonoscopy 01/11/2009    Polyp at the  splenic flexure, status post hot snare removal/ Normal rectum, tubular adenoma  . Esophagogastroduodenoscopy 2006    benign gastric nodule    Current Outpatient Prescriptions  Medication Sig Dispense Refill  . alendronate (FOSAMAX) 70 MG tablet Take 70 mg by mouth every 7 (seven) days. Take with a full glass of water on an empty stomach. Patient take on monday      . Calcium Carbonate (CALCIUM 600 PO) Take 1 tablet by mouth daily.        Marland Kitchen CREON 24000 UNITS CPEP TAKE 2 CAPSULES WITH EACH MEALS AND 1 CAPSULE WITH SNACKS.  240 each  5  . docusate sodium (COLACE) 100 MG capsule Take 100 mg by mouth daily.       . DULoxetine (CYMBALTA) 60 MG capsule Take 60 mg by mouth daily.        . fentaNYL (DURAGESIC - DOSED MCG/HR) 75 MCG/HR Place 1 patch onto the skin every 3 (three) days.      . Fluticasone-Salmeterol (ADVAIR DISKUS) 100-50 MCG/DOSE AEPB Inhale 1 puff into the lungs every 12 (twelve) hours.        Marland Kitchen LORazepam (ATIVAN) 0.5 MG tablet Take 0.5 mg by mouth every 4 (four) hours as needed. Take every 4 to 6 hours as needed for nerves      . metoprolol (LOPRESSOR) 50 MG tablet Take 25 mg by mouth 2 (two) times daily.        . nitroGLYCERIN (NITROSTAT)  0.4 MG SL tablet Place 1 tablet (0.4 mg total) under the tongue every 5 (five) minutes as needed.  100 tablet  3  . omeprazole (PRILOSEC) 20 MG capsule Take 20 mg by mouth daily.       . promethazine (PHENERGAN) 25 MG tablet Take 25 mg by mouth every 6 (six) hours as needed.        Allergies as of 06/14/2012 - Review Complete 06/14/2012  Allergen Reaction Noted  . Ibuprofen Anaphylaxis 12/19/2008  . Iohexol Hives and Swelling 05/23/2004    Family History  Problem Relation Age of Onset  . Heart attack Father 60  . Colon cancer Maternal Grandmother     History   Social History  . Marital Status: Divorced    Spouse Name: N/A    Number of Children: 3  . Years of Education: N/A   Occupational History  . disabled    Social History Main  Topics  . Smoking status: Current Every Day Smoker -- 1.0 packs/day    Types: Cigarettes  . Smokeless tobacco: Never Used  . Alcohol Use: No     hx of ETOH about 20 years ago.   . Drug Use: No  . Sexually Active: None   Other Topics Concern  . None   Social History Narrative  . None    Review of Systems: Gen: SEE HPI CV: Denies chest pain, palpitations, syncope, peripheral edema, and claudication. Resp: Denies dyspnea at rest, cough, wheezing, coughing up blood, and pleurisy. GI: Denies vomiting blood, jaundice, and fecal incontinence.   Denies dysphagia or odynophagia. Derm: Denies rash, itching, dry skin Psych: Denies depression, anxiety, memory loss, confusion. No homicidal or suicidal ideation.  Heme: Denies bruising, bleeding, and enlarged lymph nodes.  Physical Exam: BP 90/60  Pulse 62  Temp 98.6 F (37 C) (Temporal)  Ht 5\' 5"  (1.651 m)  Wt 78 lb 9.6 oz (35.653 kg)  BMI 13.08 kg/m2 General:   Alert and oriented. No distress noted. Pleasant and cooperative. Thin.  Head:  Normocephalic and atraumatic. Eyes:  Conjuctiva clear without scleral icterus. Neck:  Supple, without mass or thyromegaly. Heart:  S1, S2 present without murmurs, rubs, or gallops. Regular rate and rhythm. Abdomen:  +BS, soft, non-tender and non-distended. No rebound or guarding. No HSM or masses noted. Msk:  Symmetrical without gross deformities. Slight kyphosis noted.  Extremities:  Without edema. Neurologic:  Alert and  oriented x4;  grossly normal neurologically. Skin:  Intact without significant lesions or rashes. Cervical Nodes:  No significant cervical adenopathy. Psych:  Alert and cooperative. Normal mood and affect.

## 2012-06-14 NOTE — Assessment & Plan Note (Signed)
Chronic, tcs up to date. Failed Amitiza 8 mcg BID. Trial of 24 mcg BID. I have also provided Linzess, which she will not start until after completing short sample supply of Amitiza. She is to let me know how Amitiza does for her, and if she notes improvement with 24 mcg, we will continue this. If not, she will trial Linzess. Due to insurance reasons, she must have tried and failed Amitiza before linzess can be approved.  3 mos f/u

## 2012-06-14 NOTE — Patient Instructions (Addendum)
We have set you up for a gastric emptying study. Please let us know if you have to cancel this.   Please see the following steps for how we are going to treat your constipation. I want to find the best medication that works for you.   Step #1: The first one is Amitiza 24 mcg. This is a higher dose than what you have been taking. Start taking this twice a day WITH FOOD. Finish the samples provided. If you notice improvement with this, we will continue with this dosage. IF NOT, then follow step # 2.   Step #2: Next, I want you to try Linzess 290 mcg. This is only ONCE A DAY, 30 minutes before eating.   We will see you back in 3 months or sooner if necessary.

## 2012-06-14 NOTE — Assessment & Plan Note (Signed)
No GES on file. As her symptoms have not worsened over time, I'd like to hold off on an EGD at this point. Appears to be chronic in nature.

## 2012-06-17 ENCOUNTER — Encounter (HOSPITAL_COMMUNITY): Payer: Self-pay

## 2012-06-17 ENCOUNTER — Encounter (HOSPITAL_COMMUNITY)
Admission: RE | Admit: 2012-06-17 | Discharge: 2012-06-17 | Disposition: A | Discharge status: Home / Self Care | Payer: PRIVATE HEALTH INSURANCE | Source: Ambulatory Visit | Attending: Gastroenterology | Admitting: Gastroenterology

## 2012-06-17 DIAGNOSIS — R110 Nausea: Secondary | ICD-10-CM | POA: Insufficient documentation

## 2012-06-17 DIAGNOSIS — R109 Unspecified abdominal pain: Secondary | ICD-10-CM | POA: Insufficient documentation

## 2012-06-17 MED ORDER — TECHNETIUM TC 99M SULFUR COLLOID
2.0000 | Freq: Once | INTRAVENOUS | Status: AC | PRN
  Administered 2012-06-17: 2.2 via ORAL

## 2012-06-22 NOTE — Progress Notes (Signed)
Quick Note:  Negative for gastroparesis. How is patient doing with Amitiza? Has she tried Linzess as well?   ______

## 2012-07-26 NOTE — Progress Notes (Signed)
Quick Note:  She should try the Linzess samples I gave her. If she needs more, can provide Linzess 290 mcg ______

## 2012-07-29 ENCOUNTER — Encounter (HOSPITAL_COMMUNITY): Payer: Self-pay

## 2012-07-29 ENCOUNTER — Emergency Department (HOSPITAL_COMMUNITY)
Admission: EM | Admit: 2012-07-29 | Discharge: 2012-07-30 | Disposition: A | Discharge status: Home / Self Care | Payer: PRIVATE HEALTH INSURANCE | Attending: Emergency Medicine | Admitting: Emergency Medicine

## 2012-07-29 DIAGNOSIS — K219 Gastro-esophageal reflux disease without esophagitis: Secondary | ICD-10-CM | POA: Insufficient documentation

## 2012-07-29 DIAGNOSIS — Z79899 Other long term (current) drug therapy: Secondary | ICD-10-CM | POA: Insufficient documentation

## 2012-07-29 DIAGNOSIS — F17200 Nicotine dependence, unspecified, uncomplicated: Secondary | ICD-10-CM | POA: Insufficient documentation

## 2012-07-29 DIAGNOSIS — J309 Allergic rhinitis, unspecified: Secondary | ICD-10-CM | POA: Insufficient documentation

## 2012-07-29 DIAGNOSIS — E785 Hyperlipidemia, unspecified: Secondary | ICD-10-CM | POA: Insufficient documentation

## 2012-07-29 DIAGNOSIS — Z8719 Personal history of other diseases of the digestive system: Secondary | ICD-10-CM | POA: Insufficient documentation

## 2012-07-29 DIAGNOSIS — I2510 Atherosclerotic heart disease of native coronary artery without angina pectoris: Secondary | ICD-10-CM | POA: Insufficient documentation

## 2012-07-29 DIAGNOSIS — J029 Acute pharyngitis, unspecified: Secondary | ICD-10-CM | POA: Insufficient documentation

## 2012-07-29 DIAGNOSIS — Z9889 Other specified postprocedural states: Secondary | ICD-10-CM | POA: Insufficient documentation

## 2012-07-29 DIAGNOSIS — G8929 Other chronic pain: Secondary | ICD-10-CM | POA: Insufficient documentation

## 2012-07-29 DIAGNOSIS — Z8541 Personal history of malignant neoplasm of cervix uteri: Secondary | ICD-10-CM | POA: Insufficient documentation

## 2012-07-29 DIAGNOSIS — Z860100 Personal history of colon polyps, unspecified: Secondary | ICD-10-CM | POA: Insufficient documentation

## 2012-07-29 DIAGNOSIS — M549 Dorsalgia, unspecified: Secondary | ICD-10-CM | POA: Insufficient documentation

## 2012-07-29 DIAGNOSIS — Z8739 Personal history of other diseases of the musculoskeletal system and connective tissue: Secondary | ICD-10-CM | POA: Insufficient documentation

## 2012-07-29 DIAGNOSIS — H9209 Otalgia, unspecified ear: Secondary | ICD-10-CM | POA: Insufficient documentation

## 2012-07-29 DIAGNOSIS — Z87442 Personal history of urinary calculi: Secondary | ICD-10-CM | POA: Insufficient documentation

## 2012-07-29 DIAGNOSIS — F3289 Other specified depressive episodes: Secondary | ICD-10-CM | POA: Insufficient documentation

## 2012-07-29 DIAGNOSIS — J4489 Other specified chronic obstructive pulmonary disease: Secondary | ICD-10-CM | POA: Insufficient documentation

## 2012-07-29 DIAGNOSIS — J449 Chronic obstructive pulmonary disease, unspecified: Secondary | ICD-10-CM | POA: Insufficient documentation

## 2012-07-29 DIAGNOSIS — F411 Generalized anxiety disorder: Secondary | ICD-10-CM | POA: Insufficient documentation

## 2012-07-29 DIAGNOSIS — F329 Major depressive disorder, single episode, unspecified: Secondary | ICD-10-CM | POA: Insufficient documentation

## 2012-07-29 DIAGNOSIS — H9202 Otalgia, left ear: Secondary | ICD-10-CM

## 2012-07-29 MED ORDER — DEXAMETHASONE 4 MG PO TABS
12.0000 mg | ORAL_TABLET | ORAL | Status: DC
  Filled 2012-07-29: qty 3

## 2012-07-29 MED ORDER — TRAMADOL HCL 50 MG PO TABS
50.0000 mg | ORAL_TABLET | Freq: Once | ORAL | Status: AC
  Administered 2012-07-30: 50 mg via ORAL
  Filled 2012-07-29: qty 1

## 2012-07-29 NOTE — ED Provider Notes (Signed)
History   This chart was scribed for Dione Booze, MD by Toya Smothers. The patient was seen in room APA11/APA11. Patient's care was started at 2321.  CSN: [id number]  Arrival date & time 07/29/12  2321   First MD Initiated Contact with Patient 07/29/12 2331      Chief Complaint  Patient presents with  . Sore Throat  . Otalgia   Patient is a 66 y.o. female presenting with pharyngitis and ear pain. The history is provided by the patient. No language interpreter was used.  Sore Throat This is a recurrent problem. The current episode started more than 1 week ago. The problem occurs constantly. The problem has not changed since onset.Pertinent negatives include no chest pain, no abdominal pain, no headaches and no shortness of breath. The symptoms are aggravated by swallowing. Nothing relieves the symptoms. Treatments tried: antibiotics. The treatment provided moderate relief.  Otalgia This is a new problem. The current episode started more than 1 week ago. There is pain in the left ear. The problem occurs constantly. The problem has not changed since onset.There has been no fever. The pain is mild. Associated symptoms include sore throat. Pertinent negatives include no headaches and no abdominal pain. Her past medical history does not include hearing loss.    [name] is a 66 y.o. female with a h.o COPD, tobacco abuse, and cervical cancer who presents to the Emergency Department complaining of 1 week of recurrent constant sore throat and left ear otalgia with associate chills. Overall pain is 6/10. Symptoms began 1 month ago, subsided for a week, and have gradually worsened over the past several days. She was evaluated by PCP and completed Rx of antibiotics 1 week ago. She also denotes sick contact with possible Strep-Throat. No fever, cough, congestion, rhinorrhea, chest pain, SOB, or n/v/d. Pt is a current everyday smoker.   Past Medical History  Diagnosis Date  . CAD (coronary artery  disease)     palpitations, dizziness, chest pain  . Hx of colonic polyps     adenomatous  . Tobacco abuse   . Back pain, chronic   . Cervical cancer   . Allergic rhinitis   . Anxiety and depression   . Nephrolithiasis   . Pancreatitis chronic   . Hematochezia   . GERD (gastroesophageal reflux disease)   . COPD (chronic obstructive pulmonary disease)   . Weight loss     CT negative for occult malignancy, negative celiac, negative adrenal insufficiency  . Renal calculus   . Hyperlipidemia     Lipid profile on 02/25/2011: 209, 113, 55, 132    Past Surgical History  Procedure Date  . Partial hysterectomy   . Appendectomy   . Colonoscopy w/ polypectomy 2006    hemorrhoids, benign gastric nodule, multiple adenomatous polyps, one with tubular morphology  . Ileocolonoscopy 01/11/2009    Polyp at the splenic flexure, status post hot snare removal/ Normal rectum, tubular adenoma  . Esophagogastroduodenoscopy 2006    benign gastric nodule    Family History  Problem Relation Age of Onset  . Heart attack Father 13  . Colon cancer Maternal Grandmother     History  Substance Use Topics  . Smoking status: Current Every Day Smoker -- 1.0 packs/day    Types: Cigarettes  . Smokeless tobacco: Never Used  . Alcohol Use: No     hx of ETOH about 20 years ago.    Review of Systems  HENT: Positive for ear pain and sore throat.  Respiratory: Negative for shortness of breath.   Cardiovascular: Negative for chest pain.  Gastrointestinal: Negative for abdominal pain.  Neurological: Negative for headaches.  All other systems reviewed and are negative.    Allergies  Ibuprofen and Iohexol  Home Medications   Current Outpatient Rx  Name Route Sig Dispense Refill  . ALENDRONATE SODIUM 70 MG PO TABS Oral Take 70 mg by mouth every 7 (seven) days. Take with a full glass of water on an empty stomach. Patient take on monday    . CALCIUM 600 PO Oral Take 1 tablet by mouth daily.      Marland Kitchen  CREON 24000 UNITS PO CPEP  TAKE 2 CAPSULES WITH EACH MEALS AND 1 CAPSULE WITH SNACKS. 240 each 5  . DOCUSATE SODIUM 100 MG PO CAPS Oral Take 100 mg by mouth daily.     . DULOXETINE HCL 60 MG PO CPEP Oral Take 60 mg by mouth daily.      . FENTANYL 75 MCG/HR TD PT72 Transdermal Place 1 patch onto the skin every 3 (three) days.    Marland Kitchen FLUTICASONE-SALMETEROL 100-50 MCG/DOSE IN AEPB Inhalation Inhale 1 puff into the lungs every 12 (twelve) hours.      Marland Kitchen LORAZEPAM 0.5 MG PO TABS Oral Take 0.5 mg by mouth every 4 (four) hours as needed. Take every 4 to 6 hours as needed for nerves    . METOPROLOL TARTRATE 50 MG PO TABS Oral Take 25 mg by mouth 2 (two) times daily.      Marland Kitchen NITROGLYCERIN 0.4 MG SL SUBL Sublingual Place 1 tablet (0.4 mg total) under the tongue every 5 (five) minutes as needed. 100 tablet 3  . OMEPRAZOLE 20 MG PO CPDR Oral Take 20 mg by mouth daily.     Marland Kitchen PROMETHAZINE HCL 25 MG PO TABS Oral Take 25 mg by mouth every 6 (six) hours as needed.      BP 128/76  Pulse 68  Temp 98.7 F (37.1 C) (Oral)  Resp 20  Ht 5\' 4"  (1.626 m)  Wt 78 lb (35.381 kg)  BMI 13.39 kg/m2  SpO2 98%  Physical Exam  Constitutional: She is oriented to person, place, and time. No distress.  HENT:  Head: Normocephalic and atraumatic.  Mouth/Throat: No oropharyngeal exudate.       Pharynx is mildly erythematous. No difficulty with secretions. Normal phonations.   Eyes: EOM are normal. Pupils are equal, round, and reactive to light. No scleral icterus.  Neck:       Shotty left submandibular anterior and posterior lymph adenopathy.  Cardiovascular: Normal rate, regular rhythm and normal heart sounds.   No murmur heard. Pulmonary/Chest: Effort normal and breath sounds normal. No respiratory distress.  Abdominal: Soft. Bowel sounds are normal. There is no tenderness.  Neurological: She is alert and oriented to person, place, and time. Coordination normal.  Skin: Skin is warm and dry. No rash noted. She is not  diaphoretic.  Psychiatric: She has a normal mood and affect. Her behavior is normal.    ED Course  Procedures DIAGNOSTIC STUDIES: Oxygen Saturation is 98% on room air, normal by my interpretation.    COORDINATION OF CARE: 23:37- Evaluated Pt. Pt is awake, alert, and without distress. 23:42- Patient  informed of clinical course, understand medical decision-making process, and agree with plan.   Results for orders placed during the hospital encounter of 07/29/12  RAPID STREP SCREEN      Component Value Range   Streptococcus, Group A Screen (Direct) NEGATIVE  NEGATIVE      1. Pharyngitis   2. Otalgia of left ear       MDM  Pharyngitis which is probably viral. However, if she was exposed to a family member who was diagnosed with strep. It is not clear whether this was a clinical diagnosis or a culture diagnosis. Strep screen will be obtained and she's given a dose of dexamethasone. Your pain seems to be referred from the throat as your exam is normal  Rapid strep screen is negative. Swab is sent for strep DNA testing. In the meantime, she will be treated symptomatically with and given a prescription for Percocet.     I personally performed the services described in this documentation, which was scribed in my presence. The recorded information has been reviewed and considered.       Dione Booze, MD 07/30/12 [phone]

## 2012-07-29 NOTE — ED Notes (Signed)
Sore throat and left ear pain per pt. Have been treated by my MD for the same per pt.

## 2012-07-29 NOTE — ED Notes (Addendum)
Patient states she has had problems with a sore throat and earache for over 1 month.  States that she visited her doctor and was taking antibiotics with no improvement.

## 2012-07-30 LAB — RAPID STREP SCREEN (MED CTR MEBANE ONLY): Streptococcus, Group A Screen (Direct): NEGATIVE

## 2012-07-30 MED ORDER — DEXAMETHASONE 10 MG/ML FOR PEDIATRIC ORAL USE
10.0000 mg | Freq: Once | INTRAMUSCULAR | Status: AC
Start: 2012-07-30 — End: 2012-07-30
  Administered 2012-07-30: 10 mg via ORAL

## 2012-07-30 MED ORDER — DEXAMETHASONE 10 MG/ML FOR PEDIATRIC ORAL USE
INTRAMUSCULAR | Status: AC
  Administered 2012-07-30: 10 mg via ORAL
  Filled 2012-07-30: qty 1

## 2012-07-30 MED ORDER — OXYCODONE-ACETAMINOPHEN 5-325 MG PO TABS: 1.0000 | ORAL_TABLET | ORAL | Status: DC | PRN

## 2012-08-01 ENCOUNTER — Other Ambulatory Visit: Payer: Self-pay

## 2012-08-01 NOTE — Progress Notes (Signed)
Pt doing a little better but she would like a refill on her Linzess 290 if possible

## 2012-08-03 MED ORDER — LINACLOTIDE 290 MCG PO CAPS: 290.0000 ug | ORAL_CAPSULE | Freq: Every day | ORAL | Status: DC

## 2012-08-04 ENCOUNTER — Other Ambulatory Visit (HOSPITAL_COMMUNITY): Payer: Self-pay | Admitting: Family Medicine

## 2012-08-04 DIAGNOSIS — Z139 Encounter for screening, unspecified: Secondary | ICD-10-CM

## 2012-08-11 ENCOUNTER — Ambulatory Visit (HOSPITAL_COMMUNITY)
Admission: RE | Admit: 2012-08-11 | Discharge: 2012-08-11 | Disposition: A | Discharge status: Home / Self Care | Payer: PRIVATE HEALTH INSURANCE | Source: Ambulatory Visit | Attending: Family Medicine | Admitting: Family Medicine

## 2012-08-11 ENCOUNTER — Other Ambulatory Visit (HOSPITAL_COMMUNITY): Payer: Self-pay | Admitting: Family Medicine

## 2012-08-11 DIAGNOSIS — J449 Chronic obstructive pulmonary disease, unspecified: Secondary | ICD-10-CM

## 2012-08-11 DIAGNOSIS — Z1231 Encounter for screening mammogram for malignant neoplasm of breast: Secondary | ICD-10-CM | POA: Insufficient documentation

## 2012-08-11 DIAGNOSIS — Z139 Encounter for screening, unspecified: Secondary | ICD-10-CM

## 2012-09-13 ENCOUNTER — Ambulatory Visit: Payer: PRIVATE HEALTH INSURANCE | Admitting: Gastroenterology

## 2012-10-03 ENCOUNTER — Encounter: Payer: Self-pay | Admitting: Internal Medicine

## 2012-10-04 ENCOUNTER — Ambulatory Visit: Payer: PRIVATE HEALTH INSURANCE | Admitting: Gastroenterology

## 2012-10-19 ENCOUNTER — Ambulatory Visit (INDEPENDENT_AMBULATORY_CARE_PROVIDER_SITE_OTHER): Payer: PRIVATE HEALTH INSURANCE | Admitting: Gastroenterology

## 2012-10-19 ENCOUNTER — Encounter: Payer: Self-pay | Admitting: Gastroenterology

## 2012-10-19 VITALS — BP 107/72 | HR 82 | Temp 98.4°F | Ht 63.0 in | Wt 81.2 lb

## 2012-10-19 DIAGNOSIS — K861 Other chronic pancreatitis: Secondary | ICD-10-CM

## 2012-10-19 DIAGNOSIS — K5900 Constipation, unspecified: Secondary | ICD-10-CM

## 2012-10-19 DIAGNOSIS — R1011 Right upper quadrant pain: Secondary | ICD-10-CM | POA: Insufficient documentation

## 2012-10-19 NOTE — Progress Notes (Addendum)
Referring Provider: Alice Reichert, MD Primary Care Physician:  Alice Reichert, MD Primary Gastroenterologist: Dr. Jena Gauss    Chief Complaint  Patient presents with  . Follow-up    HPI:   67 year old female here for routine follow-up. Hx of chronic pancreatitis, wt loss with negative CT scan to r/o occult malignancy, GERD, colonic adenomas with need for surveillance colonoscopy in 2015. Negative for celiac and adrenal insufficiency. Last EGD 2006. +constipation at last visit, now on Linzess 290 daily. Failed Amitiza in past. GES ordered due to nausea: negative. States still only has BM about once a week. This is actually improved from before. States this is "normal" for her. Previously would have a BM every few weeks. No melena. No hematochezia. GERD controlled with Prilosec daily. Occasional early satiety.  Wt actually up 3 lbs from September. Continued nausea, not associated with eating. Intermittent. +discomfort RUQ pain with eating, chronic, states worse than normal. No problems swallowing.   Recent CT on file without evidence for gallstones, no [place] of abdomen recently.    Past Medical History  Diagnosis Date  . CAD (coronary artery disease)     palpitations, dizziness, chest pain  . Hx of colonic polyps     adenomatous  . Tobacco abuse   . Back pain, chronic   . Cervical cancer   . Allergic rhinitis   . Anxiety and depression   . Nephrolithiasis   . Pancreatitis chronic   . Hematochezia   . GERD (gastroesophageal reflux disease)   . COPD (chronic obstructive pulmonary disease)   . Weight loss     CT negative for occult malignancy, negative celiac, negative adrenal insufficiency  . Renal calculus   . Hyperlipidemia     Lipid profile on 02/25/2011: 209, 113, 55, 132    Past Surgical History  Procedure Date  . Partial hysterectomy   . Appendectomy   . Colonoscopy w/ polypectomy 2006    [id number], benign gastric nodule, multiple adenomatous polyps, one with tubular  morphology  . Ileocolonoscopy 01/11/2009    RMR: Polyp at the splenic flexure, status post hot snare removal/ Normal rectum, tubular adenoma  . Esophagogastroduodenoscopy 2006    [id number] gastric nodule  . Eus 2010    Dr. Christella Hartigan : Multiple shadowing calcifications in pancreas, consistent with Chronic Pancreatitis.  These are mainly confined to a collection of calcifications in head/uncinate pancreas. Otherwise the pancreatic parenchyma appears fairly normal.  The main pancreatic duct, CBD are both normal without stones, dilation.      Current Outpatient Prescriptions  Medication Sig Dispense Refill  . ADVAIR DISKUS 250-50 MCG/DOSE AEPB 1 puff.       Marland Kitchen alendronate (FOSAMAX) 70 MG tablet Take 70 mg by mouth every 7 (seven) days. Take with a full glass of water on an empty stomach. Patient take on monday      . Calcium Carbonate (CALCIUM 600 PO) Take 1 tablet by mouth daily.        Marland Kitchen CREON 24000 UNITS CPEP TAKE 2 CAPSULES WITH EACH MEALS AND 1 CAPSULE WITH SNACKS.  240 each  5  . docusate sodium (COLACE) 100 MG capsule Take 100 mg by mouth daily.       . DULoxetine (CYMBALTA) 60 MG capsule Take 60 mg by mouth daily.        . fentaNYL (DURAGESIC - DOSED MCG/HR) 75 MCG/HR Place 1 patch onto the skin every 3 (three) days.      . Linaclotide (LINZESS) 290 MCG CAPS Take 290 mcg  by mouth daily.  30 capsule  5  . LORazepam (ATIVAN) 0.5 MG tablet Take 0.5 mg by mouth every 4 (four) hours as needed. Take every 4 to 6 hours as needed for nerves      . metoprolol (LOPRESSOR) 50 MG tablet Take 25 mg by mouth 2 (two) times daily.        . nitroGLYCERIN (NITROSTAT) 0.4 MG SL tablet Place 1 tablet (0.4 mg total) under the tongue every 5 (five) minutes as needed.  100 tablet  3  . omeprazole (PRILOSEC) 20 MG capsule Take 20 mg by mouth daily.       Marland Kitchen oxyCODONE-acetaminophen (ROXICET) 5-325 MG per tablet Take 1 tablet by mouth every 4 (four) hours as needed for pain.  12 tablet  0  . promethazine (PHENERGAN)  25 MG tablet Take 25 mg by mouth every 6 (six) hours as needed.      . simvastatin (ZOCOR) 20 MG tablet Take 20 mg by mouth daily.         Allergies as of 10/19/2012 - Review Complete 10/19/2012  Allergen Reaction Noted  . Ibuprofen Anaphylaxis 12/19/2008  . Iohexol Hives and Swelling 05/23/2004    Family History  Problem Relation Age of Onset  . Heart attack Father 78  . Colon cancer Maternal Grandmother     History   Social History  . Marital Status: Divorced    Spouse Name: N/A    Number of Children: 3  . Years of Education: N/A   Occupational History  . disabled    Social History Main Topics  . Smoking status: Current Every Day Smoker -- 1.0 packs/day    Types: Cigarettes  . Smokeless tobacco: Never Used  . Alcohol Use: No     Comment: hx of ETOH about 20 years ago.   . Drug Use: No  . Sexually Active: None   Other Topics Concern  . None   Social History Narrative  . None    Review of Systems: Negative unless mentioned in HPI  Physical Exam: BP 107/72  Pulse 82  Temp 98.4 F (36.9 C) (Oral)  Ht 5\' 3"  (1.6 m)  Wt 81 lb 3.2 oz (36.832 kg)  BMI 14.38 kg/m2 General:   Alert and oriented. No distress noted. Pleasant and cooperative. Thin.  Head:  Normocephalic and atraumatic. Eyes:  Conjuctiva clear without scleral icterus. Neck:  Supple, without mass or thyromegaly. Heart:  S1, S2 present without murmurs, rubs, or gallops. Regular rate and rhythm. Abdomen:  +BS, soft, non-tender and non-distended. No rebound or guarding. No HSM or masses noted. Msk:  Symmetrical without gross deformities. Normal posture. Extremities:  Without edema. Neurologic:  Alert and  oriented x4;  grossly normal neurologically. Skin:  Intact without significant lesions or rashes. Cervical Nodes:  No significant cervical adenopathy. Psych:  Alert and cooperative. Normal mood and affect.

## 2012-10-19 NOTE — Progress Notes (Signed)
Pt is set up for [place] on Friday 10/21/12 at 10:00 am.

## 2012-10-19 NOTE — Assessment & Plan Note (Signed)
Actually improved, although she notes only 1 BM a week. Previously would go weeks without BM. Continue Linzess. TCS due in 2015.

## 2012-10-19 NOTE — Assessment & Plan Note (Signed)
Chronic. Pt notes slight increase in severity, however. +chronic nausea. With her hx of chronic pancreatitis, may be secondary to this. CT on file without evidence for gallstones; however, [place] of abd would be best modality to assess this further. She has no recent US on file. HFP in Aug was normal. Wt stable. No acute signs currently. [place] of abdomen in near future. Consider HIDA if necessary. Pt does not want to pursue EGD at this time.

## 2012-10-19 NOTE — Patient Instructions (Addendum)
We have set you up for an ultrasound of your belly. Further recommendations once completed.  We will see you back in 6 months or sooner if needed.

## 2012-10-19 NOTE — Assessment & Plan Note (Signed)
Continue Creon. 6 mos f/u.

## 2012-10-21 ENCOUNTER — Other Ambulatory Visit (HOSPITAL_COMMUNITY): Payer: PRIVATE HEALTH INSURANCE

## 2012-10-21 ENCOUNTER — Ambulatory Visit (HOSPITAL_COMMUNITY): Payer: PRIVATE HEALTH INSURANCE

## 2012-10-27 ENCOUNTER — Ambulatory Visit (HOSPITAL_COMMUNITY): Payer: PRIVATE HEALTH INSURANCE

## 2012-10-31 ENCOUNTER — Ambulatory Visit (HOSPITAL_COMMUNITY)
Admission: RE | Admit: 2012-10-31 | Discharge: 2012-10-31 | Disposition: A | Discharge status: Home / Self Care | Payer: PRIVATE HEALTH INSURANCE | Source: Ambulatory Visit | Attending: Gastroenterology | Admitting: Gastroenterology

## 2012-10-31 DIAGNOSIS — R1011 Right upper quadrant pain: Secondary | ICD-10-CM | POA: Insufficient documentation

## 2012-11-01 ENCOUNTER — Encounter: Payer: Self-pay | Admitting: Gastroenterology

## 2012-11-01 NOTE — Progress Notes (Signed)
Quick Note:  Pt aware. She is not sure if she wants to schedule HIDA or not. She is asking for LW to call her tomorrow. ______

## 2012-11-01 NOTE — Progress Notes (Signed)
Quick Note:  [place] of abdomen normal. We can pursue HIDA if pt is willing to assess for biliary dyskinesia. She does have a hx of chronic pancreatitis, and this very well may be what we are dealing with. EUS from 2010 on file. ______

## 2012-11-02 ENCOUNTER — Other Ambulatory Visit: Payer: Self-pay | Admitting: Gastroenterology

## 2012-11-02 DIAGNOSIS — R1011 Right upper quadrant pain: Secondary | ICD-10-CM

## 2012-11-02 NOTE — Progress Notes (Signed)
Patient is scheduled for HIDA on Wednesday 02/12 at 8:00 am and I have spoken to her and she is aware

## 2012-11-09 ENCOUNTER — Encounter (HOSPITAL_COMMUNITY): Payer: PRIVATE HEALTH INSURANCE

## 2012-11-15 ENCOUNTER — Encounter (HOSPITAL_COMMUNITY): Payer: PRIVATE HEALTH INSURANCE

## 2012-11-24 ENCOUNTER — Encounter (HOSPITAL_COMMUNITY): Payer: Self-pay | Admitting: Emergency Medicine

## 2012-11-24 ENCOUNTER — Emergency Department (HOSPITAL_COMMUNITY)
Admission: EM | Admit: 2012-11-24 | Discharge: 2012-11-24 | Disposition: A | Discharge status: Home / Self Care | Payer: PRIVATE HEALTH INSURANCE | Attending: Emergency Medicine | Admitting: Emergency Medicine

## 2012-11-24 DIAGNOSIS — J4489 Other specified chronic obstructive pulmonary disease: Secondary | ICD-10-CM | POA: Insufficient documentation

## 2012-11-24 DIAGNOSIS — K861 Other chronic pancreatitis: Secondary | ICD-10-CM | POA: Insufficient documentation

## 2012-11-24 DIAGNOSIS — Z860100 Personal history of colon polyps, unspecified: Secondary | ICD-10-CM | POA: Insufficient documentation

## 2012-11-24 DIAGNOSIS — E785 Hyperlipidemia, unspecified: Secondary | ICD-10-CM | POA: Insufficient documentation

## 2012-11-24 DIAGNOSIS — J449 Chronic obstructive pulmonary disease, unspecified: Secondary | ICD-10-CM | POA: Insufficient documentation

## 2012-11-24 DIAGNOSIS — Z8541 Personal history of malignant neoplasm of cervix uteri: Secondary | ICD-10-CM | POA: Insufficient documentation

## 2012-11-24 DIAGNOSIS — F3289 Other specified depressive episodes: Secondary | ICD-10-CM | POA: Insufficient documentation

## 2012-11-24 DIAGNOSIS — F17200 Nicotine dependence, unspecified, uncomplicated: Secondary | ICD-10-CM | POA: Insufficient documentation

## 2012-11-24 DIAGNOSIS — R21 Rash and other nonspecific skin eruption: Secondary | ICD-10-CM | POA: Insufficient documentation

## 2012-11-24 DIAGNOSIS — F329 Major depressive disorder, single episode, unspecified: Secondary | ICD-10-CM | POA: Insufficient documentation

## 2012-11-24 DIAGNOSIS — I2510 Atherosclerotic heart disease of native coronary artery without angina pectoris: Secondary | ICD-10-CM | POA: Insufficient documentation

## 2012-11-24 DIAGNOSIS — G8929 Other chronic pain: Secondary | ICD-10-CM | POA: Insufficient documentation

## 2012-11-24 DIAGNOSIS — K219 Gastro-esophageal reflux disease without esophagitis: Secondary | ICD-10-CM | POA: Insufficient documentation

## 2012-11-24 DIAGNOSIS — Z87442 Personal history of urinary calculi: Secondary | ICD-10-CM | POA: Insufficient documentation

## 2012-11-24 DIAGNOSIS — F411 Generalized anxiety disorder: Secondary | ICD-10-CM | POA: Insufficient documentation

## 2012-11-24 DIAGNOSIS — L299 Pruritus, unspecified: Secondary | ICD-10-CM | POA: Insufficient documentation

## 2012-11-24 DIAGNOSIS — M549 Dorsalgia, unspecified: Secondary | ICD-10-CM | POA: Insufficient documentation

## 2012-11-24 DIAGNOSIS — Z79899 Other long term (current) drug therapy: Secondary | ICD-10-CM | POA: Insufficient documentation

## 2012-11-24 DIAGNOSIS — Z8719 Personal history of other diseases of the digestive system: Secondary | ICD-10-CM | POA: Insufficient documentation

## 2012-11-24 MED ORDER — DIPHENHYDRAMINE HCL 50 MG/ML IJ SOLN
50.0000 mg | Freq: Once | INTRAMUSCULAR | Status: AC
  Administered 2012-11-24: 50 mg via INTRAMUSCULAR
  Filled 2012-11-24: qty 1

## 2012-11-24 NOTE — ED Provider Notes (Signed)
History     CSN: [id number]  Arrival date & time 11/24/12  0155   First MD Initiated Contact with Patient 11/24/12 [phone]      Chief Complaint  Patient presents with  . Rash  . Pruritis    (Consider location/radiation/quality/duration/timing/severity/associated sxs/prior treatment) The history is provided by the patient.  patient reports approximately one year of intermittent rash of both her arms and legs.  She has tried creams and hydrocortisone without improvement in her symptoms.  She's seen a dermatologist he does not know the cause of her rash.  She presents tonight because of increasing itching despite trying her on Atarax at home.  She is mainly focused on her left lateral deltoid where she feels that it is the most.  She denies fevers or chills.  No blistering noted.  No other complaints.  Her symptoms are moderate to severe in severity.  Nothing worsens or improves her symptoms.  Past Medical History  Diagnosis Date  . CAD (coronary artery disease)     palpitations, dizziness, chest pain  . Hx of colonic polyps     adenomatous  . Tobacco abuse   . Back pain, chronic   . Cervical cancer   . Allergic rhinitis   . Anxiety and depression   . Nephrolithiasis   . Pancreatitis chronic   . Hematochezia   . GERD (gastroesophageal reflux disease)   . COPD (chronic obstructive pulmonary disease)   . Weight loss     CT negative for occult malignancy, negative celiac, negative adrenal insufficiency  . Renal calculus   . Hyperlipidemia     Lipid profile on 02/25/2011: 209, 113, 55, 132    Past Surgical History  Procedure Laterality Date  . Partial hysterectomy    . Appendectomy    . Colonoscopy w/ polypectomy  2006    FAO:ZHYQMVHQION, benign gastric nodule, multiple adenomatous polyps, one with tubular morphology  . Ileocolonoscopy  01/11/2009    RMR: Polyp at the splenic flexure, status post hot snare removal/ Normal rectum, tubular adenoma  . Esophagogastroduodenoscopy   2006    GEX:BMWUXL gastric nodule  . Eus  2010    Dr. Christella Hartigan : Multiple shadowing calcifications in pancreas, consistent with Chronic Pancreatitis.  These are mainly confined to a collection of calcifications in head/uncinate pancreas. Otherwise the pancreatic parenchyma appears fairly normal.  The main pancreatic duct, CBD are both normal without stones, dilation.      Family History  Problem Relation Age of Onset  . Heart attack Father 19  . Colon cancer Maternal Grandmother     History  Substance Use Topics  . Smoking status: Current Every Day Smoker -- 1.00 packs/day    Types: Cigarettes  . Smokeless tobacco: Never Used  . Alcohol Use: No     Comment: hx of ETOH about 20 years ago.     OB History   Grav Para Term Preterm Abortions TAB SAB Ect Mult Living                  Review of Systems  All other systems reviewed and are negative.    Allergies  Ibuprofen and Iohexol  Home Medications   Current Outpatient Rx  Name  Route  Sig  Dispense  Refill  . ADVAIR DISKUS 250-50 MCG/DOSE AEPB      1 puff.          Marland Kitchen alendronate (FOSAMAX) 70 MG tablet   Oral   Take 70 mg by mouth every 7 (  seven) days. Take with a full glass of water on an empty stomach. Patient take on monday         . Calcium Carbonate (CALCIUM 600 PO)   Oral   Take 1 tablet by mouth daily.           Marland Kitchen CREON 24000 UNITS CPEP      TAKE 2 CAPSULES WITH EACH MEALS AND 1 CAPSULE WITH SNACKS.   240 each   5   . docusate sodium (COLACE) 100 MG capsule   Oral   Take 100 mg by mouth daily.          . DULoxetine (CYMBALTA) 60 MG capsule   Oral   Take 60 mg by mouth daily.           . fentaNYL (DURAGESIC - DOSED MCG/HR) 75 MCG/HR   Transdermal   Place 1 patch onto the skin every 3 (three) days.         . Linaclotide (LINZESS) 290 MCG CAPS   Oral   Take 290 mcg by mouth daily.   30 capsule   5   . LORazepam (ATIVAN) 0.5 MG tablet   Oral   Take 0.5 mg by mouth every 4 (four)  hours as needed. Take every 4 to 6 hours as needed for nerves         . metoprolol (LOPRESSOR) 50 MG tablet   Oral   Take 25 mg by mouth 2 (two) times daily.           . nitroGLYCERIN (NITROSTAT) 0.4 MG SL tablet   Sublingual   Place 1 tablet (0.4 mg total) under the tongue every 5 (five) minutes as needed.   100 tablet   3   . omeprazole (PRILOSEC) 20 MG capsule   Oral   Take 20 mg by mouth daily.          Marland Kitchen oxyCODONE-acetaminophen (ROXICET) 5-325 MG per tablet   Oral   Take 1 tablet by mouth every 4 (four) hours as needed for pain.   12 tablet   0   . promethazine (PHENERGAN) 25 MG tablet   Oral   Take 25 mg by mouth every 6 (six) hours as needed.         . simvastatin (ZOCOR) 20 MG tablet   Oral   Take 20 mg by mouth daily.            BP 149/89  Pulse 81  Temp(Src) 97.6 F (36.4 C) (Oral)  Resp 18  Ht 5\' 4"  (1.626 m)  Wt 76 lb (34.473 kg)  BMI 13.04 kg/m2  SpO2 97%  Physical Exam  Nursing note and vitals reviewed. Constitutional: She is oriented to person, place, and time. She appears well-developed and well-nourished.  HENT:  Head: Normocephalic.  Eyes: EOM are normal.  Neck: Normal range of motion.  Pulmonary/Chest: Effort normal.  Abdominal: She exhibits no distension.  Musculoskeletal: Normal range of motion.  Neurological: She is alert and oriented to person, place, and time.  Skin: Skin is warm and dry.  Patient with evidence of excoriations of her bilateral upper and lower extremities.  She has one area of her left deltoid for which she seems to be scratching the most.  There is a small amount of redness but it is not warm and does not appear to be cellulitic erythema.  He appears to be red from scratching.  No other lesions noted in this area.  No blistering or bullae noted  Psychiatric: She has a normal mood and affect.    ED Course  Procedures (including critical care time)  Labs Reviewed - No data to display No results  found.   1. Rash       MDM  Nonspecific rash. IM benadryl. Recommend lubrication and atarax at home. Has a dermatologist. rec derm follow up. No secondary signs of infection        Lyanne Co, MD 11/24/12 [phone]

## 2012-11-24 NOTE — ED Notes (Signed)
Patient complaining of generalized itching x 1 year. Denies pain.

## 2012-11-24 NOTE — ED Notes (Signed)
Pt c/o itching and rash for a while.

## 2013-03-02 ENCOUNTER — Other Ambulatory Visit (HOSPITAL_COMMUNITY): Payer: PRIVATE HEALTH INSURANCE

## 2013-03-06 ENCOUNTER — Ambulatory Visit: Admit: 2013-03-06 | Payer: Self-pay | Admitting: Ophthalmology

## 2013-03-06 SURGERY — PHACOEMULSIFICATION, CATARACT, WITH IOL INSERTION
Anesthesia: Monitor Anesthesia Care | Site: Eye | Laterality: Right

## 2013-03-15 ENCOUNTER — Other Ambulatory Visit (HOSPITAL_COMMUNITY): Payer: PRIVATE HEALTH INSURANCE

## 2013-03-16 ENCOUNTER — Other Ambulatory Visit (HOSPITAL_COMMUNITY): Payer: Self-pay | Admitting: Family Medicine

## 2013-03-16 DIAGNOSIS — R52 Pain, unspecified: Secondary | ICD-10-CM

## 2013-03-16 DIAGNOSIS — Z139 Encounter for screening, unspecified: Secondary | ICD-10-CM

## 2013-03-20 ENCOUNTER — Ambulatory Visit: Admit: 2013-03-20 | Payer: Self-pay | Admitting: Ophthalmology

## 2013-03-20 SURGERY — PHACOEMULSIFICATION, CATARACT, WITH IOL INSERTION
Anesthesia: Monitor Anesthesia Care | Site: Eye | Laterality: Left

## 2013-03-29 ENCOUNTER — Ambulatory Visit (HOSPITAL_COMMUNITY)
Admission: RE | Admit: 2013-03-29 | Discharge: 2013-03-29 | Disposition: A | Discharge status: Home / Self Care | Payer: PRIVATE HEALTH INSURANCE | Source: Ambulatory Visit | Attending: Family Medicine | Admitting: Family Medicine

## 2013-03-29 ENCOUNTER — Other Ambulatory Visit (HOSPITAL_COMMUNITY): Payer: Self-pay | Admitting: Family Medicine

## 2013-03-29 DIAGNOSIS — R52 Pain, unspecified: Secondary | ICD-10-CM

## 2013-03-29 DIAGNOSIS — N644 Mastodynia: Secondary | ICD-10-CM | POA: Insufficient documentation

## 2013-03-29 DIAGNOSIS — J449 Chronic obstructive pulmonary disease, unspecified: Secondary | ICD-10-CM | POA: Insufficient documentation

## 2013-03-29 DIAGNOSIS — R05 Cough: Secondary | ICD-10-CM

## 2013-03-29 DIAGNOSIS — R059 Cough, unspecified: Secondary | ICD-10-CM | POA: Insufficient documentation

## 2013-03-29 DIAGNOSIS — J4489 Other specified chronic obstructive pulmonary disease: Secondary | ICD-10-CM | POA: Insufficient documentation

## 2013-04-25 ENCOUNTER — Encounter (HOSPITAL_COMMUNITY): Payer: Self-pay | Admitting: Pharmacy Technician

## 2013-04-26 ENCOUNTER — Other Ambulatory Visit: Payer: Self-pay

## 2013-04-26 ENCOUNTER — Encounter (HOSPITAL_COMMUNITY)
Admission: RE | Admit: 2013-04-26 | Discharge: 2013-04-26 | Disposition: A | Discharge status: Home / Self Care | Payer: PRIVATE HEALTH INSURANCE | Source: Ambulatory Visit | Attending: Ophthalmology | Admitting: Ophthalmology

## 2013-04-26 ENCOUNTER — Encounter (HOSPITAL_COMMUNITY): Payer: Self-pay

## 2013-04-26 DIAGNOSIS — Z01810 Encounter for preprocedural cardiovascular examination: Secondary | ICD-10-CM | POA: Insufficient documentation

## 2013-04-26 DIAGNOSIS — Z01812 Encounter for preprocedural laboratory examination: Secondary | ICD-10-CM | POA: Insufficient documentation

## 2013-04-26 HISTORY — DX: Depression, unspecified: F32.A

## 2013-04-26 HISTORY — DX: Major depressive disorder, single episode, unspecified: F32.9

## 2013-04-26 HISTORY — DX: Anxiety disorder, unspecified: F41.9

## 2013-04-26 LAB — HEMOGLOBIN AND HEMATOCRIT, BLOOD: Hemoglobin: 14.1 g/dL (ref 12.0–15.0)

## 2013-04-26 LAB — BASIC METABOLIC PANEL
BUN: 14 mg/dL (ref 6–23)
CO2: 30 mEq/L (ref 19–32)
Chloride: 98 mEq/L (ref 96–112)
Creatinine, Ser: 0.52 mg/dL (ref 0.50–1.10)

## 2013-04-26 MED ORDER — DIPHENHYDRAMINE HCL 50 MG/ML IJ SOLN
INTRAMUSCULAR | Status: AC
  Filled 2013-04-26: qty 1

## 2013-04-26 NOTE — Patient Instructions (Addendum)
Your procedure is scheduled on: 05/04/2013  Report to [facility] at  800  AM.  Call this number if you have problems the morning of surgery: [phone]   Do not eat food or drink liquids :After Midnight.      Take these medicines the morning of surgery with A SIP OF WATER: cymbalta, ativan, prilosec. Phenergan, linzess. Take advair before you come.   Do not wear jewelry, make-up or nail polish.  Do not wear lotions, powders, or perfumes.   Do not shave 48 hours prior to surgery.  Do not bring valuables to the hospital.  Contacts, dentures or bridgework may not be worn into surgery.  Leave suitcase in the car. After surgery it may be brought to your room.  For patients admitted to the hospital, checkout time is 11:00 AM the day of discharge.   Patients discharged the day of surgery will not be allowed to drive home.  :     Please read over the following fact sheets that you were given: Coughing and Deep Breathing, Surgical Site Infection Prevention, Anesthesia Post-op Instructions and Care and Recovery After Surgery    Cataract A cataract is a clouding of the lens of the eye. When a lens becomes cloudy, vision is reduced based on the degree and nature of the clouding. Many cataracts reduce vision to some degree. Some cataracts make people more near-sighted as they develop. Other cataracts increase glare. Cataracts that are ignored and become worse can sometimes look white. The white color can be seen through the pupil. CAUSES   Aging. However, cataracts may occur at any age, even in newborns.   Certain drugs.   Trauma to the eye.   Certain diseases such as diabetes.   Specific eye diseases such as chronic inflammation inside the eye or a sudden attack of a rare form of glaucoma.   Inherited or acquired medical problems.  SYMPTOMS   Gradual, progressive drop in vision in the affected eye.   Severe, rapid visual loss. This most often happens when trauma is the cause.  DIAGNOSIS    To detect a cataract, an eye doctor examines the lens. Cataracts are best diagnosed with an exam of the eyes with the pupils enlarged (dilated) by drops.  TREATMENT  For an early cataract, vision may improve by using different eyeglasses or stronger lighting. If that does not help your vision, surgery is the only effective treatment. A cataract needs to be surgically removed when vision loss interferes with your everyday activities, such as driving, reading, or watching TV. A cataract may also have to be removed if it prevents examination or treatment of another eye problem. Surgery removes the cloudy lens and usually replaces it with a substitute lens (intraocular lens, IOL).  At a time when both you and your doctor agree, the cataract will be surgically removed. If you have cataracts in both eyes, only one is usually removed at a time. This allows the operated eye to heal and be out of danger from any possible problems after surgery (such as infection or poor wound healing). In rare cases, a cataract may be doing damage to your eye. In these cases, your caregiver may advise surgical removal right away. The vast majority of people who have cataract surgery have better vision afterward. HOME CARE INSTRUCTIONS  If you are not planning surgery, you may be asked to do the following:  Use different eyeglasses.   Use stronger or brighter lighting.   Ask your eye  doctor about reducing your medicine dose or changing medicines if it is thought that a medicine caused your cataract. Changing medicines does not make the cataract go away on its own.   Become familiar with your surroundings. Poor vision can lead to injury. Avoid bumping into things on the affected side. You are at a higher risk for tripping or falling.   Exercise extreme care when driving or operating machinery.   Wear sunglasses if you are sensitive to bright light or experiencing problems with glare.  SEEK IMMEDIATE MEDICAL CARE IF:   You  have a worsening or sudden vision loss.   You notice redness, swelling, or increasing pain in the eye.   You have a fever.  Document Released: 09/14/2005 Document Revised: 09/03/2011 Document Reviewed: 05/08/2011 [facility] Patient Information 2012 [place].PATIENT INSTRUCTIONS POST-ANESTHESIA  IMMEDIATELY FOLLOWING SURGERY:  Do not drive or operate machinery for the first twenty four hours after surgery.  Do not make any important decisions for twenty four hours after surgery or while taking narcotic pain medications or sedatives.  If you develop intractable nausea and vomiting or a severe headache please notify your doctor immediately.  FOLLOW-UP:  Please make an appointment with your surgeon as instructed. You do not need to follow up with anesthesia unless specifically instructed to do so.  WOUND CARE INSTRUCTIONS (if applicable):  Keep a dry clean dressing on the anesthesia/puncture wound site if there is drainage.  Once the wound has quit draining you may leave it open to air.  Generally you should leave the bandage intact for twenty four hours unless there is drainage.  If the epidural site drains for more than 36-48 hours please call the anesthesia department.  QUESTIONS?:  Please feel free to call your physician or the hospital operator if you have any questions, and they will be happy to assist you.

## 2013-05-01 ENCOUNTER — Encounter (HOSPITAL_COMMUNITY): Payer: Self-pay | Admitting: Pharmacy Technician

## 2013-05-03 MED ORDER — NEOMYCIN-POLYMYXIN-DEXAMETH 3.5-10000-0.1 OP OINT
TOPICAL_OINTMENT | OPHTHALMIC | Status: AC
  Filled 2013-05-03: qty 3.5

## 2013-05-03 MED ORDER — CYCLOPENTOLATE-PHENYLEPHRINE 0.2-1 % OP SOLN
OPHTHALMIC | Status: AC
  Filled 2013-05-03: qty 2

## 2013-05-03 MED ORDER — PHENYLEPHRINE HCL 2.5 % OP SOLN
OPHTHALMIC | Status: AC
  Filled 2013-05-03: qty 2

## 2013-05-03 MED ORDER — LIDOCAINE HCL 3.5 % OP GEL
OPHTHALMIC | Status: AC
  Filled 2013-05-03: qty 5

## 2013-05-03 MED ORDER — TETRACAINE HCL 0.5 % OP SOLN
OPHTHALMIC | Status: AC
  Filled 2013-05-03: qty 2

## 2013-05-03 MED ORDER — LIDOCAINE HCL (PF) 1 % IJ SOLN
INTRAMUSCULAR | Status: AC
  Filled 2013-05-03: qty 2

## 2013-05-04 ENCOUNTER — Ambulatory Visit (HOSPITAL_COMMUNITY)
Admission: RE | Admit: 2013-05-04 | Discharge: 2013-05-04 | Disposition: A | Discharge status: Home / Self Care | Payer: PRIVATE HEALTH INSURANCE | Source: Ambulatory Visit | Attending: Ophthalmology | Admitting: Ophthalmology

## 2013-05-04 ENCOUNTER — Encounter (HOSPITAL_COMMUNITY)
Admission: RE | Disposition: A | Discharge status: Home / Self Care | Payer: Self-pay | Source: Ambulatory Visit | Attending: Ophthalmology

## 2013-05-04 ENCOUNTER — Encounter (HOSPITAL_COMMUNITY): Payer: Self-pay | Admitting: Anesthesiology

## 2013-05-04 ENCOUNTER — Encounter (HOSPITAL_COMMUNITY): Payer: Self-pay | Admitting: Ophthalmology

## 2013-05-04 ENCOUNTER — Ambulatory Visit (HOSPITAL_COMMUNITY): Payer: PRIVATE HEALTH INSURANCE | Admitting: Anesthesiology

## 2013-05-04 DIAGNOSIS — J4489 Other specified chronic obstructive pulmonary disease: Secondary | ICD-10-CM | POA: Insufficient documentation

## 2013-05-04 DIAGNOSIS — H2510 Age-related nuclear cataract, unspecified eye: Secondary | ICD-10-CM | POA: Insufficient documentation

## 2013-05-04 DIAGNOSIS — J449 Chronic obstructive pulmonary disease, unspecified: Secondary | ICD-10-CM | POA: Insufficient documentation

## 2013-05-04 HISTORY — PX: CATARACT EXTRACTION W/PHACO: SHX586

## 2013-05-04 SURGERY — PHACOEMULSIFICATION, CATARACT, WITH IOL INSERTION
Anesthesia: Monitor Anesthesia Care | Site: Eye | Laterality: Right | Wound class: Clean

## 2013-05-04 MED ORDER — CYCLOPENTOLATE-PHENYLEPHRINE 0.2-1 % OP SOLN
1.0000 [drp] | OPHTHALMIC | Status: AC
  Administered 2013-05-04 (×3): 1 [drp] via OPHTHALMIC

## 2013-05-04 MED ORDER — MIDAZOLAM HCL 2 MG/2ML IJ SOLN
INTRAMUSCULAR | Status: AC
  Filled 2013-05-04: qty 2

## 2013-05-04 MED ORDER — EPINEPHRINE HCL 1 MG/ML IJ SOLN
INTRAMUSCULAR | Status: AC
  Filled 2013-05-04: qty 1

## 2013-05-04 MED ORDER — FENTANYL CITRATE 0.05 MG/ML IJ SOLN: 25.0000 ug | INTRAMUSCULAR | Status: DC | PRN

## 2013-05-04 MED ORDER — MIDAZOLAM HCL 2 MG/2ML IJ SOLN
1.0000 mg | INTRAMUSCULAR | Status: DC | PRN
  Administered 2013-05-04: 2 mg via INTRAVENOUS

## 2013-05-04 MED ORDER — LIDOCAINE HCL (PF) 1 % IJ SOLN
INTRAMUSCULAR | Status: DC | PRN
  Administered 2013-05-04: .6 mL

## 2013-05-04 MED ORDER — POVIDONE-IODINE 5 % OP SOLN
OPHTHALMIC | Status: DC | PRN
  Administered 2013-05-04: 1 via OPHTHALMIC

## 2013-05-04 MED ORDER — NEOMYCIN-POLYMYXIN-DEXAMETH 0.1 % OP OINT
TOPICAL_OINTMENT | OPHTHALMIC | Status: DC | PRN
  Administered 2013-05-04: 1 via OPHTHALMIC

## 2013-05-04 MED ORDER — ONDANSETRON HCL 4 MG/2ML IJ SOLN: 4.0000 mg | Freq: Once | INTRAMUSCULAR | Status: DC | PRN

## 2013-05-04 MED ORDER — EPINEPHRINE HCL 1 MG/ML IJ SOLN
INTRAOCULAR | Status: DC | PRN
  Administered 2013-05-04: 10:00:00

## 2013-05-04 MED ORDER — PHENYLEPHRINE HCL 2.5 % OP SOLN
1.0000 [drp] | OPHTHALMIC | Status: AC
  Administered 2013-05-04 (×3): 1 [drp] via OPHTHALMIC

## 2013-05-04 MED ORDER — LACTATED RINGERS IV SOLN
INTRAVENOUS | Status: DC
  Administered 2013-05-04: 09:00:00 via INTRAVENOUS

## 2013-05-04 MED ORDER — PROVISC 10 MG/ML IO SOLN
INTRAOCULAR | Status: DC | PRN
  Administered 2013-05-04: 8.5 mg via INTRAOCULAR

## 2013-05-04 MED ORDER — TETRACAINE HCL 0.5 % OP SOLN
1.0000 [drp] | OPHTHALMIC | Status: AC
  Administered 2013-05-04 (×3): 1 [drp] via OPHTHALMIC

## 2013-05-04 MED ORDER — BSS IO SOLN
INTRAOCULAR | Status: DC | PRN
  Administered 2013-05-04: 15 mL via INTRAOCULAR

## 2013-05-04 MED ORDER — LIDOCAINE HCL 3.5 % OP GEL
1.0000 "application " | Freq: Once | OPHTHALMIC | Status: AC
  Administered 2013-05-04: 1 via OPHTHALMIC

## 2013-05-04 SURGICAL SUPPLY — 32 items
CAPSULAR TENSION RING-AMO (OPHTHALMIC RELATED) IMPLANT
CLOTH BEACON ORANGE TIMEOUT ST (SAFETY) ×1 IMPLANT
EYE SHIELD UNIVERSAL CLEAR (GAUZE/BANDAGES/DRESSINGS) ×2 IMPLANT
GLOVE BIO SURGEON STRL SZ 6.5 (GLOVE) IMPLANT
GLOVE BIOGEL PI IND STRL 6.5 (GLOVE) IMPLANT
GLOVE BIOGEL PI IND STRL 7.0 (GLOVE) ×1 IMPLANT
GLOVE BIOGEL PI IND STRL 7.5 (GLOVE) IMPLANT
GLOVE BIOGEL PI INDICATOR 6.5 (GLOVE)
GLOVE BIOGEL PI INDICATOR 7.0 (GLOVE) ×1
GLOVE BIOGEL PI INDICATOR 7.5 (GLOVE)
GLOVE ECLIPSE 6.5 STRL STRAW (GLOVE) IMPLANT
GLOVE ECLIPSE 7.0 STRL STRAW (GLOVE) IMPLANT
GLOVE ECLIPSE 7.5 STRL STRAW (GLOVE) IMPLANT
GLOVE EXAM NITRILE LRG STRL (GLOVE) IMPLANT
GLOVE EXAM NITRILE MD LF STRL (GLOVE) ×1 IMPLANT
GLOVE SKINSENSE NS SZ6.5 (GLOVE)
GLOVE SKINSENSE NS SZ7.0 (GLOVE)
GLOVE SKINSENSE STRL SZ6.5 (GLOVE) IMPLANT
GLOVE SKINSENSE STRL SZ7.0 (GLOVE) IMPLANT
KIT VITRECTOMY (OPHTHALMIC RELATED) IMPLANT
PAD ARMBOARD 7.5X6 YLW CONV (MISCELLANEOUS) ×2 IMPLANT
PROC W NO LENS (INTRAOCULAR LENS)
PROC W SPEC LENS (INTRAOCULAR LENS)
PROCESS W NO LENS (INTRAOCULAR LENS) IMPLANT
PROCESS W SPEC LENS (INTRAOCULAR LENS) IMPLANT
RING MALYGIN (MISCELLANEOUS) IMPLANT
SIGHTPATH CAT PROC W REG LENS (Ophthalmic Related) ×2 IMPLANT
SYR TB 1ML LL NO SAFETY (SYRINGE) ×1 IMPLANT
TAPE SURG TRANSPORE 1 IN (GAUZE/BANDAGES/DRESSINGS) IMPLANT
TAPE SURGICAL TRANSPORE 1 IN (GAUZE/BANDAGES/DRESSINGS) ×1
VISCOELASTIC ADDITIONAL (OPHTHALMIC RELATED) IMPLANT
WATER STERILE IRR 250ML POUR (IV SOLUTION) ×1 IMPLANT

## 2013-05-04 NOTE — Anesthesia Preprocedure Evaluation (Signed)
Anesthesia Evaluation  Patient identified by MRN, date of birth, ID band Patient awake    Reviewed: Allergy & Precautions, H&P , NPO status , Patient's Chart, lab work & pertinent test results  Airway Mallampati: II      Dental  (+) Edentulous Upper and Edentulous Lower   Pulmonary COPDCurrent Smoker,  + rhonchi         Cardiovascular + CAD + dysrhythmias (palpitations) Rhythm:Regular Rate:Normal     Neuro/Psych PSYCHIATRIC DISORDERS Anxiety Depression CVA, Residual Symptoms    GI/Hepatic GERD-  Medicated and Controlled,Hx pancreatitis    Endo/Other    Renal/GU      Musculoskeletal   Abdominal   Peds  Hematology   Anesthesia Other Findings   Reproductive/Obstetrics                           Anesthesia Physical Anesthesia Plan  ASA: III  Anesthesia Plan: MAC   Post-op Pain Management:    Induction: Intravenous  Airway Management Planned: Nasal Cannula  Additional Equipment:   Intra-op Plan:   Post-operative Plan:   Informed Consent: I have reviewed the patients History and Physical, chart, labs and discussed the procedure including the risks, benefits and alternatives for the proposed anesthesia with the patient or authorized representative who has indicated his/her understanding and acceptance.     Plan Discussed with:   Anesthesia Plan Comments:         Anesthesia Quick Evaluation  

## 2013-05-04 NOTE — Anesthesia Postprocedure Evaluation (Signed)
  Anesthesia Post-op Note  Patient: [name]  Procedure(s) Performed: Procedure(s) with comments: CATARACT EXTRACTION PHACO AND INTRAOCULAR LENS PLACEMENT (IOC) (Right) - CDE:16.17  Patient Location: Short Stay  Anesthesia Type:MAC  Level of Consciousness: awake, alert , oriented and patient cooperative  Airway and Oxygen Therapy: Patient Spontanous Breathing  Post-op Pain: none  Post-op Assessment: Post-op Vital signs reviewed, Patient's Cardiovascular Status Stable, Respiratory Function Stable, Patent Airway and Pain level controlled  Post-op Vital Signs: Reviewed and stable  Complications: No apparent anesthesia complications

## 2013-05-04 NOTE — H&P (Signed)
I have reviewed the H&P, the patient was re-examined, and I have identified no interval changes in medical condition and plan of care since the history and physical of record  

## 2013-05-04 NOTE — Transfer of Care (Signed)
Immediate Anesthesia Transfer of Care Note  Patient: [name]  Procedure(s) Performed: Procedure(s) with comments: CATARACT EXTRACTION PHACO AND INTRAOCULAR LENS PLACEMENT (IOC) (Right) - CDE:16.17  Patient Location: Short Stay  Anesthesia Type:MAC  Level of Consciousness: awake, alert  and patient cooperative  Airway & Oxygen Therapy: Patient Spontanous Breathing  Post-op Assessment: Report given to PACU RN, Post -op Vital signs reviewed and stable and Patient moving all extremities  Post vital signs: Reviewed and stable  Complications: No apparent anesthesia complications

## 2013-05-04 NOTE — Op Note (Signed)
Date of Admission: 05/04/2013  Date of Surgery: 05/04/2013  Pre-Op Dx: Cataract  Right  Eye  Post-Op Dx: Cataract  Right  Eye,  Dx Code 366.16  Surgeon: Gemma Payor, M.D.  Assistants: None  Anesthesia: Topical with MAC  Indications: Painless, progressive loss of vision with compromise of daily activities.  Surgery: Cataract Extraction with Intraocular lens Implant Right Eye  Discription: The patient had dilating drops and viscous lidocaine placed into the left eye in the pre-op holding area. After transfer to the operating room, a time out was performed. The patient was then prepped and draped. Beginning with a 75 degree blade a paracentesis port was made at the surgeon's 2 o'clock position. The anterior chamber was then filled with 1% non-preserved lidocaine. This was followed by filling the anterior chamber with Provisc. A bent cystatome needle was used to create a continuous tear capsulotomy. Hydrodissection was performed with balanced salt solution on a Fine canula. The lens nucleus was then removed using the phacoemulsification handpiece. Residual cortex was removed with the I&A handpiece. The anterior chamber and capsular bag were refilled with Provisc. A posterior chamber intraocular lens was placed into the capsular bag with it's injector. The implant was positioned with the Kuglan hook. The Provisc was then removed from the anterior chamber and capsular bag with the I&A handpiece. Stromal hydration of the main incision and paracentesis port was performed with BSS on a Fine canula. The wounds were tested for leak which was negative. The patient tolerated the procedure well. There were no operative complications. The patient was then transferred to the recovery room in stable condition.  Complications: None  Specimen: None  EBL: None  Prosthetic device: B&L enVista, MX60, power 24.5D, SN [id number].

## 2013-05-05 ENCOUNTER — Encounter (HOSPITAL_COMMUNITY): Payer: Self-pay | Admitting: Ophthalmology

## 2013-05-15 ENCOUNTER — Encounter (HOSPITAL_COMMUNITY): Payer: Self-pay | Admitting: *Deleted

## 2013-05-15 ENCOUNTER — Encounter (HOSPITAL_COMMUNITY)
Admission: RE | Admit: 2013-05-15 | Discharge: 2013-05-15 | Disposition: A | Discharge status: Home / Self Care | Payer: PRIVATE HEALTH INSURANCE | Source: Ambulatory Visit | Attending: Ophthalmology | Admitting: Ophthalmology

## 2013-05-19 MED ORDER — NEOMYCIN-POLYMYXIN-DEXAMETH 3.5-10000-0.1 OP OINT
TOPICAL_OINTMENT | OPHTHALMIC | Status: AC
  Filled 2013-05-19: qty 3.5

## 2013-05-19 MED ORDER — LIDOCAINE HCL (PF) 1 % IJ SOLN
INTRAMUSCULAR | Status: AC
  Filled 2013-05-19: qty 2

## 2013-05-19 MED ORDER — CYCLOPENTOLATE-PHENYLEPHRINE 0.2-1 % OP SOLN
OPHTHALMIC | Status: AC
  Filled 2013-05-19: qty 2

## 2013-05-19 MED ORDER — TETRACAINE HCL 0.5 % OP SOLN
OPHTHALMIC | Status: AC
  Filled 2013-05-19: qty 2

## 2013-05-19 MED ORDER — LIDOCAINE HCL 3.5 % OP GEL
OPHTHALMIC | Status: AC
  Filled 2013-05-19: qty 5

## 2013-05-22 ENCOUNTER — Encounter (HOSPITAL_COMMUNITY): Payer: Self-pay | Admitting: Anesthesiology

## 2013-05-22 ENCOUNTER — Encounter (HOSPITAL_COMMUNITY): Payer: Self-pay | Admitting: *Deleted

## 2013-05-22 ENCOUNTER — Encounter (HOSPITAL_COMMUNITY)
Admission: RE | Disposition: A | Discharge status: Home / Self Care | Payer: Self-pay | Source: Ambulatory Visit | Attending: Ophthalmology

## 2013-05-22 ENCOUNTER — Ambulatory Visit (HOSPITAL_COMMUNITY): Payer: PRIVATE HEALTH INSURANCE | Admitting: Anesthesiology

## 2013-05-22 ENCOUNTER — Ambulatory Visit (HOSPITAL_COMMUNITY)
Admission: RE | Admit: 2013-05-22 | Discharge: 2013-05-22 | Disposition: A | Discharge status: Home / Self Care | Payer: PRIVATE HEALTH INSURANCE | Source: Ambulatory Visit | Attending: Ophthalmology | Admitting: Ophthalmology

## 2013-05-22 DIAGNOSIS — J4489 Other specified chronic obstructive pulmonary disease: Secondary | ICD-10-CM | POA: Insufficient documentation

## 2013-05-22 DIAGNOSIS — H2510 Age-related nuclear cataract, unspecified eye: Secondary | ICD-10-CM | POA: Insufficient documentation

## 2013-05-22 DIAGNOSIS — J449 Chronic obstructive pulmonary disease, unspecified: Secondary | ICD-10-CM | POA: Insufficient documentation

## 2013-05-22 HISTORY — PX: CATARACT EXTRACTION W/PHACO: SHX586

## 2013-05-22 SURGERY — PHACOEMULSIFICATION, CATARACT, WITH IOL INSERTION
Anesthesia: Monitor Anesthesia Care | Site: Eye | Laterality: Left | Wound class: Clean

## 2013-05-22 MED ORDER — MIDAZOLAM HCL 2 MG/2ML IJ SOLN
INTRAMUSCULAR | Status: AC
  Filled 2013-05-22: qty 2

## 2013-05-22 MED ORDER — EPINEPHRINE HCL 1 MG/ML IJ SOLN
INTRAMUSCULAR | Status: AC
  Filled 2013-05-22: qty 1

## 2013-05-22 MED ORDER — LACTATED RINGERS IV SOLN
INTRAVENOUS | Status: DC
  Administered 2013-05-22: 09:00:00 via INTRAVENOUS

## 2013-05-22 MED ORDER — LIDOCAINE HCL (PF) 1 % IJ SOLN
INTRAMUSCULAR | Status: DC | PRN
  Administered 2013-05-22: .4 mL

## 2013-05-22 MED ORDER — LIDOCAINE HCL 3.5 % OP GEL
1.0000 "application " | Freq: Once | OPHTHALMIC | Status: AC
  Administered 2013-05-22: 1 via OPHTHALMIC

## 2013-05-22 MED ORDER — PROVISC 10 MG/ML IO SOLN
INTRAOCULAR | Status: DC | PRN
  Administered 2013-05-22: 8.5 mg via INTRAOCULAR

## 2013-05-22 MED ORDER — TETRACAINE HCL 0.5 % OP SOLN
1.0000 [drp] | OPHTHALMIC | Status: AC
  Administered 2013-05-22 (×3): 1 [drp] via OPHTHALMIC

## 2013-05-22 MED ORDER — NEOMYCIN-POLYMYXIN-DEXAMETH 0.1 % OP OINT
TOPICAL_OINTMENT | OPHTHALMIC | Status: DC | PRN
  Administered 2013-05-22: 1 via OPHTHALMIC

## 2013-05-22 MED ORDER — BSS IO SOLN
INTRAOCULAR | Status: DC | PRN
  Administered 2013-05-22: 15 mL via INTRAOCULAR

## 2013-05-22 MED ORDER — MIDAZOLAM HCL 2 MG/2ML IJ SOLN
1.0000 mg | INTRAMUSCULAR | Status: DC | PRN
  Administered 2013-05-22: 2 mg via INTRAVENOUS

## 2013-05-22 MED ORDER — EPINEPHRINE HCL 1 MG/ML IJ SOLN
INTRAOCULAR | Status: DC | PRN
  Administered 2013-05-22: 10:00:00

## 2013-05-22 MED ORDER — PHENYLEPHRINE HCL 2.5 % OP SOLN
1.0000 [drp] | OPHTHALMIC | Status: AC
  Administered 2013-05-22 (×3): 1 [drp] via OPHTHALMIC

## 2013-05-22 MED ORDER — PHENYLEPHRINE HCL 2.5 % OP SOLN
OPHTHALMIC | Status: AC
  Filled 2013-05-22: qty 2

## 2013-05-22 MED ORDER — POVIDONE-IODINE 5 % OP SOLN
OPHTHALMIC | Status: DC | PRN
  Administered 2013-05-22: 1 via OPHTHALMIC

## 2013-05-22 MED ORDER — LACTATED RINGERS IV SOLN
INTRAVENOUS | Status: DC | PRN
  Administered 2013-05-22: 09:00:00 via INTRAVENOUS

## 2013-05-22 MED ORDER — CYCLOPENTOLATE-PHENYLEPHRINE 0.2-1 % OP SOLN
1.0000 [drp] | OPHTHALMIC | Status: AC
  Administered 2013-05-22 (×3): 1 [drp] via OPHTHALMIC

## 2013-05-22 SURGICAL SUPPLY — 32 items
CAPSULAR TENSION RING-AMO (OPHTHALMIC RELATED) IMPLANT
CLOTH BEACON ORANGE TIMEOUT ST (SAFETY) ×1 IMPLANT
EYE SHIELD UNIVERSAL CLEAR (GAUZE/BANDAGES/DRESSINGS) ×2 IMPLANT
GLOVE BIO SURGEON STRL SZ 6.5 (GLOVE) IMPLANT
GLOVE BIOGEL PI IND STRL 6.5 (GLOVE) IMPLANT
GLOVE BIOGEL PI IND STRL 7.0 (GLOVE) ×1 IMPLANT
GLOVE BIOGEL PI IND STRL 7.5 (GLOVE) IMPLANT
GLOVE BIOGEL PI INDICATOR 6.5 (GLOVE) ×1
GLOVE BIOGEL PI INDICATOR 7.0 (GLOVE) ×1
GLOVE BIOGEL PI INDICATOR 7.5 (GLOVE)
GLOVE ECLIPSE 6.5 STRL STRAW (GLOVE) IMPLANT
GLOVE ECLIPSE 7.0 STRL STRAW (GLOVE) IMPLANT
GLOVE ECLIPSE 7.5 STRL STRAW (GLOVE) IMPLANT
GLOVE EXAM NITRILE LRG STRL (GLOVE) IMPLANT
GLOVE EXAM NITRILE MD LF STRL (GLOVE) IMPLANT
GLOVE SKINSENSE NS SZ6.5 (GLOVE)
GLOVE SKINSENSE NS SZ7.0 (GLOVE)
GLOVE SKINSENSE STRL SZ6.5 (GLOVE) IMPLANT
GLOVE SKINSENSE STRL SZ7.0 (GLOVE) IMPLANT
KIT VITRECTOMY (OPHTHALMIC RELATED) IMPLANT
PAD ARMBOARD 7.5X6 YLW CONV (MISCELLANEOUS) ×2 IMPLANT
PROC W NO LENS (INTRAOCULAR LENS)
PROC W SPEC LENS (INTRAOCULAR LENS)
PROCESS W NO LENS (INTRAOCULAR LENS) IMPLANT
PROCESS W SPEC LENS (INTRAOCULAR LENS) IMPLANT
RING MALYGIN (MISCELLANEOUS) IMPLANT
SIGHTPATH CAT PROC W REG LENS (Ophthalmic Related) ×2 IMPLANT
SYR TB 1ML LL NO SAFETY (SYRINGE) ×2 IMPLANT
TAPE SURG TRANSPORE 1 IN (GAUZE/BANDAGES/DRESSINGS) IMPLANT
TAPE SURGICAL TRANSPORE 1 IN (GAUZE/BANDAGES/DRESSINGS) ×1
VISCOELASTIC ADDITIONAL (OPHTHALMIC RELATED) IMPLANT
WATER STERILE IRR 250ML POUR (IV SOLUTION) ×1 IMPLANT

## 2013-05-22 NOTE — Transfer of Care (Signed)
Immediate Anesthesia Transfer of Care Note  Patient: [name]  Procedure(s) Performed: Procedure(s) with comments: CATARACT EXTRACTION PHACO AND INTRAOCULAR LENS PLACEMENT (IOC) (Left) - CDE:14.75  Patient Location: Short Stay  Anesthesia Type:MAC  Level of Consciousness: awake, alert , oriented and patient cooperative  Airway & Oxygen Therapy: Patient Spontanous Breathing  Post-op Assessment: Report given to PACU RN and Post -op Vital signs reviewed and stable  Post vital signs: Reviewed and stable  Complications: No apparent anesthesia complications

## 2013-05-22 NOTE — Op Note (Signed)
Date of Admission: 05/22/2013  Date of Surgery: 05/22/2013  Pre-Op Dx: Cataract  Left  Eye  Post-Op Dx: Cataract  Left  Eye,  Dx Code 366.16  Surgeon: Gemma Payor, M.D.  Assistants: None  Anesthesia: Topical with MAC  Indications: Painless, progressive loss of vision with compromise of daily activities.  Surgery: Cataract Extraction with Intraocular lens Implant Left Eye  Discription: The patient had dilating drops and viscous lidocaine placed into the left eye in the pre-op holding area. After transfer to the operating room, a time out was performed. The patient was then prepped and draped. Beginning with a 75 degree blade a paracentesis port was made at the surgeon's 2 o'clock position. The anterior chamber was then filled with 1% non-preserved lidocaine. This was followed by filling the anterior chamber with Provisc. A bent cystatome needle was used to create a continuous tear capsulotomy. Hydrodissection was performed with balanced salt solution on a Fine canula. The lens nucleus was then removed using the phacoemulsification handpiece. Residual cortex was removed with the I&A handpiece. The anterior chamber and capsular bag were refilled with Provisc. A posterior chamber intraocular lens was placed into the capsular bag with it's injector. The implant was positioned with the Kuglan hook. The Provisc was then removed from the anterior chamber and capsular bag with the I&A handpiece. Stromal hydration of the main incision and paracentesis port was performed with BSS on a Fine canula. The wounds were tested for leak which was negative. The patient tolerated the procedure well. There were no operative complications. The patient was then transferred to the recovery room in stable condition.  Complications: None  Specimen: None  EBL: None  Prosthetic device: B&L enVista, MX60, power 24.0D, SN [id number].

## 2013-05-22 NOTE — Anesthesia Postprocedure Evaluation (Signed)
  Anesthesia Post-op Note  Patient: [name]  Procedure(s) Performed: Procedure(s) with comments: CATARACT EXTRACTION PHACO AND INTRAOCULAR LENS PLACEMENT (IOC) (Left) - CDE:14.75  Patient Location: Short Stay  Anesthesia Type:MAC  Level of Consciousness: awake, alert , oriented and patient cooperative  Airway and Oxygen Therapy: Patient Spontanous Breathing  Post-op Pain: none  Post-op Assessment: Post-op Vital signs reviewed, Patient's Cardiovascular Status Stable, Respiratory Function Stable, Patent Airway, No signs of Nausea or vomiting and Pain level controlled  Post-op Vital Signs: Reviewed and stable  Complications: No apparent anesthesia complications

## 2013-05-22 NOTE — Preoperative (Signed)
Beta Blockers   Reason not to administer Beta Blockers:Not Applicable 

## 2013-05-22 NOTE — Anesthesia Preprocedure Evaluation (Signed)
Anesthesia Evaluation  Patient identified by MRN, date of birth, ID band Patient awake    Reviewed: Allergy & Precautions, H&P , NPO status , Patient's Chart, lab work & pertinent test results  Airway Mallampati: II      Dental  (+) Edentulous Upper and Edentulous Lower   Pulmonary COPDCurrent Smoker,  + rhonchi         Cardiovascular + CAD + dysrhythmias (palpitations) Rhythm:Regular Rate:Normal     Neuro/Psych PSYCHIATRIC DISORDERS Anxiety Depression CVA, Residual Symptoms    GI/Hepatic GERD-  Medicated and Controlled,Hx pancreatitis    Endo/Other    Renal/GU      Musculoskeletal   Abdominal   Peds  Hematology   Anesthesia Other Findings   Reproductive/Obstetrics                           Anesthesia Physical Anesthesia Plan  ASA: III  Anesthesia Plan: MAC   Post-op Pain Management:    Induction: Intravenous  Airway Management Planned: Nasal Cannula  Additional Equipment:   Intra-op Plan:   Post-operative Plan:   Informed Consent: I have reviewed the patients History and Physical, chart, labs and discussed the procedure including the risks, benefits and alternatives for the proposed anesthesia with the patient or authorized representative who has indicated his/her understanding and acceptance.     Plan Discussed with:   Anesthesia Plan Comments:         Anesthesia Quick Evaluation

## 2013-05-22 NOTE — Anesthesia Procedure Notes (Signed)
Procedure Name: MAC Date/Time: 05/22/2013 9:41 AM Performed by: Carolyne Littles, AMY L Pre-anesthesia Checklist: Patient identified, Timeout performed, Emergency Drugs available, Suction available and Patient being monitored Oxygen Delivery Method: Nasal cannula

## 2013-05-22 NOTE — H&P (Signed)
I have reviewed the H&P, the patient was re-examined, and I have identified no interval changes in medical condition and plan of care since the history and physical of record  

## 2013-05-24 ENCOUNTER — Encounter (HOSPITAL_COMMUNITY): Payer: Self-pay | Admitting: Ophthalmology

## 2013-06-03 ENCOUNTER — Other Ambulatory Visit: Payer: Self-pay | Admitting: Gastroenterology

## 2013-06-08 ENCOUNTER — Other Ambulatory Visit: Payer: Self-pay | Admitting: Gastroenterology

## 2013-09-03 IMAGING — CR DG CHEST 2V
2 series · 2 of 2 positions shown · non-contrast
Comparison: 02/12/2012

CLINICAL DATA: Smoker, COPD

CHEST - 2 VIEW

[view not recorded (1 of 2)]
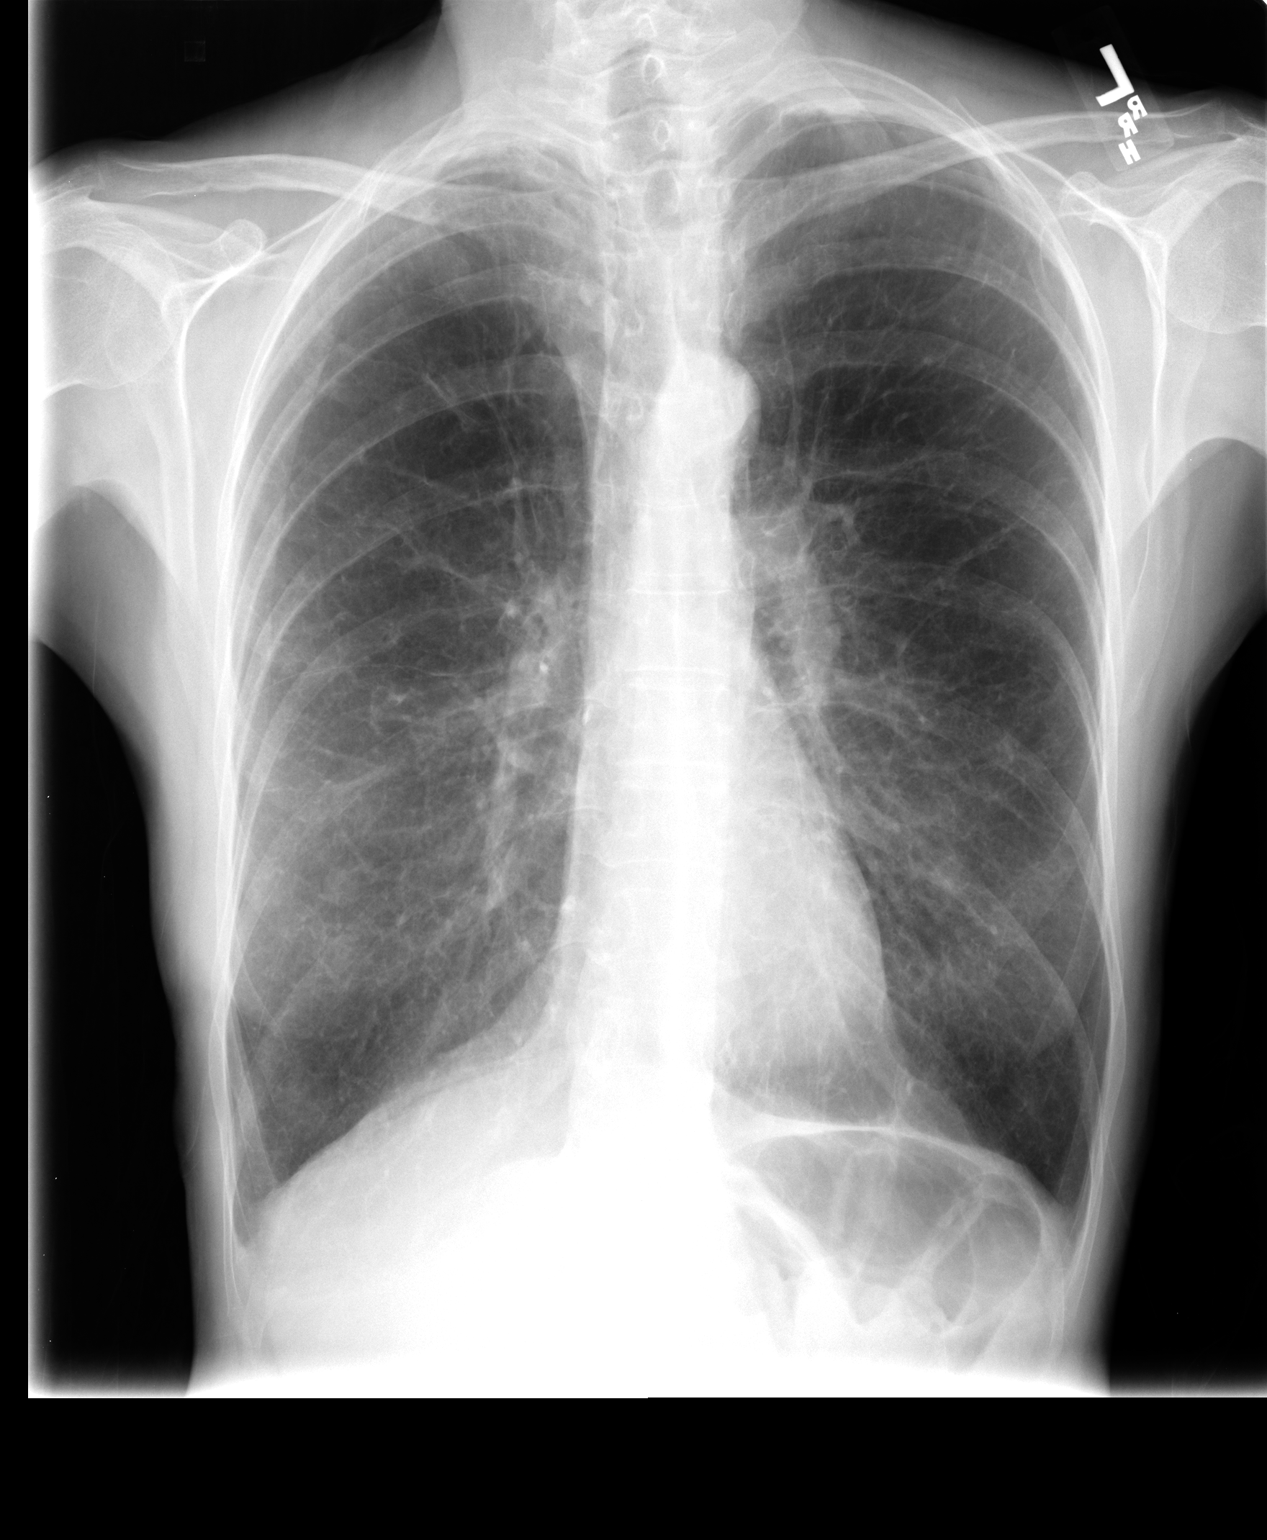

[view not recorded (2 of 2)]
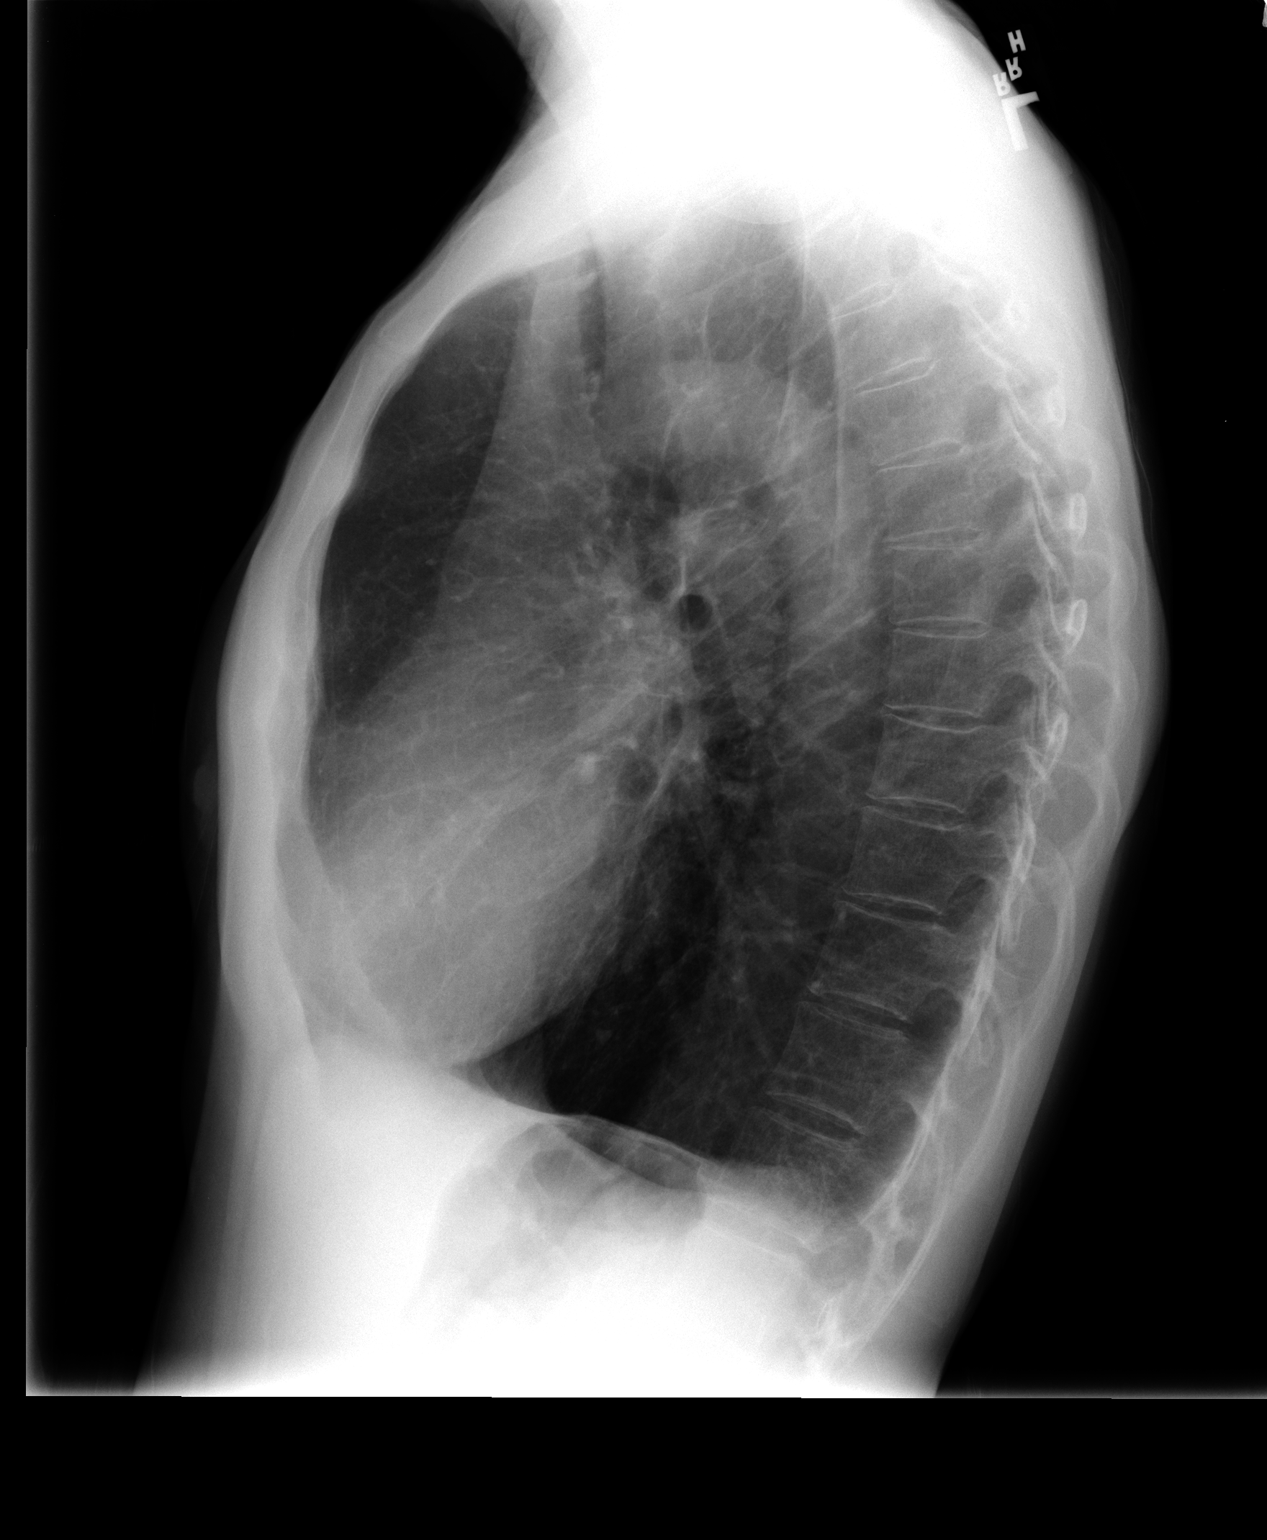

[2 of 2 positions shown; findings below may reference images not displayed]

FINDINGS: Cardiomediastinal silhouette is stable.  Hyperinflation
again noted.  Stable bilateral apical pleural thickening.  Chronic
mild interstitial prominence.  No acute infiltrate or pulmonary
edema.
IMPRESSION: No active disease.  Stable hyperinflation and chronic mild
interstitial prominence.

## 2013-09-28 DIAGNOSIS — D369 Benign neoplasm, unspecified site: Secondary | ICD-10-CM

## 2013-09-28 DIAGNOSIS — A048 Other specified bacterial intestinal infections: Secondary | ICD-10-CM

## 2013-09-28 HISTORY — DX: Other specified bacterial intestinal infections: A04.8

## 2013-09-28 HISTORY — DX: Benign neoplasm, unspecified site: D36.9

## 2013-11-07 ENCOUNTER — Other Ambulatory Visit (HOSPITAL_COMMUNITY): Payer: Self-pay | Admitting: Family Medicine

## 2013-11-07 DIAGNOSIS — Z139 Encounter for screening, unspecified: Secondary | ICD-10-CM

## 2013-12-05 ENCOUNTER — Ambulatory Visit (HOSPITAL_COMMUNITY)
Admission: RE | Admit: 2013-12-05 | Discharge: 2013-12-05 | Disposition: A | Discharge status: Home / Self Care | Payer: PRIVATE HEALTH INSURANCE | Source: Ambulatory Visit | Attending: Family Medicine | Admitting: Family Medicine

## 2013-12-05 DIAGNOSIS — Z139 Encounter for screening, unspecified: Secondary | ICD-10-CM

## 2013-12-05 DIAGNOSIS — Z1231 Encounter for screening mammogram for malignant neoplasm of breast: Secondary | ICD-10-CM | POA: Insufficient documentation

## 2013-12-13 ENCOUNTER — Encounter: Payer: Self-pay | Admitting: Internal Medicine

## 2014-01-10 ENCOUNTER — Other Ambulatory Visit (HOSPITAL_COMMUNITY): Payer: Self-pay | Admitting: Family Medicine

## 2014-01-10 ENCOUNTER — Ambulatory Visit (HOSPITAL_COMMUNITY)
Admission: RE | Admit: 2014-01-10 | Discharge: 2014-01-10 | Disposition: A | Discharge status: Home / Self Care | Payer: PRIVATE HEALTH INSURANCE | Source: Ambulatory Visit | Attending: Family Medicine | Admitting: Family Medicine

## 2014-01-10 DIAGNOSIS — K861 Other chronic pancreatitis: Secondary | ICD-10-CM | POA: Insufficient documentation

## 2014-01-10 DIAGNOSIS — R109 Unspecified abdominal pain: Secondary | ICD-10-CM

## 2014-01-23 ENCOUNTER — Ambulatory Visit (INDEPENDENT_AMBULATORY_CARE_PROVIDER_SITE_OTHER): Payer: PRIVATE HEALTH INSURANCE | Admitting: Internal Medicine

## 2014-01-23 ENCOUNTER — Encounter (INDEPENDENT_AMBULATORY_CARE_PROVIDER_SITE_OTHER): Payer: Self-pay

## 2014-01-23 ENCOUNTER — Encounter: Payer: Self-pay | Admitting: Internal Medicine

## 2014-01-23 VITALS — BP 93/70 | HR 89 | Temp 97.5°F | Ht 65.0 in | Wt 81.2 lb

## 2014-01-23 DIAGNOSIS — G8929 Other chronic pain: Secondary | ICD-10-CM

## 2014-01-23 DIAGNOSIS — K219 Gastro-esophageal reflux disease without esophagitis: Secondary | ICD-10-CM

## 2014-01-23 DIAGNOSIS — R1013 Epigastric pain: Secondary | ICD-10-CM

## 2014-01-23 NOTE — Progress Notes (Signed)
Primary Care Physician:  Lanette Hampshire, MD Primary Gastroenterologist:  Dr. Gala Romney  Pre-Procedure History & Physical: HPI:  [name] is a 68 y.o. female here for setting up a surveillance colonoscopy-history colonic adenomas previously. Chronic constipation. Takes stool softeners and over-the-counter laxatives for one bowel movement to 2 bowel movements weekly. Says Linzess caused abdominal pain. Has chronic right-sided abdominal pain. Ultrasound, recent abdominal CT negative. Scheduled for HIDA earlier this your but has not been done. No dysphagia/Odynophagia. Dr. Leighton Roach would like an EGD done.  Well-documented chronic pancreatitis. Weight stable. No recent nausea or vomiting. Takes Duragesic for chronic back pain. Also takes Fosamax. Reflux symptoms controlled with Prilosec 20 mg daily.  Right-sided abdominal pain waxes and wanes. Not necessarily affected by eating or bowel function.  Past Medical History  Diagnosis Date  . CAD (coronary artery disease)     palpitations, dizziness, chest pain  . Hx of colonic polyps     adenomatous  . Tobacco abuse   . Back pain, chronic   . Cervical cancer   . Allergic rhinitis   . Anxiety and depression   . Nephrolithiasis   . Pancreatitis chronic   . Hematochezia   . GERD (gastroesophageal reflux disease)   . COPD (chronic obstructive pulmonary disease)   . Weight loss     CT negative for occult malignancy, negative celiac, negative adrenal insufficiency  . Renal calculus   . Hyperlipidemia     Lipid profile on 02/25/2011: 209, 113, 55, 132  . Family history of anesthesia complication   . 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 Anxiety   . Depression     Past Surgical History  Procedure Laterality Date  . Partial hysterectomy    . Appendectomy    . Colonoscopy w/ polypectomy  2006    [id number], benign gastric nodule, multiple adenomatous polyps, one with tubular morphology  . Ileocolonoscopy  01/11/2009    RMR:  Polyp at the splenic flexure, status post hot snare removal/ Normal rectum, tubular adenoma  . Esophagogastroduodenoscopy  2006    [id number] gastric nodule  . Eus  2010    Dr. Ardis Hughs : Multiple shadowing calcifications in pancreas, consistent with Chronic Pancreatitis.  These are mainly confined to a collection of calcifications in head/uncinate pancreas. Otherwise the pancreatic parenchyma appears fairly normal.  The main pancreatic duct, CBD are both normal without stones, dilation.    . Cataract extraction w/phaco Right 05/04/2013    Procedure: CATARACT EXTRACTION PHACO AND INTRAOCULAR LENS PLACEMENT (IOC);  Surgeon: Tonny Branch, MD;  Location: [facility];  Service: Ophthalmology;  Laterality: Right;  CDE:16.17  . Cataract extraction w/phaco Left 05/22/2013    Procedure: CATARACT EXTRACTION PHACO AND INTRAOCULAR LENS PLACEMENT (IOC);  Surgeon: Tonny Branch, MD;  Location: [facility];  Service: Ophthalmology;  Laterality: Left;  CDE:14.75    Prior to Admission medications   Medication Sig Start Date End Date Taking? Authorizing Provider  ADVAIR DISKUS 250-50 MCG/DOSE AEPB Inhale 1 puff into the lungs 2 (two) times daily.  10/05/12  Yes Historical Provider, MD  alendronate (FOSAMAX) 70 MG tablet Take 70 mg by mouth every 7 (seven) days. Take with a full glass of water on an empty stomach. Patient take on monday   Yes Historical Provider, MD  Calcium Carbonate (CALCIUM 600 PO) Take 1 tablet by mouth daily.     Yes Historical Provider, MD  cilostazol (PLETAL) 100 MG tablet Take 100 mg by mouth 2 (two) times daily.  12/30/13  Yes Historical Provider, MD  CREON 24000 UNITS CPEP TAKE 2 CAPSULES WITH EACH MEALS AND 1 CAPSULE WITH SNACKS. 06/08/13  Yes Leslie S Lewis, PA-C  docusate sodium (COLACE) 100 MG capsule Take 100 mg by mouth daily.    Yes Historical Provider, MD  DULoxetine (CYMBALTA) 60 MG capsule Take 60 mg by mouth daily.     Yes Historical Provider, MD  fentaNYL (DURAGESIC - DOSED MCG/HR) 75 MCG/HR Place 1  patch onto the skin every 3 (three) days.   Yes Historical Provider, MD  LORazepam (ATIVAN) 0.5 MG tablet Take 0.5 mg by mouth every 4 (four) hours as needed. Take every 4 to 6 hours as needed for nerves   Yes Historical Provider, MD  nitroGLYCERIN (NITROSTAT) 0.4 MG SL tablet Place 1 tablet (0.4 mg total) under the tongue every 5 (five) minutes as needed. 02/17/12  Yes Jenalee Trevizo M Rothbart, MD  omeprazole (PRILOSEC) 20 MG capsule TAKE ONE CAPSULE DAILY. 06/03/13  Yes Leslie S Lewis, PA-C  promethazine (PHENERGAN) 25 MG tablet Take 25 mg by mouth every 6 (six) hours as needed.   Yes Historical Provider, MD  RESTASIS 0.05 % ophthalmic emulsion Place 1 drop into both eyes 2 (two) times daily.  12/30/13  Yes Historical Provider, MD  simvastatin (ZOCOR) 20 MG tablet Take 20 mg by mouth daily.  10/05/12  Yes Historical Provider, MD  Linaclotide (LINZESS) 290 MCG CAPS Take 290 mcg by mouth daily. 08/01/12   Anna W Sams, NP    Allergies as of 01/23/2014 - Review Complete 01/23/2014  Allergen Reaction Noted  . Ibuprofen Anaphylaxis 12/19/2008  . Iohexol Hives and Swelling 05/23/2004    Family History  Problem Relation Age of Onset  . Heart attack Father 70  . Colon cancer Maternal Grandmother     History   Social History  . Marital Status: Divorced    Spouse Name: N/A    Number of Children: 3  . Years of Education: N/A   Occupational History  . disabled    Social History Main Topics  . Smoking status: Current Every Day Smoker -- 1.00 packs/day for 50 years    Types: Cigarettes  . Smokeless tobacco: Never Used  . Alcohol Use: No     Comment: hx of ETOH about 20 years ago.   . Drug Use: No  . Sexual Activity: Yes    Birth Control/ Protection: Surgical   Other Topics Concern  . Not on file   Social History Narrative  . No narrative on file    Review of Systems: See HPI, otherwise negative ROS  Physical Exam: BP 93/70  Pulse 89  Temp(Src) 97.5 F (36.4 C) (Oral)  Ht 5' 5" (1.651 m)   Wt 81 lb 3.2 oz (36.832 kg)  BMI 13.51 kg/m2 General:   chronically ill-appearing lady who appears in no acute distress. Skin:  Intact without significant lesions or rashes. Eyes:  Sclera clear, no icterus.   Conjunctiva pink. Ears:  Normal auditory acuity. Nose:  No deformity, discharge,  or lesions. Mouth:  No deformity or lesions. Neck:  Supple; no masses or thyromegaly. No significant cervical adenopathy. Lungs:  Clear throughout to auscultation.   No wheezes, crackles, or rhonchi. No acute distress. Heart:  Regular rate and rhythm; no murmurs, clicks, rubs,  or gallops. Abdomen: Flat. Positive bowel sounds. She does have right mid abdominal tenderness to palpation. No dermatomal rash. No appreciable mass or organomegaly.  Pulses:  Normal pulses noted. Extremities:  Without   clubbing or edema.  Impression:  Pleasant 67-year-old lady with chronic pancreatitis chronic right sided abdominal pain, history of chronic pancreatitis-well substantiated.  History of colonic adenomas-due for surveillance this time. I doubt Fosamax use has anything to do with her abdominal pain. I doubt radicular or musculoskeletal component as well. Doubt biliary etiology at this time however, HIDA not done. Reflux symptoms well controlled on Prilosec.    Recommendations:     Surveillance colonoscopy and diagnostic EGD at this time.The risks, benefits, limitations, imponderables and alternatives regarding both EGD and colonoscopy have been reviewed with the patient. Questions have been answered. All parties agreeable.  Depending on endoscopic findings, patient may need further evaluation of her gallbladder via nuclear medicine study  

## 2014-01-23 NOTE — Patient Instructions (Signed)
Schedule diagnostic EGD and colonoscopy (abdominal pain and hx of colon polyps)  Continue Prilosec, Creon  Further recommendations to follow

## 2014-01-24 ENCOUNTER — Encounter (HOSPITAL_COMMUNITY): Payer: Self-pay | Admitting: Pharmacy Technician

## 2014-01-26 ENCOUNTER — Encounter (HOSPITAL_COMMUNITY): Payer: Self-pay | Admitting: Emergency Medicine

## 2014-01-26 ENCOUNTER — Emergency Department (HOSPITAL_COMMUNITY): Payer: PRIVATE HEALTH INSURANCE

## 2014-01-26 ENCOUNTER — Emergency Department (HOSPITAL_COMMUNITY)
Admission: EM | Admit: 2014-01-26 | Discharge: 2014-01-26 | Disposition: A | Discharge status: Home / Self Care | Payer: PRIVATE HEALTH INSURANCE | Attending: Emergency Medicine | Admitting: Emergency Medicine

## 2014-01-26 DIAGNOSIS — Z79899 Other long term (current) drug therapy: Secondary | ICD-10-CM | POA: Insufficient documentation

## 2014-01-26 DIAGNOSIS — F329 Major depressive disorder, single episode, unspecified: Secondary | ICD-10-CM | POA: Insufficient documentation

## 2014-01-26 DIAGNOSIS — Y929 Unspecified place or not applicable: Secondary | ICD-10-CM | POA: Insufficient documentation

## 2014-01-26 DIAGNOSIS — M549 Dorsalgia, unspecified: Secondary | ICD-10-CM

## 2014-01-26 DIAGNOSIS — F3289 Other specified depressive episodes: Secondary | ICD-10-CM | POA: Insufficient documentation

## 2014-01-26 DIAGNOSIS — K219 Gastro-esophageal reflux disease without esophagitis: Secondary | ICD-10-CM | POA: Insufficient documentation

## 2014-01-26 DIAGNOSIS — Z860100 Personal history of colon polyps, unspecified: Secondary | ICD-10-CM | POA: Insufficient documentation

## 2014-01-26 DIAGNOSIS — F411 Generalized anxiety disorder: Secondary | ICD-10-CM | POA: Insufficient documentation

## 2014-01-26 DIAGNOSIS — J449 Chronic obstructive pulmonary disease, unspecified: Secondary | ICD-10-CM | POA: Insufficient documentation

## 2014-01-26 DIAGNOSIS — Z87442 Personal history of urinary calculi: Secondary | ICD-10-CM | POA: Insufficient documentation

## 2014-01-26 DIAGNOSIS — Z8673 Personal history of transient ischemic attack (TIA), and cerebral infarction without residual deficits: Secondary | ICD-10-CM | POA: Insufficient documentation

## 2014-01-26 DIAGNOSIS — G8929 Other chronic pain: Secondary | ICD-10-CM | POA: Insufficient documentation

## 2014-01-26 DIAGNOSIS — Y9389 Activity, other specified: Secondary | ICD-10-CM | POA: Insufficient documentation

## 2014-01-26 DIAGNOSIS — Z8541 Personal history of malignant neoplasm of cervix uteri: Secondary | ICD-10-CM | POA: Insufficient documentation

## 2014-01-26 DIAGNOSIS — J4489 Other specified chronic obstructive pulmonary disease: Secondary | ICD-10-CM | POA: Insufficient documentation

## 2014-01-26 DIAGNOSIS — IMO0002 Reserved for concepts with insufficient information to code with codable children: Secondary | ICD-10-CM | POA: Insufficient documentation

## 2014-01-26 DIAGNOSIS — E785 Hyperlipidemia, unspecified: Secondary | ICD-10-CM | POA: Insufficient documentation

## 2014-01-26 DIAGNOSIS — F17200 Nicotine dependence, unspecified, uncomplicated: Secondary | ICD-10-CM | POA: Insufficient documentation

## 2014-01-26 DIAGNOSIS — I2510 Atherosclerotic heart disease of native coronary artery without angina pectoris: Secondary | ICD-10-CM | POA: Insufficient documentation

## 2014-01-26 HISTORY — DX: Other chronic pain: G89.29

## 2014-01-26 HISTORY — DX: Unspecified abdominal pain: R10.9

## 2014-01-26 MED ORDER — HYDROCODONE-ACETAMINOPHEN 5-325 MG PO TABS
1.0000 | ORAL_TABLET | Freq: Once | ORAL | Status: AC
  Administered 2014-01-26: 1 via ORAL
  Filled 2014-01-26: qty 1

## 2014-01-26 MED ORDER — HYDROCODONE-ACETAMINOPHEN 5-325 MG PO TABS: ORAL_TABLET | ORAL | Status: DC

## 2014-01-26 NOTE — Discharge Instructions (Signed)
°Emergency Department Resource Guide °1) Find a Doctor and Pay Out of Pocket °Although you won't have to find out who is covered by your insurance plan, it is a good idea to ask around and get recommendations. You will then need to call the office and see if the doctor you have chosen will accept you as a new patient and what types of options they offer for patients who are self-pay. Some doctors offer discounts or will set up payment plans for their patients who do not have insurance, but you will need to ask so you aren't surprised when you get to your appointment. ° °2) Contact Your Local Health Department °Not all health departments have doctors that can see patients for sick visits, but many do, so it is worth a call to see if yours does. If you don't know where your local health department is, you can check in your phone book. The CDC also has a tool to help you locate your state's health department, and many state websites also have listings of all of their local health departments. ° °3) Find a Walk-in Clinic °If your illness is not likely to be very severe or complicated, you may want to try a walk in clinic. These are popping up all over the country in pharmacies, drugstores, and shopping centers. They're usually staffed by nurse practitioners or physician assistants that have been trained to treat common illnesses and complaints. They're usually fairly quick and inexpensive. However, if you have serious medical issues or chronic medical problems, these are probably not your best option. ° °No Primary Care Doctor: °- Call [facility] at  832-8000 - they can help you locate a primary care doctor that  accepts your insurance, provides certain services, etc. °- Physician Referral Service- 1-800-533-3463 ° °Chronic Pain Problems: °Organization         Address  Phone   Notes  °[facility]  (336) 297-2271 Patients need to be referred by their primary care doctor.  ° °Medication  Assistance: °Organization         Address  Phone   Notes  °[place] [facility] [address] °[place], [place] [address] (336) 641-8030 --Must be a resident of [place] °-- Must have NO insurance coverage whatsoever (no Medicaid/ Medicare, etc.) °-- The pt. MUST have a primary care doctor that directs their care regularly and follows them in the community °  °[facility]  (866) 331-1348   °[employer]  (888) 892-1162   ° °Agencies that provide inexpensive medical care: °Organization         Address  Phone   Notes  °[facility]  (336) 832-8035   ° Internal Medicine    (336) 832-7272   °[facility] [address] °[place],  [address] (336) 832-4777   °[facility] [address], °[place] (336) 271-4999   °[facility]    (336) 373-0678   °[facility]    (336) 272-1050   °[facility] ° [address], [place] Phone:  (336) 832-4444, Fax:  (336) 832-4440 Hours of Operation:  9 am - 6 pm, M-F.  Also accepts Medicaid/Medicare and self-pay.  °[facility] ° [address], [place] Phone: (336) 832-3150, Fax: (336) [phone]. Hours of Operation:  8:30 am - 5:30 pm, M-F.  Also accepts Medicaid and self-pay.  °[facility] [place] [address], [place] Phone: (336) 878-6027   °[facility] [address], [place], [place] (336)723-1848, Ext. 123 Mondays & Thursdays: 7-9 AM.  First 15 patients are seen on a first come, first serve basis. °  ° °Medicaid-accepting [place] Providers: ° °Organization         Address  Phone   Notes  °[facility] [address], [place] (336) 641-2100 Also accepts self-pay patients.  °[facility] [address], [place] ° (336) 856-9996   °[facility] [address], [place]  (336) 288-8857   °[facility] [address] [place] [address], [place] (336) 299-7000   °Veita Bland [address], [place]  ° (336) 373-1557 Only accepts [place] Access Medicaid patients after they have their name applied to their card.  ° °Self-Pay (no insurance) in [place]: ° °Organization         Address  Phone   Notes  °Sickle Cell Patients, [facility] [address], [place] (336) 832-1970   °[facility] [address], [place] (336) 832-4400   °[facility] [place] ° [address], [place] (336) 992-4800   °[facility]/Dr. Osei-Bonsu ° [address] [place] [address], [place] or [address], [place] (336) [phone] Phone number for both [place] and [place] locations is the same.  °[facility] [address], [place] (336) 299-0000   °[facility] [address] [place] [address], [place] or [address] (336) 852-7530 °(336) 878-2260   °[facility] [address], [place] (336) 350-1642, phone; (336) 294-5005, fax Sees patients 1st and 3rd Saturday of every month.  Must not qualify for public or private insurance (i.e. Medicaid, Medicare, [place] Health Choice, Veterans' Benefits) • Household income should be no more than 200% of the poverty level •The clinic cannot treat you if you are pregnant or think you are pregnant • Sexually transmitted diseases are not treated at the clinic.  ° ° °Dental Care: °Organization         Address  Phone  Notes  °[place] [facility] [address], [place] (336) 641-6152 Accepts children up to age 21 who are enrolled in Medicaid or [place] Health Choice; pregnant women with a Medicaid card; and children who have applied for Medicaid or [place] Health Choice, but were declined, whose parents can pay a reduced fee at time of service.  °[place]  [facility] [place]  [address], [place] (336) 641-7733 Accepts children up to age 21 who are enrolled in Medicaid or [place] Health Choice; pregnant women with a Medicaid card; and children who have applied for Medicaid or [place] Health Choice, but were declined, whose parents can pay a reduced fee at time of service.  °[facility] ° [address], [place] (336) 641-4533 Patients are seen by appointment only. Walk-ins are not accepted. [facility] will see patients 18 years of age and older. °Monday - Tuesday (8am-5pm) °Most Wednesdays (8:30-5pm) °$30 per visit, cash only  °[facility] ° [address], [place] (336) 641-4533 Patients are seen by appointment only. Walk-ins are not accepted. [facility] will see patients 18 years of age and older. °One   Wednesday Evening (Monthly: Volunteer Based).  $30 per visit, cash only  °[facility]  (919) 537-3737 for adults; Children under age 4, call Graduate Pediatric Dentistry at ([phone]) [phone]. Children aged 4-14, please call (919) [phone] to request a pediatric application. ° Dental services are provided in all areas of dental care including fillings, crowns and bridges, complete and partial dentures, implants, gum treatment, root canals, and extractions. Preventive care is also provided. Treatment is provided to both adults and children. °Patients are selected via a lottery and there is often a waiting list. °  °[facility] [address], °[place] ° (336) 763-8833 [email or web address] °  °[facility] [address], [place], [place] (336)723-1848, Ext. 123 Second and Fourth Thursday of each month, opens at 6:30 AM; Clinic ends at 9 AM.  Patients are seen on a first-come first-served basis, and a limited number are seen during each clinic.  ° °[facility] ° [address], [place], [place] (336) 723-7904    Eligibility Requirements °You must have lived in [place], [place], or [place] counties for at least the last three months. °  You cannot be eligible for state or federal sponsored healthcare insurance, including Veterans Administration, Medicaid, or Medicare. °  You generally cannot be eligible for healthcare insurance through your employer.  °  How to apply: °Eligibility screenings are held every Tuesday and Wednesday afternoon from 1:00 pm until 4:00 pm. You do not need an appointment for the interview!  °[facility] [address], [place], [place] 336-631-2330   °[facility] [facility]  336-342-8273   °[facility]  336-703-3100   °[facility]  336-570-6415   ° °Behavioral Health Resources in the Community: °Intensive Outpatient Programs °Organization         Address  Phone  Notes  °[facility] [address], [place], [place] 336-878-6098   °[facility] [address], [place], [place] 336-832-9800   °[facility] [address], [place], [place] ° 336-882-2125   °[facility] [address],  °[place], [place] 1-800-853-5163 or 336-641-4981   °Substance Abuse Resources °Organization         Address  Phone  Notes  °[facility]  336-882-2125   °[facility]  336-784-9470   °[facility]  336-285-9073   °[facility]  336-845-3988   °Residential & Outpatient Substance Abuse Program  1-800-659-3381   °Psychological Services °Organization         Address  Phone  Notes  °[facility]  336- 832-9600   °[facility]  336- 378-7881   °[facility] [address], [place] 1-800-853-5163 or 336-641-4981   ° °Mobile Crisis Teams °Organization         Address  Phone  Notes  °[facility], Mobile Crisis Care Unit  1-877-626-1772   °Assertive °[facility] ° [address]  [place], [place] 336-834-9664   °Sharon DeEsch [address] °[place] 336-554-5454   ° °Self-Help/Support Groups °Organization         Address  Phone             Notes  °[facility]  - variety of support groups  336- 373-1402 Call for more information  °Narcotics Anonymous (NA), [facility] [address], °[place]  2 meetings at this location  ° °  Residential Treatment Programs °Organization         Address  Phone  Notes  °[facility] [address],    °[place]  1-866-801-8205   °[facility] ° [address], [place], [place] 704-293-8524   °[facility] [address], [place] 336-845-3988 Admissions: 8am-3pm M-F  °[facility] [address]    °[place], [place] 336-841-1104   °[facility] [address], [place], [place] 336-379-7146   °[facility] [address]  °[place], [place] 336-285-9073   °[facility] Intensive Outpatient [address], [place], [place] 336-852-3033   °[facility] [address]  °[place], [place] 1-877-615-2722 or 336-784-9470   °[facility] [address] [place], [place] 336-227-7417 Accepts Medicaid  °[facility] [address]  ° [place] 1-800-659-3381 Substance Abuse/Addiction Treatment  ° °[place] Behavioral Health Resources °Organization         Address  Phone  Notes  °[facility]  (888) 581-9988   °Julie Brannon, PhD [address] [place], [place]   (336) 349-5553 or (336) 951-0000   °[facility]   [address] °[place], [place] (336) 349-4454   °[facility] [address], [place], [place] (336) 342-8316 Insurance/Medicaid/sponsorship through [facility]  °[facility] [address]                                    [place], [place] (336) 342-8316 Therapy/tele-psych/case    °[facility] [address]  ° [place],  (336) 349-2233    °Dr. Arfeen  (336) 349-4544   °[facility] [place]  [employer] [place] [facility] 1) [address], [place] °2) [address], [place] °3)  [address], [place] (336) 349-3220 °(336) 342-7768 ° °(336) 342-8140   °[place] [facility] (336) 342-1394 or (336) 342-3537 (After Hours)    ° ° °Take the prescription as directed.  Apply moist heat or ice to the area(s) of discomfort, for 15 minutes at a time, several times per day for the next few days.  Do not fall asleep on a heating or ice pack.  Call your regular medical doctor on Monday to schedule a follow up appointment in the next 3 days.  Return to the Emergency Department immediately if worsening. ° °

## 2014-01-26 NOTE — ED Provider Notes (Signed)
CSN: [id number]     Arrival date & time 01/26/14  1943 History   First MD Initiated Contact with Patient 01/26/14 2008     Chief Complaint  Patient presents with  . Back Pain      HPI Pt was seen at 2005. Per pt, c/o gradual onset and persistence of constant acute flair of her chronic low back "pain" for the past several days. States the driver's door of her car is "broken" so she has been crawling across the center console into the passenger seat to get in and out of her car. States she "heard a pop" before the pain began. Denies any change in her usual chronic pain pattern.  Pain worsens with palpation of the area and body position changes. Denies incont/retention of bowel or bladder, no saddle anesthesia, no focal motor weakness, no tingling/numbness in extremities, no fevers, no injury, no abd pain.   The symptoms have been associated with no other complaints. The patient has a significant history of similar symptoms previously, recently being evaluated for this complaint and multiple prior evals for same.     Past Medical History  Diagnosis Date  . CAD (coronary artery disease)     palpitations, dizziness, chest pain  . Hx of colonic polyps     adenomatous  . Tobacco abuse   . Back pain, chronic   . Allergic rhinitis   . Anxiety and depression   . Pancreatitis chronic   . Hematochezia   . GERD (gastroesophageal reflux disease)   . COPD (chronic obstructive pulmonary disease)   . Weight loss     CT negative for occult malignancy, negative celiac, negative adrenal insufficiency  . Hyperlipidemia     Lipid profile on 02/25/2011: 209, 113, 55, 132  . Family history of anesthesia complication   . 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 Anxiety   . Depression   . Chronic abdominal pain   . Cervical cancer   . Nephrolithiasis   . Renal calculus    Past Surgical History  Procedure Laterality Date  . Partial hysterectomy    . Appendectomy    . Colonoscopy w/ polypectomy   2006    KDT:[id number], benign gastric nodule, multiple adenomatous polyps, one with tubular morphology  . Ileocolonoscopy  01/11/2009    RMR: Polyp at the splenic flexure, status post hot snare removal/ Normal rectum, tubular adenoma  . Esophagogastroduodenoscopy  2006    IPJ:ASNKNL gastric nodule  . Eus  2010    Dr. Ardis Hughs : Multiple shadowing calcifications in pancreas, consistent with Chronic Pancreatitis.  These are mainly confined to a collection of calcifications in head/uncinate pancreas. Otherwise the pancreatic parenchyma appears fairly normal.  The main pancreatic duct, CBD are both normal without stones, dilation.    . Cataract extraction w/phaco Right 05/04/2013    Procedure: CATARACT EXTRACTION PHACO AND INTRAOCULAR LENS PLACEMENT (IOC);  Surgeon: Tonny Branch, MD;  Location: [facility];  Service: Ophthalmology;  Laterality: Right;  CDE:16.17  . Cataract extraction w/phaco Left 05/22/2013    Procedure: CATARACT EXTRACTION PHACO AND INTRAOCULAR LENS PLACEMENT (IOC);  Surgeon: Tonny Branch, MD;  Location: [facility];  Service: Ophthalmology;  Laterality: Left;  CDE:14.75  . Abdominal hysterectomy     Family History  Problem Relation Age of Onset  . Heart attack Father 55  . Colon cancer Maternal Grandmother    History  Substance Use Topics  . Smoking status: Current Every Day Smoker -- 1.00 packs/day for  50 years    Types: Cigarettes  . Smokeless tobacco: Never Used  . Alcohol Use: No     Comment: hx of ETOH about 20 years ago.     Review of Systems ROS: Statement: All systems negative except as marked or noted in the HPI; Constitutional: Negative for fever and chills. ; ; Eyes: Negative for eye pain, redness and discharge. ; ; ENMT: Negative for ear pain, hoarseness, nasal congestion, sinus pressure and sore throat. ; ; Cardiovascular: Negative for chest pain, palpitations, diaphoresis, dyspnea and peripheral edema. ; ; Respiratory: Negative for cough, wheezing and stridor. ; ;  Gastrointestinal: Negative for nausea, vomiting, diarrhea, abdominal pain, blood in stool, hematemesis, jaundice and rectal bleeding. . ; ; Genitourinary: Negative for dysuria, flank pain and hematuria. ; ; Musculoskeletal: +LBP. Negative for neck pain. Negative for swelling and trauma.; ; Skin: Negative for pruritus, rash, abrasions, blisters, bruising and skin lesion.; ; Neuro: Negative for headache, lightheadedness and neck stiffness. Negative for weakness, altered level of consciousness , altered mental status, extremity weakness, paresthesias, involuntary movement, seizure and syncope.     Allergies  Ibuprofen and Iohexol  Home Medications   Prior to Admission medications   Medication Sig Start Date End Date Taking? Authorizing Provider  acetaminophen (TYLENOL) 325 MG tablet Take 650 mg by mouth every 6 (six) hours as needed for mild pain.    Historical Provider, MD  ADVAIR DISKUS 250-50 MCG/DOSE AEPB Inhale 1 puff into the lungs 2 (two) times daily.  10/05/12   Historical Provider, MD  alendronate (FOSAMAX) 70 MG tablet Take 70 mg by mouth every 7 (seven) days. Take with a full glass of water on an empty stomach. Patient take on monday    Historical Provider, MD  Calcium Carbonate (CALCIUM 600 PO) Take 1 tablet by mouth daily.      Historical Provider, MD  cilostazol (PLETAL) 100 MG tablet Take 100 mg by mouth 2 (two) times daily.  12/30/13   Historical Provider, MD  DULoxetine (CYMBALTA) 60 MG capsule Take 60 mg by mouth daily.      Historical Provider, MD  fentaNYL (DURAGESIC - DOSED MCG/HR) 75 MCG/HR Place 1 patch onto the skin every 3 (three) days.    Historical Provider, MD  Linaclotide Rolan Lipa) 290 MCG CAPS capsule Take 290 mcg by mouth daily.    Historical Provider, MD  LORazepam (ATIVAN) 1 MG tablet Take 1 mg by mouth every 8 (eight) hours as needed for anxiety.    Historical Provider, MD  nitroGLYCERIN (NITROSTAT) 0.4 MG SL tablet Place 1 tablet (0.4 mg total) under the tongue every 5  (five) minutes as needed. 02/17/12   Yehuda Savannah, MD  omeprazole (PRILOSEC) 20 MG capsule Take 20 mg by mouth daily.    Historical Provider, MD  Pancrelipase, Lip-Prot-Amyl, (CREON) 24000 UNITS CPEP Take 24,000-48,000 Units by mouth 4 (four) times daily -  with meals and at bedtime.    Historical Provider, MD  promethazine (PHENERGAN) 25 MG tablet Take 25 mg by mouth every 6 (six) hours as needed for nausea or vomiting.     Historical Provider, MD  RESTASIS 0.05 % ophthalmic emulsion Place 1 drop into both eyes 2 (two) times daily.  12/30/13   Historical Provider, MD  simvastatin (ZOCOR) 20 MG tablet Take 20 mg by mouth daily.  10/05/12   Historical Provider, MD   BP 102/69  Pulse 86  Temp(Src) 98.3 F (36.8 C) (Oral)  Resp 20  Ht 5\' 5"  (1.651 m)  Wt 80 lb (36.288 kg)  BMI 13.31 kg/m2  SpO2 95% Physical Exam 2010: Physical examination:  Nursing notes reviewed; Vital signs and O2 SAT reviewed;  Constitutional: Thin, frail, Well hydrated, In no acute distress; Head:  Normocephalic, atraumatic; Eyes: EOMI, PERRL, No scleral icterus; ENMT: Mouth and pharynx normal, Mucous membranes moist; Neck: Supple, Full range of motion, No lymphadenopathy; Cardiovascular: Regular rate and rhythm, No gallop; Respiratory: Breath sounds clear & equal bilaterally, No rales, rhonchi, wheezes.  Speaking full sentences with ease, Normal respiratory effort/excursion; Chest: Nontender, Movement normal; Abdomen: Soft, Nontender, Nondistended, Normal bowel sounds; Genitourinary: No CVA tenderness; Spine:  No midline CS, TS, LS tenderness. +TTP bilat lumbar paraspinal muscles.;; Extremities: Pulses normal, No tenderness, No edema, No calf edema or asymmetry.; Neuro: AA&Ox3, Major CN grossly intact.  Speech clear. Strength 5/5 equal bilat UE's and LE's, including great toe dorsiflexion.  DTR 2/4 equal bilat UE's and LE's.  No gross sensory deficits.  Neg straight leg raises bilat. Climbs on and off stretcher easily by herself.  Gait steady..; Skin: Color normal, Warm, Dry.   ED Course  Procedures    EKG Interpretation None      MDM  MDM Reviewed: previous chart, nursing note and vitals Reviewed previous: MRI, CT scan and x-ray Interpretation: x-ray    Dg Lumbar Spine Complete 01/26/2014   CLINICAL DATA:  Low back pain following an injury.  EXAM: LUMBAR SPINE - COMPLETE 4+ VIEW  COMPARISON:  Abdomen and pelvis CT dated 01/10/2014.  FINDINGS: Five non-rib-bearing lumbar vertebrae. These have normal appearances with no fractures, pars defect or subluxations. Atheromatous arterial calcifications.  IMPRESSION: No acute abnormality.   Electronically Signed   By: Enrique Sack M.D.   On: 01/26/2014 20:40    2045:  VS per baseline. XR reassuring. No red flags on H&P. Will tx symptomatically at this time. Pt talking easily on her cellphone, NAD. Feels better and wants to go home now. Dx and testing d/w pt.  Questions answered.  Verb understanding, agreeable to d/c home with outpt f/u.   Alfonzo Feller, DO 01/29/14 1146

## 2014-01-26 NOTE — ED Notes (Signed)
Pain, low back, says she could not get out of driver 's side of car and had to crawl across to passenger door.  Heard a pop in her back .and has hurt since then

## 2014-02-08 ENCOUNTER — Ambulatory Visit (HOSPITAL_COMMUNITY)
Admission: RE | Admit: 2014-02-08 | Discharge: 2014-02-08 | Disposition: A | Discharge status: Home / Self Care | Payer: PRIVATE HEALTH INSURANCE | Source: Ambulatory Visit | Attending: Internal Medicine | Admitting: Internal Medicine

## 2014-02-08 ENCOUNTER — Encounter (HOSPITAL_COMMUNITY): Payer: Self-pay | Admitting: *Deleted

## 2014-02-08 ENCOUNTER — Encounter (HOSPITAL_COMMUNITY)
Admission: RE | Disposition: A | Discharge status: Home / Self Care | Payer: Self-pay | Source: Ambulatory Visit | Attending: Internal Medicine

## 2014-02-08 DIAGNOSIS — F329 Major depressive disorder, single episode, unspecified: Secondary | ICD-10-CM | POA: Insufficient documentation

## 2014-02-08 DIAGNOSIS — K2970 Gastritis, unspecified, without bleeding: Secondary | ICD-10-CM

## 2014-02-08 DIAGNOSIS — K319 Disease of stomach and duodenum, unspecified: Secondary | ICD-10-CM | POA: Diagnosis not present

## 2014-02-08 DIAGNOSIS — F3289 Other specified depressive episodes: Secondary | ICD-10-CM | POA: Insufficient documentation

## 2014-02-08 DIAGNOSIS — J449 Chronic obstructive pulmonary disease, unspecified: Secondary | ICD-10-CM | POA: Insufficient documentation

## 2014-02-08 DIAGNOSIS — F411 Generalized anxiety disorder: Secondary | ICD-10-CM | POA: Insufficient documentation

## 2014-02-08 DIAGNOSIS — R109 Unspecified abdominal pain: Secondary | ICD-10-CM | POA: Insufficient documentation

## 2014-02-08 DIAGNOSIS — K2990 Gastroduodenitis, unspecified, without bleeding: Secondary | ICD-10-CM | POA: Diagnosis not present

## 2014-02-08 DIAGNOSIS — I2510 Atherosclerotic heart disease of native coronary artery without angina pectoris: Secondary | ICD-10-CM | POA: Insufficient documentation

## 2014-02-08 DIAGNOSIS — J4489 Other specified chronic obstructive pulmonary disease: Secondary | ICD-10-CM | POA: Insufficient documentation

## 2014-02-08 DIAGNOSIS — K219 Gastro-esophageal reflux disease without esophagitis: Secondary | ICD-10-CM | POA: Insufficient documentation

## 2014-02-08 DIAGNOSIS — K3189 Other diseases of stomach and duodenum: Secondary | ICD-10-CM

## 2014-02-08 DIAGNOSIS — R1013 Epigastric pain: Secondary | ICD-10-CM

## 2014-02-08 DIAGNOSIS — D126 Benign neoplasm of colon, unspecified: Secondary | ICD-10-CM | POA: Insufficient documentation

## 2014-02-08 DIAGNOSIS — Z8541 Personal history of malignant neoplasm of cervix uteri: Secondary | ICD-10-CM | POA: Insufficient documentation

## 2014-02-08 DIAGNOSIS — Z86010 Personal history of colonic polyps: Secondary | ICD-10-CM

## 2014-02-08 DIAGNOSIS — R197 Diarrhea, unspecified: Secondary | ICD-10-CM | POA: Insufficient documentation

## 2014-02-08 DIAGNOSIS — Z1211 Encounter for screening for malignant neoplasm of colon: Secondary | ICD-10-CM | POA: Insufficient documentation

## 2014-02-08 DIAGNOSIS — G8929 Other chronic pain: Secondary | ICD-10-CM | POA: Diagnosis not present

## 2014-02-08 DIAGNOSIS — E785 Hyperlipidemia, unspecified: Secondary | ICD-10-CM | POA: Insufficient documentation

## 2014-02-08 DIAGNOSIS — F17200 Nicotine dependence, unspecified, uncomplicated: Secondary | ICD-10-CM | POA: Insufficient documentation

## 2014-02-08 HISTORY — PX: COLONOSCOPY: SHX5424

## 2014-02-08 HISTORY — PX: ESOPHAGOGASTRODUODENOSCOPY: SHX5428

## 2014-02-08 LAB — CLOSTRIDIUM DIFFICILE BY PCR: CDIFFPCR: NEGATIVE

## 2014-02-08 SURGERY — COLONOSCOPY
Anesthesia: Moderate Sedation

## 2014-02-08 MED ORDER — STERILE WATER FOR IRRIGATION IR SOLN
Status: DC | PRN
  Administered 2014-02-08: 10:00:00

## 2014-02-08 MED ORDER — MIDAZOLAM HCL 5 MG/5ML IJ SOLN
INTRAMUSCULAR | Status: DC | PRN
  Administered 2014-02-08 (×2): 1 mg via INTRAVENOUS
  Administered 2014-02-08 (×2): 2 mg via INTRAVENOUS
  Administered 2014-02-08: 1 mg via INTRAVENOUS

## 2014-02-08 MED ORDER — MEPERIDINE HCL 100 MG/ML IJ SOLN
INTRAMUSCULAR | Status: AC
  Filled 2014-02-08: qty 2

## 2014-02-08 MED ORDER — MIDAZOLAM HCL 5 MG/5ML IJ SOLN
INTRAMUSCULAR | Status: AC
  Filled 2014-02-08: qty 10

## 2014-02-08 MED ORDER — ONDANSETRON HCL 4 MG/2ML IJ SOLN
INTRAMUSCULAR | Status: AC
  Filled 2014-02-08: qty 2

## 2014-02-08 MED ORDER — MEPERIDINE HCL 100 MG/ML IJ SOLN
INTRAMUSCULAR | Status: DC | PRN
  Administered 2014-02-08: 50 mg via INTRAVENOUS
  Administered 2014-02-08 (×2): 25 mg via INTRAVENOUS

## 2014-02-08 MED ORDER — LIDOCAINE VISCOUS 2 % MT SOLN
OROMUCOSAL | Status: DC | PRN
  Administered 2014-02-08: 3 mL via OROMUCOSAL

## 2014-02-08 MED ORDER — LIDOCAINE VISCOUS 2 % MT SOLN
OROMUCOSAL | Status: AC
  Filled 2014-02-08: qty 15

## 2014-02-08 MED ORDER — SODIUM CHLORIDE 0.9 % IV SOLN
INTRAVENOUS | Status: DC
  Administered 2014-02-08: 10:00:00 via INTRAVENOUS

## 2014-02-08 MED ORDER — ONDANSETRON HCL 4 MG/2ML IJ SOLN
INTRAMUSCULAR | Status: DC | PRN
  Administered 2014-02-08: 4 mg via INTRAVENOUS

## 2014-02-08 NOTE — Op Note (Signed)
[facility] [address] [place], [address]   ENDOSCOPY PROCEDURE REPORT  PATIENT: [name], [name]  MR#: [id number] BIRTHDATE: [birth year] , 67  yrs. old GENDER: Female ENDOSCOPIST: R.  Garfield Cornea, MD FACP FACG REFERRED BY:  Marjean Donna, M.D. PROCEDURE DATE:  02/08/2014 PROCEDURE:     EGD with gastric biopsy  INDICATIONS:     right sided abdominal pain  INFORMED CONSENT:   The risks, benefits, limitations, alternatives and imponderables have been discussed.  The potential for biopsy, esophogeal dilation, etc. have also been reviewed.  Questions have been answered.  All parties agreeable.  Please see the history and physical in the medical record for more information.  MEDICATIONS:  Versed 5 mg IV and Demerol 75 mg IV in divided doses. Xylocaine gel orally. Zofran 4 mg IV  DESCRIPTION OF PROCEDURE:   The HE-1740C ([id number])  endoscope was introduced through the mouth and advanced to the second portion of the duodenum without difficulty or limitations.  The mucosal surfaces were surveyed very carefully during advancement of the scope and upon withdrawal.  Retroflexion view of the proximal stomach and esophagogastric junction was performed.      FINDINGS:   Normal esophagus. Stomach empty. Diffuse mottling and submucosal petechiae of the gastric mucosa. (1) 8 mm somewhat some mucosal appearing nodule on the posterior antral wall; no ulcer or infiltrating process. Patent pylorus. Normal first and second portion of the duodenum.  THERAPEUTIC / DIAGNOSTIC MANEUVERS PERFORMED:  Biopsies abnormal he was taken. Biopsies of the gastric nodule taken separately.   COMPLICATIONS:  None  IMPRESSION:  Abnormal stomach and gastric nodule of uncertain significance-status post biopsy  RECOMMENDATIONS:  Followup on pathology. See colonoscopy report.    _______________________________ R. Garfield Cornea, MD FACP [facility] eSigned:  R. Garfield Cornea, MD FACP [facility]  02/08/2014 10:22 AM     CC:

## 2014-02-08 NOTE — Op Note (Signed)
[facility] [address] [place], [address]   COLONOSCOPY PROCEDURE REPORT  PATIENT: [name], [name]  MR#:         [id number] BIRTHDATE: [birth year] , 67  yrs. old GENDER: Female ENDOSCOPIST: R.  Garfield Cornea, MD FACP FACG REFERRED BY:  Marjean Donna, M.D. PROCEDURE DATE:  02/08/2014 PROCEDURE:     ileocolonoscopy with biopsy and snare polypectomy and stool sampling  INDICATIONS: History of colonic adenoma; surveillance examination. Of note, also watery diarrhea 5 days prior to beginning the prep for this procedure.  INFORMED CONSENT:  The risks, benefits, alternatives and imponderables including but not limited to bleeding, perforation as well as the possibility of a missed lesion have been reviewed.  The potential for biopsy, lesion removal, etc. have also been discussed.  Questions have been answered.  All parties agreeable. Please see the history and physical in the medical record for more information.  MEDICATIONS: Versed 7 mg IV and Demerol 100 mg IV in divided doses. Zofran 4 mg IV  DESCRIPTION OF PROCEDURE:  After a digital rectal exam was performed, the EC-[id number] ([id number])  colonoscope was advanced from the anus through the rectum and colon to the area of the cecum, ileocecal valve and appendiceal orifice.  The cecum was deeply intubated.  These structures were well-seen and photographed for the record.  From the level of the cecum and ileocecal valve, the scope was slowly and cautiously withdrawn.  The mucosal surfaces were carefully surveyed utilizing scope tip deflection to facilitate fold flattening as needed.  The scope was pulled down into the rectum where a thorough examination including retroflexion was performed.    FINDINGS:  Adequate preparation. Normal rectum. (1) 7 mm sessile polyp in the base of the cecum and (1) diminutive polyp in the mid ascending segment; otherwise, the remainder of the colonic mucosa appeared normal. The  distal 5 cm of terminal ileal mucosa also appeared normal.  THERAPEUTIC / DIAGNOSTIC MANEUVERS PERFORMED:  The above-mentioned polyps were hot snared and cold biopsy removed, respectively. Also, stool samples for C. difficile toxin assay  COMPLICATIONS: None  CECAL WITHDRAWAL TIME:  8 minutes  IMPRESSION:  Colonic polyps-removed as described above.  RECOMMENDATIONS: Followup on pathology, stool studies. See EGD report patient.  May need a HIDA scan to complete her GI evaluation for right-sided abdominal pain.   _______________________________ eSigned:  R. Garfield Cornea, MD FACP [facility] 02/08/2014 10:47 AM   CC:    PATIENT NAME:  [name], [name] MR#: [id number]

## 2014-02-08 NOTE — Discharge Instructions (Signed)
Colonoscopy Discharge Instructions  Read the instructions outlined below and refer to this sheet in the next few weeks. These discharge instructions provide you with general information on caring for yourself after you leave the hospital. Your doctor may also give you specific instructions. While your treatment has been planned according to the most current medical practices available, unavoidable complications occasionally occur. If you have any problems or questions after discharge, call Dr. Gala Romney at [phone]. ACTIVITY  You may resume your regular activity, but move at a slower pace for the next 24 hours.   Take frequent rest periods for the next 24 hours.   Walking will help get rid of the air and reduce the bloated feeling in your belly (abdomen).   No driving for 24 hours (because of the medicine (anesthesia) used during the test).    Do not sign any important legal documents or operate any machinery for 24 hours (because of the anesthesia used during the test).  NUTRITION  Drink plenty of fluids.   You may resume your normal diet as instructed by your doctor.   Begin with a light meal and progress to your normal diet. Heavy or fried foods are harder to digest and may make you feel sick to your stomach (nauseated).   Avoid alcoholic beverages for 24 hours or as instructed.  MEDICATIONS  You may resume your normal medications unless your doctor tells you otherwise.  WHAT YOU CAN EXPECT TODAY  Some feelings of bloating in the abdomen.   Passage of more gas than usual.   Spotting of blood in your stool or on the toilet paper.  IF YOU HAD POLYPS REMOVED DURING THE COLONOSCOPY:  No aspirin products for 7 days or as instructed.   No alcohol for 7 days or as instructed.   Eat a soft diet for the next 24 hours.  FINDING OUT THE RESULTS OF YOUR TEST Not all test results are available during your visit. If your test results are not back during the visit, make an appointment  with your caregiver to find out the results. Do not assume everything is normal if you have not heard from your caregiver or the medical facility. It is important for you to follow up on all of your test results.  SEEK IMMEDIATE MEDICAL ATTENTION IF:  You have more than a spotting of blood in your stool.   Your belly is swollen (abdominal distention).   You are nauseated or vomiting.   You have a temperature over 101.   You have abdominal pain or discomfort that is severe or gets worse throughout the day.   EGD Discharge instructions Please read the instructions outlined below and refer to this sheet in the next few weeks. These discharge instructions provide you with general information on caring for yourself after you leave the hospital. Your doctor may also give you specific instructions. While your treatment has been planned according to the most current medical practices available, unavoidable complications occasionally occur. If you have any problems or questions after discharge, please call your doctor. ACTIVITY  You may resume your regular activity but move at a slower pace for the next 24 hours.   Take frequent rest periods for the next 24 hours.   Walking will help expel (get rid of) the air and reduce the bloated feeling in your abdomen.   No driving for 24 hours (because of the anesthesia (medicine) used during the test).   You may shower.   Do not sign any  important legal documents or operate any machinery for 24 hours (because of the anesthesia used during the test).  NUTRITION  Drink plenty of fluids.   You may resume your normal diet.   Begin with a light meal and progress to your normal diet.   Avoid alcoholic beverages for 24 hours or as instructed by your caregiver.  MEDICATIONS  You may resume your normal medications unless your caregiver tells you otherwise.  WHAT YOU CAN EXPECT TODAY  You may experience abdominal discomfort such as a feeling of  fullness or gas pains.  FOLLOW-UP  Your doctor will discuss the results of your test with you.  SEEK IMMEDIATE MEDICAL ATTENTION IF ANY OF THE FOLLOWING OCCUR:  Excessive nausea (feeling sick to your stomach) and/or vomiting.   Severe abdominal pain and distention (swelling).   Trouble swallowing.   Temperature over 101 F (37.8 C).   Rectal bleeding or vomiting of blood.     Further recommendations to follow pending review of pathology report and stool studies  You may need a gallbladder function test in the near future.

## 2014-02-08 NOTE — Interval H&P Note (Signed)
History and Physical Interval Note:  02/08/2014 9:53 AM  [name]  has presented today for surgery, with the diagnosis of ABDOMINAL PAIN, HISTORY OF COLON POLYPS  The various methods of treatment have been discussed with the patient and family. After consideration of risks, benefits and other options for treatment, the patient has consented to  Procedure(s) with comments: COLONOSCOPY (N/A) - 10:00-moved to [place] to notify pt ESOPHAGOGASTRODUODENOSCOPY (EGD) (N/A) as a surgical intervention .  The patient's history has been reviewed, patient examined, no change in status, stable for surgery.  I have reviewed the patient's chart and labs.  Questions were answered to the patient's satisfaction.     [name]  diarrhea for 5 days prior to prep ( usually constipated). No antibiotic exposure. EGD and colonoscopy per plan.

## 2014-02-08 NOTE — H&P (View-Only) (Signed)
Primary Care Physician:  Lanette Hampshire, MD Primary Gastroenterologist:  Dr. Gala Romney  Pre-Procedure History & Physical: HPI:  [name] is a 68 y.o. female here for setting up a surveillance colonoscopy-history colonic adenomas previously. Chronic constipation. Takes stool softeners and over-the-counter laxatives for one bowel movement to 2 bowel movements weekly. Says Linzess caused abdominal pain. Has chronic right-sided abdominal pain. Ultrasound, recent abdominal CT negative. Scheduled for HIDA earlier this your but has not been done. No dysphagia/Odynophagia. Dr. Leighton Roach would like an EGD done.  Well-documented chronic pancreatitis. Weight stable. No recent nausea or vomiting. Takes Duragesic for chronic back pain. Also takes Fosamax. Reflux symptoms controlled with Prilosec 20 mg daily.  Right-sided abdominal pain waxes and wanes. Not necessarily affected by eating or bowel function.  Past Medical History  Diagnosis Date  . CAD (coronary artery disease)     palpitations, dizziness, chest pain  . Hx of colonic polyps     adenomatous  . Tobacco abuse   . Back pain, chronic   . Cervical cancer   . Allergic rhinitis   . Anxiety and depression   . Nephrolithiasis   . Pancreatitis chronic   . Hematochezia   . GERD (gastroesophageal reflux disease)   . COPD (chronic obstructive pulmonary disease)   . Weight loss     CT negative for occult malignancy, negative celiac, negative adrenal insufficiency  . Renal calculus   . Hyperlipidemia     Lipid profile on 02/25/2011: 209, 113, 55, 132  . Family history of anesthesia complication   . 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 Anxiety   . Depression     Past Surgical History  Procedure Laterality Date  . Partial hysterectomy    . Appendectomy    . Colonoscopy w/ polypectomy  2006    [id number], benign gastric nodule, multiple adenomatous polyps, one with tubular morphology  . Ileocolonoscopy  01/11/2009    RMR:  Polyp at the splenic flexure, status post hot snare removal/ Normal rectum, tubular adenoma  . Esophagogastroduodenoscopy  2006    [id number] gastric nodule  . Eus  2010    Dr. Ardis Hughs : Multiple shadowing calcifications in pancreas, consistent with Chronic Pancreatitis.  These are mainly confined to a collection of calcifications in head/uncinate pancreas. Otherwise the pancreatic parenchyma appears fairly normal.  The main pancreatic duct, CBD are both normal without stones, dilation.    . Cataract extraction w/phaco Right 05/04/2013    Procedure: CATARACT EXTRACTION PHACO AND INTRAOCULAR LENS PLACEMENT (IOC);  Surgeon: Tonny Branch, MD;  Location: [facility];  Service: Ophthalmology;  Laterality: Right;  CDE:16.17  . Cataract extraction w/phaco Left 05/22/2013    Procedure: CATARACT EXTRACTION PHACO AND INTRAOCULAR LENS PLACEMENT (IOC);  Surgeon: Tonny Branch, MD;  Location: [facility];  Service: Ophthalmology;  Laterality: Left;  CDE:14.75    Prior to Admission medications   Medication Sig Start Date End Date Taking? Authorizing Provider  ADVAIR DISKUS 250-50 MCG/DOSE AEPB Inhale 1 puff into the lungs 2 (two) times daily.  10/05/12  Yes Historical Provider, MD  alendronate (FOSAMAX) 70 MG tablet Take 70 mg by mouth every 7 (seven) days. Take with a full glass of water on an empty stomach. Patient take on monday   Yes Historical Provider, MD  Calcium Carbonate (CALCIUM 600 PO) Take 1 tablet by mouth daily.     Yes Historical Provider, MD  cilostazol (PLETAL) 100 MG tablet Take 100 mg by mouth 2 (two) times daily.  12/30/13  Yes Historical Provider, MD  CREON 24000 UNITS CPEP TAKE 2 CAPSULES WITH EACH MEALS AND 1 CAPSULE WITH SNACKS. 06/08/13  Yes Mahala Menghini, PA-C  docusate sodium (COLACE) 100 MG capsule Take 100 mg by mouth daily.    Yes Historical Provider, MD  DULoxetine (CYMBALTA) 60 MG capsule Take 60 mg by mouth daily.     Yes Historical Provider, MD  fentaNYL (DURAGESIC - DOSED MCG/HR) 75 MCG/HR Place 1  patch onto the skin every 3 (three) days.   Yes Historical Provider, MD  LORazepam (ATIVAN) 0.5 MG tablet Take 0.5 mg by mouth every 4 (four) hours as needed. Take every 4 to 6 hours as needed for nerves   Yes Historical Provider, MD  nitroGLYCERIN (NITROSTAT) 0.4 MG SL tablet Place 1 tablet (0.4 mg total) under the tongue every 5 (five) minutes as needed. 02/17/12  Yes Yehuda Savannah, MD  omeprazole (PRILOSEC) 20 MG capsule TAKE ONE CAPSULE DAILY. 06/03/13  Yes Mahala Menghini, PA-C  promethazine (PHENERGAN) 25 MG tablet Take 25 mg by mouth every 6 (six) hours as needed.   Yes Historical Provider, MD  RESTASIS 0.05 % ophthalmic emulsion Place 1 drop into both eyes 2 (two) times daily.  12/30/13  Yes Historical Provider, MD  simvastatin (ZOCOR) 20 MG tablet Take 20 mg by mouth daily.  10/05/12  Yes Historical Provider, MD  Linaclotide (LINZESS) 290 MCG CAPS Take 290 mcg by mouth daily. 08/01/12   Orvil Feil, NP    Allergies as of 01/23/2014 - Review Complete 01/23/2014  Allergen Reaction Noted  . Ibuprofen Anaphylaxis 12/19/2008  . Iohexol Hives and Swelling 05/23/2004    Family History  Problem Relation Age of Onset  . Heart attack Father 64  . Colon cancer Maternal Grandmother     History   Social History  . Marital Status: Divorced    Spouse Name: N/A    Number of Children: 3  . Years of Education: N/A   Occupational History  . disabled    Social History Main Topics  . Smoking status: Current Every Day Smoker -- 1.00 packs/day for 50 years    Types: Cigarettes  . Smokeless tobacco: Never Used  . Alcohol Use: No     Comment: hx of ETOH about 20 years ago.   . Drug Use: No  . Sexual Activity: Yes    Birth Control/ Protection: Surgical   Other Topics Concern  . Not on file   Social History Narrative  . No narrative on file    Review of Systems: See HPI, otherwise negative ROS  Physical Exam: BP 93/70  Pulse 89  Temp(Src) 97.5 F (36.4 C) (Oral)  Ht 5\' 5"  (1.651 m)   Wt 81 lb 3.2 oz (36.832 kg)  BMI 13.51 kg/m2 General:   chronically ill-appearing lady who appears in no acute distress. Skin:  Intact without significant lesions or rashes. Eyes:  Sclera clear, no icterus.   Conjunctiva pink. Ears:  Normal auditory acuity. Nose:  No deformity, discharge,  or lesions. Mouth:  No deformity or lesions. Neck:  Supple; no masses or thyromegaly. No significant cervical adenopathy. Lungs:  Clear throughout to auscultation.   No wheezes, crackles, or rhonchi. No acute distress. Heart:  Regular rate and rhythm; no murmurs, clicks, rubs,  or gallops. Abdomen: Flat. Positive bowel sounds. She does have right mid abdominal tenderness to palpation. No dermatomal rash. No appreciable mass or organomegaly.  Pulses:  Normal pulses noted. Extremities:  Without  clubbing or edema.  Impression:  Pleasant 68 year old lady with chronic pancreatitis chronic right sided abdominal pain, history of chronic pancreatitis-well substantiated.  History of colonic adenomas-due for surveillance this time. I doubt Fosamax use has anything to do with her abdominal pain. I doubt radicular or musculoskeletal component as well. Doubt biliary etiology at this time however, HIDA not done. Reflux symptoms well controlled on Prilosec.    Recommendations:     Surveillance colonoscopy and diagnostic EGD at this time.The risks, benefits, limitations, imponderables and alternatives regarding both EGD and colonoscopy have been reviewed with the patient. Questions have been answered. All parties agreeable.  Depending on endoscopic findings, patient may need further evaluation of her gallbladder via nuclear medicine study

## 2014-02-09 ENCOUNTER — Encounter (HOSPITAL_COMMUNITY): Payer: Self-pay | Admitting: Internal Medicine

## 2014-02-10 ENCOUNTER — Encounter: Payer: Self-pay | Admitting: Internal Medicine

## 2014-02-12 ENCOUNTER — Telehealth: Payer: Self-pay

## 2014-02-12 NOTE — Telephone Encounter (Signed)
Tried to call pt- LMOM 

## 2014-02-12 NOTE — Telephone Encounter (Signed)
Letter from: Daneil Dolin  Reason for Letter: Results Review  prevpak or gen equivalent x 14 days; hold current PPI and then resume

## 2014-02-14 NOTE — Telephone Encounter (Signed)
Pt aware of results and Rx called in [place]

## 2014-02-14 NOTE — Telephone Encounter (Signed)
Pt called wanting to get her results. Please advise  

## 2014-03-09 ENCOUNTER — Encounter (HOSPITAL_COMMUNITY): Payer: Self-pay | Admitting: Emergency Medicine

## 2014-03-09 ENCOUNTER — Emergency Department (HOSPITAL_COMMUNITY): Payer: PRIVATE HEALTH INSURANCE

## 2014-03-09 ENCOUNTER — Emergency Department (HOSPITAL_COMMUNITY)
Admission: EM | Admit: 2014-03-09 | Discharge: 2014-03-10 | Disposition: A | Discharge status: Home / Self Care | Payer: PRIVATE HEALTH INSURANCE | Attending: Emergency Medicine | Admitting: Emergency Medicine

## 2014-03-09 DIAGNOSIS — G8929 Other chronic pain: Secondary | ICD-10-CM | POA: Insufficient documentation

## 2014-03-09 DIAGNOSIS — J029 Acute pharyngitis, unspecified: Secondary | ICD-10-CM | POA: Insufficient documentation

## 2014-03-09 DIAGNOSIS — K219 Gastro-esophageal reflux disease without esophagitis: Secondary | ICD-10-CM | POA: Insufficient documentation

## 2014-03-09 DIAGNOSIS — F17200 Nicotine dependence, unspecified, uncomplicated: Secondary | ICD-10-CM | POA: Insufficient documentation

## 2014-03-09 DIAGNOSIS — I2510 Atherosclerotic heart disease of native coronary artery without angina pectoris: Secondary | ICD-10-CM | POA: Insufficient documentation

## 2014-03-09 DIAGNOSIS — Z860100 Personal history of colon polyps, unspecified: Secondary | ICD-10-CM | POA: Insufficient documentation

## 2014-03-09 DIAGNOSIS — F411 Generalized anxiety disorder: Secondary | ICD-10-CM | POA: Insufficient documentation

## 2014-03-09 DIAGNOSIS — F3289 Other specified depressive episodes: Secondary | ICD-10-CM | POA: Insufficient documentation

## 2014-03-09 DIAGNOSIS — J449 Chronic obstructive pulmonary disease, unspecified: Secondary | ICD-10-CM | POA: Insufficient documentation

## 2014-03-09 DIAGNOSIS — Z8541 Personal history of malignant neoplasm of cervix uteri: Secondary | ICD-10-CM | POA: Insufficient documentation

## 2014-03-09 DIAGNOSIS — R109 Unspecified abdominal pain: Secondary | ICD-10-CM

## 2014-03-09 DIAGNOSIS — F329 Major depressive disorder, single episode, unspecified: Secondary | ICD-10-CM | POA: Insufficient documentation

## 2014-03-09 DIAGNOSIS — Z8673 Personal history of transient ischemic attack (TIA), and cerebral infarction without residual deficits: Secondary | ICD-10-CM | POA: Insufficient documentation

## 2014-03-09 DIAGNOSIS — Z79899 Other long term (current) drug therapy: Secondary | ICD-10-CM | POA: Insufficient documentation

## 2014-03-09 DIAGNOSIS — J4489 Other specified chronic obstructive pulmonary disease: Secondary | ICD-10-CM | POA: Insufficient documentation

## 2014-03-09 DIAGNOSIS — Z87442 Personal history of urinary calculi: Secondary | ICD-10-CM | POA: Insufficient documentation

## 2014-03-09 DIAGNOSIS — E876 Hypokalemia: Secondary | ICD-10-CM | POA: Insufficient documentation

## 2014-03-09 DIAGNOSIS — M549 Dorsalgia, unspecified: Secondary | ICD-10-CM | POA: Insufficient documentation

## 2014-03-09 LAB — CBC WITH DIFFERENTIAL/PLATELET
BASOS PCT: 0 % (ref 0–1)
Basophils Absolute: 0 10*3/uL (ref 0.0–0.1)
EOS PCT: 1 % (ref 0–5)
Eosinophils Absolute: 0.1 10*3/uL (ref 0.0–0.7)
HEMATOCRIT: 39.5 % (ref 36.0–46.0)
HEMOGLOBIN: 13.8 g/dL (ref 12.0–15.0)
Lymphocytes Relative: 17 % (ref 12–46)
Lymphs Abs: 2.1 10*3/uL (ref 0.7–4.0)
MCH: 31.5 pg (ref 26.0–34.0)
MCHC: 34.9 g/dL (ref 30.0–36.0)
MCV: 90.2 fL (ref 78.0–100.0)
MONO ABS: 1.3 10*3/uL — AB (ref 0.1–1.0)
MONOS PCT: 11 % (ref 3–12)
Neutro Abs: 8.9 10*3/uL — ABNORMAL HIGH (ref 1.7–7.7)
Neutrophils Relative %: 71 % (ref 43–77)
Platelets: 331 10*3/uL (ref 150–400)
RBC: 4.38 MIL/uL (ref 3.87–5.11)
RDW: 13.3 % (ref 11.5–15.5)
WBC: 12.5 10*3/uL — ABNORMAL HIGH (ref 4.0–10.5)

## 2014-03-09 LAB — COMPREHENSIVE METABOLIC PANEL
ALBUMIN: 3.1 g/dL — AB (ref 3.5–5.2)
ALT: 30 U/L (ref 0–35)
AST: 21 U/L (ref 0–37)
Alkaline Phosphatase: 120 U/L — ABNORMAL HIGH (ref 39–117)
BILIRUBIN TOTAL: 0.4 mg/dL (ref 0.3–1.2)
BUN: 9 mg/dL (ref 6–23)
CALCIUM: 8.5 mg/dL (ref 8.4–10.5)
CHLORIDE: 96 meq/L (ref 96–112)
CO2: 29 mEq/L (ref 19–32)
CREATININE: 0.42 mg/dL — AB (ref 0.50–1.10)
GFR calc Af Amer: >90 mL/min (ref >90)
GFR calc non Af Amer: >90 mL/min (ref >90)
Glucose, Bld: 108 mg/dL — ABNORMAL HIGH (ref 70–99)
Potassium: 3 mEq/L — ABNORMAL LOW (ref 3.7–5.3)
Sodium: 140 mEq/L (ref 137–147)
Total Protein: 7.1 g/dL (ref 6.0–8.3)

## 2014-03-09 LAB — LIPASE, BLOOD: Lipase: 24 U/L (ref 11–59)

## 2014-03-09 LAB — LACTIC ACID, PLASMA: LACTIC ACID, VENOUS: 2.6 mmol/L — AB (ref 0.5–2.2)

## 2014-03-09 MED ORDER — SODIUM CHLORIDE 0.9 % IV SOLN
INTRAVENOUS | Status: DC
  Administered 2014-03-09: 22:00:00 via INTRAVENOUS

## 2014-03-09 MED ORDER — ONDANSETRON HCL 4 MG/2ML IJ SOLN
4.0000 mg | Freq: Once | INTRAMUSCULAR | Status: AC
  Administered 2014-03-09: 4 mg via INTRAVENOUS
  Filled 2014-03-09: qty 2

## 2014-03-09 MED ORDER — HYDROMORPHONE HCL PF 1 MG/ML IJ SOLN
1.0000 mg | Freq: Once | INTRAMUSCULAR | Status: AC
  Administered 2014-03-09: 1 mg via INTRAVENOUS
  Filled 2014-03-09: qty 1

## 2014-03-09 NOTE — ED Provider Notes (Signed)
CSN: [id number]     Arrival date & time 03/09/14  2108 History  This chart was scribed for Candace Greek, MD by Ladene Artist, ED Scribe. The patient was seen in room APA09/APA09. Patient's care was started at 9:26 PM.   Chief Complaint  Patient presents with  . Abdominal Pain   The history is provided by the patient. No language interpreter was used.   HPI Comments: DEZIYAH [name] is a 68 y.o. female who presents to the Emergency Department complaining of gradually worsening R flank pain that radiates to R back. She reports associated malodor, dark urine, sore throat. She denies dysuria, urinary frequency, decreased urine. Pt has a h/o kidney stones. She denies similar pain. Pt states that she was prescribed 3 antibiotics 02/08/14 following a colonoscopy. Pt finished antibiotics 4 days ago.    Past Medical History  Diagnosis Date  . CAD (coronary artery disease)     palpitations, dizziness, chest pain  . Hx of colonic polyps     adenomatous  . Tobacco abuse   . Back pain, chronic   . Allergic rhinitis   . Anxiety and depression   . Pancreatitis chronic   . Hematochezia   . GERD (gastroesophageal reflux disease)   . COPD (chronic obstructive pulmonary disease)   . Weight loss     CT negative for occult malignancy, negative celiac, negative adrenal insufficiency  . Hyperlipidemia     Lipid profile on 02/25/2011: 209, 113, 55, 132  . Family history of anesthesia complication   . 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 Anxiety   . Depression   . Chronic abdominal pain   . Cervical cancer   . Nephrolithiasis   . Renal calculus    Past Surgical History  Procedure Laterality Date  . Partial hysterectomy    . Appendectomy    . Colonoscopy w/ polypectomy  2006    [id number], benign gastric nodule, multiple adenomatous polyps, one with tubular morphology  . Ileocolonoscopy  01/11/2009    RMR: Polyp at the splenic flexure, status post hot snare removal/ Normal  rectum, tubular adenoma  . Esophagogastroduodenoscopy  2006    [id number] gastric nodule  . Eus  2010    Dr. Ardis Hughs : Multiple shadowing calcifications in pancreas, consistent with Chronic Pancreatitis.  These are mainly confined to a collection of calcifications in head/uncinate pancreas. Otherwise the pancreatic parenchyma appears fairly normal.  The main pancreatic duct, CBD are both normal without stones, dilation.    . Cataract extraction w/phaco Right 05/04/2013    Procedure: CATARACT EXTRACTION PHACO AND INTRAOCULAR LENS PLACEMENT (IOC);  Surgeon: Tonny Branch, MD;  Location: [facility];  Service: Ophthalmology;  Laterality: Right;  CDE:16.17  . Cataract extraction w/phaco Left 05/22/2013    Procedure: CATARACT EXTRACTION PHACO AND INTRAOCULAR LENS PLACEMENT (IOC);  Surgeon: Tonny Branch, MD;  Location: [facility];  Service: Ophthalmology;  Laterality: Left;  CDE:14.75  . Abdominal hysterectomy    . Colonoscopy N/A 02/08/2014    Procedure: COLONOSCOPY;  Surgeon: Daneil Dolin, MD;  Location: [facility];  Service: Endoscopy;  Laterality: N/A;  10:00-moved to 1030 [name] to notify pt  . Esophagogastroduodenoscopy N/A 02/08/2014    Procedure: ESOPHAGOGASTRODUODENOSCOPY (EGD);  Surgeon: Daneil Dolin, MD;  Location: [facility];  Service: Endoscopy;  Laterality: N/A;   Family History  Problem Relation Age of Onset  . Heart attack Father 19  . Colon cancer Maternal Grandmother  History  Substance Use Topics  . Smoking status: Current Every Day Smoker -- 1.00 packs/day for 50 years    Types: Cigarettes  . Smokeless tobacco: Never Used  . Alcohol Use: No     Comment: hx of ETOH about 20 years ago.    OB History   Grav Para Term Preterm Abortions TAB SAB Ect Mult Living                 Review of Systems  HENT: Positive for sore throat.   Gastrointestinal: Positive for abdominal pain.  Genitourinary: Positive for flank pain. Negative for dysuria, frequency and decreased urine volume.   All other systems reviewed and are negative.  Allergies  Ibuprofen and Iohexol  Home Medications   Prior to Admission medications   Medication Sig Start Date End Date Taking? Authorizing Provider  acetaminophen (TYLENOL) 325 MG tablet Take 650 mg by mouth every 6 (six) hours as needed for mild pain.   Yes Historical Provider, MD  ADVAIR DISKUS 250-50 MCG/DOSE AEPB Inhale 1 puff into the lungs 2 (two) times daily.  10/05/12  Yes Historical Provider, MD  alendronate (FOSAMAX) 70 MG tablet Take 70 mg by mouth every 7 (seven) days. Take with a full glass of water on an empty stomach. Patient take on monday   Yes Historical Provider, MD  Calcium Carbonate (CALCIUM 600 PO) Take 1 tablet by mouth daily.     Yes Historical Provider, MD  cilostazol (PLETAL) 100 MG tablet Take 100 mg by mouth 2 (two) times daily.  12/30/13  Yes Historical Provider, MD  DULoxetine (CYMBALTA) 60 MG capsule Take 60 mg by mouth daily.     Yes Historical Provider, MD  fentaNYL (DURAGESIC - DOSED MCG/HR) 75 MCG/HR Place 1 patch onto the skin every 3 (three) days.   Yes Historical Provider, MD  HYDROcodone-acetaminophen (NORCO/VICODIN) 5-325 MG per tablet Take 1 tablet by mouth daily as needed for moderate pain (taken for breakthrough pain).   Yes Historical Provider, MD  LORazepam (ATIVAN) 1 MG tablet Take 1 mg by mouth every 8 (eight) hours as needed for anxiety.   Yes Historical Provider, MD  nitroGLYCERIN (NITROSTAT) 0.4 MG SL tablet Place 1 tablet (0.4 mg total) under the tongue every 5 (five) minutes as needed. 02/17/12  Yes Candace Savannah, MD  omeprazole (PRILOSEC) 20 MG capsule Take 20 mg by mouth daily.   Yes Historical Provider, MD  Pancrelipase, Lip-Prot-Amyl, (CREON) 24000 UNITS CPEP Take 24,000 Units by mouth 4 (four) times daily -  with meals and at bedtime.    Yes Historical Provider, MD  promethazine (PHENERGAN) 25 MG tablet Take 25 mg by mouth every 6 (six) hours as needed for nausea or vomiting.    Yes  Historical Provider, MD  RESTASIS 0.05 % ophthalmic emulsion Place 1 drop into both eyes 2 (two) times daily.  12/30/13  Yes Historical Provider, MD  simvastatin (ZOCOR) 20 MG tablet Take 20 mg by mouth every morning.  10/05/12  Yes Historical Provider, MD  amoxicillin (AMOXIL) 500 MG capsule Take 500 mg by mouth 4 (four) times daily. 14 day supply starting on 02/14/2014 02/14/14   Historical Provider, MD  clarithromycin (BIAXIN) 500 MG tablet Take 500 mg by mouth 2 (two) times daily. 14 day course starting on 02/14/2014 02/14/14   Historical Provider, MD  lansoprazole (PREVACID) 30 MG capsule Take 1 capsule by mouth 2 (two) times daily. 14 day course starting on 02/14/2014 02/14/14   Historical Provider, MD  Triage Vitals: BP 141/74  Pulse 101  Temp(Src) 97.8 F (36.6 C) (Oral)  Resp 20  Ht 5\' 5"  (1.651 m)  Wt 81 lb (36.741 kg)  BMI 13.48 kg/m2  SpO2 96% Physical Exam  Constitutional: She is oriented to person, place, and time. She appears well-developed and well-nourished. No distress.  HENT:  Head: Normocephalic and atraumatic.  Right Ear: Hearing normal.  Left Ear: Hearing normal.  Nose: Nose normal.  Mouth/Throat: Oropharynx is clear and moist and mucous membranes are normal.  Eyes: Conjunctivae and EOM are normal. Pupils are equal, round, and reactive to light.  Neck: Normal range of motion. Neck supple.  Cardiovascular: Normal rate, regular rhythm, S1 normal and S2 normal.  Exam reveals no gallop and no friction rub.   No murmur heard. Pulmonary/Chest: Effort normal and breath sounds normal. No respiratory distress. She exhibits no tenderness.  Abdominal: Soft. Normal appearance and bowel sounds are normal. There is no hepatosplenomegaly. There is tenderness. There is no rebound, no guarding, no tenderness at McBurney's point and negative Murphy's sign. No hernia.  Diffuse R sided tenderness  Musculoskeletal: Normal range of motion.  Neurological: She is alert and oriented to person,  place, and time. She has normal strength. No cranial nerve deficit or sensory deficit. Coordination normal. GCS eye subscore is 4. GCS verbal subscore is 5. GCS motor subscore is 6.  Skin: Skin is warm, dry and intact. No rash noted. No cyanosis.  Psychiatric: She has a normal mood and affect. Her speech is normal and behavior is normal. Thought content normal.   ED Course  Procedures (including critical care time) DIAGNOSTIC STUDIES: Oxygen Saturation is 96% on RA, adequate by my interpretation.    COORDINATION OF CARE: 9:31 PM Discussed treatment plan with pt at bedside and pt agreed to plan.  Labs Review Labs Reviewed  CBC WITH DIFFERENTIAL - Abnormal; Notable for the following:    WBC 12.5 (*)    Neutro Abs 8.9 (*)    Monocytes Absolute 1.3 (*)    All other components within normal limits  COMPREHENSIVE METABOLIC PANEL - Abnormal; Notable for the following:    Potassium 3.0 (*)    Glucose, Bld 108 (*)    Creatinine, Ser 0.42 (*)    Albumin 3.1 (*)    Alkaline Phosphatase 120 (*)    All other components within normal limits  LACTIC ACID, PLASMA - Abnormal; Notable for the following:    Lactic Acid, Venous 2.6 (*)    All other components within normal limits  URINALYSIS, ROUTINE W REFLEX MICROSCOPIC - Abnormal; Notable for the following:    Glucose, UA 250 (*)    Hgb urine dipstick MODERATE (*)    Protein, ur TRACE (*)    Urobilinogen, UA 4.0 (*)    All other components within normal limits  URINE MICROSCOPIC-ADD ON - Abnormal; Notable for the following:    Squamous Epithelial / LPF FEW (*)    All other components within normal limits  LIPASE, BLOOD   Imaging Review No results found.   EKG Interpretation None      MDM   Final diagnoses:  Abdominal pain  Hypokalemia    Presents to the ER with multiple complaints. Patient being of right-sided abdominal pain. She reports that she has chronic pain in this area, but there is some pain radiating around to the back  as well. This component is new. Patient reports that she has history of pancreatitis and has chronic abdominal pain from that.  She has not had vomiting or diarrhea. She has noticed darkness in her urine, possible pus. She thinks she might have a urinary tract infection. Urinalysis pending at this time.  Patient has very slight leukocytosis, WBC at 12.5. She has hypokalemia, otherwise compared to panel was normal. Lipase is normal. Very slight lactic acidosis. Patient to be given IV fluids. We'll have a CT scan to evaluate for intra-abdominal pathology causing her pain and laboratory value abnormalities. Will sign out to Doctor Eulis Foster, followup urinalysis and CT, disposition patient accordingly.  I personally performed the services described in this documentation, which was scribed in my presence. The recorded information has been reviewed and is accurate.    Candace Greek, MD 03/10/14 0020

## 2014-03-09 NOTE — ED Notes (Addendum)
Pain rt flank, no dysuria,  Urine has an odor.  Sore throat.  Nausea, no vomiting  .   Urine is dark

## 2014-03-10 LAB — URINALYSIS, ROUTINE W REFLEX MICROSCOPIC
BILIRUBIN URINE: NEGATIVE
Glucose, UA: 250 mg/dL — AB
Ketones, ur: NEGATIVE mg/dL
Leukocytes, UA: NEGATIVE
Nitrite: NEGATIVE
Specific Gravity, Urine: 1.025 (ref 1.005–1.030)
Urobilinogen, UA: 4 mg/dL — ABNORMAL HIGH (ref 0.0–1.0)
pH: 6 (ref 5.0–8.0)

## 2014-03-10 LAB — URINE MICROSCOPIC-ADD ON

## 2014-03-10 MED ORDER — OXYCODONE-ACETAMINOPHEN 5-325 MG PO TABS: 1.0000 | ORAL_TABLET | ORAL | Status: DC | PRN

## 2014-03-10 MED ORDER — POTASSIUM CHLORIDE CRYS ER 20 MEQ PO TBCR
40.0000 meq | EXTENDED_RELEASE_TABLET | Freq: Once | ORAL | Status: AC
  Administered 2014-03-10: 40 meq via ORAL
  Filled 2014-03-10: qty 2

## 2014-03-10 NOTE — ED Provider Notes (Signed)
00:15- Care from Dr. Betsey Holiday to evaluate after testing returned. \  01:45- the patient is comfortable at this time. She has no additional complaints. She has been treated with narcotic analgesia, potassium, and IV fluids. She states that she feels better. She states that she has frequent bouts of pain like this when her pancreas "flares up." She feels like she can go home. Findings were discussed with the patient, all questions answered.  Medications  0.9 %  sodium chloride infusion ( Intravenous Stopped 03/09/14 2351)  HYDROmorphone (DILAUDID) injection 1 mg (1 mg Intravenous Given 03/09/14 2304)  ondansetron (ZOFRAN) injection 4 mg (4 mg Intravenous Given 03/09/14 2303)  potassium chloride SA (K-DUR,KLOR-CON) CR tablet 40 mEq (40 mEq Oral Given 03/10/14 0037)    Patient Vitals for the past 24 hrs:  BP Temp Temp src Pulse Resp SpO2 Height Weight  03/10/14 0102 97/64 mmHg - - 85 18 97 % - -  03/09/14 2114 141/74 mmHg 97.8 F (36.6 C) Oral 101 20 96 % 5\' 5"  (1.651 m) 81 lb (36.741 kg)    Nursing Notes Reviewed/ Care Coordinated Applicable Imaging Reviewed Interpretation of Laboratory Data incorporated into ED treatment  The patient appears reasonably screened and/or stabilized for discharge and I doubt any other medical condition or other [facility] requiring further screening, evaluation, or treatment in the ED at this time prior to discharge.  Plan: Home Medications- Percocet; Home Treatments- rest, gradually advance diet; return here if the recommended treatment, does not improve the symptoms; Recommended follow up- PCP for check up in 2-3 days.  Richarda Blade, MD 03/10/14 [phone]

## 2014-03-10 NOTE — Discharge Instructions (Signed)
Drink plenty of fluids. Try to eat foods that are high in potassium.    Abdominal Pain, Adult Many things can cause abdominal pain. Usually, abdominal pain is not caused by a disease and will improve without treatment. It can often be observed and treated at home. Your health care provider will do a physical exam and possibly order blood tests and X-rays to help determine the seriousness of your pain. However, in many cases, more time must pass before a clear cause of the pain can be found. Before that point, your health care provider may not know if you need more testing or further treatment. HOME CARE INSTRUCTIONS  Monitor your abdominal pain for any changes. The following actions may help to alleviate any discomfort you are experiencing:  Only take over-the-counter or prescription medicines as directed by your health care provider.  Do not take laxatives unless directed to do so by your health care provider.  Try a clear liquid diet (broth, tea, or water) as directed by your health care provider. Slowly move to a bland diet as tolerated. SEEK MEDICAL CARE IF:  You have unexplained abdominal pain.  You have abdominal pain associated with nausea or diarrhea.  You have pain when you urinate or have a bowel movement.  You experience abdominal pain that wakes you in the night.  You have abdominal pain that is worsened or improved by eating food.  You have abdominal pain that is worsened with eating fatty foods. SEEK IMMEDIATE MEDICAL CARE IF:   Your pain does not go away within 2 hours.  You have a fever.  You keep throwing up (vomiting).  Your pain is felt only in portions of the abdomen, such as the right side or the left lower portion of the abdomen.  You pass bloody or black tarry stools. MAKE SURE YOU:  Understand these instructions.   Will watch your condition.   Will get help right away if you are not doing well or get worse.  Document Released: 06/24/2005  Document Revised: 07/05/2013 Document Reviewed: 05/24/2013 [facility] Patient Information 2014 [place].  Hypokalemia Hypokalemia means that the amount of potassium in the blood is lower than normal.Potassium is a chemical, called an electrolyte, that helps regulate the amount of fluid in the body. It also stimulates muscle contraction and helps nerves function properly.Most of the body's potassium is inside of cells, and only a very small amount is in the blood. Because the amount in the blood is so small, minor changes can be life-threatening. CAUSES  Antibiotics.  Diarrhea or vomiting.  Using laxatives too much, which can cause diarrhea.  Chronic kidney disease.  Water pills (diuretics).  Eating disorders (bulimia).  Low magnesium level.  Sweating a lot. SIGNS AND SYMPTOMS  Weakness.  Constipation.  Fatigue.  Muscle cramps.  Mental confusion.  Skipped heartbeats or irregular heartbeat (palpitations).  Tingling or numbness. DIAGNOSIS  Your health care provider can diagnose hypokalemia with blood tests. In addition to checking your potassium level, your health care provider may also check other lab tests. TREATMENT Hypokalemia can be treated with potassium supplements taken by mouth or adjustments in your current medicines. If your potassium level is very low, you may need to get potassium through a vein (IV) and be monitored in the hospital. A diet high in potassium is also helpful. Foods high in potassium are:  Nuts, such as peanuts and pistachios.  Seeds, such as sunflower seeds and pumpkin seeds.  Peas, lentils, and lima beans.  Whole  grain and bran cereals and breads.  Fresh fruit and vegetables, such as apricots, avocado, bananas, cantaloupe, kiwi, oranges, tomatoes, asparagus, and potatoes.  Orange and tomato juices.  Red meats.  Fruit yogurt. HOME CARE INSTRUCTIONS  Take all medicines as prescribed by your health care provider.  Maintain a  healthy diet by including nutritious food, such as fruits, vegetables, nuts, whole grains, and lean meats.  If you are taking a laxative, be sure to follow the directions on the label. SEEK MEDICAL CARE IF:  Your weakness gets worse.  You feel your heart pounding or racing.  You are vomiting or having diarrhea.  You are diabetic and having trouble keeping your blood glucose in the normal range. SEEK IMMEDIATE MEDICAL CARE IF:  You have chest pain, shortness of breath, or dizziness.  You are vomiting or having diarrhea for more than 2 days.  You faint. MAKE SURE YOU:   Understand these instructions.  Will watch your condition.  Will get help right away if you are not doing well or get worse. Document Released: 09/14/2005 Document Revised: 07/05/2013 Document Reviewed: 03/17/2013 [facility] Patient Information 2014 [place].  Potassium Content of Foods Potassium is a mineral found in many foods and drinks. It helps keep fluids and minerals balanced in your body and also affects how steadily your heart beats. The body needs potassium to control blood pressure and to keep the muscles and nervous system healthy. However, certain health conditions and medicine may require you to eat more or less potassium-rich foods and drinks. Your caregiver or dietitian will tell you how much potassium you should have each day. COMMON SERVING SIZES The list below tells you how big or small common portion sizes are:  1 oz.........4 stacked dice.  3 oz........Marland KitchenDeck of cards.  1 tsp.......Marland KitchenTip of little finger.  1 tbsp....Marland KitchenMarland KitchenThumb.  2 tbsp....Marland KitchenMarland KitchenGolf ball.   c..........Marland KitchenHalf of a fist.  1 c...........Marland KitchenA fist. FOODS AND DRINKS HIGH IN POTASSIUM More than 200 mg of potassium per serving. A serving size is  c (120 mL or noted gram weight) unless otherwise stated. While all the items on this list are high in potassium, some items are higher in potassium than others. Fruits  Apricots  (sliced), 83 g.  Apricots (dried halves), 3 oz / 24 g.  Avocado (cubed),  c / 50 g.  Banana (sliced), 75 g.  Cantaloupe (cubed), 80 g.  Dates (pitted), 5 whole / 35 g.  Figs (dried), 4 whole / 32 g.  Guava, c / 55 g.  Honeydew, 1 wedge / 85 g.  Kiwi (sliced), 90 g.  Nectarine, 1 small / 129 g.  Orange, 1 medium / 131 g.  Orange juice.  Pomegranate seeds, 87 g.  Pomegranate juice.  Prunes (pitted), 3 whole / 30 g.  Prune juice, 3 oz / 90 mL.  Seedless raisins, 3 tbsp / 27 g. Vegetables  Artichoke,  of a medium / 64 g.  Asparagus (boiled), 90 g.  Baked beans,  c / 63 g.  Bamboo shoots,  c / 38 g.  Beets (cooked slices), 85 g.  Broccoli (boiled), 78 g.  Brussels sprout (boiled), 78 g.  Butternut squash (baked), 103 g.  Chickpea (cooked), 82 g.  Green peas (cooked), 80 g.  Hubbard squash (baked cubes),  c / 68 g.  Kidney beans (cooked), 5 tbsp / 55 g.  Lima beans (cooked),  c / 43 g.  Navy beans (cooked),  c / 61 g.  Potato (baked), 61 g.  Potato (boiled), 78 g.  Pumpkin (boiled), 123 g.  Refried beans,  c / 79 g.  Spinach (cooked),  c / 45 g.  Split peas (cooked),  c / 65 g.  Sun-dried tomatoes, 2 tbsp / 7 g.  Sweet potato (baked),  c / 50 g.  Tomato (chopped or sliced), 90 g.  Tomato juice.  Tomato paste, 4 tsp / 21 g.  Tomato sauce,  c / 61 g.  Vegetable juice.  White mushrooms (cooked), 78 g.  Yam (cooked or baked),  c / 34 g.  Zucchini squash (boiled), 90 g. Other Foods and Drinks  Almonds (whole),  c / 36 g.  Cashews (oil roasted),  c / 32 g.  Chocolate milk.  Chocolate pudding, 142 g.  Clams (steamed), 1.5 oz / 43 g.  Dark chocolate, 1.5 oz / 42 g.  Fish, 3 oz / 85 g.  King crab (steamed), 3 oz / 85 g.  Lobster (steamed), 4 oz / 113 g.  Milk (skim, 1%, 2%, whole), 1 c / 240 mL.  Milk chocolate, 2.3 oz / 66 g.  Milk shake.  Nonfat fruit variety yogurt, 123 g.  Peanuts (oil roasted),  1 oz / 28 g.  Peanut butter, 2 tbsp / 32 g.  Pistachio nuts, 1 oz / 28 g.  Pumpkin seeds, 1 oz / 28 g.  Red meat (broiled, cooked, grilled), 3 oz / 85 g.  Scallops (steamed), 3 oz / 85 g.  Shredded wheat cereal (dry), 3 oblong biscuits / 75 g.  Spaghetti sauce,  c / 66 g.  Sunflower seeds (dry roasted), 1 oz / 28 g.  Veggie burger, 1 patty / 70 g. FOODS MODERATE IN POTASSIUM Between 150 mg and 200 mg per serving. A serving is  c (120 mL or noted gram weight) unless otherwise stated. Fruits  Grapefruit,  of the fruit / 123 g.  Grapefruit juice.  Pineapple juice.  Plums (sliced), 83 g.  Tangerine, 1 large / 120 g. Vegetables  Carrots (boiled), 78 g.  Carrots (sliced), 61 g.  Rhubarb (cooked with sugar), 120 g.  Rutabaga (cooked), 120 g.  Sweet corn (cooked), 75 g.  Yellow snap beans (cooked), 63 g. Other Foods and Drinks   Bagel, 1 bagel / 98 g.  Chicken breast (roasted and chopped),  c / 70 g.  Chocolate ice cream / 66 g.  Pita bread, 1 large / 64 g.  Shrimp (steamed), 4 oz / 113 g.  Swiss cheese (diced), 70 g.  Vanilla ice cream, 66 g.  Vanilla pudding, 140 g. FOODS LOW IN POTASSIUM Less than 150 mg per serving. A serving size is  cup (120 mL or noted gram weight) unless otherwise stated. If you eat more than 1 serving of a food low in potassium, the food may be considered a food high in potassium. Fruits  Apple (slices), 55 g.  Apple juice.  Applesauce, 122 g.  Blackberries, 72 g.  Blueberries, 74 g.  Cranberries, 50 g.  Cranberry juice.  Fruit cocktail, 119 g.  Fruit punch.  Grapes, 46 g.  Grape juice.  Mandarin oranges (canned), 126 g.  Peach (slices), 77 g.  Pineapple (chunks), 83 g.  Raspberries, 62 g.  Red cherries (without pits), 78 g.  Strawberries (sliced), 83 g.  Watermelon (diced), 76 g. Vegetables  Alfalfa sprouts, 17 g.  Bell peppers (sliced), 46 g.  Cabbage (shredded), 35 g.  Cauliflower  (boiled), 62 g.  Celery, 51 g.  Collard  greens (boiled), 95 g.  Cucumber (sliced), 52 g.  Eggplant (cubed), 41 g.  Green beans (boiled), 63 g.  Lettuce (shredded), 1 c / 36 g.  Onions (sauteed), 44 g.  Radishes (sliced), 58 g.  Spaghetti squash, 51 g. Other Foods and Drinks  [employer], 1 slice / 28 g.  Black tea.  Brown rice (cooked), 98 g.  Butter croissant, 1 medium / 57 g.  Carbonated soda.  Coffee.  Cheddar cheese (diced), 66 g.  Corn flake cereal (dry), 14 g.  Cottage cheese, 118 g.  Cream of rice cereal (cooked), 122 g.  Cream of wheat cereal (cooked), 126 g.  Crisped rice cereal (dry), 14 g.  Egg (boiled, fried, poached, omelet, scrambled), 1 large / 46 61 g.  English muffin, 1 muffin / 57 g.  Frozen ice pop, 1 pop / 55 g.  Graham cracker, 1 large rectangular cracker / 14 g.  Jelly beans, 112 g.  Non-dairy whipped topping.  Oatmeal, 88 g.  Orange sherbet, 74 g.  Puffed rice cereal (dry), 7 g.  Pasta (cooked), 70 g.  Rice cakes, 4 cakes / 36 g.  Sugared doughnut, 4 oz / 116 g.  White bread, 1 slice / 30 g.  White rice (cooked), 79 93 g.  Wild rice (cooked), 82 g.  Yellow cake, 1 slice / 68 g. Document Released: 04/28/2005 Document Revised: 08/31/2012 Document Reviewed: 01/29/2012 [facility] Patient Information 2014 [place].

## 2014-03-12 MED FILL — Oxycodone w/ Acetaminophen Tab 5-325 MG: ORAL | Qty: 6 | Status: AC

## 2014-03-19 ENCOUNTER — Encounter (HOSPITAL_COMMUNITY): Payer: Self-pay | Admitting: Emergency Medicine

## 2014-03-19 ENCOUNTER — Emergency Department (HOSPITAL_COMMUNITY)
Admission: EM | Admit: 2014-03-19 | Discharge: 2014-03-20 | Disposition: A | Discharge status: Home / Self Care | Payer: PRIVATE HEALTH INSURANCE | Attending: Emergency Medicine | Admitting: Emergency Medicine

## 2014-03-19 DIAGNOSIS — Z860100 Personal history of colon polyps, unspecified: Secondary | ICD-10-CM | POA: Insufficient documentation

## 2014-03-19 DIAGNOSIS — Z87442 Personal history of urinary calculi: Secondary | ICD-10-CM | POA: Insufficient documentation

## 2014-03-19 DIAGNOSIS — F3289 Other specified depressive episodes: Secondary | ICD-10-CM | POA: Insufficient documentation

## 2014-03-19 DIAGNOSIS — Z8759 Personal history of other complications of pregnancy, childbirth and the puerperium: Secondary | ICD-10-CM | POA: Insufficient documentation

## 2014-03-19 DIAGNOSIS — I2510 Atherosclerotic heart disease of native coronary artery without angina pectoris: Secondary | ICD-10-CM | POA: Insufficient documentation

## 2014-03-19 DIAGNOSIS — Z79899 Other long term (current) drug therapy: Secondary | ICD-10-CM | POA: Diagnosis not present

## 2014-03-19 DIAGNOSIS — F17200 Nicotine dependence, unspecified, uncomplicated: Secondary | ICD-10-CM | POA: Insufficient documentation

## 2014-03-19 DIAGNOSIS — G8929 Other chronic pain: Secondary | ICD-10-CM | POA: Insufficient documentation

## 2014-03-19 DIAGNOSIS — T4995XA Adverse effect of unspecified topical agent, initial encounter: Secondary | ICD-10-CM | POA: Insufficient documentation

## 2014-03-19 DIAGNOSIS — E785 Hyperlipidemia, unspecified: Secondary | ICD-10-CM | POA: Insufficient documentation

## 2014-03-19 DIAGNOSIS — Z8541 Personal history of malignant neoplasm of cervix uteri: Secondary | ICD-10-CM | POA: Diagnosis not present

## 2014-03-19 DIAGNOSIS — F411 Generalized anxiety disorder: Secondary | ICD-10-CM | POA: Insufficient documentation

## 2014-03-19 DIAGNOSIS — F329 Major depressive disorder, single episode, unspecified: Secondary | ICD-10-CM | POA: Diagnosis not present

## 2014-03-19 DIAGNOSIS — Z8673 Personal history of transient ischemic attack (TIA), and cerebral infarction without residual deficits: Secondary | ICD-10-CM | POA: Diagnosis not present

## 2014-03-19 DIAGNOSIS — L509 Urticaria, unspecified: Secondary | ICD-10-CM

## 2014-03-19 DIAGNOSIS — Z792 Long term (current) use of antibiotics: Secondary | ICD-10-CM | POA: Insufficient documentation

## 2014-03-19 DIAGNOSIS — R21 Rash and other nonspecific skin eruption: Secondary | ICD-10-CM | POA: Diagnosis present

## 2014-03-19 MED ORDER — DIPHENHYDRAMINE HCL 25 MG PO CAPS
50.0000 mg | ORAL_CAPSULE | Freq: Once | ORAL | Status: AC
Start: 2014-03-20 — End: 2014-03-19
  Administered 2014-03-19: 50 mg via ORAL
  Filled 2014-03-19: qty 2

## 2014-03-19 MED ORDER — FAMOTIDINE 20 MG PO TABS
20.0000 mg | ORAL_TABLET | Freq: Once | ORAL | Status: AC
  Administered 2014-03-19: 20 mg via ORAL
  Filled 2014-03-19: qty 1

## 2014-03-19 MED ORDER — PREDNISONE 50 MG PO TABS
60.0000 mg | ORAL_TABLET | Freq: Once | ORAL | Status: AC
  Administered 2014-03-19: 60 mg via ORAL
  Filled 2014-03-19 (×2): qty 1

## 2014-03-19 NOTE — ED Notes (Signed)
Patient started breaking out today around 4 pm. From arms to back

## 2014-03-19 NOTE — ED Notes (Signed)
States allergic reaction starting at 1600. Pt c/o red itching rash to lower abdominal area, arms, and back. No trouble breathing or swallowing. Lung sounds clear. Pt A&O x4.

## 2014-03-19 NOTE — ED Provider Notes (Signed)
CSN: [id number]     Arrival date & time 03/19/14  2219 History   First MD Initiated Contact with Patient 03/19/14 2336    This chart was scribed for Julianne Rice, MD by Terressa Koyanagi, ED Scribe. This patient was seen in room APA14/APA14 and the patient's care was started at 11:40 PM.  Chief Complaint  Patient presents with  . Allergic Reaction   The history is provided by the patient. No language interpreter was used.   HPI Comments: [name] is a 68 y.o. female, with an extensive medical Hx noted below, who presents to the Emergency Department complaining of hives to her arms and back onset today around 4PM. Pt reports she has a Hx of allergic reactions with similar Sx to those exhibited today; historically she takes benadryl during similar episodes. Pt reports, however, she did not take any measures at home to alleviate her Sx tonight. Pt further states that she has a lengthy list of allergies. Pt denies changing any of her hygiene products recently. Pt reports, however, that a couple of days ago she was not feeling well and someone else did her laundry, which she states may be the cause of her allergic reaction. Pt denies n/v/d, swelling in the mouth, sob.   Past Medical History  Diagnosis Date  . CAD (coronary artery disease)     palpitations, dizziness, chest pain  . Hx of colonic polyps     adenomatous  . Tobacco abuse   . Back pain, chronic   . Allergic rhinitis   . Anxiety and depression   . Pancreatitis chronic   . Hematochezia   . GERD (gastroesophageal reflux disease)   . COPD (chronic obstructive pulmonary disease)   . Weight loss     CT negative for occult malignancy, negative celiac, negative adrenal insufficiency  . Hyperlipidemia     Lipid profile on 02/25/2011: 209, 113, 55, 132  . Family history of anesthesia complication   . 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 Anxiety   . Depression   . Chronic abdominal pain   . Cervical cancer   .  Nephrolithiasis   . Renal calculus    Past Surgical History  Procedure Laterality Date  . Partial hysterectomy    . Appendectomy    . Colonoscopy w/ polypectomy  2006    SAY:[id number], benign gastric nodule, multiple adenomatous polyps, one with tubular morphology  . Ileocolonoscopy  01/11/2009    RMR: Polyp at the splenic flexure, status post hot snare removal/ Normal rectum, tubular adenoma  . Esophagogastroduodenoscopy  2006    FTD:DUKGUR gastric nodule  . Eus  2010    Dr. Ardis Hughs : Multiple shadowing calcifications in pancreas, consistent with Chronic Pancreatitis.  These are mainly confined to a collection of calcifications in head/uncinate pancreas. Otherwise the pancreatic parenchyma appears fairly normal.  The main pancreatic duct, CBD are both normal without stones, dilation.    . Cataract extraction w/phaco Right 05/04/2013    Procedure: CATARACT EXTRACTION PHACO AND INTRAOCULAR LENS PLACEMENT (IOC);  Surgeon: Tonny Branch, MD;  Location: [facility];  Service: Ophthalmology;  Laterality: Right;  CDE:16.17  . Cataract extraction w/phaco Left 05/22/2013    Procedure: CATARACT EXTRACTION PHACO AND INTRAOCULAR LENS PLACEMENT (IOC);  Surgeon: Tonny Branch, MD;  Location: [facility];  Service: Ophthalmology;  Laterality: Left;  CDE:14.75  . Abdominal hysterectomy    . Colonoscopy N/A 02/08/2014    Procedure: COLONOSCOPY;  Surgeon: Daneil Dolin, MD;  Location: [facility];  Service: Endoscopy;  Laterality: N/A;  10:00-moved to 1030 Leigh Ann to notify pt  . Esophagogastroduodenoscopy N/A 02/08/2014    Procedure: ESOPHAGOGASTRODUODENOSCOPY (EGD);  Surgeon: Daneil Dolin, MD;  Location: [facility];  Service: Endoscopy;  Laterality: N/A;   Family History  Problem Relation Age of Onset  . Heart attack Father 42  . Colon cancer Maternal Grandmother    History  Substance Use Topics  . Smoking status: Current Every Day Smoker -- 1.00 packs/day for 50 years    Types: Cigarettes  . Smokeless  tobacco: Never Used  . Alcohol Use: No     Comment: hx of ETOH about 20 years ago.    OB History   Grav Para Term Preterm Abortions TAB SAB Ect Mult Living                 Review of Systems  Constitutional: Negative for fever and chills.  HENT: Negative for facial swelling, trouble swallowing and voice change.   Respiratory: Negative for shortness of breath, wheezing and stridor.   Cardiovascular: Negative for chest pain.  Gastrointestinal: Negative for nausea, vomiting and abdominal pain.  Musculoskeletal: Negative for back pain, neck pain and neck stiffness.  Skin: Positive for rash. Negative for wound.  Neurological: Negative for dizziness, weakness, light-headedness, numbness and headaches.  All other systems reviewed and are negative.     Allergies  Ibuprofen and Iohexol  Home Medications   Prior to Admission medications   Medication Sig Start Date End Date Taking? Authorizing Provider  acetaminophen (TYLENOL) 325 MG tablet Take 650 mg by mouth every 6 (six) hours as needed for mild pain.    Historical Provider, MD  ADVAIR DISKUS 250-50 MCG/DOSE AEPB Inhale 1 puff into the lungs 2 (two) times daily.  10/05/12   Historical Provider, MD  alendronate (FOSAMAX) 70 MG tablet Take 70 mg by mouth every 7 (seven) days. Take with a full glass of water on an empty stomach. Patient take on monday    Historical Provider, MD  amoxicillin (AMOXIL) 500 MG capsule Take 500 mg by mouth 4 (four) times daily. 14 day supply starting on 02/14/2014 02/14/14   Historical Provider, MD  Calcium Carbonate (CALCIUM 600 PO) Take 1 tablet by mouth daily.      Historical Provider, MD  cilostazol (PLETAL) 100 MG tablet Take 100 mg by mouth 2 (two) times daily.  12/30/13   Historical Provider, MD  clarithromycin (BIAXIN) 500 MG tablet Take 500 mg by mouth 2 (two) times daily. 14 day course starting on 02/14/2014 02/14/14   Historical Provider, MD  DULoxetine (CYMBALTA) 60 MG capsule Take 60 mg by mouth daily.       Historical Provider, MD  fentaNYL (DURAGESIC - DOSED MCG/HR) 75 MCG/HR Place 1 patch onto the skin every 3 (three) days.    Historical Provider, MD  HYDROcodone-acetaminophen (NORCO/VICODIN) 5-325 MG per tablet Take 1 tablet by mouth daily as needed for moderate pain (taken for breakthrough pain).    Historical Provider, MD  lansoprazole (PREVACID) 30 MG capsule Take 1 capsule by mouth 2 (two) times daily. 14 day course starting on 02/14/2014 02/14/14   Historical Provider, MD  LORazepam (ATIVAN) 1 MG tablet Take 1 mg by mouth every 8 (eight) hours as needed for anxiety.    Historical Provider, MD  nitroGLYCERIN (NITROSTAT) 0.4 MG SL tablet Place 1 tablet (0.4 mg total) under the tongue every 5 (five) minutes as needed. 02/17/12   Cristopher Estimable  Rothbart, MD  omeprazole (PRILOSEC) 20 MG capsule Take 20 mg by mouth daily.    Historical Provider, MD  oxyCODONE-acetaminophen (PERCOCET) 5-325 MG per tablet Take 1 tablet by mouth every 4 (four) hours as needed for severe pain. 03/10/14   Richarda Blade, MD  oxyCODONE-acetaminophen (PERCOCET/ROXICET) 5-325 MG per tablet Take 1 tablet by mouth every 4 (four) hours as needed. 03/10/14   Richarda Blade, MD  Pancrelipase, Lip-Prot-Amyl, (CREON) 24000 UNITS CPEP Take 24,000 Units by mouth 4 (four) times daily -  with meals and at bedtime.     Historical Provider, MD  promethazine (PHENERGAN) 25 MG tablet Take 25 mg by mouth every 6 (six) hours as needed for nausea or vomiting.     Historical Provider, MD  RESTASIS 0.05 % ophthalmic emulsion Place 1 drop into both eyes 2 (two) times daily.  12/30/13   Historical Provider, MD  simvastatin (ZOCOR) 20 MG tablet Take 20 mg by mouth every morning.  10/05/12   Historical Provider, MD   Triage Vitals: BP 114/83  Pulse 96  Temp(Src) 97.6 F (36.4 C) (Oral)  Resp 20  Ht 5\' 4"  (1.626 m)  Wt 81 lb (36.741 kg)  BMI 13.90 kg/m2  SpO2 96% Physical Exam  Nursing note and vitals reviewed. Constitutional: She is oriented to person,  place, and time. She appears well-developed and well-nourished. No distress.  HENT:  Head: Normocephalic and atraumatic.  Mouth/Throat: Oropharynx is clear and moist.  No facial or intraoral swelling.  Eyes: EOM are normal. Pupils are equal, round, and reactive to light.  Neck: Normal range of motion. Neck supple.  Cardiovascular: Normal rate and regular rhythm.   Pulmonary/Chest: Effort normal and breath sounds normal. No stridor. No respiratory distress. She has no wheezes. She has no rales. She exhibits no tenderness.  Abdominal: Soft. Bowel sounds are normal.  Musculoskeletal: Normal range of motion. She exhibits no edema and no tenderness.  Neurological: She is alert and oriented to person, place, and time.  Skin: Skin is warm and dry. Rash (raised erythematous rash to the patient's back and bilateral forearms. Consistent with urticaria.) noted. No erythema. No pallor.  Psychiatric: She has a normal mood and affect. Her behavior is normal.    ED Course  Procedures (including critical care time) DIAGNOSTIC STUDIES: Oxygen Saturation is 96% on RA, normal by my interpretation.    COORDINATION OF CARE: 11:45 PM-Discussed treatment plan which includes meds with pt at bedside. Patient verbalizes understanding and agrees with treatment plan.   Labs Review Labs Reviewed - No data to display  Imaging Review No results found.   EKG Interpretation None      MDM   Final diagnoses:  None    I personally performed the services described in this documentation, which was scribed in my presence. The recorded information has been reviewed and is accurate.  Patient's symptoms have improved after medication. She said she is ready to be discharged home. She no point had any oral or airway involvement. Return precautions given.   Julianne Rice, MD 03/20/14 [phone]

## 2014-03-20 DIAGNOSIS — L509 Urticaria, unspecified: Secondary | ICD-10-CM | POA: Diagnosis not present

## 2014-03-20 MED ORDER — PREDNISONE 50 MG PO TABS: 50.0000 mg | ORAL_TABLET | Freq: Every day | ORAL | Status: DC

## 2014-03-20 MED ORDER — HYDROXYZINE HCL 25 MG PO TABS: 25.0000 mg | ORAL_TABLET | Freq: Four times a day (QID) | ORAL | Status: DC

## 2014-03-20 NOTE — Discharge Instructions (Signed)
Hives Hives are itchy, red, swollen areas of the skin. They can vary in size and location on your body. Hives can come and go for hours or several days (acute hives) or for several weeks (chronic hives). Hives do not spread from person to person (noncontagious). They may get worse with scratching, exercise, and emotional stress. CAUSES   Allergic reaction to food, additives, or drugs.  Infections, including the common cold.  Illness, such as vasculitis, lupus, or thyroid disease.  Exposure to sunlight, heat, or cold.  Exercise.  Stress.  Contact with chemicals. SYMPTOMS   Red or white swollen patches on the skin. The patches may change size, shape, and location quickly and repeatedly.  Itching.  Swelling of the hands, feet, and face. This may occur if hives develop deeper in the skin. DIAGNOSIS  Your caregiver can usually tell what is wrong by performing a physical exam. Skin or blood tests may also be done to determine the cause of your hives. In some cases, the cause cannot be determined. TREATMENT  Mild cases usually get better with medicines such as antihistamines. Severe cases may require an emergency epinephrine injection. If the cause of your hives is known, treatment includes avoiding that trigger.  HOME CARE INSTRUCTIONS   Avoid causes that trigger your hives.  Take antihistamines as directed by your caregiver to reduce the severity of your hives. Non-sedating or low-sedating antihistamines are usually recommended. Do not drive while taking an antihistamine.  Take any other medicines prescribed for itching as directed by your caregiver.  Wear loose-fitting clothing.  Keep all follow-up appointments as directed by your caregiver. SEEK MEDICAL CARE IF:   You have persistent or severe itching that is not relieved with medicine.  You have painful or swollen joints. SEEK IMMEDIATE MEDICAL CARE IF:   You have a fever.  Your tongue or lips are swollen.  You have  trouble breathing or swallowing.  You feel tightness in the throat or chest.  You have abdominal pain. These problems may be the first sign of a life-threatening allergic reaction. Call your local emergency services (911 in [place] MAKE SURE YOU:   Understand these instructions.  Will watch your condition.  Will get help right away if you are not doing well or get worse. Document Released: 09/14/2005 Document Revised: 09/19/2013 Document Reviewed: 12/08/2011 [employer] Patient Information 2015 [employer]. This information is not intended to replace advice given to you by your health care provider. Make sure you discuss any questions you have with your health care provider.  

## 2014-03-20 NOTE — ED Notes (Signed)
Pt sts understanding of DC instructions and medication treatment with home care. Pt ambulatory out of department at this time

## 2014-06-14 ENCOUNTER — Ambulatory Visit (HOSPITAL_COMMUNITY)
Admission: RE | Admit: 2014-06-14 | Discharge: 2014-06-14 | Disposition: A | Discharge status: Home / Self Care | Payer: PRIVATE HEALTH INSURANCE | Source: Ambulatory Visit | Attending: Family Medicine | Admitting: Family Medicine

## 2014-06-14 ENCOUNTER — Other Ambulatory Visit (HOSPITAL_COMMUNITY): Payer: Self-pay | Admitting: Family Medicine

## 2014-06-14 DIAGNOSIS — M545 Low back pain, unspecified: Secondary | ICD-10-CM | POA: Diagnosis not present

## 2014-06-29 ENCOUNTER — Other Ambulatory Visit: Payer: Self-pay | Admitting: Gastroenterology

## 2014-07-02 ENCOUNTER — Other Ambulatory Visit: Payer: Self-pay | Admitting: Gastroenterology

## 2014-07-07 ENCOUNTER — Encounter (HOSPITAL_COMMUNITY): Payer: Self-pay | Admitting: Emergency Medicine

## 2014-07-07 ENCOUNTER — Emergency Department (HOSPITAL_COMMUNITY)
Admission: EM | Admit: 2014-07-07 | Discharge: 2014-07-07 | Disposition: A | Discharge status: Home / Self Care | Payer: PRIVATE HEALTH INSURANCE | Attending: Emergency Medicine | Admitting: Emergency Medicine

## 2014-07-07 DIAGNOSIS — F329 Major depressive disorder, single episode, unspecified: Secondary | ICD-10-CM | POA: Insufficient documentation

## 2014-07-07 DIAGNOSIS — E785 Hyperlipidemia, unspecified: Secondary | ICD-10-CM | POA: Diagnosis not present

## 2014-07-07 DIAGNOSIS — Z79899 Other long term (current) drug therapy: Secondary | ICD-10-CM | POA: Diagnosis not present

## 2014-07-07 DIAGNOSIS — Z720 Tobacco use: Secondary | ICD-10-CM | POA: Diagnosis not present

## 2014-07-07 DIAGNOSIS — Z8673 Personal history of transient ischemic attack (TIA), and cerebral infarction without residual deficits: Secondary | ICD-10-CM | POA: Diagnosis not present

## 2014-07-07 DIAGNOSIS — Z86010 Personal history of colonic polyps: Secondary | ICD-10-CM | POA: Diagnosis not present

## 2014-07-07 DIAGNOSIS — Z8541 Personal history of malignant neoplasm of cervix uteri: Secondary | ICD-10-CM | POA: Insufficient documentation

## 2014-07-07 DIAGNOSIS — Z792 Long term (current) use of antibiotics: Secondary | ICD-10-CM | POA: Diagnosis not present

## 2014-07-07 DIAGNOSIS — G8929 Other chronic pain: Secondary | ICD-10-CM | POA: Diagnosis not present

## 2014-07-07 DIAGNOSIS — J449 Chronic obstructive pulmonary disease, unspecified: Secondary | ICD-10-CM | POA: Insufficient documentation

## 2014-07-07 DIAGNOSIS — I2510 Atherosclerotic heart disease of native coronary artery without angina pectoris: Secondary | ICD-10-CM | POA: Insufficient documentation

## 2014-07-07 DIAGNOSIS — Z7952 Long term (current) use of systemic steroids: Secondary | ICD-10-CM | POA: Diagnosis not present

## 2014-07-07 DIAGNOSIS — F419 Anxiety disorder, unspecified: Secondary | ICD-10-CM | POA: Diagnosis not present

## 2014-07-07 DIAGNOSIS — R21 Rash and other nonspecific skin eruption: Secondary | ICD-10-CM | POA: Diagnosis present

## 2014-07-07 DIAGNOSIS — K219 Gastro-esophageal reflux disease without esophagitis: Secondary | ICD-10-CM | POA: Insufficient documentation

## 2014-07-07 DIAGNOSIS — L509 Urticaria, unspecified: Secondary | ICD-10-CM

## 2014-07-07 DIAGNOSIS — Z7983 Long term (current) use of bisphosphonates: Secondary | ICD-10-CM | POA: Insufficient documentation

## 2014-07-07 MED ORDER — FAMOTIDINE 20 MG PO TABS
20.0000 mg | ORAL_TABLET | Freq: Once | ORAL | Status: AC
  Administered 2014-07-07: 20 mg via ORAL
  Filled 2014-07-07: qty 1

## 2014-07-07 MED ORDER — HYDROXYZINE HCL 25 MG PO TABS
50.0000 mg | ORAL_TABLET | Freq: Once | ORAL | Status: AC
  Administered 2014-07-07: 50 mg via ORAL
  Filled 2014-07-07: qty 2

## 2014-07-07 MED ORDER — HYDROXYZINE HCL 25 MG PO TABS: 25.0000 mg | ORAL_TABLET | Freq: Four times a day (QID) | ORAL | Status: DC | PRN

## 2014-07-07 NOTE — ED Provider Notes (Signed)
CSN: [id number]     Arrival date & time 07/07/14  2058 History   First MD Initiated Contact with Patient 07/07/14 2148     Chief Complaint  Patient presents with  . Rash     (Consider location/radiation/quality/duration/timing/severity/associated sxs/prior Treatment) Patient is a 68 y.o. female presenting with urticaria. The history is provided by the patient.  Urticaria This is a new problem. The current episode started in the past 7 days. The problem occurs intermittently. The problem has been waxing and waning. Associated symptoms include a rash. Pertinent negatives include no abdominal pain, arthralgias, chest pain, chills, coughing, fever, neck pain, sore throat or swollen glands. Nothing aggravates the symptoms. Treatments tried: benadryl. The treatment provided mild relief.    Past Medical History  Diagnosis Date  . CAD (coronary artery disease)     palpitations, dizziness, chest pain  . Hx of colonic polyps     adenomatous  . Tobacco abuse   . Back pain, chronic   . Allergic rhinitis   . Anxiety and depression   . Pancreatitis chronic   . Hematochezia   . GERD (gastroesophageal reflux disease)   . COPD (chronic obstructive pulmonary disease)   . Weight loss     CT negative for occult malignancy, negative celiac, negative adrenal insufficiency  . Hyperlipidemia     Lipid profile on 02/25/2011: 209, 113, 55, 132  . Family history of anesthesia complication   . 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 Anxiety   . Depression   . Chronic abdominal pain   . Cervical cancer   . Nephrolithiasis   . Renal calculus    Past Surgical History  Procedure Laterality Date  . Partial hysterectomy    . Appendectomy    . Colonoscopy w/ polypectomy  2006    [id number], benign gastric nodule, multiple adenomatous polyps, one with tubular morphology  . Ileocolonoscopy  01/11/2009    RMR: Polyp at the splenic flexure, status post hot snare removal/ Normal rectum, tubular  adenoma  . Esophagogastroduodenoscopy  2006    [id number] gastric nodule  . Eus  2010    Dr. Ardis Hughs : Multiple shadowing calcifications in pancreas, consistent with Chronic Pancreatitis.  These are mainly confined to a collection of calcifications in head/uncinate pancreas. Otherwise the pancreatic parenchyma appears fairly normal.  The main pancreatic duct, CBD are both normal without stones, dilation.    . Cataract extraction w/phaco Right 05/04/2013    Procedure: CATARACT EXTRACTION PHACO AND INTRAOCULAR LENS PLACEMENT (IOC);  Surgeon: Tonny Branch, MD;  Location: [facility];  Service: Ophthalmology;  Laterality: Right;  CDE:16.17  . Cataract extraction w/phaco Left 05/22/2013    Procedure: CATARACT EXTRACTION PHACO AND INTRAOCULAR LENS PLACEMENT (IOC);  Surgeon: Tonny Branch, MD;  Location: [facility];  Service: Ophthalmology;  Laterality: Left;  CDE:14.75  . Abdominal hysterectomy    . Colonoscopy N/A 02/08/2014    Procedure: COLONOSCOPY;  Surgeon: Daneil Dolin, MD;  Location: [facility];  Service: Endoscopy;  Laterality: N/A;  10:00-moved to 1030 Leigh Ann to notify pt  . Esophagogastroduodenoscopy N/A 02/08/2014    Procedure: ESOPHAGOGASTRODUODENOSCOPY (EGD);  Surgeon: Daneil Dolin, MD;  Location: [facility];  Service: Endoscopy;  Laterality: N/A;   Family History  Problem Relation Age of Onset  . Heart attack Father 40  . Colon cancer Maternal Grandmother    History  Substance Use Topics  . Smoking status: Current Every Day Smoker -- 1.00 packs/day for 50  years    Types: Cigarettes  . Smokeless tobacco: Never Used  . Alcohol Use: No     Comment: hx of ETOH about 20 years ago.    OB History   Grav Para Term Preterm Abortions TAB SAB Ect Mult Living                 Review of Systems  Constitutional: Negative for fever, chills and activity change.       All ROS Neg except as noted in HPI  HENT: Negative for sore throat.   Eyes: Negative for photophobia and discharge.  Respiratory:  Negative for cough, shortness of breath and wheezing.   Cardiovascular: Negative for chest pain and palpitations.  Gastrointestinal: Negative for abdominal pain and blood in stool.  Genitourinary: Negative for dysuria, frequency and hematuria.  Musculoskeletal: Negative for arthralgias, back pain and neck pain.  Skin: Positive for rash.  Neurological: Negative for dizziness, seizures and speech difficulty.  Psychiatric/Behavioral: Negative for hallucinations and confusion.      Allergies  Ibuprofen and Iohexol  Home Medications   Prior to Admission medications   Medication Sig Start Date End Date Taking? Authorizing Provider  acetaminophen (TYLENOL) 325 MG tablet Take 650 mg by mouth every 6 (six) hours as needed for mild pain.    Historical Provider, MD  ADVAIR DISKUS 250-50 MCG/DOSE AEPB Inhale 1 puff into the lungs 2 (two) times daily.  10/05/12   Historical Provider, MD  alendronate (FOSAMAX) 70 MG tablet Take 70 mg by mouth every 7 (seven) days. Take with a full glass of water on an empty stomach. Patient take on monday    Historical Provider, MD  amoxicillin (AMOXIL) 500 MG capsule Take 500 mg by mouth 4 (four) times daily. 14 day supply starting on 02/14/2014 02/14/14   Historical Provider, MD  Calcium Carbonate (CALCIUM 600 PO) Take 1 tablet by mouth daily.      Historical Provider, MD  cilostazol (PLETAL) 100 MG tablet Take 100 mg by mouth 2 (two) times daily.  12/30/13   Historical Provider, MD  clarithromycin (BIAXIN) 500 MG tablet Take 500 mg by mouth 2 (two) times daily. 14 day course starting on 02/14/2014 02/14/14   Historical Provider, MD  CREON 24000 UNITS CPEP TAKE 2 CAPSULES WITH EACH MEALS AND 1 CAPSULE WITH SNACKS. 07/04/14   Mahala Menghini, PA-C  DULoxetine (CYMBALTA) 60 MG capsule Take 60 mg by mouth daily.      Historical Provider, MD  fentaNYL (DURAGESIC - DOSED MCG/HR) 75 MCG/HR Place 1 patch onto the skin every 3 (three) days.    Historical Provider, MD   HYDROcodone-acetaminophen (NORCO/VICODIN) 5-325 MG per tablet Take 1 tablet by mouth daily as needed for moderate pain (taken for breakthrough pain).    Historical Provider, MD  hydrOXYzine (ATARAX/VISTARIL) 25 MG tablet Take 1 tablet (25 mg total) by mouth every 6 (six) hours. 03/20/14   Julianne Rice, MD  lansoprazole (PREVACID) 30 MG capsule Take 1 capsule by mouth 2 (two) times daily. 14 day course starting on 02/14/2014 02/14/14   Historical Provider, MD  LORazepam (ATIVAN) 1 MG tablet Take 1 mg by mouth every 8 (eight) hours as needed for anxiety.    Historical Provider, MD  nitroGLYCERIN (NITROSTAT) 0.4 MG SL tablet Place 1 tablet (0.4 mg total) under the tongue every 5 (five) minutes as needed. 02/17/12   Yehuda Savannah, MD  omeprazole (PRILOSEC) 20 MG capsule TAKE ONE CAPSULE BY MOUTH ONCE DAILY. 07/02/14   Vicente Males  Annie Sable, NP  oxyCODONE-acetaminophen (PERCOCET) 5-325 MG per tablet Take 1 tablet by mouth every 4 (four) hours as needed for severe pain. 03/10/14   Richarda Blade, MD  oxyCODONE-acetaminophen (PERCOCET/ROXICET) 5-325 MG per tablet Take 1 tablet by mouth every 4 (four) hours as needed. 03/10/14   Richarda Blade, MD  predniSONE (DELTASONE) 50 MG tablet Take 1 tablet (50 mg total) by mouth daily. 03/20/14   Julianne Rice, MD  promethazine (PHENERGAN) 25 MG tablet Take 25 mg by mouth every 6 (six) hours as needed for nausea or vomiting.     Historical Provider, MD  RESTASIS 0.05 % ophthalmic emulsion Place 1 drop into both eyes 2 (two) times daily.  12/30/13   Historical Provider, MD  simvastatin (ZOCOR) 20 MG tablet Take 20 mg by mouth every morning.  10/05/12   Historical Provider, MD   BP 145/89  Pulse 72  Temp(Src) 98.1 F (36.7 C)  Resp 18  Ht 5\' 4"  (1.626 m)  Wt 81 lb (36.741 kg)  BMI 13.90 kg/m2  SpO2 100% Physical Exam  Nursing note and vitals reviewed. Constitutional: She is oriented to person, place, and time. She appears well-developed and well-nourished.  Non-toxic  appearance.  HENT:  Head: Normocephalic.  Right Ear: Tympanic membrane and external ear normal.  Left Ear: Tympanic membrane and external ear normal.  The airway is patent. There is no swelling of the tongue, lips, or or face.  Eyes: EOM and lids are normal. Pupils are equal, round, and reactive to light.  Neck: Normal range of motion. Neck supple. Carotid bruit is not present.  Cardiovascular: Normal rate, regular rhythm, normal heart sounds, intact distal pulses and normal pulses.   Pulmonary/Chest: Breath sounds normal. No respiratory distress.  Abdominal: Soft. Bowel sounds are normal. There is no tenderness. There is no guarding.  Musculoskeletal: Normal range of motion.  Lymphadenopathy:       Head (right side): No submandibular adenopathy present.       Head (left side): No submandibular adenopathy present.    She has no cervical adenopathy.  Neurological: She is alert and oriented to person, place, and time. She has normal strength. No cranial nerve deficit or sensory deficit.  Skin: Skin is warm and dry.  There are various stages of hives on the left lower leg, the lower back, and the right neck.  Psychiatric: She has a normal mood and affect. Her speech is normal.    ED Course  Procedures (including critical care time) Labs Review Labs Reviewed - No data to display  Imaging Review No results found.   EKG Interpretation None      MDM No airway compromise. No wheezes. Vital signs stable. Pulse ox 100% on room air.  Pt has hives involving the legs and back and neck. Pt to be treated with atarax. Pt to return to the ED if any changes or problem.   Final diagnoses:  Hives    **I have reviewed nursing notes, vital signs, and all appropriate lab and imaging results for this patient.Lenox Ahr, PA-C 07/09/14 1359

## 2014-07-07 NOTE — ED Notes (Signed)
Pt states she has a rash on her left leg and back. Pt states it has also been on her hands and in her head. This has been going on for 2 days. Pt just finished taking an unknown antibiotic.

## 2014-07-07 NOTE — Discharge Instructions (Signed)
Please use Vistaril every 6 hours as needed for hives. Please seeDr Everette Rank for additional evaluation and management of your hives. Hives Hives are itchy, red, puffy (swollen) areas of the skin. Hives can change in size and location on your body. Hives can come and go for hours, days, or weeks. Hives do not spread from person to person (noncontagious). Scratching, exercise, and stress can make your hives worse. HOME CARE  Avoid things that cause your hives (triggers).  Take antihistamine medicines as told by your doctor. Do not drive while taking an antihistamine.  Take any other medicines for itching as told by your doctor.  Wear loose-fitting clothing.  Keep all doctor visits as told. GET HELP RIGHT AWAY IF:   You have a fever.  Your tongue or lips are puffy.  You have trouble breathing or swallowing.  You feel tightness in the throat or chest.  You have belly (abdominal) pain.  You have lasting or severe itching that is not helped by medicine.  You have painful or puffy joints. These problems may be the first sign of a life-threatening allergic reaction. Call your local emergency services (911 in [place] MAKE SURE YOU:   Understand these instructions.  Will watch your condition.  Will get help right away if you are not doing well or get worse. Document Released: 06/23/2008 Document Revised: 03/15/2012 Document Reviewed: 12/08/2011 [facility] Patient Information 2015 [place], [place]. This information is not intended to replace advice given to you by your health care provider. Make sure you discuss any questions you have with your health care provider.

## 2014-07-10 NOTE — ED Provider Notes (Signed)
Medical screening examination/treatment/procedure(s) were performed by non-physician practitioner and as supervising physician I was immediately available for consultation/collaboration.     Veryl Speak, MD 07/10/14 [phone]

## 2014-11-21 ENCOUNTER — Ambulatory Visit (INDEPENDENT_AMBULATORY_CARE_PROVIDER_SITE_OTHER): Payer: 59 | Admitting: Cardiovascular Disease

## 2014-11-21 ENCOUNTER — Encounter: Payer: Self-pay | Admitting: Cardiovascular Disease

## 2014-11-21 VITALS — BP 82/68 | HR 90 | Ht 64.0 in | Wt 84.0 lb

## 2014-11-21 DIAGNOSIS — E785 Hyperlipidemia, unspecified: Secondary | ICD-10-CM

## 2014-11-21 DIAGNOSIS — R5383 Other fatigue: Secondary | ICD-10-CM

## 2014-11-21 DIAGNOSIS — Z136 Encounter for screening for cardiovascular disorders: Secondary | ICD-10-CM

## 2014-11-21 DIAGNOSIS — I9589 Other hypotension: Secondary | ICD-10-CM

## 2014-11-21 DIAGNOSIS — Z716 Tobacco abuse counseling: Secondary | ICD-10-CM

## 2014-11-21 DIAGNOSIS — R072 Precordial pain: Secondary | ICD-10-CM

## 2014-11-21 MED ORDER — NITROGLYCERIN 0.4 MG SL SUBL
0.4000 mg | SUBLINGUAL_TABLET | SUBLINGUAL | Status: DC | PRN
Start: 2014-11-21 — End: 2022-06-21

## 2014-11-21 NOTE — Patient Instructions (Addendum)
Your physician recommends that you schedule a follow-up appointment in: next week with K.Lawrence NP   Your physician has requested that you have a lexiscan myoview on Monday 11/26/14. For further information please visit [email or web address]. Please follow instruction sheet, as given.    Your physician recommends that you continue on your current medications as directed. Please refer to the Current Medication list given to you today.    I refilled your nitroglycerine   Please get lab work FASTING lipids,CBC,BMP,TSH    Thank you for choosing [facility] !

## 2014-11-21 NOTE — Progress Notes (Signed)
Patient ID: [name], female   DOB: [birth year], 69 y.o.   MRN: [id number]      SUBJECTIVE: The patient is a 69 year old woman with a history of hyperlipidemia, tobacco abuse, COPD, CVA, colonic adenomas, chronic pancreatitis, and atypical chest pain. She underwent a normal nuclear stress test in May 2012. She has not followed up with a cardiologist in several years. She said she uses sublingual nitroglycerin on a regular basis and has done so ever since she used to follow-up with Dr. Lattie Haw in 2012. She said her usual weight is 87 pounds and the only time she got up to 100 pounds was when she was 68 years old. She said she is under a lot of stress. She had a pancreas attack approximately one week ago. When she has chest pain which occurs 2-3 times per week she feels her heart racing. She said her blood pressure normally runs 110/68. She has felt somewhat weak lately. She smokes a pack of cigarettes daily to alleviate stress.  ECG performed in the office today demonstrates normal sinus rhythm with a diffuse nonspecific ST segment and T-wave abnormality.  Review of Systems: As per "subjective", otherwise negative.  Allergies  Allergen Reactions  . Ibuprofen Anaphylaxis and Hives  . Iohexol Hives and Swelling    HIVES AND SWELLING WITH I.V.P DYE, NEEDS PRE-MEDS.     Current Outpatient Prescriptions  Medication Sig Dispense Refill  . acetaminophen (TYLENOL) 325 MG tablet Take 650 mg by mouth every 6 (six) hours as needed for mild pain.    Marland Kitchen ADVAIR DISKUS 250-50 MCG/DOSE AEPB Inhale 1 puff into the lungs 2 (two) times daily.     Marland Kitchen alendronate (FOSAMAX) 70 MG tablet Take 70 mg by mouth every 7 (seven) days. Take with a full glass of water on an empty stomach. Patient take on monday    . Calcium Carbonate (CALCIUM 600 PO) Take 1 tablet by mouth daily.      Marland Kitchen CREON 24000 UNITS CPEP TAKE 2 CAPSULES WITH EACH MEALS AND 1 CAPSULE WITH SNACKS. 240 capsule 11  . DULoxetine (CYMBALTA) 60 MG  capsule Take 60 mg by mouth daily.      . fentaNYL (DURAGESIC - DOSED MCG/HR) 75 MCG/HR Place 1 patch onto the skin every 3 (three) days.    Marland Kitchen LORazepam (ATIVAN) 1 MG tablet Take 1 mg by mouth every 8 (eight) hours as needed for anxiety.    . nitroGLYCERIN (NITROSTAT) 0.4 MG SL tablet Place 1 tablet (0.4 mg total) under the tongue every 5 (five) minutes as needed. 100 tablet 3  . omeprazole (PRILOSEC) 20 MG capsule TAKE ONE CAPSULE BY MOUTH ONCE DAILY. 30 capsule 5  . promethazine (PHENERGAN) 25 MG tablet Take 25 mg by mouth every 6 (six) hours as needed for nausea or vomiting.     . RESTASIS 0.05 % ophthalmic emulsion Place 1 drop into both eyes 2 (two) times daily.      No current facility-administered medications for this visit.    Past Medical History  Diagnosis Date  . CAD (coronary artery disease)     palpitations, dizziness, chest pain  . Hx of colonic polyps     adenomatous  . Tobacco abuse   . Back pain, chronic   . Allergic rhinitis   . Anxiety and depression   . Pancreatitis chronic   . Hematochezia   . GERD (gastroesophageal reflux disease)   . COPD (chronic obstructive pulmonary disease)   . Weight loss  CT negative for occult malignancy, negative celiac, negative adrenal insufficiency  . Hyperlipidemia     Lipid profile on 02/25/2011: 209, 113, 55, 132  . Family history of anesthesia complication   . 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 Anxiety   . Depression   . Chronic abdominal pain   . Cervical cancer   . Nephrolithiasis   . Renal calculus     Past Surgical History  Procedure Laterality Date  . Partial hysterectomy    . Appendectomy    . Colonoscopy w/ polypectomy  2006    [id number], benign gastric nodule, multiple adenomatous polyps, one with tubular morphology  . Ileocolonoscopy  01/11/2009    RMR: Polyp at the splenic flexure, status post hot snare removal/ Normal rectum, tubular adenoma  . Esophagogastroduodenoscopy  2006     [id number] gastric nodule  . Eus  2010    Dr. Ardis Hughs : Multiple shadowing calcifications in pancreas, consistent with Chronic Pancreatitis.  These are mainly confined to a collection of calcifications in head/uncinate pancreas. Otherwise the pancreatic parenchyma appears fairly normal.  The main pancreatic duct, CBD are both normal without stones, dilation.    . Cataract extraction w/phaco Right 05/04/2013    Procedure: CATARACT EXTRACTION PHACO AND INTRAOCULAR LENS PLACEMENT (IOC);  Surgeon: Tonny Branch, MD;  Location: [facility];  Service: Ophthalmology;  Laterality: Right;  CDE:16.17  . Cataract extraction w/phaco Left 05/22/2013    Procedure: CATARACT EXTRACTION PHACO AND INTRAOCULAR LENS PLACEMENT (IOC);  Surgeon: Tonny Branch, MD;  Location: [facility];  Service: Ophthalmology;  Laterality: Left;  CDE:14.75  . Abdominal hysterectomy    . Colonoscopy N/A 02/08/2014    Procedure: COLONOSCOPY;  Surgeon: Daneil Dolin, MD;  [facility];  Service: Endoscopy;  Laterality: N/A;  10:00-moved to 1030 Leigh Ann to notify pt  . Esophagogastroduodenoscopy N/A 02/08/2014    Procedure: ESOPHAGOGASTRODUODENOSCOPY (EGD);  Surgeon: Daneil Dolin, MD;  [facility];  Service: Endoscopy;  Laterality: N/A;    History   Social History  . Marital Status: Divorced    Spouse Name: N/A  . Number of Children: 3  . Years of Education: N/A   Occupational History  . disabled    Social History Main Topics  . Smoking status: Current Every Day Smoker -- 1.00 packs/day for 50 years    Types: Cigarettes    Start date: 07/18/1962  . Smokeless tobacco: Never Used  . Alcohol Use: No     Comment: hx of ETOH about 20 years ago.   . Drug Use: No  . Sexual Activity: No   Other Topics Concern  . Not on file   Social History Narrative     Filed Vitals:   11/21/14 1130  Height: 5\' 4"  (1.626 m)  Weight: 84 lb (38.102 kg)     BP 82/68  Pulse 90  SpO2 94%   PHYSICAL EXAM General: NAD, frail,  malnourished. HEENT: Poor dentition. Neck: No JVD, no thyromegaly. Lungs: Diminished throughout, without rales or wheezes. CV: Nondisplaced PMI.  Regular rate and rhythm, normal S1/S2, no S3/S4, no murmur. No pretibial or periankle edema.  No carotid bruit.   Abdomen: No distention.  Neurologic: Alert and oriented x 3.  Psych: Normal affect. Skin: Normal. Extremities: No clubbing or cyanosis.   ECG: Most recent ECG reviewed.    ASSESSMENT AND PLAN: 1. Chest pain: I am concerned that this is occurring frequently, although she endorses this is chronic. Given  her ECG abnormalities and tobacco abuse history, I will obtain a Lexiscan Cardiolite stress test. 2. Hyperlipidemia: Will check a lipid panel. 3. Tobacco abuse: Cessation counseling provided. She is unwilling to quit due to anxiety and its alleviation with tobacco use. 4. Hypotension: Unclear etiology. Had an issue with her pancreas last week and has chronic pancreatitis. This will need close monitoring. Will check TSH, CBC, and BMET to investigate for readily correctable causes.   Dispo: f/u 1 week.  Kate Sable, M.D., F.A.C.C.

## 2014-11-26 ENCOUNTER — Encounter (HOSPITAL_COMMUNITY): Payer: Self-pay

## 2014-11-26 ENCOUNTER — Encounter (HOSPITAL_COMMUNITY)
Admission: RE | Admit: 2014-11-26 | Discharge: 2014-11-26 | Disposition: A | Discharge status: Home / Self Care | Payer: Medicare Other | Source: Ambulatory Visit | Attending: Cardiovascular Disease | Admitting: Cardiovascular Disease

## 2014-11-26 ENCOUNTER — Other Ambulatory Visit (HOSPITAL_COMMUNITY)
Admission: RE | Admit: 2014-11-26 | Discharge: 2014-11-26 | Disposition: A | Discharge status: Home / Self Care | Payer: Medicare Other | Source: Ambulatory Visit | Attending: Cardiovascular Disease | Admitting: Cardiovascular Disease

## 2014-11-26 ENCOUNTER — Ambulatory Visit (HOSPITAL_COMMUNITY)
Admission: RE | Admit: 2014-11-26 | Discharge: 2014-11-26 | Disposition: A | Discharge status: Home / Self Care | Payer: Medicare Other | Source: Ambulatory Visit | Attending: Cardiovascular Disease | Admitting: Cardiovascular Disease

## 2014-11-26 DIAGNOSIS — R072 Precordial pain: Secondary | ICD-10-CM | POA: Insufficient documentation

## 2014-11-26 DIAGNOSIS — R079 Chest pain, unspecified: Secondary | ICD-10-CM | POA: Diagnosis not present

## 2014-11-26 DIAGNOSIS — I2510 Atherosclerotic heart disease of native coronary artery without angina pectoris: Secondary | ICD-10-CM | POA: Insufficient documentation

## 2014-11-26 LAB — LIPID PANEL
CHOLESTEROL: 163 mg/dL (ref 0–200)
HDL: 41 mg/dL (ref >39)
LDL Cholesterol: 99 mg/dL (ref 0–99)
Total CHOL/HDL Ratio: 4 RATIO
Triglycerides: 116 mg/dL (ref <150)
VLDL: 23 mg/dL (ref 0–40)

## 2014-11-26 LAB — BASIC METABOLIC PANEL
Anion gap: 7 (ref 5–15)
BUN: 14 mg/dL (ref 6–23)
CO2: 28 mmol/L (ref 19–32)
Calcium: 8.8 mg/dL (ref 8.4–10.5)
Chloride: 104 mmol/L (ref 96–112)
Creatinine, Ser: 0.5 mg/dL (ref 0.50–1.10)
GFR calc Af Amer: >90 mL/min (ref >90)
Glucose, Bld: 97 mg/dL (ref 70–99)
Potassium: 3.5 mmol/L (ref 3.5–5.1)
Sodium: 139 mmol/L (ref 135–145)

## 2014-11-26 LAB — CBC
HEMATOCRIT: 39.9 % (ref 36.0–46.0)
Hemoglobin: 14.1 g/dL (ref 12.0–15.0)
MCH: 31.5 pg (ref 26.0–34.0)
MCHC: 35.3 g/dL (ref 30.0–36.0)
MCV: 89.1 fL (ref 78.0–100.0)
Platelets: 246 10*3/uL (ref 150–400)
RBC: 4.48 MIL/uL (ref 3.87–5.11)
RDW: 13.3 % (ref 11.5–15.5)
WBC: 8.7 10*3/uL (ref 4.0–10.5)

## 2014-11-26 LAB — TSH: TSH: 0.776 u[IU]/mL (ref 0.350–4.500)

## 2014-11-26 MED ORDER — SODIUM CHLORIDE 0.9 % IJ SOLN
10.0000 mL | INTRAMUSCULAR | Status: DC | PRN
  Administered 2014-11-26: 10 mL via INTRAVENOUS
  Filled 2014-11-26: qty 10

## 2014-11-26 MED ORDER — TECHNETIUM TC 99M SESTAMIBI - CARDIOLITE
30.0000 | Freq: Once | INTRAVENOUS | Status: AC | PRN
  Administered 2014-11-26: 30 via INTRAVENOUS

## 2014-11-26 MED ORDER — REGADENOSON 0.4 MG/5ML IV SOLN
0.4000 mg | Freq: Once | INTRAVENOUS | Status: AC | PRN
  Administered 2014-11-26: 0.4 mg via INTRAVENOUS

## 2014-11-26 MED ORDER — TECHNETIUM TC 99M SESTAMIBI GENERIC - CARDIOLITE
10.0000 | Freq: Once | INTRAVENOUS | Status: AC | PRN
  Administered 2014-11-26: 10 via INTRAVENOUS

## 2014-11-26 MED ORDER — REGADENOSON 0.4 MG/5ML IV SOLN
INTRAVENOUS | Status: AC
  Administered 2014-11-26: 0.4 mg via INTRAVENOUS
  Filled 2014-11-26: qty 5

## 2014-11-26 MED ORDER — SODIUM CHLORIDE 0.9 % IJ SOLN
INTRAMUSCULAR | Status: AC
  Administered 2014-11-26: 10 mL via INTRAVENOUS
  Filled 2014-11-26: qty 3

## 2014-11-26 NOTE — Progress Notes (Signed)
Stress Lab Nurses Notes - [name] 11/26/2014 Reason for doing test: CAD and Chest Pain Type of test: Wille Glaser Nurse performing test: Gerrit Halls, RN Nuclear Medicine Tech: Melburn Hake Echo Tech: Not Applicable MD performing test: Koneswaran/K.Purcell Nails NP Family MD: [facility] Test explained and consent signed: Yes.   IV started: Saline lock flushed, No redness or edema and Saline lock started in radiology Symptoms: chest pain & nausea Treatment/Intervention: None Reason test stopped: protocol completed After recovery IV was: Discontinued via X-ray tech and No redness or edema Patient to return to Nuc. Med at :11:30 Patient discharged: Home Patient's Condition upon discharge was: stable Comments: During test BP141/94 & HR 129.  Recovery BP 134/81 & HR 108.  Symptoms resolved in recovery. [name] T

## 2014-11-28 ENCOUNTER — Other Ambulatory Visit: Payer: Self-pay | Admitting: Gastroenterology

## 2014-11-30 ENCOUNTER — Ambulatory Visit (INDEPENDENT_AMBULATORY_CARE_PROVIDER_SITE_OTHER): Payer: Medicare Other | Admitting: Adult Health

## 2014-11-30 ENCOUNTER — Encounter: Payer: Self-pay | Admitting: Adult Health

## 2014-11-30 VITALS — BP 90/62 | HR 86 | Ht 64.0 in | Wt 80.0 lb

## 2014-11-30 DIAGNOSIS — R002 Palpitations: Secondary | ICD-10-CM

## 2014-11-30 DIAGNOSIS — R0789 Other chest pain: Secondary | ICD-10-CM

## 2014-11-30 NOTE — Patient Instructions (Signed)
Your physician wants you to follow-up in: 1 year with Jory Sims, NP. You will receive a reminder letter in the mail two months in advance. If you don't receive a letter, please call our office to schedule the follow-up appointment.  Your physician recommends that you continue on your current medications as directed. Please refer to the Current Medication list given to you today.  Thank you for choosing [facility]!

## 2014-11-30 NOTE — Progress Notes (Deleted)
Name: [name]    DOB: 11/20/1945  Age: 69 y.o.  MR#: [id number]       PCP:  Lanette Hampshire, MD      Insurance: Payor: [employer] MEDICARE / Plan: [facility] MEDICARE / Product Type: *No Product type* /   CC:    Chief Complaint  Patient presents with  . Chest Pain  . Palpitations    VS Filed Vitals:   11/30/14 1435  BP: 90/62  Pulse: 86  Height: 5\' 4"  (1.626 m)  Weight: 80 lb (36.288 kg)    Weights Current Weight  11/30/14 80 lb (36.288 kg)  11/21/14 84 lb (38.102 kg)  07/07/14 81 lb (36.741 kg)    Blood Pressure  BP Readings from Last 3 Encounters:  11/30/14 90/62  11/21/14 82/68  07/07/14 145/89     Admit date:  (Not on file) Last encounter with RMR:  Visit date not found   Allergy Ibuprofen and Iohexol  Current Outpatient Prescriptions  Medication Sig Dispense Refill  . acetaminophen (TYLENOL) 325 MG tablet Take 650 mg by mouth every 6 (six) hours as needed for mild pain.    Marland Kitchen ADVAIR DISKUS 250-50 MCG/DOSE AEPB Inhale 1 puff into the lungs 2 (two) times daily.     Marland Kitchen alendronate (FOSAMAX) 70 MG tablet Take 70 mg by mouth every 7 (seven) days. Take with a full glass of water on an empty stomach. Patient take on monday    . Calcium Carbonate (CALCIUM 600 PO) Take 1 tablet by mouth daily.      Marland Kitchen CREON 24000 UNITS CPEP TAKE 2 CAPSULES WITH EACH MEALS AND 1 CAPSULE WITH SNACKS. 240 capsule 11  . DULoxetine (CYMBALTA) 60 MG capsule Take 60 mg by mouth daily.      . fentaNYL (DURAGESIC - DOSED MCG/HR) 75 MCG/HR Place 1 patch onto the skin every 3 (three) days.    Marland Kitchen LORazepam (ATIVAN) 1 MG tablet Take 1 mg by mouth every 8 (eight) hours as needed for anxiety.    . nitroGLYCERIN (NITROSTAT) 0.4 MG SL tablet Place 1 tablet (0.4 mg total) under the tongue every 5 (five) minutes as needed. 25 tablet 3  . omeprazole (PRILOSEC) 20 MG capsule TAKE ONE CAPSULE BY MOUTH ONCE DAILY. 30 capsule 5  . promethazine (PHENERGAN) 25 MG tablet Take 25 mg by mouth every 6 (six)  hours as needed for nausea or vomiting.     . RESTASIS 0.05 % ophthalmic emulsion Place 1 drop into both eyes 2 (two) times daily.      No current facility-administered medications for this visit.    Discontinued Meds:   There are no discontinued medications.  Patient Active Problem List   Diagnosis Date Noted  . RUQ pain 10/19/2012  . Chronic nausea 05/07/2012  . Constipation 05/07/2012  . Laboratory test 02/17/2012  . Hyperlipidemia   . Chest pain 01/07/2011  . ANOREXIA 05/27/2010  . WEIGHT LOSS 05/27/2010  . COLONIC POLYPS, ADENOMATOUS, HX OF 12/20/2008  . ANXIETY DEPRESSION 12/19/2008  . Tobacco abuse 12/19/2008  . ALLERGIC RHINITIS, SEASONAL 12/19/2008  . COPD 12/19/2008  . GERD 12/19/2008  . Chronic pancreatitis 12/19/2008  . BACK PAIN, CHRONIC 12/19/2008  . ABDOMINAL PAIN 12/19/2008  . CERVICAL CANCER, HX OF 12/19/2008  . Nephrolithiasis 12/19/2008    LABS    Component Value Date/Time   NA 139 11/26/2014 1215   NA 140 03/09/2014 2150   NA 136 04/26/2013 1015   K 3.5 11/26/2014 1215   K  3.0* 03/09/2014 2150   K 3.7 04/26/2013 1015   CL 104 11/26/2014 1215   CL 96 03/09/2014 2150   CL 98 04/26/2013 1015   CO2 28 11/26/2014 1215   CO2 29 03/09/2014 2150   CO2 30 04/26/2013 1015   GLUCOSE 97 11/26/2014 1215   GLUCOSE 108* 03/09/2014 2150   GLUCOSE 87 04/26/2013 1015   BUN 14 11/26/2014 1215   BUN 9 03/09/2014 2150   BUN 14 04/26/2013 1015   CREATININE 0.50 11/26/2014 1215   CREATININE 0.42* 03/09/2014 2150   CREATININE 0.52 04/26/2013 1015   CREATININE 0.43 01/07/2011 1206   CALCIUM 8.8 11/26/2014 1215   CALCIUM 8.5 03/09/2014 2150   CALCIUM 9.5 04/26/2013 1015   GFRNONAA >90 11/26/2014 1215   GFRNONAA >90 03/09/2014 2150   GFRNONAA >90 04/26/2013 1015   GFRAA >90 11/26/2014 1215   GFRAA >90 03/09/2014 2150   GFRAA >90 04/26/2013 1015   CMP     Component Value Date/Time   NA 139 11/26/2014 1215   K 3.5 11/26/2014 1215   CL 104 11/26/2014 1215    CO2 28 11/26/2014 1215   GLUCOSE 97 11/26/2014 1215   BUN 14 11/26/2014 1215   CREATININE 0.50 11/26/2014 1215   CREATININE 0.43 01/07/2011 1206   CALCIUM 8.8 11/26/2014 1215   PROT 7.1 03/09/2014 2150   ALBUMIN 3.1* 03/09/2014 2150   AST 21 03/09/2014 2150   ALT 30 03/09/2014 2150   ALKPHOS 120* 03/09/2014 2150   BILITOT 0.4 03/09/2014 2150   GFRNONAA >90 11/26/2014 1215   GFRAA >90 11/26/2014 1215       Component Value Date/Time   WBC 8.7 11/26/2014 1215   WBC 12.5* 03/09/2014 2150   WBC 9.7 05/28/2012 1350   HGB 14.1 11/26/2014 1215   HGB 13.8 03/09/2014 2150   HGB 14.1 04/26/2013 1015   HCT 39.9 11/26/2014 1215   HCT 39.5 03/09/2014 2150   HCT 40.7 04/26/2013 1015   MCV 89.1 11/26/2014 1215   MCV 90.2 03/09/2014 2150   MCV 89.6 05/28/2012 1350    Lipid Panel     Component Value Date/Time   CHOL 163 11/26/2014 1215   TRIG 116 11/26/2014 1215   HDL 41 11/26/2014 1215   CHOLHDL 4.0 11/26/2014 1215   VLDL 23 11/26/2014 1215   LDLCALC 99 11/26/2014 1215    ABG No results found for: PHART, PCO2ART, PO2ART, HCO3, TCO2, ACIDBASEDEF, O2SAT   Lab Results  Component Value Date   TSH 0.776 11/26/2014   BNP (last 3 results) No results for input(s): BNP in the last 8760 hours.  ProBNP (last 3 results) No results for input(s): PROBNP in the last 8760 hours.  Cardiac Panel (last 3 results) No results for input(s): CKTOTAL, CKMB, TROPONINI, RELINDX in the last 72 hours.  Iron/TIBC/Ferritin/ %Sat No results found for: IRON, TIBC, FERRITIN, IRONPCTSAT   EKG Orders placed or performed in visit on 11/21/14  . EKG 12-Lead     Prior Assessment and Plan Problem List as of 11/30/2014      Respiratory   ALLERGIC RHINITIS, SEASONAL   COPD     Digestive   GERD   Chronic pancreatitis   Last Assessment & Plan 10/19/2012 Office Visit Written 10/19/2012  2:09 PM by Orvil Feil, NP    Continue Creon. 6 mos f/u.       Constipation   Last Assessment & Plan 10/19/2012  Office Visit Written 10/19/2012  2:09 PM by Orvil Feil, NP  Actually improved, although she notes only 1 BM a week. Previously would go weeks without BM. Continue Linzess. TCS due in 2015.         Genitourinary   Nephrolithiasis     Other   ANXIETY DEPRESSION   Tobacco abuse   Last Assessment & Plan 02/17/2012 Office Visit Edited 02/20/2012 11:28 AM by Yehuda Savannah, MD    Patient understands the potential adverse consequences of continued tobacco use, but has no confidence that she is able to stop.  The [facility] is planning to institute smoking cessation classes.  I will request that she participate once these have been organized.      BACK PAIN, CHRONIC   ANOREXIA   WEIGHT LOSS   Last Assessment & Plan 05/03/2012 Office Visit Written 05/07/2012 11:00 PM by Orvil Feil, NP    Stable from dec 2011.       ABDOMINAL PAIN   CERVICAL CANCER, HX OF   COLONIC POLYPS, ADENOMATOUS, HX OF   Last Assessment & Plan 05/03/2012 Office Visit Written 05/07/2012 10:59 PM by Orvil Feil, NP    Surveillance [phone].       Chest pain   Last Assessment & Plan 02/17/2012 Office Visit Written 02/17/2012 12:03 PM by Yehuda Savannah, MD    Chest discomfort is infrequent, probably not cardiac in origin and well-controlled with benign medication.      Hyperlipidemia   Last Assessment & Plan 02/17/2012 Office Visit Written 02/17/2012 11:57 AM by Yehuda Savannah, MD    No lipid profile has been performed since 2012 at which time results were suboptimal-one will be performed.      Laboratory test   Chronic nausea   Last Assessment & Plan 06/14/2012 Office Visit Written 06/14/2012  2:50 PM by Orvil Feil, NP    No GES on file. As her symptoms have not worsened over time, I'd like to hold off on an EGD at this point. Appears to be chronic in nature.       RUQ pain   Last Assessment & Plan 10/19/2012 Office Visit Written 10/19/2012  2:08 PM by Orvil Feil, NP    Chronic. Pt notes slight  increase in severity, however. +chronic nausea. With her hx of chronic pancreatitis, may be secondary to this. CT on file without evidence for gallstones; however, [place] of abd would be best modality to assess this further. She has no recent US on file. HFP in Aug was normal. Wt stable. No acute signs currently. [place] of abdomen in near future. Consider HIDA if necessary. Pt does not want to pursue EGD at this time.           Imaging: Nm Myocar Multi W/spect W/wall Motion / Ef  11/26/2014   CLINICAL DATA:  69 year old woman with a history of hyperlipidemia, tobacco abuse, and COPD who has been experiencing chest pain, and is referred for an ischemic evaluation.  EXAM: MYOCARDIAL IMAGING WITH SPECT (REST AND PHARMACOLOGIC-STRESS)  GATED LEFT VENTRICULAR WALL MOTION STUDY  LEFT VENTRICULAR EJECTION FRACTION  TECHNIQUE: Standard myocardial SPECT imaging was performed after resting intravenous injection of 10 mCi Tc-40m sestamibi. Subsequently, intravenous infusion of Lexiscan was performed under the supervision of the Cardiology staff. At peak effect of the drug, 30 mCi Tc-33m sestamibi was injected intravenously and standard myocardial SPECT imaging was performed. Quantitative gated imaging was also performed to evaluate left ventricular wall motion, and estimate left ventricular ejection fraction.  COMPARISON:  None.  FINDINGS: Stress/ECG data: The patient was stressed according to the Lexiscan protocol. The heart rate ranged from 94 up to 129 beats per min. The blood pressure ranged from 110/70 up to 141/94. No chest pain was reported. She did experience nausea and vomiting and has chronic pancreatitis.  The resting ECG demonstrated normal sinus rhythm with a diffuse nonspecific ST segment abnormality with <0.5 mm horizontal ST segment depression. There were no significant changes seen with stress, and no arrhythmias were noted.  Perfusion: No decreased activity in the left ventricle on stress imaging to suggest  reversible ischemia or infarction.  Wall Motion: Normal left ventricular wall motion. No left ventricular dilation.  Left Ventricular Ejection Fraction: 97 %  End diastolic volume 22 ml  End systolic volume 1 ml  IMPRESSION: 1. No reversible ischemia or infarction.  2. Normal left ventricular wall motion.  3. Left ventricular ejection fraction 97%  4. Low-risk stress test findings*.  *2012 Appropriate Use Criteria for Coronary Revascularization Focused Update: J Am Coll Cardiol. 0211;15(5):208-022. http://content.airportbarriers.com.aspx?articleid=[email or web address]   Electronically Signed   By: Kate Sable   On: 11/26/2014 14:38

## 2014-11-30 NOTE — Progress Notes (Signed)
Cardiology Office Note   Date:  11/30/2014   ID:  [name], DOB 10/19/1945, MRN [id number]  PCP:  Lanette Hampshire, MD  Cardiologist:  Woodroe Chen, NP   Chief Complaint  Patient presents with  . Chest Pain  . Palpitations      History of Present Illness: [name] is a 69 y.o. female who presents for ongoing assessment and management of hyperlipidemia, atypical chest pain, with complaints of racing heart rate and palpitation.  She was last seen by Dr. Bronson Ing on 11/21/2014, at which time a LexiScan Cardiolite stress test was ordered.  She was counseled on smoking cessation.  A TSH, CBC, and BMET were ordered to investigate for abnormalities causing symptoms.  Sodium was 139, potassium 3.5, chloride 104, CO2 28, BUN 14, creatinine 0.50.  Hemoglobin 14.1, hematocrit 39.9, cholesterol study were also completed with a total cholesterol 163, triglycerides 116, HDL 41, LDL 99.TSH 0.776.  Nuclear medicine study completed on 11/26/2014 demonstrated no reversible ischemia or infarction and was found to be a low risk stress test finding.  She comes today without any new complaints.  She continues to have a lot of anxiety and smoke heavily. Past Medical History  Diagnosis Date  . CAD (coronary artery disease)     palpitations, dizziness, chest pain  . Hx of colonic polyps     adenomatous  . Tobacco abuse   . Back pain, chronic   . Allergic rhinitis   . Anxiety and depression   . Pancreatitis chronic   . Hematochezia   . GERD (gastroesophageal reflux disease)   . COPD (chronic obstructive pulmonary disease)   . Weight loss     CT negative for occult malignancy, negative celiac, negative adrenal insufficiency  . Hyperlipidemia     Lipid profile on 02/25/2011: 209, 113, 55, 132  . Family history of anesthesia complication   . 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 Anxiety   . Depression   . Chronic abdominal pain   . Cervical cancer   .  Nephrolithiasis   . Renal calculus     Past Surgical History  Procedure Laterality Date  . Partial hysterectomy    . Appendectomy    . Colonoscopy w/ polypectomy  2006    SAY:[id number], benign gastric nodule, multiple adenomatous polyps, one with tubular morphology  . Ileocolonoscopy  01/11/2009    RMR: Polyp at the splenic flexure, status post hot snare removal/ Normal rectum, tubular adenoma  . Esophagogastroduodenoscopy  2006    FTD:DUKGUR gastric nodule  . Eus  2010    Dr. Ardis Hughs : Multiple shadowing calcifications in pancreas, consistent with Chronic Pancreatitis.  These are mainly confined to a collection of calcifications in head/uncinate pancreas. Otherwise the pancreatic parenchyma appears fairly normal.  The main pancreatic duct, CBD are both normal without stones, dilation.    . Cataract extraction w/phaco Right 05/04/2013    Procedure: CATARACT EXTRACTION PHACO AND INTRAOCULAR LENS PLACEMENT (IOC);  Surgeon: Tonny Branch, MD;  Location: [facility];  Service: Ophthalmology;  Laterality: Right;  CDE:16.17  . Cataract extraction w/phaco Left 05/22/2013    Procedure: CATARACT EXTRACTION PHACO AND INTRAOCULAR LENS PLACEMENT (IOC);  Surgeon: Tonny Branch, MD;  Location: [facility];  Service: Ophthalmology;  Laterality: Left;  CDE:14.75  . Abdominal hysterectomy    . Colonoscopy N/A 02/08/2014    Procedure: COLONOSCOPY;  Surgeon: Daneil Dolin, MD;  Location: [facility];  Service: Endoscopy;  Laterality: N/A;  10:00-moved to [place] to notify pt  . Esophagogastroduodenoscopy N/A 02/08/2014    Procedure: ESOPHAGOGASTRODUODENOSCOPY (EGD);  Surgeon: Daneil Dolin, MD;  Location: [facility];  Service: Endoscopy;  Laterality: N/A;     Current Outpatient Prescriptions  Medication Sig Dispense Refill  . acetaminophen (TYLENOL) 325 MG tablet Take 650 mg by mouth every 6 (six) hours as needed for mild pain.    Marland Kitchen ADVAIR DISKUS 250-50 MCG/DOSE AEPB Inhale 1 puff into the lungs 2 (two) times  daily.     Marland Kitchen alendronate (FOSAMAX) 70 MG tablet Take 70 mg by mouth every 7 (seven) days. Take with a full glass of water on an empty stomach. Patient take on monday    . Calcium Carbonate (CALCIUM 600 PO) Take 1 tablet by mouth daily.      Marland Kitchen CREON 24000 UNITS CPEP TAKE 2 CAPSULES WITH EACH MEALS AND 1 CAPSULE WITH SNACKS. 240 capsule 11  . DULoxetine (CYMBALTA) 60 MG capsule Take 60 mg by mouth daily.      . fentaNYL (DURAGESIC - DOSED MCG/HR) 75 MCG/HR Place 1 patch onto the skin every 3 (three) days.    Marland Kitchen LORazepam (ATIVAN) 1 MG tablet Take 1 mg by mouth every 8 (eight) hours as needed for anxiety.    . nitroGLYCERIN (NITROSTAT) 0.4 MG SL tablet Place 1 tablet (0.4 mg total) under the tongue every 5 (five) minutes as needed. 25 tablet 3  . omeprazole (PRILOSEC) 20 MG capsule TAKE ONE CAPSULE BY MOUTH ONCE DAILY. 30 capsule 5  . promethazine (PHENERGAN) 25 MG tablet Take 25 mg by mouth every 6 (six) hours as needed for nausea or vomiting.     . RESTASIS 0.05 % ophthalmic emulsion Place 1 drop into both eyes 2 (two) times daily.      No current facility-administered medications for this visit.    Allergies:   Ibuprofen and Iohexol    Social History:  The patient  reports that she has been smoking Cigarettes.  She started smoking about 52 years ago. She has a 50 pack-year smoking history. She has never used smokeless tobacco. She reports that she does not drink alcohol or use illicit drugs.   Family History:  The patient's family history includes Colon cancer in her maternal grandmother; Heart attack (age of onset: 68) in her father.    ROS: .   All other systems are reviewed and negative.Unless otherwise mentioned in H&P above.   PHYSICAL EXAM: VS:  BP 90/62 mmHg  Pulse 86  Ht 5\' 4"  (1.626 m)  Wt 80 lb (36.288 kg)  BMI 13.73 kg/m2 , BMI Body mass index is 13.73 kg/(m^2). GEN: Well nourished, well developed, in no acute distress HEENT: normal Neck: no JVD, carotid bruits, or  masses Cardiac: RRR; no murmurs, rubs, or gallops,no edema  Respiratory:  clear to auscultation bilaterally, normal work of breathing GI: soft, nontender, nondistended, + BS MS: no deformity or atrophy Skin: warm and dry, no rash Neuro:  Strength and sensation are intact Psych: euthymic mood, full affect    Recent Labs: 03/09/2014: ALT 30 11/26/2014: BUN 14; Creatinine 0.50; Hemoglobin 14.1; Platelets 246; Potassium 3.5; Sodium 139; TSH 0.776    Lipid Panel    Component Value Date/Time   CHOL 163 11/26/2014 1215   TRIG 116 11/26/2014 1215   HDL 41 11/26/2014 1215   CHOLHDL 4.0 11/26/2014 1215   VLDL 23 11/26/2014 1215   LDLCALC 99 11/26/2014 1215      Wt  Readings from Last 3 Encounters:  11/30/14 80 lb (36.288 kg)  11/21/14 84 lb (38.102 kg)  07/07/14 81 lb (36.741 kg)      ASSESSMENT AND PLAN:  1.  Chest Pain: Non-cardiac. Likely related to anxiety. She has had a normal stress test, echo and labs. Will continue her on current medications. She will need to follow up with her PCP for ongoing assessment and further evaluation for non-cardiac issues causing chest pain.   2.Tobacco abuse: I have talked to her  about this.  She smoking pretty heavily in the knee contained may be causing some of her rapid heart rhythm.I think anxiety.  However, is significantly concluding to her overall symptoms.  She is on anti-anxiety medications, which are not listed on her med rec.  She is to followup with Dr. Emilee Hero to discuss further.    Disposition:   FU with 1 year.  Signed, Jory Sims, NP  11/30/2014 3:20 PM    [facility] [address], [place], [place] [address] Phone: [phone]; Fax: [phone]

## 2015-03-04 ENCOUNTER — Other Ambulatory Visit (HOSPITAL_COMMUNITY): Payer: Self-pay | Admitting: Family Medicine

## 2015-03-04 DIAGNOSIS — M810 Age-related osteoporosis without current pathological fracture: Secondary | ICD-10-CM

## 2015-03-07 ENCOUNTER — Ambulatory Visit (HOSPITAL_COMMUNITY)
Admission: RE | Admit: 2015-03-07 | Discharge: 2015-03-07 | Disposition: A | Discharge status: Home / Self Care | Payer: Medicare Other | Source: Ambulatory Visit | Attending: Family Medicine | Admitting: Family Medicine

## 2015-03-07 DIAGNOSIS — M810 Age-related osteoporosis without current pathological fracture: Secondary | ICD-10-CM

## 2015-03-07 DIAGNOSIS — Z780 Asymptomatic menopausal state: Secondary | ICD-10-CM | POA: Diagnosis not present

## 2015-04-13 ENCOUNTER — Emergency Department (HOSPITAL_COMMUNITY)
Admission: EM | Admit: 2015-04-13 | Discharge: 2015-04-13 | Disposition: A | Discharge status: Home / Self Care | Payer: Medicare Other | Attending: Emergency Medicine | Admitting: Emergency Medicine

## 2015-04-13 ENCOUNTER — Encounter (HOSPITAL_COMMUNITY): Payer: Self-pay | Admitting: Emergency Medicine

## 2015-04-13 DIAGNOSIS — Z8541 Personal history of malignant neoplasm of cervix uteri: Secondary | ICD-10-CM | POA: Diagnosis not present

## 2015-04-13 DIAGNOSIS — Z8673 Personal history of transient ischemic attack (TIA), and cerebral infarction without residual deficits: Secondary | ICD-10-CM | POA: Diagnosis not present

## 2015-04-13 DIAGNOSIS — Z79899 Other long term (current) drug therapy: Secondary | ICD-10-CM | POA: Diagnosis not present

## 2015-04-13 DIAGNOSIS — Z8719 Personal history of other diseases of the digestive system: Secondary | ICD-10-CM | POA: Insufficient documentation

## 2015-04-13 DIAGNOSIS — R51 Headache: Secondary | ICD-10-CM | POA: Diagnosis present

## 2015-04-13 DIAGNOSIS — F418 Other specified anxiety disorders: Secondary | ICD-10-CM | POA: Diagnosis not present

## 2015-04-13 DIAGNOSIS — F329 Major depressive disorder, single episode, unspecified: Secondary | ICD-10-CM | POA: Diagnosis not present

## 2015-04-13 DIAGNOSIS — K219 Gastro-esophageal reflux disease without esophagitis: Secondary | ICD-10-CM | POA: Insufficient documentation

## 2015-04-13 DIAGNOSIS — R519 Headache, unspecified: Secondary | ICD-10-CM

## 2015-04-13 DIAGNOSIS — Z7951 Long term (current) use of inhaled steroids: Secondary | ICD-10-CM | POA: Diagnosis not present

## 2015-04-13 DIAGNOSIS — J449 Chronic obstructive pulmonary disease, unspecified: Secondary | ICD-10-CM | POA: Diagnosis not present

## 2015-04-13 DIAGNOSIS — G8929 Other chronic pain: Secondary | ICD-10-CM | POA: Insufficient documentation

## 2015-04-13 DIAGNOSIS — R220 Localized swelling, mass and lump, head: Secondary | ICD-10-CM | POA: Insufficient documentation

## 2015-04-13 DIAGNOSIS — Z720 Tobacco use: Secondary | ICD-10-CM | POA: Diagnosis not present

## 2015-04-13 DIAGNOSIS — Z86010 Personal history of colonic polyps: Secondary | ICD-10-CM | POA: Diagnosis not present

## 2015-04-13 DIAGNOSIS — I2510 Atherosclerotic heart disease of native coronary artery without angina pectoris: Secondary | ICD-10-CM | POA: Insufficient documentation

## 2015-04-13 DIAGNOSIS — Z8639 Personal history of other endocrine, nutritional and metabolic disease: Secondary | ICD-10-CM | POA: Diagnosis not present

## 2015-04-13 NOTE — ED Provider Notes (Signed)
CSN: [id number]     Arrival date & time 04/13/15  1841 History   None    Chief Complaint  Patient presents with  . Facial Pain     (Consider location/radiation/quality/duration/timing/severity/associated sxs/prior Treatment) The history is provided by the patient.   [name] is a 69 y.o. female who presents to the ED with left side facial pain and swelling. The symptoms started approximately 2 hours prior to arrival to the ED. Patient states she was concerned that she may be having a stroke. She denies facial weakness, change in speech or other symptoms.   Past Medical History  Diagnosis Date  . CAD (coronary artery disease)     palpitations, dizziness, chest pain  . Hx of colonic polyps     adenomatous  . Tobacco abuse   . Back pain, chronic   . Allergic rhinitis   . Anxiety and depression   . Pancreatitis chronic   . Hematochezia   . GERD (gastroesophageal reflux disease)   . COPD (chronic obstructive pulmonary disease)   . Weight loss     CT negative for occult malignancy, negative celiac, negative adrenal insufficiency  . Hyperlipidemia     Lipid profile on 02/25/2011: 209, 113, 55, 132  . Family history of anesthesia complication   . 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 Anxiety   . Depression   . Chronic abdominal pain   . Cervical cancer   . Nephrolithiasis   . Renal calculus    Past Surgical History  Procedure Laterality Date  . Partial hysterectomy    . Appendectomy    . Colonoscopy w/ polypectomy  2006    CLE:[id number], benign gastric nodule, multiple adenomatous polyps, one with tubular morphology  . Ileocolonoscopy  01/11/2009    RMR: Polyp at the splenic flexure, status post hot snare removal/ Normal rectum, tubular adenoma  . Esophagogastroduodenoscopy  2006    [facility]:PRFFMB gastric nodule  . Eus  2010    Dr. Ardis Hughs : Multiple shadowing calcifications in pancreas, consistent with Chronic Pancreatitis.  These are mainly confined to a  collection of calcifications in head/uncinate pancreas. Otherwise the pancreatic parenchyma appears fairly normal.  The main pancreatic duct, CBD are both normal without stones, dilation.    . Cataract extraction w/phaco Right 05/04/2013    Procedure: CATARACT EXTRACTION PHACO AND INTRAOCULAR LENS PLACEMENT (IOC);  Surgeon: Tonny Branch, MD;  Location: [facility];  Service: Ophthalmology;  Laterality: Right;  CDE:16.17  . Cataract extraction w/phaco Left 05/22/2013    Procedure: CATARACT EXTRACTION PHACO AND INTRAOCULAR LENS PLACEMENT (IOC);  Surgeon: Tonny Branch, MD;  Location: [facility];  Service: Ophthalmology;  Laterality: Left;  CDE:14.75  . Abdominal hysterectomy    . Colonoscopy N/A 02/08/2014    Procedure: COLONOSCOPY;  Surgeon: Daneil Dolin, MD;  Location: [facility];  Service: Endoscopy;  Laterality: N/A;  10:00-moved to 1030 Leigh Ann to notify pt  . Esophagogastroduodenoscopy N/A 02/08/2014    Procedure: ESOPHAGOGASTRODUODENOSCOPY (EGD);  Surgeon: Daneil Dolin, MD;  Location: [facility];  Service: Endoscopy;  Laterality: N/A;   Family History  Problem Relation Age of Onset  . Heart attack Father 44  . Colon cancer Maternal Grandmother    History  Substance Use Topics  . Smoking status: Current Every Day Smoker -- 1.00 packs/day for 50 years    Types: Cigarettes    Start date: 07/18/1962  . Smokeless tobacco: Never Used  . Alcohol Use: No  Comment: hx of ETOH about 20 years ago.    OB History    No data available     Review of Systems Negative except as stated in HPI   Allergies  Ibuprofen and Iohexol  Home Medications   Prior to Admission medications   Medication Sig Start Date End Date Taking? Authorizing Provider  acetaminophen (TYLENOL) 325 MG tablet Take 650 mg by mouth every 6 (six) hours as needed for mild pain.    Historical Provider, MD  ADVAIR DISKUS 250-50 MCG/DOSE AEPB Inhale 1 puff into the lungs 2 (two) times daily.  10/05/12   Historical Provider, MD    alendronate (FOSAMAX) 70 MG tablet Take 70 mg by mouth every 7 (seven) days. Take with a full glass of water on an empty stomach. Patient take on monday    Historical Provider, MD  Calcium Carbonate (CALCIUM 600 PO) Take 1 tablet by mouth daily.      Historical Provider, MD  CREON 24000 UNITS CPEP TAKE 2 CAPSULES WITH EACH MEALS AND 1 CAPSULE WITH SNACKS. 07/04/14   Mahala Menghini, PA-C  DULoxetine (CYMBALTA) 60 MG capsule Take 60 mg by mouth daily.      Historical Provider, MD  fentaNYL (DURAGESIC - DOSED MCG/HR) 75 MCG/HR Place 1 patch onto the skin every 3 (three) days.    Historical Provider, MD  LORazepam (ATIVAN) 1 MG tablet Take 1 mg by mouth every 8 (eight) hours as needed for anxiety.    Historical Provider, MD  nitroGLYCERIN (NITROSTAT) 0.4 MG SL tablet Place 1 tablet (0.4 mg total) under the tongue every 5 (five) minutes as needed. 11/21/14   Herminio Commons, MD  omeprazole (PRILOSEC) 20 MG capsule TAKE ONE CAPSULE BY MOUTH ONCE DAILY. 11/29/14   Orvil Feil, NP  promethazine (PHENERGAN) 25 MG tablet Take 25 mg by mouth every 6 (six) hours as needed for nausea or vomiting.     Historical Provider, MD  RESTASIS 0.05 % ophthalmic emulsion Place 1 drop into both eyes 2 (two) times daily.  12/30/13   Historical Provider, MD   BP 109/83 mmHg  Pulse 95  Temp(Src) 97.8 F (36.6 C) (Oral)  Resp 20  Ht 5\' 3"  (1.6 m)  Wt 84 lb (38.102 kg)  BMI 14.88 kg/m2  SpO2 97% Physical Exam  Constitutional: She is oriented to person, place, and time. She appears well-developed and well-nourished. No distress.  HENT:  Head:    Right Ear: Tympanic membrane normal.  Left Ear: Tympanic membrane normal.  Nose: Nose normal.  Mouth/Throat: Uvula is midline, oropharynx is clear and moist and mucous membranes are normal.  Tenderness starting @ the leftTMJ and radiating to the left jaw. Tenderness is minimal. No facial droop, no erythema or ecchymosis noted.  I am unable to palpate any abnormality of the  left side of the face.   Eyes: Conjunctivae and EOM are normal.  Neck: Normal range of motion. Neck supple.  Cardiovascular: Normal rate.   Pulmonary/Chest: Effort normal.  Musculoskeletal: Normal range of motion.  Lymphadenopathy:    She has no cervical adenopathy.  Neurological: She is alert and oriented to person, place, and time. She has normal strength. No cranial nerve deficit or sensory deficit. Gait normal.  Skin: Skin is warm and dry.  Psychiatric: She has a normal mood and affect. Her behavior is normal.  Nursing note and vitals reviewed.   ED Course  Procedures (including critical care time) Dr. Lacinda Axon in to examine the patient. Discussed  clinical findings and s/s of CVA which patient has none.    MDM  69 y.o. female with left side facial pain x 2 hours. Stable for d/c without focal neuro deficits. No concern for CVA or MI at this time.  She will take tylenol and return for worsening symptoms.   Final diagnoses:  Left facial pain       Ashley Murrain, NP 04/13/15 1919  Nat Christen, MD 04/13/15 1958

## 2015-04-13 NOTE — ED Notes (Signed)
Pt c/o pain and swelling to left side of face in front of and above ear.

## 2015-04-20 ENCOUNTER — Emergency Department (HOSPITAL_COMMUNITY)
Admission: EM | Admit: 2015-04-20 | Discharge: 2015-04-20 | Disposition: A | Discharge status: Home / Self Care | Payer: Medicare Other | Attending: Emergency Medicine | Admitting: Emergency Medicine

## 2015-04-20 ENCOUNTER — Encounter (HOSPITAL_COMMUNITY): Payer: Self-pay | Admitting: *Deleted

## 2015-04-20 DIAGNOSIS — Z86010 Personal history of colonic polyps: Secondary | ICD-10-CM | POA: Insufficient documentation

## 2015-04-20 DIAGNOSIS — Z79899 Other long term (current) drug therapy: Secondary | ICD-10-CM | POA: Diagnosis not present

## 2015-04-20 DIAGNOSIS — G8929 Other chronic pain: Secondary | ICD-10-CM | POA: Diagnosis not present

## 2015-04-20 DIAGNOSIS — F329 Major depressive disorder, single episode, unspecified: Secondary | ICD-10-CM | POA: Diagnosis not present

## 2015-04-20 DIAGNOSIS — Z8541 Personal history of malignant neoplasm of cervix uteri: Secondary | ICD-10-CM | POA: Diagnosis not present

## 2015-04-20 DIAGNOSIS — J449 Chronic obstructive pulmonary disease, unspecified: Secondary | ICD-10-CM | POA: Diagnosis not present

## 2015-04-20 DIAGNOSIS — F419 Anxiety disorder, unspecified: Secondary | ICD-10-CM | POA: Diagnosis not present

## 2015-04-20 DIAGNOSIS — H6092 Unspecified otitis externa, left ear: Secondary | ICD-10-CM | POA: Diagnosis not present

## 2015-04-20 DIAGNOSIS — Z720 Tobacco use: Secondary | ICD-10-CM | POA: Insufficient documentation

## 2015-04-20 DIAGNOSIS — I2510 Atherosclerotic heart disease of native coronary artery without angina pectoris: Secondary | ICD-10-CM | POA: Insufficient documentation

## 2015-04-20 DIAGNOSIS — K219 Gastro-esophageal reflux disease without esophagitis: Secondary | ICD-10-CM | POA: Insufficient documentation

## 2015-04-20 DIAGNOSIS — Z8673 Personal history of transient ischemic attack (TIA), and cerebral infarction without residual deficits: Secondary | ICD-10-CM | POA: Insufficient documentation

## 2015-04-20 DIAGNOSIS — Z8739 Personal history of other diseases of the musculoskeletal system and connective tissue: Secondary | ICD-10-CM | POA: Diagnosis not present

## 2015-04-20 DIAGNOSIS — H9202 Otalgia, left ear: Secondary | ICD-10-CM | POA: Diagnosis present

## 2015-04-20 MED ORDER — NEOMYCIN-POLYMYXIN-HC 3.5-10000-1 OT SUSP: 4.0000 [drp] | Freq: Three times a day (TID) | OTIC | Status: DC

## 2015-04-20 NOTE — Discharge Instructions (Signed)
Otitis Externa Take antibiotics as prescribed. Follow up with ENT for further evaluation of your ear.  Return if you develop a rash on the ear that is painful.  Otitis externa is a bacterial or fungal infection of the outer ear canal. This is the area from the eardrum to the outside of the ear. Otitis externa is sometimes called "swimmer's ear." CAUSES  Possible causes of infection include:  Swimming in dirty water.  Moisture remaining in the ear after swimming or bathing.  Mild injury (trauma) to the ear.  Objects stuck in the ear (foreign body).  Cuts or scrapes (abrasions) on the outside of the ear. SIGNS AND SYMPTOMS  The first symptom of infection is often itching in the ear canal. Later signs and symptoms may include swelling and redness of the ear canal, ear pain, and yellowish-white fluid (pus) coming from the ear. The ear pain may be worse when pulling on the earlobe. DIAGNOSIS  Your health care provider will perform a physical exam. A sample of fluid may be taken from the ear and examined for bacteria or fungi. TREATMENT  Antibiotic ear drops are often given for 10 to 14 days. Treatment may also include pain medicine or corticosteroids to reduce itching and swelling. HOME CARE INSTRUCTIONS   Apply antibiotic ear drops to the ear canal as prescribed by your health care provider.  Take medicines only as directed by your health care provider.  If you have diabetes, follow any additional treatment instructions from your health care provider.  Keep all follow-up visits as directed by your health care provider. PREVENTION   Keep your ear dry. Use the corner of a towel to absorb water out of the ear canal after swimming or bathing.  Avoid scratching or putting objects inside your ear. This can damage the ear canal or remove the protective wax that lines the canal. This makes it easier for bacteria and fungi to grow.  Avoid swimming in lakes, polluted water, or poorly  chlorinated pools.  You may use ear drops made of rubbing alcohol and vinegar after swimming. Combine equal parts of white vinegar and alcohol in a bottle. Put 3 or 4 drops into each ear after swimming. SEEK MEDICAL CARE IF:   You have a fever.  Your ear is still red, swollen, painful, or draining pus after 3 days.  Your redness, swelling, or pain gets worse.  You have a severe headache.  You have redness, swelling, pain, or tenderness in the area behind your ear. MAKE SURE YOU:   Understand these instructions.  Will watch your condition.  Will get help right away if you are not doing well or get worse. Document Released: 09/14/2005 Document Revised: 01/29/2014 Document Reviewed: 10/01/2011 [facility] Patient Information 2015 [place], [place]. This information is not intended to replace advice given to you by your health care provider. Make sure you discuss any questions you have with your health care provider.

## 2015-04-20 NOTE — ED Notes (Signed)
L ear pain since yesterday afternoon.  No drainage noted from ear.

## 2015-04-20 NOTE — ED Provider Notes (Signed)
CSN: [id number]     Arrival date & time 04/20/15  1253 History   First MD Initiated Contact with Patient 04/20/15 1326     Chief Complaint  Patient presents with  . Otalgia     (Consider location/radiation/quality/duration/timing/severity/associated sxs/prior Treatment) Patient is a 69 y.o. female presenting with ear pain. The history is provided by the patient. No language interpreter was used.  Otalgia Associated symptoms: no cough and no fever   Ms. Cardy is a 69 y.o female with a history of CAD, pancreatitis, COPD, hyperlipidemia, stroke with left-sided weakness, cervical cancer who presents with left ear pain and bloody drainage since yesterday. She states there have been some vesicles that have popped up that it is painful to move her ear or press on the outside of her ear. He denies taking anything for pain. Movement of the ear makes it worse, nothing makes it better. He denies any fever, chills. Patient is an every day smoker.  Past Medical History  Diagnosis Date  . CAD (coronary artery disease)     palpitations, dizziness, chest pain  . Hx of colonic polyps     adenomatous  . Tobacco abuse   . Back pain, chronic   . Allergic rhinitis   . Anxiety and depression   . Pancreatitis chronic   . Hematochezia   . GERD (gastroesophageal reflux disease)   . COPD (chronic obstructive pulmonary disease)   . Weight loss     CT negative for occult malignancy, negative celiac, negative adrenal insufficiency  . Hyperlipidemia     Lipid profile on 02/25/2011: 209, 113, 55, 132  . Family history of anesthesia complication   . 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 Anxiety   . Depression   . Chronic abdominal pain   . Cervical cancer   . Nephrolithiasis   . Renal calculus    Past Surgical History  Procedure Laterality Date  . Partial hysterectomy    . Appendectomy    . Colonoscopy w/ polypectomy  2006    [id number], benign gastric nodule, multiple adenomatous  polyps, one with tubular morphology  . Ileocolonoscopy  01/11/2009    RMR: Polyp at the splenic flexure, status post hot snare removal/ Normal rectum, tubular adenoma  . Esophagogastroduodenoscopy  2006    [facility]:XNATFT gastric nodule  . Eus  2010    Dr. Ardis Hughs : Multiple shadowing calcifications in pancreas, consistent with Chronic Pancreatitis.  These are mainly confined to a collection of calcifications in head/uncinate pancreas. Otherwise the pancreatic parenchyma appears fairly normal.  The main pancreatic duct, CBD are both normal without stones, dilation.    . Cataract extraction w/phaco Right 05/04/2013    Procedure: CATARACT EXTRACTION PHACO AND INTRAOCULAR LENS PLACEMENT (IOC);  Surgeon: Tonny Branch, MD;  Location: [facility];  Service: Ophthalmology;  Laterality: Right;  CDE:16.17  . Cataract extraction w/phaco Left 05/22/2013    Procedure: CATARACT EXTRACTION PHACO AND INTRAOCULAR LENS PLACEMENT (IOC);  Surgeon: Tonny Branch, MD;  Location: [facility];  Service: Ophthalmology;  Laterality: Left;  CDE:14.75  . Abdominal hysterectomy    . Colonoscopy N/A 02/08/2014    Procedure: COLONOSCOPY;  Surgeon: Daneil Dolin, MD;  [facility];  Service: Endoscopy;  Laterality: N/A;  10:00-moved to 1030 Leigh Ann to notify pt  . Esophagogastroduodenoscopy N/A 02/08/2014    Procedure: ESOPHAGOGASTRODUODENOSCOPY (EGD);  Surgeon: Daneil Dolin, MD;  [facility];  Service: Endoscopy;  Laterality: N/A;   Family History  Problem Relation Age of Onset  . Heart attack Father 71  . Colon cancer Maternal Grandmother    History  Substance Use Topics  . Smoking status: Current Every Day Smoker -- 1.00 packs/day for 50 years    Types: Cigarettes    Start date: 07/18/1962  . Smokeless tobacco: Never Used  . Alcohol Use: No     Comment: hx of ETOH about 20 years ago.    OB History    No data available     Review of Systems  Constitutional: Negative for fever and chills.  HENT: Positive  for ear pain. Negative for facial swelling.   Respiratory: Negative for cough and shortness of breath.   Skin: Negative for wound.      Allergies  Ibuprofen and Iohexol  Home Medications   Prior to Admission medications   Medication Sig Start Date End Date Taking? Authorizing Provider  acetaminophen (TYLENOL) 325 MG tablet Take 650 mg by mouth every 6 (six) hours as needed for mild pain.   Yes Historical Provider, MD  ADVAIR DISKUS 250-50 MCG/DOSE AEPB Inhale 1 puff into the lungs 2 (two) times daily.  10/05/12  Yes Historical Provider, MD  alendronate (FOSAMAX) 70 MG tablet Take 70 mg by mouth every 7 (seven) days. Take with a full glass of water on an empty stomach. Patient take on monday   Yes Historical Provider, MD  Calcium Carbonate (CALCIUM 600 PO) Take 1 tablet by mouth daily.     Yes Historical Provider, MD  cilostazol (PLETAL) 100 MG tablet Take 1 tablet by mouth 2 (two) times daily. 03/29/15  Yes Historical Provider, MD  CREON 24000 UNITS CPEP TAKE 2 CAPSULES WITH EACH MEALS AND 1 CAPSULE WITH SNACKS. 07/04/14  Yes Mahala Menghini, PA-C  DULoxetine (CYMBALTA) 60 MG capsule Take 60 mg by mouth daily.     Yes Historical Provider, MD  fentaNYL (DURAGESIC - DOSED MCG/HR) 75 MCG/HR Place 1 patch onto the skin every 3 (three) days.   Yes Historical Provider, MD  LORazepam (ATIVAN) 1 MG tablet Take 1 mg by mouth every 8 (eight) hours as needed for anxiety.   Yes Historical Provider, MD  nitroGLYCERIN (NITROSTAT) 0.4 MG SL tablet Place 1 tablet (0.4 mg total) under the tongue every 5 (five) minutes as needed. Patient taking differently: Place 0.4 mg under the tongue every 5 (five) minutes as needed for chest pain.  11/21/14  Yes Herminio Commons, MD  omeprazole (PRILOSEC) 20 MG capsule TAKE ONE CAPSULE BY MOUTH ONCE DAILY. 11/29/14  Yes Orvil Feil, NP  promethazine (PHENERGAN) 25 MG tablet Take 25 mg by mouth every 6 (six) hours as needed for nausea or vomiting.    Yes Historical Provider, MD   RESTASIS 0.05 % ophthalmic emulsion Place 1 drop into both eyes 2 (two) times daily.  12/30/13  Yes Historical Provider, MD  neomycin-polymyxin-hydrocortisone (CORTISPORIN) 3.5-10000-1 otic suspension Place 4 drops into the left ear 3 (three) times daily. 04/20/15   Earle Troiano Patel-Mills, PA-C   BP 104/71 mmHg  Pulse 90  Temp(Src) 98.6 F (37 C) (Oral)  Resp 15  Ht 5\' 2"  (1.575 m)  Wt 83 lb (37.649 kg)  BMI 15.18 kg/m2  SpO2 98% Physical Exam  Constitutional: She is oriented to person, place, and time. She appears well-developed and well-nourished.  HENT:  Head: Normocephalic and atraumatic.  Right Ear: Tympanic membrane, external ear and ear canal normal.  Left Ear: Tympanic membrane normal. There is tenderness. No drainage or  swelling. No foreign bodies. Tympanic membrane is not injected, not scarred, not perforated, not erythematous, not retracted and not bulging. No hemotympanum.  Left ear canal is erythematous but without drainage. It appears as if the canal was bleeding at one time but has since stopped. She has tenderness to palpation of the tragus and movement of the pinna. There are 4-5 small vesicles that are not erythematous but painful. The TM is intact and not perforated.  Eyes: Conjunctivae are normal.  Neck: Normal range of motion. Neck supple.  Cardiovascular: Normal rate.   Pulmonary/Chest: Effort normal. No respiratory distress.  Abdominal: Soft.  Musculoskeletal: Normal range of motion.  Neurological: She is alert and oriented to person, place, and time.  Skin: Skin is warm and dry. No rash noted.  Psychiatric: She has a normal mood and affect. Her behavior is normal.    ED Course  Procedures (including critical care time) Labs Review Labs Reviewed - No data to display  Imaging Review No results found.   EKG Interpretation None      MDM   Final diagnoses:  Otitis externa, left  Patient presents for left external ear pain and internal ear pain since  yesterday. She admits to putting peroxide in her ear 2 days ago. She denies using anything to clean the ear. I do not believe this is shingles. I had Dr. Roderic Palau take a look at the patient's ear and he agrees with me. She most likely has an external ear infection. I prescribed Cortisporin and discussed return precautions including rash. I also gave her ENT follow-up. Patient verbally agrees with the plan.     Ottie Glazier, PA-C 04/20/15 1418  Milton Ferguson, MD 04/20/15 1539

## 2015-06-28 ENCOUNTER — Other Ambulatory Visit: Payer: Self-pay | Admitting: Gastroenterology

## 2015-10-08 ENCOUNTER — Other Ambulatory Visit: Payer: Self-pay | Admitting: Gastroenterology

## 2015-10-28 ENCOUNTER — Other Ambulatory Visit (HOSPITAL_COMMUNITY): Payer: Self-pay | Admitting: Family Medicine

## 2015-10-28 DIAGNOSIS — R1011 Right upper quadrant pain: Secondary | ICD-10-CM

## 2015-10-31 ENCOUNTER — Other Ambulatory Visit (HOSPITAL_COMMUNITY): Payer: Self-pay | Admitting: Family Medicine

## 2015-10-31 ENCOUNTER — Ambulatory Visit (HOSPITAL_COMMUNITY)
Admission: RE | Admit: 2015-10-31 | Discharge: 2015-10-31 | Disposition: A | Discharge status: Home / Self Care | Payer: Medicare Other | Source: Ambulatory Visit | Attending: Family Medicine | Admitting: Family Medicine

## 2015-10-31 DIAGNOSIS — R1011 Right upper quadrant pain: Secondary | ICD-10-CM | POA: Diagnosis present

## 2015-11-09 ENCOUNTER — Emergency Department (HOSPITAL_COMMUNITY)
Admission: EM | Admit: 2015-11-09 | Discharge: 2015-11-09 | Disposition: A | Discharge status: Home / Self Care | Payer: Medicare Other | Attending: Emergency Medicine | Admitting: Emergency Medicine

## 2015-11-09 ENCOUNTER — Encounter (HOSPITAL_COMMUNITY): Payer: Self-pay | Admitting: Emergency Medicine

## 2015-11-09 DIAGNOSIS — Z79899 Other long term (current) drug therapy: Secondary | ICD-10-CM | POA: Diagnosis not present

## 2015-11-09 DIAGNOSIS — F419 Anxiety disorder, unspecified: Secondary | ICD-10-CM | POA: Diagnosis not present

## 2015-11-09 DIAGNOSIS — R112 Nausea with vomiting, unspecified: Secondary | ICD-10-CM | POA: Insufficient documentation

## 2015-11-09 DIAGNOSIS — R1013 Epigastric pain: Secondary | ICD-10-CM | POA: Insufficient documentation

## 2015-11-09 DIAGNOSIS — J449 Chronic obstructive pulmonary disease, unspecified: Secondary | ICD-10-CM | POA: Insufficient documentation

## 2015-11-09 DIAGNOSIS — K219 Gastro-esophageal reflux disease without esophagitis: Secondary | ICD-10-CM | POA: Insufficient documentation

## 2015-11-09 DIAGNOSIS — R109 Unspecified abdominal pain: Secondary | ICD-10-CM | POA: Diagnosis present

## 2015-11-09 DIAGNOSIS — G8929 Other chronic pain: Secondary | ICD-10-CM | POA: Diagnosis not present

## 2015-11-09 DIAGNOSIS — R636 Underweight: Secondary | ICD-10-CM | POA: Diagnosis not present

## 2015-11-09 DIAGNOSIS — Z792 Long term (current) use of antibiotics: Secondary | ICD-10-CM | POA: Diagnosis not present

## 2015-11-09 DIAGNOSIS — Z86010 Personal history of colonic polyps: Secondary | ICD-10-CM | POA: Insufficient documentation

## 2015-11-09 DIAGNOSIS — F329 Major depressive disorder, single episode, unspecified: Secondary | ICD-10-CM | POA: Insufficient documentation

## 2015-11-09 DIAGNOSIS — Z8541 Personal history of malignant neoplasm of cervix uteri: Secondary | ICD-10-CM | POA: Insufficient documentation

## 2015-11-09 DIAGNOSIS — F17210 Nicotine dependence, cigarettes, uncomplicated: Secondary | ICD-10-CM | POA: Diagnosis not present

## 2015-11-09 DIAGNOSIS — Z9049 Acquired absence of other specified parts of digestive tract: Secondary | ICD-10-CM | POA: Diagnosis not present

## 2015-11-09 DIAGNOSIS — Z8673 Personal history of transient ischemic attack (TIA), and cerebral infarction without residual deficits: Secondary | ICD-10-CM | POA: Insufficient documentation

## 2015-11-09 DIAGNOSIS — H578 Other specified disorders of eye and adnexa: Secondary | ICD-10-CM | POA: Insufficient documentation

## 2015-11-09 DIAGNOSIS — Z90710 Acquired absence of both cervix and uterus: Secondary | ICD-10-CM | POA: Diagnosis not present

## 2015-11-09 DIAGNOSIS — I2510 Atherosclerotic heart disease of native coronary artery without angina pectoris: Secondary | ICD-10-CM | POA: Insufficient documentation

## 2015-11-09 LAB — COMPREHENSIVE METABOLIC PANEL
ALBUMIN: 4.5 g/dL (ref 3.5–5.0)
ALK PHOS: 68 U/L (ref 38–126)
ALT: 10 U/L — AB (ref 14–54)
ANION GAP: 9 (ref 5–15)
AST: 18 U/L (ref 15–41)
BILIRUBIN TOTAL: 0.4 mg/dL (ref 0.3–1.2)
BUN: 7 mg/dL (ref 6–20)
CALCIUM: 9.1 mg/dL (ref 8.9–10.3)
CO2: 30 mmol/L (ref 22–32)
CREATININE: 0.42 mg/dL — AB (ref 0.44–1.00)
Chloride: 99 mmol/L — ABNORMAL LOW (ref 101–111)
GFR calc Af Amer: >60 mL/min (ref >60)
GFR calc non Af Amer: >60 mL/min (ref >60)
GLUCOSE: 80 mg/dL (ref 65–99)
Potassium: 3.9 mmol/L (ref 3.5–5.1)
Sodium: 138 mmol/L (ref 135–145)
TOTAL PROTEIN: 8 g/dL (ref 6.5–8.1)

## 2015-11-09 LAB — URINALYSIS, ROUTINE W REFLEX MICROSCOPIC
BILIRUBIN URINE: NEGATIVE
Glucose, UA: NEGATIVE mg/dL
KETONES UR: NEGATIVE mg/dL
Leukocytes, UA: NEGATIVE
NITRITE: NEGATIVE
Protein, ur: NEGATIVE mg/dL
Specific Gravity, Urine: 1.01 (ref 1.005–1.030)
pH: 6 (ref 5.0–8.0)

## 2015-11-09 LAB — CBC WITH DIFFERENTIAL/PLATELET
BASOS PCT: 0 %
Basophils Absolute: 0 10*3/uL (ref 0.0–0.1)
EOS ABS: 0.1 10*3/uL (ref 0.0–0.7)
EOS PCT: 2 %
HCT: 42.1 % (ref 36.0–46.0)
Hemoglobin: 14.5 g/dL (ref 12.0–15.0)
Lymphocytes Relative: 44 %
Lymphs Abs: 3 10*3/uL (ref 0.7–4.0)
MCH: 31.9 pg (ref 26.0–34.0)
MCHC: 34.4 g/dL (ref 30.0–36.0)
MCV: 92.5 fL (ref 78.0–100.0)
MONO ABS: 0.5 10*3/uL (ref 0.1–1.0)
MONOS PCT: 7 %
Neutro Abs: 3.3 10*3/uL (ref 1.7–7.7)
Neutrophils Relative %: 47 %
Platelets: 283 10*3/uL (ref 150–400)
RBC: 4.55 MIL/uL (ref 3.87–5.11)
RDW: 13.1 % (ref 11.5–15.5)
WBC: 6.9 10*3/uL (ref 4.0–10.5)

## 2015-11-09 LAB — URINE MICROSCOPIC-ADD ON: WBC UA: NONE SEEN WBC/hpf (ref 0–5)

## 2015-11-09 LAB — LIPASE, BLOOD: Lipase: 28 U/L (ref 11–51)

## 2015-11-09 MED ORDER — SODIUM CHLORIDE 0.9 % IV BOLUS (SEPSIS)
1000.0000 mL | Freq: Once | INTRAVENOUS | Status: AC
  Administered 2015-11-09: 1000 mL via INTRAVENOUS

## 2015-11-09 MED ORDER — SODIUM CHLORIDE 0.9 % IV SOLN
INTRAVENOUS | Status: DC
Start: 2015-11-09 — End: 2015-11-09

## 2015-11-09 MED ORDER — HYDROMORPHONE HCL 1 MG/ML IJ SOLN
0.5000 mg | INTRAMUSCULAR | Status: DC | PRN
  Administered 2015-11-09 (×2): 0.5 mg via INTRAVENOUS
  Filled 2015-11-09 (×2): qty 1

## 2015-11-09 MED ORDER — ONDANSETRON HCL 4 MG/2ML IJ SOLN
4.0000 mg | Freq: Once | INTRAMUSCULAR | Status: AC
Start: 2015-11-09 — End: 2015-11-09
  Administered 2015-11-09: 4 mg via INTRAVENOUS
  Filled 2015-11-09: qty 2

## 2015-11-09 NOTE — ED Provider Notes (Signed)
CSN: [id number]     Arrival date & time 11/09/15  1531 History   First MD Initiated Contact with Patient 11/09/15 1718     Chief Complaint  Patient presents with  . Eye Problem  . Abdominal Pain   Patient is a 70 y.o. female presenting with eye problem and abdominal pain.  Eye Problem Abdominal Pain  the patient presents to the emergency room with a primary complaint of abdominal pain. Patient has a history of chronic pancreatitis. She has had recurrent exacerbations over the last several months. The last few days she's had worsening symptoms. She's had nausea vomiting after taking a new antibiotic that was started.  She denies any diarrhea. No fever. She has been having trouble with irritation to her bilateral eyelids. Her eyes feel dry. She has not noticed any drainage. Patient has been seeing an eye doctor. She was put on several eyedrops. Her primary doctor started on doxycycline for the possibility of a staph infection. Patient has noted nausea and vomiting associated with her abdominal pain was worse after starting that medication.  Past Medical History  Diagnosis Date  . CAD (coronary artery disease)     palpitations, dizziness, chest pain  . Hx of colonic polyps     adenomatous  . Tobacco abuse   . Back pain, chronic   . Allergic rhinitis   . Anxiety and depression   . Pancreatitis chronic   . Hematochezia   . GERD (gastroesophageal reflux disease)   . COPD (chronic obstructive pulmonary disease) ([place])   . Weight loss     CT negative for occult malignancy, negative celiac, negative adrenal insufficiency  . Hyperlipidemia     Lipid profile on 02/25/2011: 209, 113, 55, 132  . Family history of anesthesia complication   . Stroke [facility]) 3-4 yrs ago    left sided weakness  . Anxiety   . Depression   . Chronic abdominal pain   . Cervical cancer ([place])   . Nephrolithiasis   . Renal calculus    Past Surgical History  Procedure Laterality Date  . Partial hysterectomy    .  Appendectomy    . Colonoscopy w/ polypectomy  2006    [id number], benign gastric nodule, multiple adenomatous polyps, one with tubular morphology  . Ileocolonoscopy  01/11/2009    RMR: Polyp at the splenic flexure, status post hot snare removal/ Normal rectum, tubular adenoma  . Esophagogastroduodenoscopy  2006    [id number] gastric nodule  . Eus  2010    Dr. Ardis Hughs : Multiple shadowing calcifications in pancreas, consistent with Chronic Pancreatitis.  These are mainly confined to a collection of calcifications in head/uncinate pancreas. Otherwise the pancreatic parenchyma appears fairly normal.  The main pancreatic duct, CBD are both normal without stones, dilation.    . Cataract extraction w/phaco Right 05/04/2013    Procedure: CATARACT EXTRACTION PHACO AND INTRAOCULAR LENS PLACEMENT (IOC);  Surgeon: Tonny Branch, MD;  Location: [facility] OR[facility];  Service: Ophthalmology;  Laterality: Right;  CDE:16.17  . Cataract extraction w/phaco Left 05/22/2013    Procedure: CATARACT EXTRACTION PHACO AND INTRAOCULAR LENS PLACEMENT (IOC);  Surgeon: Tonny Branch, MD;  Location: [facility] ORS;  Service: Ophthalmology;  Laterality: Left;  CDE:14.75  . Abdominal hysterectomy    . Colonoscopy N/A 02/08/2014    Procedure: COLONOSCOPY;  Surgeon: Daneil Dolin, MD;  Location: [facility] END[facility];  Service: Endoscopy;  Laterality: N/A;  10:00-moved to 1030 Leigh Ann to notify pt  . Esophagogastroduodenoscopy N/A 02/08/2014    Procedure:  ESOPHAGOGASTRODUODENOSCOPY (EGD);  Surgeon: Daneil Dolin, MD;  Location: [facility];  Service: Endoscopy;  Laterality: N/A;   Family History  Problem Relation Age of Onset  . Heart attack Father 71  . Colon cancer Maternal Grandmother    Social History  Substance Use Topics  . Smoking status: Current Every Day Smoker -- 1.00 packs/day for 50 years    Types: Cigarettes    Start date: 07/18/1962  . Smokeless tobacco: Never Used  . Alcohol Use: No     Comment: hx of ETOH about 20 years ago.    OB  History    No data available     Review of Systems  Gastrointestinal: Positive for abdominal pain.  All other systems reviewed and are negative.     Allergies  Ibuprofen; Iohexol; and Strawberry (diagnostic)  Home Medications   Prior to Admission medications   Medication Sig Start Date End Date Taking? Authorizing Provider  acetaminophen (TYLENOL) 325 MG tablet Take 650 mg by mouth every 6 (six) hours as needed for mild pain.   Yes Historical Provider, MD  ADVAIR DISKUS 250-50 MCG/DOSE AEPB Inhale 1 puff into the lungs 2 (two) times daily.  10/05/12  Yes Historical Provider, MD  Calcium Carbonate (CALCIUM 600 PO) Take 1 tablet by mouth daily.     Yes Historical Provider, MD  cilostazol (PLETAL) 100 MG tablet Take 1 tablet by mouth 2 (two) times daily. 03/29/15  Yes Historical Provider, MD  CREON 24000 units CPEP TAKE 2 CAPSULES WITH EACH MEALS AND 1 CAPSULE WITH SNACKS. 10/09/15  Yes Carlis Stable, NP  doxycycline (VIBRA-TABS) 100 MG tablet Take 100 mg by mouth 2 (two) times daily. 11/06/15  Yes Historical Provider, MD  DULoxetine (CYMBALTA) 60 MG capsule Take 60 mg by mouth 2 (two) times daily.    Yes Historical Provider, MD  fentaNYL (DURAGESIC - DOSED MCG/HR) 75 MCG/HR Place 1 patch onto the skin every 3 (three) days.   Yes Historical Provider, MD  fluorometholone (FML) 0.1 % ophthalmic suspension Place 1 drop into both eyes 4 (four) times daily. 10/16/15  Yes Historical Provider, MD  Lifitegrast 5 % SOLN Place 1 drop into both eyes 2 (two) times daily.   Yes Historical Provider, MD  LORazepam (ATIVAN) 1 MG tablet Take 1 mg by mouth every 8 (eight) hours as needed for anxiety.   Yes Historical Provider, MD  nitroGLYCERIN (NITROSTAT) 0.4 MG SL tablet Place 1 tablet (0.4 mg total) under the tongue every 5 (five) minutes as needed. Patient taking differently: Place 0.4 mg under the tongue every 5 (five) minutes as needed for chest pain.  11/21/14  Yes Herminio Commons, MD  omeprazole  (PRILOSEC) 20 MG capsule TAKE 1 CAPSULE BY MOUTH ONCE DAILY. 07/03/15  Yes Carlis Stable, NP  promethazine (PHENERGAN) 25 MG tablet Take 25 mg by mouth every 6 (six) hours as needed for nausea or vomiting.    Yes Historical Provider, MD  neomycin-polymyxin-hydrocortisone (CORTISPORIN) 3.5-10000-1 otic suspension Place 4 drops into the left ear 3 (three) times daily. 04/20/15   Hanna Patel-Mills, PA-C  PROLIA 60 MG/ML SOLN injection Inject as directed every 6 (six) months. 10/31/15   Historical Provider, MD   BP 104/66 mmHg  Pulse 87  Temp(Src) 98.2 F (36.8 C) (Oral)  Resp 16  Ht 5\' 4"  (1.626 m)  Wt 38.102 kg  BMI 14.41 kg/m2  SpO2 97% Physical Exam  Constitutional: No distress.  Thin, underweight  HENT:  Head: Normocephalic and  atraumatic.  Right Ear: External ear normal.  Left Ear: External ear normal.  Eyes: Conjunctivae and EOM are normal. Pupils are equal, round, and reactive to light. Right eye exhibits no discharge and no hordeolum. No foreign body present in the right eye. Left eye exhibits no discharge and no hordeolum. No foreign body present in the left eye. Right conjunctiva is not injected. Left conjunctiva is not injected. No scleral icterus.  No conjunctival discharge  Neck: Neck supple. No tracheal deviation present.  Cardiovascular: Normal rate, regular rhythm and intact distal pulses.   Pulmonary/Chest: Effort normal and breath sounds normal. No stridor. No respiratory distress. She has no wheezes. She has no rales.  Abdominal: Soft. Bowel sounds are normal. She exhibits no distension. There is tenderness in the epigastric area. There is guarding. There is no rigidity and no rebound.  Musculoskeletal: She exhibits no edema or tenderness.  Neurological: She is alert. She has normal strength. No cranial nerve deficit (no facial droop, extraocular movements intact, no slurred speech) or sensory deficit. She exhibits normal muscle tone. She displays no seizure activity.  Coordination normal.  Skin: Skin is warm and dry. No rash noted.  Psychiatric: She has a normal mood and affect.  Nursing note and vitals reviewed.   ED Course  Procedures (including critical care time) Labs Review Labs Reviewed  COMPREHENSIVE METABOLIC PANEL - Abnormal; Notable for the following:    Chloride 99 (*)    Creatinine, Ser 0.42 (*)    ALT 10 (*)    All other components within normal limits  URINALYSIS, ROUTINE W REFLEX MICROSCOPIC (NOT AT [facility]) - Abnormal; Notable for the following:    Hgb urine dipstick SMALL (*)    All other components within normal limits  URINE MICROSCOPIC-ADD ON - Abnormal; Notable for the following:    Squamous Epithelial / LPF 0-5 (*)    Bacteria, UA FEW (*)    All other components within normal limits  LIPASE, BLOOD  CBC WITH DIFFERENTIAL/PLATELET   Medications  sodium chloride 0.9 % bolus 1,000 mL (1,000 mLs Intravenous New Bag/Given 11/09/15 1743)    And  0.9 %  sodium chloride infusion (not administered)  HYDROmorphone (DILAUDID) injection 0.5 mg (0.5 mg Intravenous Given 11/09/15 1905)  ondansetron (ZOFRAN) injection 4 mg (4 mg Intravenous Given 11/09/15 1736)     MDM   Final diagnoses:  Chronic abdominal pain    Pt's labs are unremarkable.  Pt has history of chronic abdominal pain associated with her pancreatitis.  Sx most likely related to that.  No vomiting here in the ED.  Treated with pain meds with some improvement.  No ocular findings to suggest acute infection.  Follow up with her eye doctor    Dorie Rank, MD 11/09/15 [phone]

## 2015-11-09 NOTE — ED Notes (Signed)
Pt states that she has had chronic pancreatitis for the past few months as well as an eye infection.  States that she has not been able to see due to this infection for 3 months and the doctor has tried multiple antibiotics.  Cannot keep antibiotics for her eyes down and was told to come here for evaluation by her doctor.

## 2015-11-09 NOTE — Discharge Instructions (Signed)

## 2015-11-09 NOTE — ED Notes (Signed)
Pt stated she wanted another dose of IV Dilaudid prior to dischage, I informed her that I could give her another dose, but she would then have to wait at least 15 mins before being discharged. Patient declined the medication stating she did not want to wait to be discharged. Stated if her pain returned she would come back here or go to [place].

## 2015-11-19 ENCOUNTER — Encounter: Payer: Self-pay | Admitting: Internal Medicine

## 2015-11-19 ENCOUNTER — Ambulatory Visit (INDEPENDENT_AMBULATORY_CARE_PROVIDER_SITE_OTHER): Payer: Medicare Other | Admitting: Internal Medicine

## 2015-11-19 VITALS — BP 107/72 | HR 100 | Temp 97.2°F | Ht 64.0 in | Wt 83.8 lb

## 2015-11-19 DIAGNOSIS — R1011 Right upper quadrant pain: Secondary | ICD-10-CM

## 2015-11-19 DIAGNOSIS — K5900 Constipation, unspecified: Secondary | ICD-10-CM | POA: Diagnosis not present

## 2015-11-19 DIAGNOSIS — K861 Other chronic pancreatitis: Secondary | ICD-10-CM

## 2015-11-19 DIAGNOSIS — G8929 Other chronic pain: Secondary | ICD-10-CM

## 2015-11-19 DIAGNOSIS — K219 Gastro-esophageal reflux disease without esophagitis: Secondary | ICD-10-CM

## 2015-11-19 DIAGNOSIS — R1010 Upper abdominal pain, unspecified: Secondary | ICD-10-CM

## 2015-11-19 NOTE — Patient Instructions (Signed)
Constipation information provided  Continue omeprazole and Creon daily  Trial of Amitiza 8 mcg gelcap during meals x 2 week trial  Let us know how you are doing in 2 weeks and we will go from there

## 2015-11-19 NOTE — Progress Notes (Signed)
Primary Care Physician:  Kamdon Reisig Bellow, MD Primary Gastroenterologist:  Dr.  Gala Romney  Pre-Procedure History & Physical: HPI:  [name] is a 70 y.o. female here for   Past Medical History  Diagnosis Date  . CAD (coronary artery disease)     palpitations, dizziness, chest pain  . Hx of colonic polyps     adenomatous  . Tobacco abuse   . Back pain, chronic   . Allergic rhinitis   . Anxiety and depression   . Pancreatitis chronic   . Hematochezia   . GERD (gastroesophageal reflux disease)   . COPD (chronic obstructive pulmonary disease) ([place])   . Weight loss     CT negative for occult malignancy, negative celiac, negative adrenal insufficiency  . Hyperlipidemia     Lipid profile on 02/25/2011: 209, 113, 55, 132  . Family history of anesthesia complication   . Stroke [facility]) 3-4 yrs ago    left sided weakness  . Anxiety   . Depression   . Chronic abdominal pain   . Cervical cancer ([place])   . Nephrolithiasis   . Renal calculus   . Helicobacter pylori infection 2015    treated with prevpac  . Tubular adenoma 2015    Past Surgical History  Procedure Laterality Date  . Partial hysterectomy    . Appendectomy    . Colonoscopy w/ polypectomy  2006    [id number], benign gastric nodule, multiple adenomatous polyps, one with tubular morphology  . Ileocolonoscopy  01/11/2009    RMR: Polyp at the splenic flexure, status post hot snare removal/ Normal rectum, tubular adenoma  . Esophagogastroduodenoscopy  2006    [id number] gastric nodule  . Eus  2010    Dr. Ardis Hughs : Multiple shadowing calcifications in pancreas, consistent with Chronic Pancreatitis.  These are mainly confined to a collection of calcifications in head/uncinate pancreas. Otherwise the pancreatic parenchyma appears fairly normal.  The main pancreatic duct, CBD are both normal without stones, dilation.    . Cataract extraction w/phaco Right 05/04/2013    Procedure: CATARACT EXTRACTION PHACO AND  INTRAOCULAR LENS PLACEMENT (IOC);  Surgeon: Tonny Branch, MD;  Location: [facility];  Service: Ophthalmology;  Laterality: Right;  CDE:16.17  . Cataract extraction w/phaco Left 05/22/2013    Procedure: CATARACT EXTRACTION PHACO AND INTRAOCULAR LENS PLACEMENT (IOC);  Surgeon: Tonny Branch, MD;  Location: [facility];  Service: Ophthalmology;  Laterality: Left;  CDE:14.75  . Abdominal hysterectomy    . Colonoscopy N/A 02/08/2014    Dr.Rheta Hemmelgarn- normal rectum, colonic polyps bx=tubular adenoma  . Esophagogastroduodenoscopy N/A 02/08/2014    Dr.Crit Obremski- abnormal stomach and gstric nodule bx= hpylori    Prior to Admission medications   Medication Sig Start Date End Date Taking? Authorizing Provider  acetaminophen (TYLENOL) 325 MG tablet Take 650 mg by mouth every 6 (six) hours as needed for mild pain.   Yes Historical Provider, MD  ADVAIR DISKUS 250-50 MCG/DOSE AEPB Inhale 1 puff into the lungs 2 (two) times daily.  10/05/12  Yes Historical Provider, MD  Calcium Carbonate (CALCIUM 600 PO) Take 1 tablet by mouth daily.     Yes Historical Provider, MD  cilostazol (PLETAL) 100 MG tablet Take 1 tablet by mouth 2 (two) times daily. 03/29/15  Yes Historical Provider, MD  CREON 24000 units CPEP TAKE 2 CAPSULES WITH EACH MEALS AND 1 CAPSULE WITH SNACKS. 10/09/15  Yes Carlis Stable, NP  DULoxetine (CYMBALTA) 60 MG capsule Take 60 mg by mouth 2 (two) times daily.  Yes Historical Provider, MD  fentaNYL (DURAGESIC - DOSED MCG/HR) 75 MCG/HR Place 1 patch onto the skin every 3 (three) days.   Yes Historical Provider, MD  fluorometholone (FML) 0.1 % ophthalmic suspension Place 1 drop into both eyes 4 (four) times daily. 10/16/15  Yes Historical Provider, MD  Lifitegrast 5 % SOLN Place 1 drop into both eyes 2 (two) times daily.   Yes Historical Provider, MD  LORazepam (ATIVAN) 1 MG tablet Take 1 mg by mouth every 8 (eight) hours as needed for anxiety. Reported on 11/19/2015   Yes Historical Provider, MD  omeprazole (PRILOSEC) 20 MG capsule  TAKE 1 CAPSULE BY MOUTH ONCE DAILY. 07/03/15  Yes Carlis Stable, NP  PROLIA 60 MG/ML SOLN injection Inject as directed every 6 (six) months. 10/31/15  Yes Historical Provider, MD  promethazine (PHENERGAN) 25 MG tablet Take 25 mg by mouth every 6 (six) hours as needed for nausea or vomiting.    Yes Historical Provider, MD  doxycycline (VIBRA-TABS) 100 MG tablet Take 100 mg by mouth 2 (two) times daily. Reported on 11/19/2015 11/06/15   Historical Provider, MD  neomycin-polymyxin-hydrocortisone (CORTISPORIN) 3.5-10000-1 otic suspension Place 4 drops into the left ear 3 (three) times daily. Patient not taking: Reported on 11/19/2015 04/20/15   Ottie Glazier, PA-C  nitroGLYCERIN (NITROSTAT) 0.4 MG SL tablet Place 1 tablet (0.4 mg total) under the tongue every 5 (five) minutes as needed. Patient not taking: Reported on 11/19/2015 11/21/14   Herminio Commons, MD    Allergies as of 11/19/2015 - Review Complete 11/19/2015  Allergen Reaction Noted  . Ibuprofen Anaphylaxis and Hives 12/19/2008  . Iohexol Hives and Swelling 05/23/2004  . Strawberry (diagnostic) Itching 11/09/2015    Family History  Problem Relation Age of Onset  . Heart attack Father 33  . Colon cancer Maternal Grandmother     Social History   Social History  . Marital Status: Divorced    Spouse Name: N/A  . Number of Children: 3  . Years of Education: N/A   Occupational History  . disabled    Social History Main Topics  . Smoking status: Current Every Day Smoker -- 1.00 packs/day for 50 years    Types: Cigarettes    Start date: 07/18/1962  . Smokeless tobacco: Never Used     Comment: 3/4 pack daily  . Alcohol Use: No     Comment: hx of ETOH about 20 years ago.   . Drug Use: No  . Sexual Activity: No   Other Topics Concern  . Not on file   Social History Narrative    Review of Systems: See HPI, otherwise negative ROS  Physical Exam: BP 107/72 mmHg  Pulse 100  Temp(Src) 97.2 F (36.2 C) (Oral)  Ht 5\' 4"   (1.626 m)  Wt 83 lb 12.8 oz (38.011 kg)  BMI 14.38 kg/m2 General:  Cachectic appearing  pleasant and cooperative in NAD Skin:  Intact without significant lesions or rashes. Neck:  Supple; no masses or thyromegaly. No significant cervical adenopathy. Lungs:  Clear throughout to auscultation.   No wheezes, crackles, or rhonchi. No acute distress. Heart:  Regular rate and rhythm; no murmurs, clicks, rubs,  or gallops. Abdomen: Non-distended, normal bowel sounds.  Soft and nontender without appreciable mass or hepatosplenomegaly.  Pulses:  Normal pulses noted. Extremities:  Without clubbing or edema.  Impression:    Pleasant 70 year old lady with chronic abdominal pain the setting of constipation. History of chronic pancreatitis and GERD. Continues on her PPI and  pancreatic enzyme supplements. Takes Duragesic patches for control of pain. She's had extensive evaluation of the past year or so including upper and lower endoscopy, ultrasound, CT and labs.   I suspect constipation, in part, may be a contributing factor to her abdominal pain although at the end of the day, she may have chronic idiopathic abdominal pain as well.  Weight loss has improved. She needs more effective management of constipation.  She did not tolerate Linzess previously.  Formal referral to a pain management specialist may be her best interest at some point in time   Recommendations:   Constipation information provided  Continue omeprazole and Creon daily  Trial of Amitiza 8 mcg gelcap during meals x 2 week trial  Let us know how you are doing in 2 weeks and we will go from there      Notice: This dictation was prepared with Dragon dictation along with smaller phrase technology. Any transcriptional errors that result from this process are unintentional and may not be corrected upon review.

## 2016-01-27 ENCOUNTER — Other Ambulatory Visit: Payer: Self-pay | Admitting: Nurse Practitioner

## 2016-05-19 ENCOUNTER — Ambulatory Visit (INDEPENDENT_AMBULATORY_CARE_PROVIDER_SITE_OTHER): Payer: Medicare Other | Admitting: Internal Medicine

## 2016-05-19 ENCOUNTER — Encounter: Payer: Self-pay | Admitting: Internal Medicine

## 2016-05-19 VITALS — BP 105/71 | HR 93 | Temp 98.1°F | Ht 64.0 in | Wt 80.8 lb

## 2016-05-19 DIAGNOSIS — K219 Gastro-esophageal reflux disease without esophagitis: Secondary | ICD-10-CM

## 2016-05-19 DIAGNOSIS — R109 Unspecified abdominal pain: Secondary | ICD-10-CM

## 2016-05-19 DIAGNOSIS — K8689 Other specified diseases of pancreas: Secondary | ICD-10-CM | POA: Diagnosis not present

## 2016-05-19 DIAGNOSIS — K5903 Drug induced constipation: Secondary | ICD-10-CM

## 2016-05-19 DIAGNOSIS — T402X5A Adverse effect of other opioids, initial encounter: Secondary | ICD-10-CM

## 2016-05-19 NOTE — Progress Notes (Signed)
Primary Care Physician:  Dezmond Downie Bellow, MD Primary Gastroenterologist:  Dr. Gala Romney  Pre-Procedure History & Physical: HPI:  [name] is a 70 y.o. female here for GERD, chronic pancreatitis, opioid-induced constipation; takes Duragesic patches on a regular basis. Patient felt Amitiza was too strong and did not agree with her. Abdominal pain sometimes localized to the right side. She wonders if she has occult gallbladder disease. In addition, Linzess was felt to be too strong for this nice lady as well. She is down 3 pounds and she was last seen.  Past Medical History:  Diagnosis Date  . Allergic rhinitis   . Anxiety   . Anxiety and depression   . Back pain, chronic   . CAD (coronary artery disease)    palpitations, dizziness, chest pain  . Cervical cancer ([place])   . Chronic abdominal pain   . COPD (chronic obstructive pulmonary disease) ([place])   . Depression   . Family history of anesthesia complication   . GERD (gastroesophageal reflux disease)   . Helicobacter pylori infection 2015   treated with prevpac  . Hematochezia   . Hx of colonic polyps    adenomatous  . Hyperlipidemia    Lipid profile on 02/25/2011: 209, 113, 55, 132  . Nephrolithiasis   . Pancreatitis chronic   . Renal calculus   . Stroke [facility]) 3-4 yrs ago   left sided weakness  . Tobacco abuse   . Tubular adenoma 2015  . Weight loss    CT negative for occult malignancy, negative celiac, negative adrenal insufficiency    Past Surgical History:  Procedure Laterality Date  . ABDOMINAL HYSTERECTOMY    . APPENDECTOMY    . CATARACT EXTRACTION W/PHACO Right 05/04/2013   Procedure: CATARACT EXTRACTION PHACO AND INTRAOCULAR LENS PLACEMENT (IOC);  Surgeon: Tonny Branch, MD;  Location: [facility];  Service: Ophthalmology;  Laterality: Right;  CDE:16.17  . CATARACT EXTRACTION W/PHACO Left 05/22/2013   Procedure: CATARACT EXTRACTION PHACO AND INTRAOCULAR LENS PLACEMENT (IOC);  Surgeon: Tonny Branch, MD;  Location: [facility]  [facility];  Service: Ophthalmology;  Laterality: Left;  CDE:14.75  . COLONOSCOPY N/A 02/08/2014   Dr.Noriko Macari- normal rectum, colonic polyps bx=tubular adenoma  . COLONOSCOPY W/ POLYPECTOMY  2006   [id number], benign gastric nodule, multiple adenomatous polyps, one with tubular morphology  . ESOPHAGOGASTRODUODENOSCOPY  2006   [id number] gastric nodule  . ESOPHAGOGASTRODUODENOSCOPY N/A 02/08/2014   Dr.Prabhav Faulkenberry- abnormal stomach and gstric nodule bx= hpylori  . EUS  2010   Dr. Ardis Hughs : Multiple shadowing calcifications in pancreas, consistent with Chronic Pancreatitis.  These are mainly confined to a collection of calcifications in head/uncinate pancreas. Otherwise the pancreatic parenchyma appears fairly normal.  The main pancreatic duct, CBD are both normal without stones, dilation.    . Ileocolonoscopy  01/11/2009   RMR: Polyp at the splenic flexure, status post hot snare removal/ Normal rectum, tubular adenoma  . PARTIAL HYSTERECTOMY      Prior to Admission medications   Medication Sig Start Date End Date Taking? Authorizing Provider  acetaminophen (TYLENOL) 325 MG tablet Take 650 mg by mouth every 6 (six) hours as needed for mild pain.   Yes Historical Provider, MD  ADVAIR DISKUS 250-50 MCG/DOSE AEPB Inhale 1 puff into the lungs 2 (two) times daily.  10/05/12  Yes Historical Provider, MD  Calcium Carbonate (CALCIUM 600 PO) Take 1 tablet by mouth daily.     Yes Historical Provider, MD  cilostazol (PLETAL) 100 MG tablet Take 1 tablet by mouth  2 (two) times daily. 03/29/15  Yes Historical Provider, MD  CREON 24000 units CPEP TAKE 2 CAPSULES WITH EACH MEALS AND 1 CAPSULE WITH SNACKS. 10/09/15  Yes Carlis Stable, NP  dronabinol (MARINOL) 2.5 MG capsule Take 2.5 mg by mouth daily.   Yes Historical Provider, MD  DULoxetine (CYMBALTA) 60 MG capsule Take 60 mg by mouth 2 (two) times daily.    Yes Historical Provider, MD  fentaNYL (DURAGESIC - DOSED MCG/HR) 75 MCG/HR Place 1 patch onto the skin every 3 (three)  days.   Yes Historical Provider, MD  hydrOXYzine (ATARAX/VISTARIL) 25 MG tablet Take 1 tablet by mouth as needed. 04/21/16  Yes Historical Provider, MD  LORazepam (ATIVAN) 1 MG tablet Take 1 mg by mouth every 8 (eight) hours as needed for anxiety. Reported on 11/19/2015   Yes Historical Provider, MD  lubiprostone (AMITIZA) 8 MCG capsule Take 8 mcg by mouth 2 (two) times daily with a meal.   Yes Historical Provider, MD  nitroGLYCERIN (NITROSTAT) 0.4 MG SL tablet Place 1 tablet (0.4 mg total) under the tongue every 5 (five) minutes as needed. 11/21/14  Yes Herminio Commons, MD  omeprazole (PRILOSEC) 20 MG capsule TAKE 1 CAPSULE BY MOUTH ONCE DAILY. 01/28/16  Yes Annitta Needs, NP  PROLIA 60 MG/ML SOLN injection Inject as directed every 6 (six) months. 10/31/15  Yes Historical Provider, MD  promethazine (PHENERGAN) 25 MG tablet Take 25 mg by mouth every 6 (six) hours as needed for nausea or vomiting.    Yes Historical Provider, MD  RESTASIS 0.05 % ophthalmic emulsion Place 1 drop into both eyes 2 (two) times daily. 03/27/16  Yes Historical Provider, MD  doxycycline (VIBRA-TABS) 100 MG tablet Take 100 mg by mouth 2 (two) times daily. Reported on 11/19/2015 11/06/15   Historical Provider, MD  fluorometholone (FML) 0.1 % ophthalmic suspension Place 1 drop into both eyes 4 (four) times daily. 10/16/15   Historical Provider, MD  Lifitegrast 5 % SOLN Place 1 drop into both eyes 2 (two) times daily.    Historical Provider, MD  neomycin-polymyxin-hydrocortisone (CORTISPORIN) 3.5-10000-1 otic suspension Place 4 drops into the left ear 3 (three) times daily. Patient not taking: Reported on 11/19/2015 04/20/15   Ottie Glazier, PA-C    Allergies as of 05/19/2016 - Review Complete 05/19/2016  Allergen Reaction Noted  . Ibuprofen Anaphylaxis and Hives 12/19/2008  . Iohexol Hives and Swelling 05/23/2004  . Strawberry (diagnostic) Itching 11/09/2015    Family History  Problem Relation Age of Onset  . Heart attack  Father 14  . Colon cancer Maternal Grandmother     Social History   Social History  . Marital status: Divorced    Spouse name: N/A  . Number of children: 3  . Years of education: N/A   Occupational History  . disabled Unemployed   Social History Main Topics  . Smoking status: Current Every Day Smoker    Packs/day: 1.00    Years: 50.00    Types: Cigarettes    Start date: 07/18/1962  . Smokeless tobacco: Never Used     Comment: 3/4 pack daily  . Alcohol use No     Comment: hx of ETOH about 20 years ago.   . Drug use: No  . Sexual activity: No   Other Topics Concern  . Not on file   Social History Narrative  . No narrative on file    Review of Systems: See HPI, otherwise negative ROS  Physical Exam: BP 105/71  Pulse 93   Temp 98.1 F (36.7 C) (Oral)   Ht 5\' 4"  (1.626 m)   Wt 80 lb 12.8 oz (36.7 kg)   BMI 13.87 kg/m  General:   Alert,  Well-developed, well-nourished, pleasant and cooperative in NAD Skin:  Intact without significant lesions or rashes. Eyes:  Sclera clear, no icterus.   Conjunctiva pink. Ears:  Normal auditory acuity. Nose:  No deformity, discharge,  or lesions. Mouth:  No deformity or lesions. Neck:  Supple; no masses or thyromegaly. No significant cervical adenopathy. Lungs:  Clear throughout to auscultation.   No wheezes, crackles, or rhonchi. No acute distress. Heart:  Regular rate and rhythm; no murmurs, clicks, rubs,  or gallops. Abdomen: Non-distended, normal bowel sounds.  Soft and nontender without appreciable mass or hepatosplenomegaly.  Pulses:  Normal pulses noted. Extremities:  Without clubbing or edema.  Impression:  Chronic abdominal pain multifactorial in origin. History of chronic pancreatitis. Chronic, opioid-induced, constipation. Amitiza did not agree to agree with the patient. She's not been tried on Mobavantik. GERD well-controlled. She's been compliant with PPI and pancreatic enzyme supplements  Right sided right upper  quadrant abdominal pain could may be related to chronic constipation or evolving gallbladder disease, etc.   Recommendations:Stop Amitiza.  Begin Movantik 25 mg daily for the next 2 weeks to see if this helped for constipation - samples provided  Patient is to let us know in 2 weeks how she is doing   Continue omeprazole and pancreatic enzymes daily  Office visit with [place] in 6 weeks  As discussed, if right-sided abdominal pain does not improve with successful management constipation, then further evaluation may be warranted.       Notice: This dictation was prepared with Dragon dictation along with smaller phrase technology. Any transcriptional errors that result from this process are unintentional and may not be corrected upon review.

## 2016-05-19 NOTE — Patient Instructions (Signed)
Stop Amitiza.  Begin Moban take 25 mg daily for the next 2 weeks to see if this helped for constipation  Let us know in 2 weeks her urine doing  Continue omeprazole and pancreatic enzymes daily  Office visit with [place] in 6 weeks  As discussed, if your right-sided abdominal pain does not improve with gettingher bowels to move better, then further evaluation may be warranted.

## 2016-06-02 ENCOUNTER — Telehealth: Payer: Self-pay | Admitting: Internal Medicine

## 2016-06-02 DIAGNOSIS — K861 Other chronic pancreatitis: Secondary | ICD-10-CM

## 2016-06-02 NOTE — Telephone Encounter (Signed)
Spoke with the pt- she said she has been taking the movantik for 2 weeks and it will make her have a bm, usually about 2-3 times a day. For the past few days, she has had up to 5 soft bms that were yellow in color. She has R side abd pain, pain in her stomach and all the way up her back. She has had episodes where she was weak and sweaty and had nausea but no vomiting. No fever. She is concerned about her pancreas as well. Pt wants to know what she should do? Pt is aware that RMR is not here today.

## 2016-06-02 NOTE — Telephone Encounter (Signed)
Patient called and stated that the sample rmr gave her for constipation is making her sick, thinks she has a bowel infection like she has had on the past.  [phone]

## 2016-06-02 NOTE — Telephone Encounter (Signed)
Pt is aware. She said she is now having diarrhea and she wanted to know if we could check some stool studies just to be on the safe side?

## 2016-06-02 NOTE — Telephone Encounter (Signed)
Let's check lipase and CMP. As long as she is not having diarrhea, let's leave Movantik where it is. IF she starts having diarrhea, we can hold it.

## 2016-06-03 ENCOUNTER — Encounter: Payer: Self-pay | Admitting: Internal Medicine

## 2016-06-03 ENCOUNTER — Other Ambulatory Visit: Payer: Self-pay

## 2016-06-03 DIAGNOSIS — K861 Other chronic pancreatitis: Secondary | ICD-10-CM

## 2016-06-03 NOTE — Telephone Encounter (Signed)
Tried to call pt- NA 

## 2016-06-03 NOTE — Telephone Encounter (Signed)
Have her take Movantik every other day.  If diarrhea still an issue in 7 days, would consider consider stool studies

## 2016-06-03 NOTE — Addendum Note (Signed)
Addended by: Claudina Lick on: 06/03/2016 09:28 AM   Modules accepted: Orders

## 2016-06-04 NOTE — Telephone Encounter (Signed)
Tried to call pt- NA 

## 2016-06-09 NOTE — Telephone Encounter (Signed)
Letter mailed to the pt. 

## 2016-06-10 ENCOUNTER — Other Ambulatory Visit: Payer: Self-pay | Admitting: Gastroenterology

## 2016-06-25 ENCOUNTER — Emergency Department (HOSPITAL_COMMUNITY)
Admission: EM | Admit: 2016-06-25 | Discharge: 2016-06-26 | Disposition: A | Discharge status: Home / Self Care | Payer: Medicare Other | Attending: Emergency Medicine | Admitting: Emergency Medicine

## 2016-06-25 ENCOUNTER — Emergency Department (HOSPITAL_COMMUNITY): Payer: Medicare Other

## 2016-06-25 ENCOUNTER — Encounter (HOSPITAL_COMMUNITY): Payer: Self-pay | Admitting: Emergency Medicine

## 2016-06-25 DIAGNOSIS — F17210 Nicotine dependence, cigarettes, uncomplicated: Secondary | ICD-10-CM | POA: Insufficient documentation

## 2016-06-25 DIAGNOSIS — K529 Noninfective gastroenteritis and colitis, unspecified: Secondary | ICD-10-CM | POA: Insufficient documentation

## 2016-06-25 DIAGNOSIS — J449 Chronic obstructive pulmonary disease, unspecified: Secondary | ICD-10-CM | POA: Insufficient documentation

## 2016-06-25 DIAGNOSIS — R1011 Right upper quadrant pain: Secondary | ICD-10-CM | POA: Diagnosis present

## 2016-06-25 DIAGNOSIS — I2510 Atherosclerotic heart disease of native coronary artery without angina pectoris: Secondary | ICD-10-CM | POA: Diagnosis not present

## 2016-06-25 DIAGNOSIS — R109 Unspecified abdominal pain: Secondary | ICD-10-CM

## 2016-06-25 LAB — URINE MICROSCOPIC-ADD ON

## 2016-06-25 LAB — CBC
HEMATOCRIT: 39.2 % (ref 36.0–46.0)
Hemoglobin: 13.8 g/dL (ref 12.0–15.0)
MCH: 32.2 pg (ref 26.0–34.0)
MCHC: 35.2 g/dL (ref 30.0–36.0)
MCV: 91.6 fL (ref 78.0–100.0)
PLATELETS: 259 10*3/uL (ref 150–400)
RBC: 4.28 MIL/uL (ref 3.87–5.11)
RDW: 13.5 % (ref 11.5–15.5)
WBC: 9 10*3/uL (ref 4.0–10.5)

## 2016-06-25 LAB — URINALYSIS, ROUTINE W REFLEX MICROSCOPIC
Bilirubin Urine: NEGATIVE
GLUCOSE, UA: NEGATIVE mg/dL
Ketones, ur: NEGATIVE mg/dL
NITRITE: NEGATIVE
PH: 6 (ref 5.0–8.0)
PROTEIN: NEGATIVE mg/dL
Specific Gravity, Urine: <1.005 — ABNORMAL LOW (ref 1.005–1.030)

## 2016-06-25 LAB — COMPREHENSIVE METABOLIC PANEL
ALT: 12 U/L — ABNORMAL LOW (ref 14–54)
AST: 21 U/L (ref 15–41)
Albumin: 4.3 g/dL (ref 3.5–5.0)
Alkaline Phosphatase: 67 U/L (ref 38–126)
Anion gap: 8 (ref 5–15)
BUN: 8 mg/dL (ref 6–20)
CHLORIDE: 95 mmol/L — AB (ref 101–111)
CO2: 30 mmol/L (ref 22–32)
Calcium: 9.2 mg/dL (ref 8.9–10.3)
Creatinine, Ser: 0.44 mg/dL (ref 0.44–1.00)
Glucose, Bld: 104 mg/dL — ABNORMAL HIGH (ref 65–99)
POTASSIUM: 3 mmol/L — AB (ref 3.5–5.1)
Sodium: 133 mmol/L — ABNORMAL LOW (ref 135–145)
Total Bilirubin: 0.2 mg/dL — ABNORMAL LOW (ref 0.3–1.2)
Total Protein: 7.8 g/dL (ref 6.5–8.1)

## 2016-06-25 LAB — LIPASE, BLOOD: LIPASE: 30 U/L (ref 11–51)

## 2016-06-25 LAB — I-STAT CG4 LACTIC ACID, ED: Lactic Acid, Venous: 1.02 mmol/L (ref 0.5–1.9)

## 2016-06-25 MED ORDER — BARIUM SULFATE 2.1 % PO SUSP
ORAL | Status: AC
  Filled 2016-06-25: qty 2

## 2016-06-25 MED ORDER — CIPROFLOXACIN IN D5W 400 MG/200ML IV SOLN
400.0000 mg | Freq: Once | INTRAVENOUS | Status: AC
  Administered 2016-06-26: 400 mg via INTRAVENOUS
  Filled 2016-06-25: qty 200

## 2016-06-25 MED ORDER — METRONIDAZOLE IN NACL 5-0.79 MG/ML-% IV SOLN
500.0000 mg | Freq: Once | INTRAVENOUS | Status: AC
  Administered 2016-06-26: 500 mg via INTRAVENOUS
  Filled 2016-06-25: qty 100

## 2016-06-25 NOTE — ED Provider Notes (Signed)
[facility] DEPT Provider Note   CSN: [id number] Arrival date & time: 06/25/16  1945 By signing my name below, I, Dyke Brackett, attest that this documentation has been prepared under the direction and in the presence of Ezequiel Essex, MD . Electronically Signed: Dyke Brackett, Scribe. 06/25/2016. 8:37 PM.   History   Chief Complaint Chief Complaint  Patient presents with  . Abdominal Pain    HPI [name] is a 70 y.o. female with hx of pancreatitis and chronic constipation who presents to the Emergency Department complaining of constant, worsening RUQ pain with radiation into her back onset three months ago. Her pain is moderate. She has taken fentanyl patch and tylenol for the pain with no relief. No alleviating or modifying factors noted. Per pt, she has had the pain for years, but it has progressively worsened for the past three months. Pt notes associated nausea, decreased appetite, constipation and generalized body aches. She states "everything hurts". Last BM was 5 days ago. She denies vomiting, fever, hematuria, and dysuria. She denies any alcohol use. PSHx includes appendectomy, but she denies any other abdominal surgeries. Pt is currently followed by Dr. Gala Romney at [facility]. Per medical records, she has had extensive evaluation which includes upper and lower endoscopy, ultrasounds, and CT which have been normal.   The history is provided by the patient. No language interpreter was used.   Past Medical History:  Diagnosis Date  . Allergic rhinitis   . Anxiety   . Anxiety and depression   . Back pain, chronic   . CAD (coronary artery disease)    palpitations, dizziness, chest pain  . Cervical cancer ([place])   . Chronic abdominal pain   . COPD (chronic obstructive pulmonary disease) ([place])   . Depression   . Family history of anesthesia complication   . GERD (gastroesophageal reflux disease)   . Helicobacter pylori infection 2015   treated with  prevpac  . Hematochezia   . Hx of colonic polyps    adenomatous  . Hyperlipidemia    Lipid profile on 02/25/2011: 209, 113, 55, 132  . Nephrolithiasis   . Pancreatitis chronic   . Renal calculus   . Stroke [facility]) 3-4 yrs ago   left sided weakness  . Tobacco abuse   . Tubular adenoma 2015  . Weight loss    CT negative for occult malignancy, negative celiac, negative adrenal insufficiency    Patient Active Problem List   Diagnosis Date Noted  . RUQ pain 10/19/2012  . Chronic nausea 05/07/2012  . Constipation 05/07/2012  . Laboratory test 02/17/2012  . Hyperlipidemia   . Chest pain 01/07/2011  . ANOREXIA 05/27/2010  . WEIGHT LOSS 05/27/2010  . COLONIC POLYPS, ADENOMATOUS, HX OF 12/20/2008  . ANXIETY DEPRESSION 12/19/2008  . Tobacco abuse 12/19/2008  . ALLERGIC RHINITIS, SEASONAL 12/19/2008  . COPD 12/19/2008  . GERD 12/19/2008  . Chronic pancreatitis ([place]) 12/19/2008  . BACK PAIN, CHRONIC 12/19/2008  . ABDOMINAL PAIN 12/19/2008  . CERVICAL CANCER, HX OF 12/19/2008  . Nephrolithiasis 12/19/2008    Past Surgical History:  Procedure Laterality Date  . ABDOMINAL HYSTERECTOMY    . APPENDECTOMY    . CATARACT EXTRACTION W/PHACO Right 05/04/2013   Procedure: CATARACT EXTRACTION PHACO AND INTRAOCULAR LENS PLACEMENT (IOC);  Surgeon: Tonny Branch, MD;  Location: [facility];  Service: Ophthalmology;  Laterality: Right;  CDE:16.17  . CATARACT EXTRACTION W/PHACO Left 05/22/2013   Procedure: CATARACT EXTRACTION PHACO AND INTRAOCULAR LENS PLACEMENT (IOC);  Surgeon: Tonny Branch, MD;  Location: [facility];  Service: Ophthalmology;  Laterality: Left;  CDE:14.75  . COLONOSCOPY N/A 02/08/2014   Dr.Rourk- normal rectum, colonic polyps bx=tubular adenoma  . COLONOSCOPY W/ POLYPECTOMY  2006   [id number], benign gastric nodule, multiple adenomatous polyps, one with tubular morphology  . ESOPHAGOGASTRODUODENOSCOPY  2006   [id number] gastric nodule  . ESOPHAGOGASTRODUODENOSCOPY N/A 02/08/2014   Dr.Rourk-  abnormal stomach and gstric nodule bx= hpylori  . EUS  2010   Dr. Ardis Hughs : Multiple shadowing calcifications in pancreas, consistent with Chronic Pancreatitis.  These are mainly confined to a collection of calcifications in head/uncinate pancreas. Otherwise the pancreatic parenchyma appears fairly normal.  The main pancreatic duct, CBD are both normal without stones, dilation.    . Ileocolonoscopy  01/11/2009   RMR: Polyp at the splenic flexure, status post hot snare removal/ Normal rectum, tubular adenoma  . PARTIAL HYSTERECTOMY      OB History    Gravida Para Term Preterm AB Living   6 3 3   3      SAB TAB Ectopic Multiple Live Births   3               Home Medications    Prior to Admission medications   Medication Sig Start Date End Date Taking? Authorizing Provider  acetaminophen (TYLENOL) 325 MG tablet Take 650 mg by mouth every 6 (six) hours as needed for mild pain.    Historical Provider, MD  ADVAIR DISKUS 250-50 MCG/DOSE AEPB Inhale 1 puff into the lungs 2 (two) times daily.  10/05/12   Historical Provider, MD  Calcium Carbonate (CALCIUM 600 PO) Take 1 tablet by mouth daily.      Historical Provider, MD  cilostazol (PLETAL) 100 MG tablet Take 1 tablet by mouth 2 (two) times daily. 03/29/15   Historical Provider, MD  CREON 24000 units CPEP TAKE 2 CAPSULES WITH EACH MEALS AND 1 CAPSULE WITH SNACKS. 10/09/15   Carlis Stable, NP  doxycycline (VIBRA-TABS) 100 MG tablet Take 100 mg by mouth 2 (two) times daily. Reported on 11/19/2015 11/06/15   Historical Provider, MD  dronabinol (MARINOL) 2.5 MG capsule Take 2.5 mg by mouth daily.    Historical Provider, MD  DULoxetine (CYMBALTA) 60 MG capsule Take 60 mg by mouth 2 (two) times daily.     Historical Provider, MD  fentaNYL (DURAGESIC - DOSED MCG/HR) 75 MCG/HR Place 1 patch onto the skin every 3 (three) days.    Historical Provider, MD  fluorometholone (FML) 0.1 % ophthalmic suspension Place 1 drop into both eyes 4 (four) times daily. 10/16/15    Historical Provider, MD  hydrOXYzine (ATARAX/VISTARIL) 25 MG tablet Take 1 tablet by mouth as needed. 04/21/16   Historical Provider, MD  Lifitegrast 5 % SOLN Place 1 drop into both eyes 2 (two) times daily.    Historical Provider, MD  LORazepam (ATIVAN) 1 MG tablet Take 1 mg by mouth every 8 (eight) hours as needed for anxiety. Reported on 11/19/2015    Historical Provider, MD  lubiprostone (AMITIZA) 8 MCG capsule Take 8 mcg by mouth 2 (two) times daily with a meal.    Historical Provider, MD  neomycin-polymyxin-hydrocortisone (CORTISPORIN) 3.5-10000-1 otic suspension Place 4 drops into the left ear 3 (three) times daily. Patient not taking: Reported on 11/19/2015 04/20/15   Ottie Glazier, PA-C  nitroGLYCERIN (NITROSTAT) 0.4 MG SL tablet Place 1 tablet (0.4 mg total) under the tongue every 5 (five) minutes as needed. 11/21/14   Herminio Commons, MD  omeprazole (PRILOSEC) 20  MG capsule TAKE 1 CAPSULE BY MOUTH ONCE DAILY. 06/10/16   Mahala Menghini, PA-C  PROLIA 60 MG/ML SOLN injection Inject as directed every 6 (six) months. 10/31/15   Historical Provider, MD  promethazine (PHENERGAN) 25 MG tablet Take 25 mg by mouth every 6 (six) hours as needed for nausea or vomiting.     Historical Provider, MD  RESTASIS 0.05 % ophthalmic emulsion Place 1 drop into both eyes 2 (two) times daily. 03/27/16   Historical Provider, MD    Family History Family History  Problem Relation Age of Onset  . Heart attack Father 44  . Colon cancer Maternal Grandmother     Social History Social History  Substance Use Topics  . Smoking status: Current Every Day Smoker    Packs/day: 1.00    Years: 50.00    Types: Cigarettes    Start date: 07/18/1962  . Smokeless tobacco: Never Used     Comment: 3/4 pack daily  . Alcohol use No     Comment: hx of ETOH about 20 years ago.      Allergies   Ibuprofen; Iohexol; and Strawberry (diagnostic)  Review of Systems Review of Systems 10 systems reviewed and all are  negative for acute change except as noted in the HPI.  Physical Exam Updated Vital Signs BP 124/75 (BP Location: Left Arm)   Pulse 98   Temp 97.6 F (36.4 C) (Oral)   Resp 16   SpO2 98%   Physical Exam  Constitutional: She is oriented to person, place, and time. She appears well-developed and well-nourished. No distress.  HENT:  Head: Normocephalic and atraumatic.  Mouth/Throat: Oropharynx is clear and moist. No oropharyngeal exudate.  Eyes: Conjunctivae and EOM are normal. Pupils are equal, round, and reactive to light.  Neck: Normal range of motion. Neck supple.  No meningismus.  Cardiovascular: Normal rate, regular rhythm, normal heart sounds and intact distal pulses.   No murmur heard. Pulmonary/Chest: Effort normal. No respiratory distress. She has decreased breath sounds.  Abdominal: Soft. There is tenderness. There is guarding. There is no rebound.  RUQ and RLQ tenderness with guarding.   Musculoskeletal: Normal range of motion. She exhibits no edema or tenderness.  Neurological: She is alert and oriented to person, place, and time. No cranial nerve deficit. She exhibits normal muscle tone. Coordination normal.   5/5 strength throughout. CN 2-12 intact.Equal grip strength.   Skin: Skin is warm.  Psychiatric: She has a normal mood and affect. Her behavior is normal.  Nursing note and vitals reviewed.  ED Treatments / Results  DIAGNOSTIC STUDIES:  Oxygen Saturation is 98% on RA, normal by my interpretation.    COORDINATION OF CARE:  8:31 PM Will order I-stat CG4 lactic acid, CMP, CBC, urinalysis, and lipase. Discussed treatment plan with pt at bedside and pt agreed to plan.  Labs (all labs ordered are listed, but only abnormal results are displayed) Labs Reviewed  COMPREHENSIVE METABOLIC PANEL - Abnormal; Notable for the following:       Result Value   Sodium 133 (*)    Potassium 3.0 (*)    Chloride 95 (*)    Glucose, Bld 104 (*)    ALT 12 (*)    Total Bilirubin  0.2 (*)    All other components within normal limits  URINALYSIS, ROUTINE W REFLEX MICROSCOPIC (NOT AT [facility]) - Abnormal; Notable for the following:    Color, Urine STRAW (*)    Specific Gravity, Urine <1.005 (*)    Hgb  urine dipstick MODERATE (*)    Leukocytes, UA TRACE (*)    All other components within normal limits  URINE MICROSCOPIC-ADD ON - Abnormal; Notable for the following:    Squamous Epithelial / LPF TOO NUMEROUS TO COUNT (*)    Bacteria, UA FEW (*)    All other components within normal limits  LIPASE, BLOOD  CBC  I-STAT CG4 LACTIC ACID, ED    EKG  EKG Interpretation None       Radiology Ct Abdomen Pelvis Wo Contrast  Result Date: 06/25/2016 CLINICAL DATA:  Pancreatitis. Chronic constipation. Worsening right upper quadrant pain. EXAM: CT ABDOMEN AND PELVIS WITHOUT CONTRAST TECHNIQUE: Multidetector CT imaging of the abdomen and pelvis was performed following the standard protocol without IV contrast. COMPARISON:  03/10/2014 FINDINGS: Lower chest: No acute abnormality. Hepatobiliary: No focal liver abnormality is seen. No gallstones, gallbladder wall thickening, or biliary dilatation. Pancreas: Calcifications of chronic pancreatitis noted. No mass or acute inflammation identified. No pancreatic duct dilatation. Spleen: Normal in size without focal abnormality. Adrenals/Urinary Tract: Adrenal glands are unremarkable. Kidneys are normal, without renal calculi, focal lesion, or hydronephrosis. Bladder is unremarkable. Stomach/Bowel: The stomach is normal. There is no abnormal dilatation or inflammation involving the small bowel loops. Wall thickening and fat stranding involves the ascending colon and transverse colon compatible with colitis. No pneumatosis or bowel perforation identified. No significant free fluid or abnormal fluid collections. Vascular/Lymphatic: Calcified atherosclerotic disease involves the abdominal aorta. No aneurysm. No enlarged retroperitoneal or mesenteric  adenopathy. No enlarged pelvic or inguinal lymph nodes. Reproductive: Status post hysterectomy. No adnexal masses. Other: There is no ascites or focal fluid collections within the abdomen or pelvis. Musculoskeletal: No acute or significant osseous findings. IMPRESSION: 1. Wall thickening and inflammation involves the ascending colon and transverse colon compatible with colitis. This may be inflammatory or infectious in etiology. Ischemic colitis not excluded. However there is no evidence for pneumatosis or bowel perforation. 2. No evidence for bowel obstruction or abscess. 3. Aortic atherosclerosis 4. Changes of chronic pancreatitis. Electronically Signed   By: Kerby Moors M.D.   On: 06/25/2016 23:46    Procedures Procedures (including critical care time)  Medications Ordered in ED Medications - No data to display   Initial Impression / Assessment and Plan / ED Course  I have reviewed the triage vital signs and the nursing notes.  Pertinent labs & imaging results that were available during my care of the patient were reviewed by me and considered in my medical decision making (see chart for details).  Clinical Course  Patient with acute on chronic abdominal pain on the right side that radiates to her back. History of constipation and chronic pancreatitis. Reports "pain all over". Denies fever or vomiting.  Tenderness to the right upper and lower quadrants. Lactate is normal, labs are reassuring  CT scan obtained without contrast due to her allergy. There is thickening of the ascending and transverse colon compatible with colitis. Ischemic colitis not excluded per radiology. However lactate normal, pain has been ongoing for months, ischemic colitis seems less likely.  Treat with antibiotics.Message sent to her GI Dr. Gala Romney. BP has improved with IVF in the ED. 106/73. Patient is very small.  Tolerating PO and ambulatory. Return precautions discussed.  Final Clinical Impressions(s) / ED  Diagnoses   Final diagnoses:  Abdominal pain, unspecified abdominal location  Colitis    New Prescriptions New Prescriptions   No medications on file  I personally performed the services described in this documentation, which  was scribed in my presence. The recorded information has been reviewed and is accurate.    Ezequiel Essex, MD 06/26/16 1100

## 2016-06-25 NOTE — ED Notes (Signed)
Pt ambulated to restroom & returned to room w/ no complications. 

## 2016-06-25 NOTE — ED Triage Notes (Signed)
Pt reports continuing and worsening abdominal pain with radiation into her back. Pt states she has a hx of pancreatitis. Pt has chronic problems with constipation. Pt also reports headache, leg pain and pain in her hands.

## 2016-06-26 DIAGNOSIS — K529 Noninfective gastroenteritis and colitis, unspecified: Secondary | ICD-10-CM | POA: Diagnosis not present

## 2016-06-26 MED ORDER — CIPROFLOXACIN HCL 500 MG PO TABS: 500.0000 mg | ORAL_TABLET | Freq: Two times a day (BID) | ORAL | 0 refills | Status: DC

## 2016-06-26 MED ORDER — METRONIDAZOLE 500 MG PO TABS: 500.0000 mg | ORAL_TABLET | Freq: Two times a day (BID) | ORAL | 0 refills | Status: DC

## 2016-06-26 MED ORDER — SODIUM CHLORIDE 0.9 % IV BOLUS (SEPSIS)
500.0000 mL | Freq: Once | INTRAVENOUS | Status: AC
  Administered 2016-06-26: 500 mL via INTRAVENOUS

## 2016-06-26 NOTE — ED Notes (Signed)
Pt alert & oriented x4, stable gait. Patient given discharge instructions, paperwork & prescription(s). Patient  instructed to stop at the registration desk to finish any additional paperwork. Patient verbalized understanding. Pt left department w/ no further questions. 

## 2016-06-26 NOTE — ED Notes (Signed)
Pt provided drink at this time  

## 2016-06-26 NOTE — Discharge Instructions (Signed)
Take the antibiotics as prescribed and followup with Dr. Gala Romney. Return to the ED if you develop new or worsening symptoms.

## 2016-07-01 ENCOUNTER — Encounter: Payer: Self-pay | Admitting: Gastroenterology

## 2016-07-01 ENCOUNTER — Ambulatory Visit (INDEPENDENT_AMBULATORY_CARE_PROVIDER_SITE_OTHER): Payer: Medicare Other | Admitting: Gastroenterology

## 2016-07-01 VITALS — BP 94/66 | HR 80 | Temp 97.8°F | Ht 63.0 in | Wt 81.0 lb

## 2016-07-01 DIAGNOSIS — K861 Other chronic pancreatitis: Secondary | ICD-10-CM | POA: Diagnosis not present

## 2016-07-01 DIAGNOSIS — K529 Noninfective gastroenteritis and colitis, unspecified: Secondary | ICD-10-CM

## 2016-07-01 DIAGNOSIS — K5900 Constipation, unspecified: Secondary | ICD-10-CM

## 2016-07-01 MED ORDER — PLECANATIDE 3 MG PO TABS: 3.0000 mg | ORAL_TABLET | Freq: Every day | ORAL | 3 refills | Status: DC

## 2016-07-01 NOTE — Patient Instructions (Signed)
1. Start Trulance 3 mg daily. Samples provided. Prescription sent to your pharmacy. 2. To discuss your recent CT findings with Dr. Gala Romney, further recommendations to follow.

## 2016-07-01 NOTE — Progress Notes (Signed)
Primary Care Physician: Robert Bellow, MD  Primary Gastroenterologist:  Garfield Cornea, MD   Chief Complaint  Patient presents with  . Follow-up    HPI: [name] is a 70 y.o. female here for follow up of GERD, chronic pancreatitis, opoid-induced constipation, recent "colitis". Patient last seen in office 04/2016. Start on Movantik at that time. Was doing well but two weeks after starting Movantik she developed diarrhea and right sided abd pain. Patient stopped Movantik but symptoms persisted. In ER, she had CT A/P WITHOUT contrast on 06/25/16. Findings of wall thickening and inflammation involving ascending colon and transverse colon, aortic atherosclerosis and chronic pancreatitis. Started on cipro and flagyl per ER.   Patient complains of ongoing abdominal pain and worsened back pain on top of it. Had to increase pain medication by PCP. No BM in 2 weeks except with 2 enemas. Nausea but no vomiting. No melena, brbpr. Not worse with meals. A lot of gas. Appetite improved on Marinol but she is afraid to eat due to constipation and associated nausea/vomiting.   Tolerated IV contrast if premedicated.     Current Outpatient Prescriptions  Medication Sig Dispense Refill  . acetaminophen (TYLENOL) 325 MG tablet Take 650 mg by mouth every 6 (six) hours as needed for mild pain.    Marland Kitchen ADVAIR DISKUS 250-50 MCG/DOSE AEPB Inhale 1 puff into the lungs 2 (two) times daily.     . Calcium Carbonate (CALCIUM 600 PO) Take 1 tablet by mouth daily.      . cilostazol (PLETAL) 100 MG tablet Take 1 tablet by mouth 2 (two) times daily.    . ciprofloxacin (CIPRO) 500 MG tablet Take 1 tablet (500 mg total) by mouth 2 (two) times daily. 20 tablet 0  . CREON 24000 units CPEP TAKE 2 CAPSULES WITH EACH MEALS AND 1 CAPSULE WITH SNACKS. 240 capsule 11  . dronabinol (MARINOL) 2.5 MG capsule Take 2.5 mg by mouth daily.    . DULoxetine (CYMBALTA) 60 MG capsule Take 60 mg by mouth 2 (two) times daily.       . fentaNYL (DURAGESIC - DOSED MCG/HR) 75 MCG/HR Place 1 patch onto the skin every 3 (three) days.    . hydrOXYzine (ATARAX/VISTARIL) 25 MG tablet Take 1 tablet by mouth 3 (three) times daily as needed for anxiety or itching.     Marland Kitchen LORazepam (ATIVAN) 1 MG tablet Take 1 mg by mouth every 8 (eight) hours as needed for anxiety. Reported on 11/19/2015    . metroNIDAZOLE (FLAGYL) 500 MG tablet Take 1 tablet (500 mg total) by mouth 2 (two) times daily. 20 tablet 0  . nitroGLYCERIN (NITROSTAT) 0.4 MG SL tablet Place 1 tablet (0.4 mg total) under the tongue every 5 (five) minutes as needed. 25 tablet 3  . omeprazole (PRILOSEC) 20 MG capsule TAKE 1 CAPSULE BY MOUTH ONCE DAILY. 30 capsule 11  . PROLIA 60 MG/ML SOLN injection Inject as directed every 6 (six) months.    . promethazine (PHENERGAN) 25 MG tablet Take 25 mg by mouth every 6 (six) hours as needed for nausea or vomiting.     . RESTASIS 0.05 % ophthalmic emulsion Place 1 drop into both eyes 2 (two) times daily.     No current facility-administered medications for this visit.     Allergies as of 07/01/2016 - Review Complete 07/01/2016  Allergen Reaction Noted  . Ibuprofen Anaphylaxis and Hives 12/19/2008  . Iohexol Hives and Swelling 05/23/2004  . Strawberry (diagnostic) Itching  11/09/2015   Past Medical History:  Diagnosis Date  . Allergic rhinitis   . Anxiety   . Anxiety and depression   . Back pain, chronic   . CAD (coronary artery disease)    palpitations, dizziness, chest pain  . Cervical cancer ([place])   . Chronic abdominal pain   . COPD (chronic obstructive pulmonary disease) ([place])   . Depression   . Family history of anesthesia complication   . GERD (gastroesophageal reflux disease)   . Helicobacter pylori infection 2015   treated with prevpac  . Hematochezia   . Hx of colonic polyps    adenomatous  . Hyperlipidemia    Lipid profile on 02/25/2011: 209, 113, 55, 132  . Nephrolithiasis   . Pancreatitis chronic   . Renal  calculus   . Stroke [facility]) 3-4 yrs ago   left sided weakness  . Tobacco abuse   . Tubular adenoma 2015  . Weight loss    CT negative for occult malignancy, negative celiac, negative adrenal insufficiency   Past Surgical History:  Procedure Laterality Date  . ABDOMINAL HYSTERECTOMY    . APPENDECTOMY    . CATARACT EXTRACTION W/PHACO Right 05/04/2013   Procedure: CATARACT EXTRACTION PHACO AND INTRAOCULAR LENS PLACEMENT (IOC);  Surgeon: Tonny Branch, MD;  Location: [facility];  Service: Ophthalmology;  Laterality: Right;  CDE:16.17  . CATARACT EXTRACTION W/PHACO Left 05/22/2013   Procedure: CATARACT EXTRACTION PHACO AND INTRAOCULAR LENS PLACEMENT (IOC);  Surgeon: Tonny Branch, MD;  Location: [facility];  Service: Ophthalmology;  Laterality: Left;  CDE:14.75  . COLONOSCOPY N/A 02/08/2014   Dr.Rourk- normal rectum, colonic polyps bx=tubular adenoma  . COLONOSCOPY W/ POLYPECTOMY  2006   [id number], benign gastric nodule, multiple adenomatous polyps, one with tubular morphology  . ESOPHAGOGASTRODUODENOSCOPY  2006   [id number] gastric nodule  . ESOPHAGOGASTRODUODENOSCOPY N/A 02/08/2014   Dr.Rourk- abnormal stomach and gstric nodule bx= hpylori  . EUS  2010   Dr. Ardis Hughs : Multiple shadowing calcifications in pancreas, consistent with Chronic Pancreatitis.  These are mainly confined to a collection of calcifications in head/uncinate pancreas. Otherwise the pancreatic parenchyma appears fairly normal.  The main pancreatic duct, CBD are both normal without stones, dilation.    . Ileocolonoscopy  01/11/2009   RMR: Polyp at the splenic flexure, status post hot snare removal/ Normal rectum, tubular adenoma  . PARTIAL HYSTERECTOMY     Family History  Problem Relation Age of Onset  . Heart attack Father 26  . Colon cancer Maternal Grandmother    Social History   Social History  . Marital status: Divorced    Spouse name: N/A  . Number of children: 3  . Years of education: N/A   Occupational History  .  disabled Unemployed   Social History Main Topics  . Smoking status: Current Every Day Smoker    Packs/day: 1.00    Years: 50.00    Types: Cigarettes    Start date: 07/18/1962  . Smokeless tobacco: Never Used     Comment: 3/4 pack daily  . Alcohol use No     Comment: hx of ETOH about 20 years ago.   . Drug use: No  . Sexual activity: No   Other Topics Concern  . None   Social History Narrative  . None    ROS:  General: Negative for anorexia, weight loss, fever, chills, fatigue, weakness. ENT: Negative for hoarseness, difficulty swallowing , nasal congestion. CV: Negative for chest pain, angina, palpitations, dyspnea on exertion, peripheral edema.  Respiratory:  Negative for dyspnea at rest, dyspnea on exertion, cough, sputum, wheezing.  GI: See history of present illness. GU:  Negative for dysuria, hematuria, urinary incontinence, urinary frequency, nocturnal urination.  Endo: Negative for unusual weight change.    Physical Examination:   BP 94/66   Pulse 80   Temp 97.8 F (36.6 C) (Oral)   Ht 5\' 3"  (1.6 m)   Wt 81 lb (36.7 kg)   BMI 14.35 kg/m   General: Well-nourished, well-developed in no acute distress.  Eyes: No icterus. Mouth: Oropharyngeal mucosa moist and pink , no lesions erythema or exudate. Lungs: Clear to auscultation bilaterally.  Heart: Regular rate and rhythm, no murmurs rubs or gallops.  Abdomen: Bowel sounds are normal, moderate lower abd tenderness, nondistended, no hepatosplenomegaly or masses, no abdominal bruits or hernia , no rebound or guarding.   Extremities: No lower extremity edema. No clubbing or deformities. Neuro: Alert and oriented x 4   Skin: Warm and dry, no jaundice.   Psych: Alert and cooperative, normal mood and affect.  Labs:  Lab Results  Component Value Date   WBC 9.0 06/25/2016   HGB 13.8 06/25/2016   HCT 39.2 06/25/2016   MCV 91.6 06/25/2016   PLT 259 06/25/2016   Lab Results  Component Value Date   CREATININE 0.44  06/25/2016   BUN 8 06/25/2016   NA 133 (L) 06/25/2016   K 3.0 (L) 06/25/2016   CL 95 (L) 06/25/2016   CO2 30 06/25/2016   Lab Results  Component Value Date   ALT 12 (L) 06/25/2016   AST 21 06/25/2016   ALKPHOS 67 06/25/2016   BILITOT 0.2 (L) 06/25/2016   Lab Results  Component Value Date   LIPASE 30 06/25/2016    Imaging Studies: Ct Abdomen Pelvis Wo Contrast  Result Date: 06/25/2016 CLINICAL DATA:  Pancreatitis. Chronic constipation. Worsening right upper quadrant pain. EXAM: CT ABDOMEN AND PELVIS WITHOUT CONTRAST TECHNIQUE: Multidetector CT imaging of the abdomen and pelvis was performed following the standard protocol without IV contrast. COMPARISON:  03/10/2014 FINDINGS: Lower chest: No acute abnormality. Hepatobiliary: No focal liver abnormality is seen. No gallstones, gallbladder wall thickening, or biliary dilatation. Pancreas: Calcifications of chronic pancreatitis noted. No mass or acute inflammation identified. No pancreatic duct dilatation. Spleen: Normal in size without focal abnormality. Adrenals/Urinary Tract: Adrenal glands are unremarkable. Kidneys are normal, without renal calculi, focal lesion, or hydronephrosis. Bladder is unremarkable. Stomach/Bowel: The stomach is normal. There is no abnormal dilatation or inflammation involving the small bowel loops. Wall thickening and fat stranding involves the ascending colon and transverse colon compatible with colitis. No pneumatosis or bowel perforation identified. No significant free fluid or abnormal fluid collections. Vascular/Lymphatic: Calcified atherosclerotic disease involves the abdominal aorta. No aneurysm. No enlarged retroperitoneal or mesenteric adenopathy. No enlarged pelvic or inguinal lymph nodes. Reproductive: Status post hysterectomy. No adnexal masses. Other: There is no ascites or focal fluid collections within the abdomen or pelvis. Musculoskeletal: No acute or significant osseous findings. IMPRESSION: 1. Wall  thickening and inflammation involves the ascending colon and transverse colon compatible with colitis. This may be inflammatory or infectious in etiology. Ischemic colitis not excluded. However there is no evidence for pneumatosis or bowel perforation. 2. No evidence for bowel obstruction or abscess. 3. Aortic atherosclerosis 4. Changes of chronic pancreatitis. Electronically Signed   By: Kerby Moors M.D.   On: 06/25/2016 23:46

## 2016-07-02 NOTE — Assessment & Plan Note (Signed)
Stable on current regimen   

## 2016-07-02 NOTE — Assessment & Plan Note (Signed)
Off Movantik since recent illness. CT with colitis as outlined, currently on antibiotics for possible infectious etiology. Last TCS 2015. Less likely IBD. Clinically does not seem to be c/w ischemic colitis. Will start trulance 3mg  daily. To discuss further with Dr. Gala Romney. This was noncontrast study. May require CT with contrast after premedication with benadryl and prednisone. Further recommendations to follow.

## 2016-07-03 NOTE — Progress Notes (Signed)
cc'ed to pcp °

## 2016-07-24 ENCOUNTER — Encounter (HOSPITAL_COMMUNITY): Payer: Self-pay | Admitting: Emergency Medicine

## 2016-07-24 ENCOUNTER — Emergency Department (HOSPITAL_COMMUNITY)
Admission: EM | Admit: 2016-07-24 | Discharge: 2016-07-24 | Disposition: A | Discharge status: Home / Self Care | Payer: Medicare Other | Attending: Emergency Medicine | Admitting: Emergency Medicine

## 2016-07-24 DIAGNOSIS — Z79899 Other long term (current) drug therapy: Secondary | ICD-10-CM | POA: Insufficient documentation

## 2016-07-24 DIAGNOSIS — J449 Chronic obstructive pulmonary disease, unspecified: Secondary | ICD-10-CM | POA: Insufficient documentation

## 2016-07-24 DIAGNOSIS — K130 Diseases of lips: Secondary | ICD-10-CM | POA: Insufficient documentation

## 2016-07-24 DIAGNOSIS — I2510 Atherosclerotic heart disease of native coronary artery without angina pectoris: Secondary | ICD-10-CM | POA: Insufficient documentation

## 2016-07-24 DIAGNOSIS — H6592 Unspecified nonsuppurative otitis media, left ear: Secondary | ICD-10-CM

## 2016-07-24 DIAGNOSIS — F17210 Nicotine dependence, cigarettes, uncomplicated: Secondary | ICD-10-CM | POA: Diagnosis not present

## 2016-07-24 DIAGNOSIS — H65192 Other acute nonsuppurative otitis media, left ear: Secondary | ICD-10-CM | POA: Diagnosis not present

## 2016-07-24 MED ORDER — CEFDINIR 300 MG PO CAPS: 300.0000 mg | ORAL_CAPSULE | Freq: Two times a day (BID) | ORAL | 0 refills | Status: DC

## 2016-07-24 NOTE — ED Provider Notes (Signed)
[facility] DEPT Provider Note   CSN: [id number] Arrival date & time: 07/24/16  1744     History   Chief Complaint Chief Complaint  Patient presents with  . Abscess    HPI [name] is a 70 y.o. female who presents emergency Department with chief complaint of abscess to the right upper lip. She has  has a past medical history of Allergic rhinitis; Anxiety; Anxiety and depression; Back pain, chronic; CAD (coronary artery disease); Cervical cancer ([place]); Chronic abdominal pain; COPD (chronic obstructive pulmonary disease) ([place]); Depression; Family history of anesthesia complication; GERD (gastroesophageal reflux disease); Helicobacter pylori infection (2015); Hematochezia; colonic polyps; Hyperlipidemia; Nephrolithiasis; Pancreatitis chronic; Renal calculus; Stroke ([place]) (3-4 yrs ago); Tobacco abuse; Tubular adenoma (2015); and Weight loss.  Patient states that she has had this occur before. It has been present on her right upper lip for the past 2 weeks. She states it was much bigger, but has drained some purulent drainage. She states it is more painful now and she is also complaining of pain in her left ear with popping and irritation in her gums.  HPI  Past Medical History:  Diagnosis Date  . Allergic rhinitis   . Anxiety   . Anxiety and depression   . Back pain, chronic   . CAD (coronary artery disease)    palpitations, dizziness, chest pain  . Cervical cancer ([place])   . Chronic abdominal pain   . COPD (chronic obstructive pulmonary disease) ([place])   . Depression   . Family history of anesthesia complication   . GERD (gastroesophageal reflux disease)   . Helicobacter pylori infection 2015   treated with prevpac  . Hematochezia   . Hx of colonic polyps    adenomatous  . Hyperlipidemia    Lipid profile on 02/25/2011: 209, 113, 55, 132  . Nephrolithiasis   . Pancreatitis chronic   . Renal calculus   . Stroke [facility]) 3-4 yrs ago   left sided weakness  . Tobacco  abuse   . Tubular adenoma 2015  . Weight loss    CT negative for occult malignancy, negative celiac, negative adrenal insufficiency    Patient Active Problem List   Diagnosis Date Noted  . Colitis 07/01/2016  . RUQ pain 10/19/2012  . Chronic nausea 05/07/2012  . Constipation 05/07/2012  . Laboratory test 02/17/2012  . Hyperlipidemia   . Chest pain 01/07/2011  . ANOREXIA 05/27/2010  . WEIGHT LOSS 05/27/2010  . COLONIC POLYPS, ADENOMATOUS, HX OF 12/20/2008  . ANXIETY DEPRESSION 12/19/2008  . Tobacco abuse 12/19/2008  . ALLERGIC RHINITIS, SEASONAL 12/19/2008  . COPD 12/19/2008  . GERD 12/19/2008  . Chronic pancreatitis ([place]) 12/19/2008  . BACK PAIN, CHRONIC 12/19/2008  . ABDOMINAL PAIN 12/19/2008  . CERVICAL CANCER, HX OF 12/19/2008  . Nephrolithiasis 12/19/2008    Past Surgical History:  Procedure Laterality Date  . ABDOMINAL HYSTERECTOMY    . APPENDECTOMY    . CATARACT EXTRACTION W/PHACO Right 05/04/2013   Procedure: CATARACT EXTRACTION PHACO AND INTRAOCULAR LENS PLACEMENT (IOC);  Surgeon: Tonny Branch, MD;  Location: [facility];  Service: Ophthalmology;  Laterality: Right;  CDE:16.17  . CATARACT EXTRACTION W/PHACO Left 05/22/2013   Procedure: CATARACT EXTRACTION PHACO AND INTRAOCULAR LENS PLACEMENT (IOC);  Surgeon: Tonny Branch, MD;  Location: [facility];  Service: Ophthalmology;  Laterality: Left;  CDE:14.75  . COLONOSCOPY N/A 02/08/2014   Dr.Rourk- normal rectum, colonic polyps bx=tubular adenoma  . COLONOSCOPY W/ POLYPECTOMY  2006   [id number], benign gastric nodule, multiple adenomatous polyps,  one with tubular morphology  . ESOPHAGOGASTRODUODENOSCOPY  2006   [id number] gastric nodule  . ESOPHAGOGASTRODUODENOSCOPY N/A 02/08/2014   Dr.Rourk- abnormal stomach and gstric nodule bx= hpylori  . EUS  2010   Dr. Ardis Hughs : Multiple shadowing calcifications in pancreas, consistent with Chronic Pancreatitis.  These are mainly confined to a collection of calcifications in head/uncinate  pancreas. Otherwise the pancreatic parenchyma appears fairly normal.  The main pancreatic duct, CBD are both normal without stones, dilation.    . Ileocolonoscopy  01/11/2009   RMR: Polyp at the splenic flexure, status post hot snare removal/ Normal rectum, tubular adenoma  . PARTIAL HYSTERECTOMY      OB History    Gravida Para Term Preterm AB Living   6 3 3   3      SAB TAB Ectopic Multiple Live Births   3               Home Medications    Prior to Admission medications   Medication Sig Start Date End Date Taking? Authorizing Provider  acetaminophen (TYLENOL) 325 MG tablet Take 650 mg by mouth every 6 (six) hours as needed for mild pain.    Historical Provider, MD  ADVAIR DISKUS 250-50 MCG/DOSE AEPB Inhale 1 puff into the lungs 2 (two) times daily.  10/05/12   Historical Provider, MD  Calcium Carbonate (CALCIUM 600 PO) Take 1 tablet by mouth daily.      Historical Provider, MD  cefdinir (OMNICEF) 300 MG capsule Take 1 capsule (300 mg total) by mouth 2 (two) times daily. 07/24/16   Margarita Mail, PA-C  cilostazol (PLETAL) 100 MG tablet Take 1 tablet by mouth 2 (two) times daily. 03/29/15   Historical Provider, MD  ciprofloxacin (CIPRO) 500 MG tablet Take 1 tablet (500 mg total) by mouth 2 (two) times daily. 06/26/16   Ezequiel Essex, MD  CREON 24000 units CPEP TAKE 2 CAPSULES WITH EACH MEALS AND 1 CAPSULE WITH SNACKS. 10/09/15   Carlis Stable, NP  dronabinol (MARINOL) 2.5 MG capsule Take 2.5 mg by mouth daily.    Historical Provider, MD  DULoxetine (CYMBALTA) 60 MG capsule Take 60 mg by mouth 2 (two) times daily.     Historical Provider, MD  fentaNYL (DURAGESIC - DOSED MCG/HR) 75 MCG/HR Place 1 patch onto the skin every 3 (three) days.    Historical Provider, MD  hydrOXYzine (ATARAX/VISTARIL) 25 MG tablet Take 1 tablet by mouth 3 (three) times daily as needed for anxiety or itching.  04/21/16   Historical Provider, MD  LORazepam (ATIVAN) 1 MG tablet Take 1 mg by mouth every 8 (eight) hours as  needed for anxiety. Reported on 11/19/2015    Historical Provider, MD  metroNIDAZOLE (FLAGYL) 500 MG tablet Take 1 tablet (500 mg total) by mouth 2 (two) times daily. 06/26/16   Ezequiel Essex, MD  nitroGLYCERIN (NITROSTAT) 0.4 MG SL tablet Place 1 tablet (0.4 mg total) under the tongue every 5 (five) minutes as needed. 11/21/14   Herminio Commons, MD  omeprazole (PRILOSEC) 20 MG capsule TAKE 1 CAPSULE BY MOUTH ONCE DAILY. 06/10/16   Mahala Menghini, PA-C  Plecanatide (TRULANCE) 3 MG TABS Take 3 mg by mouth daily. 07/01/16   Mahala Menghini, PA-C  PROLIA 60 MG/ML SOLN injection Inject as directed every 6 (six) months. 10/31/15   Historical Provider, MD  promethazine (PHENERGAN) 25 MG tablet Take 25 mg by mouth every 6 (six) hours as needed for nausea or vomiting.     Historical Provider,  MD  RESTASIS 0.05 % ophthalmic emulsion Place 1 drop into both eyes 2 (two) times daily. 03/27/16   Historical Provider, MD    Family History Family History  Problem Relation Age of Onset  . Heart attack Father 25  . Colon cancer Maternal Grandmother     Social History Social History  Substance Use Topics  . Smoking status: Current Every Day Smoker    Packs/day: 1.00    Years: 50.00    Types: Cigarettes    Start date: 07/18/1962  . Smokeless tobacco: Never Used     Comment: 3/4 pack daily  . Alcohol use No     Comment: hx of ETOH about 20 years ago.      Allergies   Ibuprofen; Iohexol; and Strawberry (diagnostic)   Review of Systems Review of Systems  Ten systems reviewed and are negative for acute change, except as noted in the HPI.   Physical Exam Updated Vital Signs BP 101/72 (BP Location: Left Arm)   Pulse 94   Temp 97.7 F (36.5 C) (Oral)   Resp 20   Ht 5\' 4"  (1.626 m)   Wt 36.3 kg   SpO2 98%   BMI 13.73 kg/m   Physical Exam  Constitutional: She is oriented to person, place, and time. She appears well-developed and well-nourished. No distress.  Appears much older than stated  age   HENT:  Head: Normocephalic and atraumatic.    Edentulous  Gums are pink, nontender without lesions or other signs of infection,.  L TM with air fluid levels  Eyes: Conjunctivae are normal. No scleral icterus.  Neck: Normal range of motion.  Cardiovascular: Normal rate, regular rhythm and normal heart sounds.  Exam reveals no gallop and no friction rub.   No murmur heard. Pulmonary/Chest: Effort normal and breath sounds normal. No respiratory distress.  Abdominal: Soft. Bowel sounds are normal. She exhibits no distension and no mass. There is no tenderness. There is no guarding.  Neurological: She is alert and oriented to person, place, and time.  Skin: Skin is warm and dry. She is not diaphoretic.     ED Treatments / Results  Labs (all labs ordered are listed, but only abnormal results are displayed) Labs Reviewed - No data to display  EKG  EKG Interpretation None       Radiology No results found.  Procedures Procedures (including critical care time)  Medications Ordered in ED Medications - No data to display   Initial Impression / Assessment and Plan / ED Course  I have reviewed the triage vital signs and the nursing notes.  Pertinent labs & imaging results that were available during my care of the patient were reviewed by me and considered in my medical decision making (see chart for details).  Clinical Course    Patient with suspicious lesion on the right upper lip.  Concern for potential squamous cell carcinoma. Given her history of heavy tobacco abuse. The patient is encouraged to use warm wet compresses on the lip. I will discharge her with a referral to a dermatologist. I have given the patient and order for Cefdinir to cover for both her ear infection and skin infection. Discussed return return precautions.Patient seen in shared visit with attending physician. Who agrees with assessment, work up , treatment, and plan for discharge   Final  Clinical Impressions(s) / ED Diagnoses   Final diagnoses:  Lip lesion  OME (otitis media with effusion), left    New Prescriptions Discharge Medication List as  of 07/24/2016  6:44 PM    START taking these medications   Details  cefdinir (OMNICEF) 300 MG capsule Take 1 capsule (300 mg total) by mouth 2 (two) times daily., Starting Fri 07/24/2016, Print         Margarita Mail, PA-C 07/24/16 [place] Duo Liu, MD 07/24/16 [id number]    Forde Dandy, MD 07/24/16 [id number]

## 2016-07-24 NOTE — Discharge Instructions (Signed)
° ° °  SEEK MEDICAL CARE IF:  You have increased pain, swelling, redness, fluid drainage, or bleeding. You have muscle aches, chills, or a general ill feeling. You have a fever.

## 2016-07-24 NOTE — ED Triage Notes (Addendum)
Pt c/o abscess above upper lip x 2 weeks. Pt also c/o left ear pain.

## 2016-09-14 ENCOUNTER — Ambulatory Visit (HOSPITAL_COMMUNITY)
Admission: RE | Admit: 2016-09-14 | Discharge: 2016-09-14 | Disposition: A | Discharge status: Home / Self Care | Payer: Medicare Other | Source: Ambulatory Visit | Attending: Family Medicine | Admitting: Family Medicine

## 2016-09-14 ENCOUNTER — Other Ambulatory Visit (HOSPITAL_COMMUNITY): Payer: Self-pay | Admitting: Family Medicine

## 2016-09-14 DIAGNOSIS — I700 Atherosclerosis of aorta: Secondary | ICD-10-CM | POA: Diagnosis not present

## 2016-09-14 DIAGNOSIS — M5442 Lumbago with sciatica, left side: Secondary | ICD-10-CM

## 2016-09-18 ENCOUNTER — Other Ambulatory Visit (HOSPITAL_COMMUNITY): Payer: Self-pay | Admitting: Family Medicine

## 2016-09-18 DIAGNOSIS — G8929 Other chronic pain: Secondary | ICD-10-CM

## 2016-09-18 DIAGNOSIS — M545 Low back pain: Principal | ICD-10-CM

## 2016-09-30 ENCOUNTER — Ambulatory Visit (HOSPITAL_COMMUNITY): Payer: Medicare Other

## 2016-10-02 ENCOUNTER — Ambulatory Visit (HOSPITAL_COMMUNITY)
Admission: RE | Admit: 2016-10-02 | Discharge: 2016-10-02 | Disposition: A | Discharge status: Home / Self Care | Payer: Medicare Other | Source: Ambulatory Visit | Attending: Family Medicine | Admitting: Family Medicine

## 2016-10-02 DIAGNOSIS — M5136 Other intervertebral disc degeneration, lumbar region: Secondary | ICD-10-CM | POA: Diagnosis not present

## 2016-10-02 DIAGNOSIS — M545 Low back pain: Secondary | ICD-10-CM | POA: Insufficient documentation

## 2016-10-02 DIAGNOSIS — G8929 Other chronic pain: Secondary | ICD-10-CM | POA: Diagnosis not present

## 2016-10-02 DIAGNOSIS — M47816 Spondylosis without myelopathy or radiculopathy, lumbar region: Secondary | ICD-10-CM | POA: Insufficient documentation

## 2016-10-14 NOTE — Progress Notes (Signed)
Would recommend f/u OV for colitis, abd pain. Following up on loose ends, may require CTA A/P based on how patient is doing. Please schedule OV within next 2-3 weeks.

## 2016-10-19 ENCOUNTER — Encounter: Payer: Self-pay | Admitting: Gastroenterology

## 2016-10-19 NOTE — Progress Notes (Signed)
APPT MADE AND LETTER SENT  °

## 2016-11-06 ENCOUNTER — Ambulatory Visit (INDEPENDENT_AMBULATORY_CARE_PROVIDER_SITE_OTHER): Payer: Medicare Other | Admitting: Internal Medicine

## 2016-11-06 ENCOUNTER — Encounter: Payer: Self-pay | Admitting: Internal Medicine

## 2016-11-06 VITALS — BP 119/80 | HR 96 | Temp 97.9°F | Ht 64.0 in | Wt 77.6 lb

## 2016-11-06 DIAGNOSIS — K861 Other chronic pancreatitis: Secondary | ICD-10-CM

## 2016-11-06 DIAGNOSIS — K219 Gastro-esophageal reflux disease without esophagitis: Secondary | ICD-10-CM | POA: Diagnosis not present

## 2016-11-06 DIAGNOSIS — K5903 Drug induced constipation: Secondary | ICD-10-CM | POA: Diagnosis not present

## 2016-11-06 NOTE — Patient Instructions (Signed)
Stop Trulance  Trial of Linzess 72 daily x 2 weeks  Continue omeprazole 20 mg daily along with pancreatic enzyme supplememnts  Telephone follow-up 2 weeks  Office visit with 3 months

## 2016-11-06 NOTE — Progress Notes (Signed)
Primary Care Physician:  Mylik Pro Bellow, MD Primary Gastroenterologist:  Dr. Gala Romney  Pre-Procedure History & Physical: HPI:  [name] is a 71 y.o. female here for  follow-up of GERD chronic pancreatitis and constipation. Seeing Dr. Karie Kirks as primary care physician now. He is cutting back on patients opioids. She is having a lot more musculoskeletal type pain. Significantly constipated. There is typically about a week without a bowel movement and then has multiple bowel movements over 2 days and then the cycle repeats. Weight is down 4 pounds since October 2017 visit. No bleeding. Continues taking PPI pancreatic enzyme supplements. Doesn't like trulance. Doesn't feel is working. Previously, patient felt Mobavintik was initially effective but then it stopped working. Seen in the ED last year for abdominal pain. Felt to have colitis on CT. Treated with Cipro and Flagyl. Those symptoms have resolved. Due for surveillance colonoscopy 2020-history of adenoma.  Past Medical History:  Diagnosis Date  . Allergic rhinitis   . Anxiety   . Anxiety and depression   . Back pain, chronic   . CAD (coronary artery disease)    palpitations, dizziness, chest pain  . Cervical cancer ([place])   . Chronic abdominal pain   . COPD (chronic obstructive pulmonary disease) ([place])   . Depression   . Family history of anesthesia complication   . GERD (gastroesophageal reflux disease)   . Helicobacter pylori infection 2015   treated with prevpac  . Hematochezia   . Hx of colonic polyps    adenomatous  . Hyperlipidemia    Lipid profile on 02/25/2011: 209, 113, 55, 132  . Nephrolithiasis   . Pancreatitis chronic   . Renal calculus   . Stroke [facility]) 3-4 yrs ago   left sided weakness  . Tobacco abuse   . Tubular adenoma 2015  . Weight loss    CT negative for occult malignancy, negative celiac, negative adrenal insufficiency    Past Surgical History:  Procedure Laterality Date  . ABDOMINAL  HYSTERECTOMY    . APPENDECTOMY    . CATARACT EXTRACTION W/PHACO Right 05/04/2013   Procedure: CATARACT EXTRACTION PHACO AND INTRAOCULAR LENS PLACEMENT (IOC);  Surgeon: Tonny Branch, MD;  Location: [facility];  Service: Ophthalmology;  Laterality: Right;  CDE:16.17  . CATARACT EXTRACTION W/PHACO Left 05/22/2013   Procedure: CATARACT EXTRACTION PHACO AND INTRAOCULAR LENS PLACEMENT (IOC);  Surgeon: Tonny Branch, MD;  Location: [facility];  Service: Ophthalmology;  Laterality: Left;  CDE:14.75  . COLONOSCOPY N/A 02/08/2014   Dr.Joshoa Shawler- normal rectum, colonic polyps bx=tubular adenoma  . COLONOSCOPY W/ POLYPECTOMY  2006   [id number], benign gastric nodule, multiple adenomatous polyps, one with tubular morphology  . ESOPHAGOGASTRODUODENOSCOPY  2006   [id number] gastric nodule  . ESOPHAGOGASTRODUODENOSCOPY N/A 02/08/2014   Dr.Jillisa Harris- abnormal stomach and gstric nodule bx= hpylori  . EUS  2010   Dr. Ardis Hughs : Multiple shadowing calcifications in pancreas, consistent with Chronic Pancreatitis.  These are mainly confined to a collection of calcifications in head/uncinate pancreas. Otherwise the pancreatic parenchyma appears fairly normal.  The main pancreatic duct, CBD are both normal without stones, dilation.    . Ileocolonoscopy  01/11/2009   RMR: Polyp at the splenic flexure, status post hot snare removal/ Normal rectum, tubular adenoma  . PARTIAL HYSTERECTOMY      Prior to Admission medications   Medication Sig Start Date End Date Taking? Authorizing Provider  acetaminophen (TYLENOL) 325 MG tablet Take 650 mg by mouth every 6 (six) hours as needed for mild pain.  Yes Historical Provider, MD  ADVAIR DISKUS 250-50 MCG/DOSE AEPB Inhale 1 puff into the lungs 2 (two) times daily.  10/05/12  Yes Historical Provider, MD  Calcium Carbonate (CALCIUM 600 PO) Take 1 tablet by mouth daily.     Yes Historical Provider, MD  cefdinir (OMNICEF) 300 MG capsule Take 1 capsule (300 mg total) by mouth 2 (two) times daily. 07/24/16   Yes Margarita Mail, PA-C  cilostazol (PLETAL) 100 MG tablet Take 1 tablet by mouth 2 (two) times daily. 03/29/15  Yes Historical Provider, MD  CREON 24000 units CPEP TAKE 2 CAPSULES WITH EACH MEALS AND 1 CAPSULE WITH SNACKS. 10/09/15  Yes Carlis Stable, NP  dronabinol (MARINOL) 2.5 MG capsule Take 2.5 mg by mouth daily.   Yes Historical Provider, MD  DULoxetine (CYMBALTA) 60 MG capsule Take 60 mg by mouth 2 (two) times daily.    Yes Historical Provider, MD  fentaNYL (DURAGESIC - DOSED MCG/HR) 50 MCG/HR  11/05/16  Yes Historical Provider, MD  hydrOXYzine (ATARAX/VISTARIL) 25 MG tablet Take 1 tablet by mouth 3 (three) times daily as needed for anxiety or itching.  04/21/16  Yes Historical Provider, MD  LORazepam (ATIVAN) 1 MG tablet Take 1 mg by mouth every 8 (eight) hours as needed for anxiety. Reported on 11/19/2015   Yes Historical Provider, MD  nitroGLYCERIN (NITROSTAT) 0.4 MG SL tablet Place 1 tablet (0.4 mg total) under the tongue every 5 (five) minutes as needed. 11/21/14  Yes Herminio Commons, MD  omeprazole (PRILOSEC) 20 MG capsule TAKE 1 CAPSULE BY MOUTH ONCE DAILY. 06/10/16  Yes Mahala Menghini, PA-C  Plecanatide (TRULANCE) 3 MG TABS Take 3 mg by mouth daily. 07/01/16  Yes Mahala Menghini, PA-C  PROLIA 60 MG/ML SOLN injection Inject as directed every 6 (six) months. 10/31/15  Yes Historical Provider, MD  promethazine (PHENERGAN) 25 MG tablet Take 25 mg by mouth every 6 (six) hours as needed for nausea or vomiting.    Yes Historical Provider, MD  RESTASIS 0.05 % ophthalmic emulsion Place 1 drop into both eyes 2 (two) times daily. 03/27/16  Yes Historical Provider, MD  ciprofloxacin (CIPRO) 500 MG tablet Take 1 tablet (500 mg total) by mouth 2 (two) times daily. Patient not taking: Reported on 11/06/2016 06/26/16   Ezequiel Essex, MD  metroNIDAZOLE (FLAGYL) 500 MG tablet Take 1 tablet (500 mg total) by mouth 2 (two) times daily. Patient not taking: Reported on 11/06/2016 06/26/16   Ezequiel Essex, MD     Allergies as of 11/06/2016 - Review Complete 11/06/2016  Allergen Reaction Noted  . Ibuprofen Anaphylaxis and Hives 12/19/2008  . Iohexol Hives and Swelling 05/23/2004  . Strawberry (diagnostic) Itching 11/09/2015    Family History  Problem Relation Age of Onset  . Heart attack Father 48  . Colon cancer Maternal Grandmother     Social History   Social History  . Marital status: Divorced    Spouse name: N/A  . Number of children: 3  . Years of education: N/A   Occupational History  . disabled Unemployed   Social History Main Topics  . Smoking status: Current Every Day Smoker    Packs/day: 1.00    Years: 50.00    Types: Cigarettes    Start date: 07/18/1962  . Smokeless tobacco: Never Used     Comment: 3/4 pack daily  . Alcohol use No     Comment: hx of ETOH about 20 years ago.   . Drug use: No  . Sexual activity:  No   Other Topics Concern  . Not on file   Social History Narrative  . No narrative on file    Review of Systems: See HPI, otherwise negative ROS  Physical Exam: BP 119/80   Pulse 96   Temp 97.9 F (36.6 C) (Oral)   Ht '5\' 4"'$  (1.626 m)   Wt 77 lb 9.6 oz (35.2 kg)   BMI 13.32 kg/m  General:   Cachectic appearing/chronically ill-appearing (at baseline) pleasant and cooperative in NAD Abdomen: Non-distended, normal bowel sounds.  Soft and nontender without appreciable mass or hepatosplenomegaly.  Pulses:  Normal pulses noted. Extremities:  Without clubbing or edema.  Impression:  GERD/chronic constipation -  opioid-induced  -  chronic calcific pancreatitis. Agents given previously to combat constipation have not met been met with any long-standing success. Opioid dosing being adjusted downward at this time. That makes establishing a steady state even more challenging.  Additional weight loss is somewhat bothersome.  This may also be to ratcheting back on narcotics.  Recent bout of colitis last year. History of colonic adenoma. Compliant with  acid suppression therapy and pancreatic enzyme supplements. No need to reimage at this time.  Recommendations:  Stop Trulance  Trial of Linzess 72 daily x 2 weeks  Continue omeprazole 20 mg daily along with pancreatic enzyme supplememnts  Telephone follow-up 2 weeks  Office visit with 3 months       Notice: This dictation was prepared with Dragon dictation along with smaller phrase technology. Any transcriptional errors that result from this process are unintentional and may not be corrected upon review.

## 2016-11-10 ENCOUNTER — Ambulatory Visit: Payer: Medicare Other | Admitting: Gastroenterology

## 2016-11-28 ENCOUNTER — Other Ambulatory Visit: Payer: Self-pay | Admitting: Nurse Practitioner

## 2016-11-29 ENCOUNTER — Telehealth: Payer: Self-pay | Admitting: Internal Medicine
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
## 2016-11-30 ENCOUNTER — Telehealth: Payer: Self-pay | Admitting: Internal Medicine

## 2016-11-30 DIAGNOSIS — K5900 Constipation, unspecified: Secondary | ICD-10-CM

## 2016-11-30 NOTE — Telephone Encounter (Signed)
PATIENT WOULD LIKE LINZESS  CALLED INTO [facility], SAID SAMPLES WORKED

## 2016-11-30 NOTE — Telephone Encounter (Signed)
MADE PATIENT AN APPOINTMENT AND CALLED HER AND LEFT A MESSAGE ABOUT DATE AND TIME

## 2016-11-30 NOTE — Telephone Encounter (Signed)
Pt was given linzess 64mcg by RMR.

## 2016-12-01 MED ORDER — LINACLOTIDE 72 MCG PO CAPS: 72.0000 ug | ORAL_CAPSULE | Freq: Every day | ORAL | 5 refills | Status: DC

## 2016-12-01 NOTE — Addendum Note (Signed)
Addended by: Gordy Levan, Ozie Dimaria A on: 12/01/2016 10:14 AM   Modules accepted: Orders

## 2016-12-01 NOTE — Telephone Encounter (Signed)
Pt is aware.  

## 2016-12-01 NOTE — Telephone Encounter (Signed)
Linzess was sent to the pharmacy per patient request. Please notify patient. Call if any questions or problems.

## 2016-12-08 ENCOUNTER — Ambulatory Visit: Payer: Medicare Other | Admitting: Gastroenterology

## 2016-12-08 ENCOUNTER — Encounter: Payer: Self-pay | Admitting: Gastroenterology

## 2016-12-08 ENCOUNTER — Telehealth: Payer: Self-pay | Admitting: Gastroenterology

## 2016-12-08 NOTE — Telephone Encounter (Signed)
PATIENT WAS A NO SHOW AND LETTER SENT  °

## 2016-12-15 ENCOUNTER — Encounter (HOSPITAL_COMMUNITY): Payer: Self-pay

## 2016-12-15 ENCOUNTER — Emergency Department (HOSPITAL_COMMUNITY)
Admission: EM | Admit: 2016-12-15 | Discharge: 2016-12-15 | Disposition: A | Discharge status: Home / Self Care | Payer: Medicare Other | Attending: Emergency Medicine | Admitting: Emergency Medicine

## 2016-12-15 ENCOUNTER — Emergency Department (HOSPITAL_COMMUNITY): Payer: Medicare Other

## 2016-12-15 DIAGNOSIS — F17210 Nicotine dependence, cigarettes, uncomplicated: Secondary | ICD-10-CM | POA: Diagnosis not present

## 2016-12-15 DIAGNOSIS — H7092 Unspecified mastoiditis, left ear: Secondary | ICD-10-CM | POA: Diagnosis not present

## 2016-12-15 DIAGNOSIS — I2510 Atherosclerotic heart disease of native coronary artery without angina pectoris: Secondary | ICD-10-CM | POA: Insufficient documentation

## 2016-12-15 DIAGNOSIS — Z79899 Other long term (current) drug therapy: Secondary | ICD-10-CM | POA: Diagnosis not present

## 2016-12-15 DIAGNOSIS — Z8541 Personal history of malignant neoplasm of cervix uteri: Secondary | ICD-10-CM | POA: Diagnosis not present

## 2016-12-15 DIAGNOSIS — J449 Chronic obstructive pulmonary disease, unspecified: Secondary | ICD-10-CM | POA: Insufficient documentation

## 2016-12-15 DIAGNOSIS — H9202 Otalgia, left ear: Secondary | ICD-10-CM

## 2016-12-15 MED ORDER — AMOXICILLIN-POT CLAVULANATE 875-125 MG PO TABS
1.0000 | ORAL_TABLET | Freq: Once | ORAL | Status: AC
  Administered 2016-12-15: 1 via ORAL
  Filled 2016-12-15: qty 1

## 2016-12-15 MED ORDER — AMOXICILLIN-POT CLAVULANATE 875-125 MG PO TABS: 1.0000 | ORAL_TABLET | Freq: Two times a day (BID) | ORAL | 0 refills | Status: DC

## 2016-12-15 NOTE — ED Provider Notes (Signed)
[facility] DEPT Provider Note   CSN: [id number] Arrival date & time: 12/15/16  2924     History   Chief Complaint Chief Complaint  Patient presents with  . Otalgia    HPI Candace IMLER is a 71 y.o. female presenting with a now 5 week history of left ear pain, described as a constant throbbing discomfort with radiation into her left jaw and teeth.  She was seen by her pcp for this and was placed on a z pack which she completed this week without relief.  She contacted her pcp and was told it was probably a viral infection and would resolve with time.  She presents for a recheck. She describes a distant history of a cyst in this ear and is concerned about return of this structure.  She denies tinnitus, fevers, chills, decreased hearing acuity, headache, dizziness, dental pain.  Additionally she denies neck, shoulder and chest pain.  Her pain is constant but worsens with chewing. She has found no alleviators.  The history is provided by the patient.    Past Medical History:  Diagnosis Date  . Allergic rhinitis   . Anxiety   . Anxiety and depression   . Back pain, chronic   . CAD (coronary artery disease)    palpitations, dizziness, chest pain  . Cervical cancer ([place])   . Chronic abdominal pain   . COPD (chronic obstructive pulmonary disease) ([place])   . Depression   . Family history of anesthesia complication   . GERD (gastroesophageal reflux disease)   . Helicobacter pylori infection 2015   treated with prevpac  . Hematochezia   . Hx of colonic polyps    adenomatous  . Hyperlipidemia    Lipid profile on 02/25/2011: 209, 113, 55, 132  . Nephrolithiasis   . Pancreatitis chronic   . Renal calculus   . Stroke [facility]) 3-4 yrs ago   left sided weakness  . Tobacco abuse   . Tubular adenoma 2015  . Weight loss    CT negative for occult malignancy, negative celiac, negative adrenal insufficiency    Patient Active Problem List   Diagnosis Date Noted  . Colitis 07/01/2016    . RUQ pain 10/19/2012  . Chronic nausea 05/07/2012  . Constipation 05/07/2012  . Laboratory test 02/17/2012  . Hyperlipidemia   . Chest pain 01/07/2011  . ANOREXIA 05/27/2010  . WEIGHT LOSS 05/27/2010  . COLONIC POLYPS, ADENOMATOUS, HX OF 12/20/2008  . ANXIETY DEPRESSION 12/19/2008  . Tobacco abuse 12/19/2008  . ALLERGIC RHINITIS, SEASONAL 12/19/2008  . COPD 12/19/2008  . GERD 12/19/2008  . Chronic pancreatitis ([place]) 12/19/2008  . BACK PAIN, CHRONIC 12/19/2008  . ABDOMINAL PAIN 12/19/2008  . CERVICAL CANCER, HX OF 12/19/2008  . Nephrolithiasis 12/19/2008    Past Surgical History:  Procedure Laterality Date  . ABDOMINAL HYSTERECTOMY    . APPENDECTOMY    . CATARACT EXTRACTION W/PHACO Right 05/04/2013   Procedure: CATARACT EXTRACTION PHACO AND INTRAOCULAR LENS PLACEMENT (IOC);  Surgeon: Tonny Branch, MD;  Location: [facility];  Service: Ophthalmology;  Laterality: Right;  CDE:16.17  . CATARACT EXTRACTION W/PHACO Left 05/22/2013   Procedure: CATARACT EXTRACTION PHACO AND INTRAOCULAR LENS PLACEMENT (IOC);  Surgeon: Tonny Branch, MD;  Location: [facility];  Service: Ophthalmology;  Laterality: Left;  CDE:14.75  . COLONOSCOPY N/A 02/08/2014   Dr.Rourk- normal rectum, colonic polyps bx=tubular adenoma  . COLONOSCOPY W/ POLYPECTOMY  2006   [id number], benign gastric nodule, multiple adenomatous polyps, one with tubular morphology  . ESOPHAGOGASTRODUODENOSCOPY  2006   [id number] gastric nodule  . ESOPHAGOGASTRODUODENOSCOPY N/A 02/08/2014   Dr.Rourk- abnormal stomach and gstric nodule bx= hpylori  . EUS  2010   Dr. Ardis Hughs : Multiple shadowing calcifications in pancreas, consistent with Chronic Pancreatitis.  These are mainly confined to a collection of calcifications in head/uncinate pancreas. Otherwise the pancreatic parenchyma appears fairly normal.  The main pancreatic duct, CBD are both normal without stones, dilation.    . Ileocolonoscopy  01/11/2009   RMR: Polyp at the splenic flexure,  status post hot snare removal/ Normal rectum, tubular adenoma  . PARTIAL HYSTERECTOMY      OB History    Gravida Para Term Preterm AB Living   6 3 3   3      SAB TAB Ectopic Multiple Live Births   3               Home Medications    Prior to Admission medications   Medication Sig Start Date End Date Taking? Authorizing Provider  acetaminophen (TYLENOL) 325 MG tablet Take 650 mg by mouth every 6 (six) hours as needed for mild pain.    Historical Provider, MD  ADVAIR DISKUS 250-50 MCG/DOSE AEPB Inhale 1 puff into the lungs 2 (two) times daily.  10/05/12   Historical Provider, MD  amoxicillin-clavulanate (AUGMENTIN) 875-125 MG tablet Take 1 tablet by mouth every 12 (twelve) hours. 12/15/16   Evalee Jefferson, PA-C  Calcium Carbonate (CALCIUM 600 PO) Take 1 tablet by mouth daily.      Historical Provider, MD  cefdinir (OMNICEF) 300 MG capsule Take 1 capsule (300 mg total) by mouth 2 (two) times daily. 07/24/16   Margarita Mail, PA-C  cilostazol (PLETAL) 100 MG tablet Take 1 tablet by mouth 2 (two) times daily. 03/29/15   Historical Provider, MD  ciprofloxacin (CIPRO) 500 MG tablet Take 1 tablet (500 mg total) by mouth 2 (two) times daily. Patient not taking: Reported on 11/06/2016 06/26/16   Ezequiel Essex, MD  CREON [phone] units CPEP TAKE 2 CAPSULES WITH EACH MEALS AND 1 CAPSULE WITH SNACKS. 11/30/16   Carlis Stable, NP  dronabinol (MARINOL) 2.5 MG capsule Take 2.5 mg by mouth daily.    Historical Provider, MD  DULoxetine (CYMBALTA) 60 MG capsule Take 60 mg by mouth 2 (two) times daily.     Historical Provider, MD  fentaNYL (DURAGESIC - DOSED MCG/HR) 50 MCG/HR  11/05/16   Historical Provider, MD  hydrOXYzine (ATARAX/VISTARIL) 25 MG tablet Take 1 tablet by mouth 3 (three) times daily as needed for anxiety or itching.  04/21/16   Historical Provider, MD  linaclotide (LINZESS) 72 MCG capsule Take 1 capsule (72 mcg total) by mouth daily before breakfast. 12/01/16   Carlis Stable, NP  LORazepam (ATIVAN) 1 MG  tablet Take 1 mg by mouth every 8 (eight) hours as needed for anxiety. Reported on 11/19/2015    Historical Provider, MD  metroNIDAZOLE (FLAGYL) 500 MG tablet Take 1 tablet (500 mg total) by mouth 2 (two) times daily. Patient not taking: Reported on 11/06/2016 06/26/16   Ezequiel Essex, MD  nitroGLYCERIN (NITROSTAT) 0.4 MG SL tablet Place 1 tablet (0.4 mg total) under the tongue every 5 (five) minutes as needed. 11/21/14   Herminio Commons, MD  omeprazole (PRILOSEC) 20 MG capsule TAKE 1 CAPSULE BY MOUTH ONCE DAILY. 06/10/16   Mahala Menghini, PA-C  Plecanatide (TRULANCE) 3 MG TABS Take 3 mg by mouth daily. 07/01/16   Mahala Menghini, PA-C  PROLIA 60 MG/ML SOLN injection  Inject as directed every 6 (six) months. 10/31/15   Historical Provider, MD  promethazine (PHENERGAN) 25 MG tablet Take 25 mg by mouth every 6 (six) hours as needed for nausea or vomiting.     Historical Provider, MD  RESTASIS 0.05 % ophthalmic emulsion Place 1 drop into both eyes 2 (two) times daily. 03/27/16   Historical Provider, MD    Family History Family History  Problem Relation Age of Onset  . Heart attack Father 61  . Colon cancer Maternal Grandmother     Social History Social History  Substance Use Topics  . Smoking status: Current Every Day Smoker    Packs/day: 0.50    Years: 50.00    Types: Cigarettes    Start date: 07/18/1962  . Smokeless tobacco: Never Used     Comment: 3/4 pack daily  . Alcohol use No     Comment: hx of ETOH about 20 years ago.      Allergies   Ibuprofen; Iohexol; and Strawberry (diagnostic)   Review of Systems Review of Systems  Constitutional: Negative for fever.  HENT: Positive for ear pain. Negative for congestion, dental problem, ear discharge, facial swelling, hearing loss, sore throat and tinnitus.        Edentulous, has top dentures.  Eyes: Negative.   Respiratory: Negative for chest tightness and shortness of breath.   Cardiovascular: Negative for chest pain.   Gastrointestinal: Negative for abdominal pain and nausea.  Genitourinary: Negative.   Musculoskeletal: Negative for arthralgias, joint swelling and neck pain.  Skin: Negative.  Negative for rash and wound.  Neurological: Negative for dizziness, weakness, light-headedness, numbness and headaches.  Psychiatric/Behavioral: Negative.      Physical Exam Updated Vital Signs BP 115/72 (BP Location: Right Arm)   Pulse 72   Temp 98 F (36.7 C)   Resp 20   Ht 5\' 4"  (1.626 m)   Wt 34.9 kg   SpO2 97%   BMI 13.22 kg/m   Physical Exam  Constitutional: No distress.  Extremely thin pt.  HENT:  Head: Normocephalic and atraumatic.  Right Ear: Hearing, tympanic membrane, external ear and ear canal normal.  Left Ear: Hearing, tympanic membrane, external ear and ear canal normal.  No pain with ear traction.  Non tender to palpation around ear including tragus and left TM joint.  No crepitus, FROM of mandible without discomfort.  Eyes: Conjunctivae are normal.  Neck: Normal range of motion.  Cardiovascular: Normal rate, regular rhythm, normal heart sounds and intact distal pulses.   Pulmonary/Chest: Effort normal and breath sounds normal. She has no wheezes.  Musculoskeletal: Normal range of motion.  Neurological: She is alert. No cranial nerve deficit.  Skin: Skin is warm and dry. No rash noted.  Psychiatric: She has a normal mood and affect.  Nursing note and vitals reviewed.    ED Treatments / Results  Labs (all labs ordered are listed, but only abnormal results are displayed) Labs Reviewed - No data to display  EKG  EKG Interpretation None       Radiology Ct Maxillofacial Wo Contrast  Result Date: 12/15/2016 CLINICAL DATA:  Left ear, face and orbital pain. EXAM: CT MAXILLOFACIAL WITHOUT CONTRAST TECHNIQUE: Multidetector CT imaging of the maxillofacial structures was performed. Multiplanar CT image reconstructions were also generated. A small metallic BB was placed on the  right temple in order to reliably differentiate right from left. COMPARISON:  None. FINDINGS: Osseous: Facial bones appear intact. Specifically, the mandible, maxilla, pterygoid plates, nasal septum, nasal bones,  zygomas, skullbase, and orbits appear intact. No facial bony trauma or fracture. All teeth have been extracted. Orbits: Symmetric orbits.  No proptosis or acute process. Sinuses: Chronic mucosal thickening with small air-fluid level in the right maxillary sinus suggests mild sinusitis. The paranasal sinuses are clear. Right mastoid is clear. Trace left mastoid effusion inferiorly. Soft tissues: Negative. Limited intracranial: Mild atrophy evident. IMPRESSION: No acute facial bony trauma or fracture. Mild acute on chronic right maxillary sinusitis Trace left mastoid effusion. Electronically Signed   By: Jerilynn Mages.  Shick M.D.   On: 12/15/2016 21:41    Procedures Procedures (including critical care time)  Medications Ordered in ED Medications  amoxicillin-clavulanate (AUGMENTIN) 875-125 MG per tablet 1 tablet (1 tablet Oral Given 12/15/16 2227)     Initial Impression / Assessment and Plan / ED Course  I have reviewed the triage vital signs and the nursing notes.  Pertinent labs & imaging results that were available during my care of the patient were reviewed by me and considered in my medical decision making (see chart for details).     Pt also seen by Dr. Lacinda Axon  Pt with chronic left ear pain with no findings on exam. Trace effusion left mastoid, will cover with augmentin. No rash to suggest shingles.  Pain no elicited with palpation at trigeminal nerve origin, doubt trigeminal neuralgia.  Advised f/u with pcp for further tx if sx persist.  The patient appears reasonably screened and/or stabilized for discharge and I doubt any other medical condition or other [facility] requiring further screening, evaluation, or treatment in the ED at this time prior to discharge.   Final Clinical Impressions(s) /  ED Diagnoses   Final diagnoses:  Otalgia of left ear  Mastoiditis of left side    New Prescriptions Discharge Medication List as of 12/15/2016 10:25 PM    START taking these medications   Details  amoxicillin-clavulanate (AUGMENTIN) 875-125 MG tablet Take 1 tablet by mouth every 12 (twelve) hours., Starting Tue 12/15/2016, Print         Evalee Jefferson, PA-C 12/17/16 [place], MD 12/19/16 [phone]

## 2016-12-15 NOTE — Discharge Instructions (Signed)
Follow up with your primary doctor if your symptoms persist.  Your CT is reassuring, however, there is a small amount of fluid in the mastoid sinus behind your left ear which suggests a possible infection, hence the new course of antibiotics.

## 2016-12-15 NOTE — ED Triage Notes (Signed)
Patient has had left ear pain for 5 weeks. Treated by Dr. Karie Kirks with anitbiotics. Patient states she is no better and saw Dr. Karie Kirks today who states she has a viral illness.

## 2016-12-17 ENCOUNTER — Encounter (HOSPITAL_COMMUNITY): Payer: Self-pay | Admitting: Cardiology

## 2016-12-17 ENCOUNTER — Emergency Department (HOSPITAL_COMMUNITY)
Admission: EM | Admit: 2016-12-17 | Discharge: 2016-12-17 | Disposition: A | Discharge status: Home / Self Care | Payer: Medicare Other | Attending: Emergency Medicine | Admitting: Emergency Medicine

## 2016-12-17 ENCOUNTER — Emergency Department (HOSPITAL_COMMUNITY): Payer: Medicare Other

## 2016-12-17 DIAGNOSIS — J029 Acute pharyngitis, unspecified: Secondary | ICD-10-CM | POA: Insufficient documentation

## 2016-12-17 DIAGNOSIS — R109 Unspecified abdominal pain: Secondary | ICD-10-CM | POA: Diagnosis not present

## 2016-12-17 DIAGNOSIS — F17210 Nicotine dependence, cigarettes, uncomplicated: Secondary | ICD-10-CM | POA: Diagnosis not present

## 2016-12-17 DIAGNOSIS — M545 Low back pain, unspecified: Secondary | ICD-10-CM

## 2016-12-17 DIAGNOSIS — J449 Chronic obstructive pulmonary disease, unspecified: Secondary | ICD-10-CM | POA: Insufficient documentation

## 2016-12-17 DIAGNOSIS — Z79899 Other long term (current) drug therapy: Secondary | ICD-10-CM | POA: Insufficient documentation

## 2016-12-17 DIAGNOSIS — Z8541 Personal history of malignant neoplasm of cervix uteri: Secondary | ICD-10-CM | POA: Insufficient documentation

## 2016-12-17 DIAGNOSIS — H9202 Otalgia, left ear: Secondary | ICD-10-CM | POA: Diagnosis not present

## 2016-12-17 DIAGNOSIS — G8929 Other chronic pain: Secondary | ICD-10-CM | POA: Insufficient documentation

## 2016-12-17 DIAGNOSIS — I2510 Atherosclerotic heart disease of native coronary artery without angina pectoris: Secondary | ICD-10-CM | POA: Insufficient documentation

## 2016-12-17 LAB — URINALYSIS, ROUTINE W REFLEX MICROSCOPIC
Bacteria, UA: NONE SEEN
Bilirubin Urine: NEGATIVE
GLUCOSE, UA: NEGATIVE mg/dL
Ketones, ur: NEGATIVE mg/dL
Nitrite: NEGATIVE
PH: 6 (ref 5.0–8.0)
PROTEIN: NEGATIVE mg/dL
Specific Gravity, Urine: 1.017 (ref 1.005–1.030)

## 2016-12-17 MED ORDER — ACETAMINOPHEN 500 MG PO TABS
1000.0000 mg | ORAL_TABLET | Freq: Once | ORAL | Status: AC
  Administered 2016-12-17: 1000 mg via ORAL
  Filled 2016-12-17: qty 2

## 2016-12-17 NOTE — Discharge Instructions (Signed)
Your urine tests showed a small amount of blood present. The CT scan shows no evidence of kidney stone or other abnormality. A culture of your urine has been sent to the lab for further study. Please discuss your pain management and your no patches with your primary physician in your pain management physician. Please see Dr. Orinda Kenner concerning your ongoing ear and throat area pain.

## 2016-12-17 NOTE — ED Provider Notes (Signed)
Medical screening examination/treatment/procedure(s) were conducted as a shared visit with non-physician practitioner(s) and myself.  I personally evaluated the patient during the encounter.   EKG Interpretation None       Results for orders placed or performed during the hospital encounter of 12/17/16  Urinalysis, Routine w reflex microscopic  Result Value Ref Range   Color, Urine YELLOW YELLOW   APPearance CLEAR CLEAR   Specific Gravity, Urine 1.017 1.005 - 1.030   pH 6.0 5.0 - 8.0   Glucose, UA NEGATIVE NEGATIVE mg/dL   Hgb urine dipstick MODERATE (A) NEGATIVE   Bilirubin Urine NEGATIVE NEGATIVE   Ketones, ur NEGATIVE NEGATIVE mg/dL   Protein, ur NEGATIVE NEGATIVE mg/dL   Nitrite NEGATIVE NEGATIVE   Leukocytes, UA TRACE (A) NEGATIVE   RBC / HPF 6-30 0 - 5 RBC/hpf   WBC, UA 0-5 0 - 5 WBC/hpf   Bacteria, UA NONE SEEN NONE SEEN   Mucous PRESENT    Ct Maxillofacial Wo Contrast  Result Date: 12/15/2016 CLINICAL DATA:  Left ear, face and orbital pain. EXAM: CT MAXILLOFACIAL WITHOUT CONTRAST TECHNIQUE: Multidetector CT imaging of the maxillofacial structures was performed. Multiplanar CT image reconstructions were also generated. A small metallic BB was placed on the right temple in order to reliably differentiate right from left. COMPARISON:  None. FINDINGS: Osseous: Facial bones appear intact. Specifically, the mandible, maxilla, pterygoid plates, nasal septum, nasal bones, zygomas, skullbase, and orbits appear intact. No facial bony trauma or fracture. All teeth have been extracted. Orbits: Symmetric orbits.  No proptosis or acute process. Sinuses: Chronic mucosal thickening with small air-fluid level in the right maxillary sinus suggests mild sinusitis. The paranasal sinuses are clear. Right mastoid is clear. Trace left mastoid effusion inferiorly. Soft tissues: Negative. Limited intracranial: Mild atrophy evident. IMPRESSION: No acute facial bony trauma or fracture. Mild acute on  chronic right maxillary sinusitis Trace left mastoid effusion. Electronically Signed   By: Jerilynn Mages.  Shick M.D.   On: 12/15/2016 21:41     Patient seen by me along with the physician assistant. Patient was seen in the emergency department on March 20. Patient's had this complaint of bilateral ear pain and sore throat. Had CT scan done at that time without any significant findings other than some acute on chronic right maxillary  Right sinusitis. And a trace left mastoid effusion.  Patient back base of the same complaints. Which is also complaining of some right flank pain and right low back pain. Urinalysis with a little bit of blood in it. We sent for culture. Based on this patient had CT renal study which is still pending.  Patient nontoxic no acute distress oropharynx is clear no erythema no exudate TMs are normal bilaterally no evidence of an otitis media. Abdomen soft and nontender.  Would refer her to ear nose and throat have her continue the antibiotic Augmentin that she is already on.  Based on the CT renal study which results are still pending Meryl Crutch will treat appropriately for that.  As mentioned will get a urine culture but will not initiate any new antibiotics on a CT renal study shows something different.   Fredia Sorrow, MD 12/17/16 2032

## 2016-12-17 NOTE — ED Triage Notes (Addendum)
Left earache and sore throat times 1 1/2  Months.  Seen here 2 days ago with same .  Also c/o right  flank and right lower back pain .  c/o difficulty urinating. Marland Kitchen

## 2016-12-17 NOTE — ED Provider Notes (Signed)
[facility] DEPT Provider Note   CSN: [id number] Arrival date & time: 12/17/16  [name]     History   Chief Complaint Chief Complaint  Patient presents with  . Otalgia  . Flank Pain    HPI [name] is a 71 y.o. female.  Patient is a 71 year old female who presents to the emergency department with a complaint of left ear pain, sore throat, right flank pain.  The patient states that she's been having problems with facial pain, sinus pain, ear pain, and sore throat issues for one and a half months. She was seen in the emergency department on March 20 at which time a CT scan suggested some chronic sinusitis and possibly some mastoiditis on. The patient was treated with antibiotic therapy. The patient states she's been on antibiotic for couple days she still having pain. She also complains of pain in the right flank radiating to her back. She has a history of chronic back related problems. She has not noted any blood in the urine, she's not noted any blood in the stool, and she's not had any injury or trauma to the right side. There's been no fever or chills reported. She presents now for evaluation concerning this issue.      Past Medical History:  Diagnosis Date  . Allergic rhinitis   . Anxiety   . Anxiety and depression   . Back pain, chronic   . CAD (coronary artery disease)    palpitations, dizziness, chest pain  . Cervical cancer ([place])   . Chronic abdominal pain   . COPD (chronic obstructive pulmonary disease) ([place])   . Depression   . Family history of anesthesia complication   . GERD (gastroesophageal reflux disease)   . Helicobacter pylori infection 2015   treated with prevpac  . Hematochezia   . Hx of colonic polyps    adenomatous  . Hyperlipidemia    Lipid profile on 02/25/2011: 209, 113, 55, 132  . Nephrolithiasis   . Pancreatitis chronic   . Renal calculus   . Stroke [facility]) 3-4 yrs ago   left sided weakness  . Tobacco abuse   . Tubular adenoma 2015    . Weight loss    CT negative for occult malignancy, negative celiac, negative adrenal insufficiency    Patient Active Problem List   Diagnosis Date Noted  . Colitis 07/01/2016  . RUQ pain 10/19/2012  . Chronic nausea 05/07/2012  . Constipation 05/07/2012  . Laboratory test 02/17/2012  . Hyperlipidemia   . Chest pain 01/07/2011  . ANOREXIA 05/27/2010  . WEIGHT LOSS 05/27/2010  . COLONIC POLYPS, ADENOMATOUS, HX OF 12/20/2008  . ANXIETY DEPRESSION 12/19/2008  . Tobacco abuse 12/19/2008  . ALLERGIC RHINITIS, SEASONAL 12/19/2008  . COPD 12/19/2008  . GERD 12/19/2008  . Chronic pancreatitis ([place]) 12/19/2008  . BACK PAIN, CHRONIC 12/19/2008  . ABDOMINAL PAIN 12/19/2008  . CERVICAL CANCER, HX OF 12/19/2008  . Nephrolithiasis 12/19/2008    Past Surgical History:  Procedure Laterality Date  . ABDOMINAL HYSTERECTOMY    . APPENDECTOMY    . CATARACT EXTRACTION W/PHACO Right 05/04/2013   Procedure: CATARACT EXTRACTION PHACO AND INTRAOCULAR LENS PLACEMENT (IOC);  Surgeon: Tonny Branch, MD;  Location: [facility];  Service: Ophthalmology;  Laterality: Right;  CDE:16.17  . CATARACT EXTRACTION W/PHACO Left 05/22/2013   Procedure: CATARACT EXTRACTION PHACO AND INTRAOCULAR LENS PLACEMENT (IOC);  Surgeon: Tonny Branch, MD;  Location: [facility];  Service: Ophthalmology;  Laterality: Left;  CDE:14.75  . COLONOSCOPY N/A 02/08/2014  Dr.Rourk- normal rectum, colonic polyps bx=tubular adenoma  . COLONOSCOPY W/ POLYPECTOMY  2006   DPO:EUMPNTIRWER, benign gastric nodule, multiple adenomatous polyps, one with tubular morphology  . ESOPHAGOGASTRODUODENOSCOPY  2006   XVQ:MGQQPY gastric nodule  . ESOPHAGOGASTRODUODENOSCOPY N/A 02/08/2014   Dr.Rourk- abnormal stomach and gstric nodule bx= hpylori  . EUS  2010   Dr. Ardis Hughs : Multiple shadowing calcifications in pancreas, consistent with Chronic Pancreatitis.  These are mainly confined to a collection of calcifications in head/uncinate pancreas. Otherwise the pancreatic  parenchyma appears fairly normal.  The main pancreatic duct, CBD are both normal without stones, dilation.    . Ileocolonoscopy  01/11/2009   RMR: Polyp at the splenic flexure, status post hot snare removal/ Normal rectum, tubular adenoma  . PARTIAL HYSTERECTOMY      OB History    Gravida Para Term Preterm AB Living   6 3 3   3      SAB TAB Ectopic Multiple Live Births   3               Home Medications    Prior to Admission medications   Medication Sig Start Date End Date Taking? Authorizing Provider  acetaminophen (TYLENOL) 325 MG tablet Take 650 mg by mouth every 6 (six) hours as needed for mild pain.    Historical Provider, MD  ADVAIR DISKUS 250-50 MCG/DOSE AEPB Inhale 1 puff into the lungs 2 (two) times daily.  10/05/12   Historical Provider, MD  amoxicillin-clavulanate (AUGMENTIN) 875-125 MG tablet Take 1 tablet by mouth every 12 (twelve) hours. 12/15/16   Evalee Jefferson, PA-C  Calcium Carbonate (CALCIUM 600 PO) Take 1 tablet by mouth daily.      Historical Provider, MD  cefdinir (OMNICEF) 300 MG capsule Take 1 capsule (300 mg total) by mouth 2 (two) times daily. 07/24/16   Margarita Mail, PA-C  cilostazol (PLETAL) 100 MG tablet Take 1 tablet by mouth 2 (two) times daily. 03/29/15   Historical Provider, MD  ciprofloxacin (CIPRO) 500 MG tablet Take 1 tablet (500 mg total) by mouth 2 (two) times daily. Patient not taking: Reported on 11/06/2016 06/26/16   Ezequiel Essex, MD  CREON [phone] units CPEP TAKE 2 CAPSULES WITH EACH MEALS AND 1 CAPSULE WITH SNACKS. 11/30/16   Carlis Stable, NP  dronabinol (MARINOL) 2.5 MG capsule Take 2.5 mg by mouth daily.    Historical Provider, MD  DULoxetine (CYMBALTA) 60 MG capsule Take 60 mg by mouth 2 (two) times daily.     Historical Provider, MD  fentaNYL (DURAGESIC - DOSED MCG/HR) 50 MCG/HR  11/05/16   Historical Provider, MD  hydrOXYzine (ATARAX/VISTARIL) 25 MG tablet Take 1 tablet by mouth 3 (three) times daily as needed for anxiety or itching.  04/21/16    Historical Provider, MD  linaclotide (LINZESS) 72 MCG capsule Take 1 capsule (72 mcg total) by mouth daily before breakfast. 12/01/16   Carlis Stable, NP  LORazepam (ATIVAN) 1 MG tablet Take 1 mg by mouth every 8 (eight) hours as needed for anxiety. Reported on 11/19/2015    Historical Provider, MD  metroNIDAZOLE (FLAGYL) 500 MG tablet Take 1 tablet (500 mg total) by mouth 2 (two) times daily. Patient not taking: Reported on 11/06/2016 06/26/16   Ezequiel Essex, MD  nitroGLYCERIN (NITROSTAT) 0.4 MG SL tablet Place 1 tablet (0.4 mg total) under the tongue every 5 (five) minutes as needed. 11/21/14   Herminio Commons, MD  omeprazole (PRILOSEC) 20 MG capsule TAKE 1 CAPSULE BY MOUTH ONCE DAILY. 06/10/16  Mahala Menghini, PA-C  Plecanatide (TRULANCE) 3 MG TABS Take 3 mg by mouth daily. 07/01/16   Mahala Menghini, PA-C  PROLIA 60 MG/ML SOLN injection Inject as directed every 6 (six) months. 10/31/15   Historical Provider, MD  promethazine (PHENERGAN) 25 MG tablet Take 25 mg by mouth every 6 (six) hours as needed for nausea or vomiting.     Historical Provider, MD  RESTASIS 0.05 % ophthalmic emulsion Place 1 drop into both eyes 2 (two) times daily. 03/27/16   Historical Provider, MD    Family History Family History  Problem Relation Age of Onset  . Heart attack Father 35  . Colon cancer Maternal Grandmother     Social History Social History  Substance Use Topics  . Smoking status: Current Every Day Smoker    Packs/day: 0.50    Years: 50.00    Types: Cigarettes    Start date: 07/18/1962  . Smokeless tobacco: Never Used     Comment: 3/4 pack daily  . Alcohol use No     Comment: hx of ETOH about 20 years ago.      Allergies   Ibuprofen; Iohexol; and Strawberry (diagnostic)   Review of Systems Review of Systems  HENT: Positive for congestion, ear pain and sore throat. Negative for facial swelling.   Genitourinary: Positive for flank pain. Negative for hematuria, vaginal bleeding, vaginal  discharge and vaginal pain.  Musculoskeletal: Positive for arthralgias and back pain.  Psychiatric/Behavioral: The patient is nervous/anxious.   All other systems reviewed and are negative.    Physical Exam Updated Vital Signs BP 115/87   Pulse 87   Temp 97.9 F (36.6 C) (Oral)   Resp 16   Ht 5\' 4"  (1.626 m)   Wt 34.9 kg   SpO2 99%   BMI 13.22 kg/m   Physical Exam  Constitutional: She is oriented to person, place, and time. She appears well-developed and well-nourished.  Non-toxic appearance.  HENT:  Head: Normocephalic.  Right Ear: Tympanic membrane and external ear normal.  Left Ear: Tympanic membrane and external ear normal.  Mild nasal congestion present.  The oropharynx is clear. The patient is edentulous. The airway is patent.  There is mild soreness of the left mastoid area, but no redness or swelling noted.  Eyes: EOM and lids are normal. Pupils are equal, round, and reactive to light.  Neck: Normal range of motion. Neck supple. Carotid bruit is not present.  Cardiovascular: Normal rate, regular rhythm, normal heart sounds, intact distal pulses and normal pulses.   Pulmonary/Chest: Breath sounds normal. No respiratory distress.  Abdominal: Soft. Bowel sounds are normal. There is no tenderness. There is no guarding.  Musculoskeletal: Normal range of motion.  Right CVA tenderness. Pain of the lumbar area to palpation and certain movement. No hot areas along the lumbar area.  Lymphadenopathy:       Head (right side): No submandibular adenopathy present.       Head (left side): No submandibular adenopathy present.    She has no cervical adenopathy.  Neurological: She is alert and oriented to person, place, and time. She has normal strength. No cranial nerve deficit or sensory deficit.  Skin: Skin is warm and dry.  Psychiatric: She has a normal mood and affect. Her speech is normal.  Nursing note and vitals reviewed.    ED Treatments / Results  Labs (all labs  ordered are listed, but only abnormal results are displayed) Labs Reviewed  URINALYSIS, ROUTINE W REFLEX MICROSCOPIC -  Abnormal; Notable for the following:       Result Value   Hgb urine dipstick MODERATE (*)    Leukocytes, UA TRACE (*)    All other components within normal limits    EKG  EKG Interpretation None       Radiology Ct Maxillofacial Wo Contrast  Result Date: 12/15/2016 CLINICAL DATA:  Left ear, face and orbital pain. EXAM: CT MAXILLOFACIAL WITHOUT CONTRAST TECHNIQUE: Multidetector CT imaging of the maxillofacial structures was performed. Multiplanar CT image reconstructions were also generated. A small metallic BB was placed on the right temple in order to reliably differentiate right from left. COMPARISON:  None. FINDINGS: Osseous: Facial bones appear intact. Specifically, the mandible, maxilla, pterygoid plates, nasal septum, nasal bones, zygomas, skullbase, and orbits appear intact. No facial bony trauma or fracture. All teeth have been extracted. Orbits: Symmetric orbits.  No proptosis or acute process. Sinuses: Chronic mucosal thickening with small air-fluid level in the right maxillary sinus suggests mild sinusitis. The paranasal sinuses are clear. Right mastoid is clear. Trace left mastoid effusion inferiorly. Soft tissues: Negative. Limited intracranial: Mild atrophy evident. IMPRESSION: No acute facial bony trauma or fracture. Mild acute on chronic right maxillary sinusitis Trace left mastoid effusion. Electronically Signed   By: Jerilynn Mages.  Shick M.D.   On: 12/15/2016 21:41    Procedures Procedures (including critical care time)  Medications Ordered in ED Medications - No data to display   Initial Impression / Assessment and Plan / ED Course  I have reviewed the triage vital signs and the nursing notes.  Pertinent labs & imaging results that were available during my care of the patient were reviewed by me and considered in my medical decision making (see chart for  details).     *I have reviewed nursing notes, vital signs, and all appropriate lab and imaging results for this patient.**  Final Clinical Impressions(s) / ED Diagnoses MDM Vital signs within normal limits. Pulse oximetry is 99% on room air. Patient has no problem with swallowing or speaking. She continues to complain of left earache and throat pain. No new findings on examination at this time. I reviewed the CT scan from 12/15/2016, there were no emergent findings at that time. There was noted some right sinusitis and question of some mastoid effusion present.  Patient seen with me by Dr. Rogene Houston.  Urinalysis shows a clear yellow specimen with a specific gravity 1.017. There is moderate blood, trace leukocyte esterase, and 6-30 red blood cells. The patient is already on Augmentin, we'll not add additional antibiotics at this time. I will send the urine to the lab for culture. Patient will have a CT stone study is that she has a history of kidney stones.  CT scan is negative for kidney stone or other acute abnormality.  The patient is advised to finish her Augmentin. A culture of the urine has been sent to the lab.  The patient now tells me that she has been on fentanyl patches. Her physicians are decreasing her fentanyl in an attempt to wean her off of the fentanyl, and she feels that that is probably why she's been having some much pain. I've encouraged patient to discuss this with her primary physician in her pain management physician. I reassured her that her vital signs are good and that her lab test are nonacute at this time.    Final diagnoses:  None    New Prescriptions New Prescriptions   No medications on file     Lily Kocher,  PA-C 12/17/16 2110    Fredia Sorrow, MD 12/18/16 [id number]

## 2016-12-19 LAB — URINE CULTURE: Culture: NO GROWTH

## 2016-12-21 ENCOUNTER — Ambulatory Visit (INDEPENDENT_AMBULATORY_CARE_PROVIDER_SITE_OTHER): Payer: Medicare Other | Admitting: Otolaryngology

## 2016-12-21 DIAGNOSIS — H9209 Otalgia, unspecified ear: Secondary | ICD-10-CM | POA: Diagnosis not present

## 2016-12-21 DIAGNOSIS — R070 Pain in throat: Secondary | ICD-10-CM | POA: Diagnosis not present

## 2016-12-21 DIAGNOSIS — R490 Dysphonia: Secondary | ICD-10-CM | POA: Diagnosis not present

## 2016-12-21 DIAGNOSIS — F17210 Nicotine dependence, cigarettes, uncomplicated: Secondary | ICD-10-CM

## 2017-01-06 ENCOUNTER — Encounter: Payer: Self-pay | Admitting: Nurse Practitioner

## 2017-01-06 ENCOUNTER — Ambulatory Visit (INDEPENDENT_AMBULATORY_CARE_PROVIDER_SITE_OTHER): Payer: Medicare Other | Admitting: Nurse Practitioner

## 2017-01-06 ENCOUNTER — Other Ambulatory Visit: Payer: Self-pay

## 2017-01-06 VITALS — BP 117/70 | HR 83 | Temp 98.0°F | Ht 64.0 in | Wt 75.8 lb

## 2017-01-06 DIAGNOSIS — R634 Abnormal weight loss: Secondary | ICD-10-CM | POA: Diagnosis not present

## 2017-01-06 DIAGNOSIS — R110 Nausea: Secondary | ICD-10-CM

## 2017-01-06 DIAGNOSIS — R109 Unspecified abdominal pain: Secondary | ICD-10-CM

## 2017-01-06 DIAGNOSIS — K5903 Drug induced constipation: Secondary | ICD-10-CM | POA: Diagnosis not present

## 2017-01-06 DIAGNOSIS — K861 Other chronic pancreatitis: Secondary | ICD-10-CM | POA: Diagnosis not present

## 2017-01-06 DIAGNOSIS — R1011 Right upper quadrant pain: Secondary | ICD-10-CM

## 2017-01-06 MED ORDER — NA SULFATE-K SULFATE-MG SULF 17.5-3.13-1.6 GM/177ML PO SOLN: 1.0000 | ORAL | 0 refills | Status: DC

## 2017-01-06 NOTE — Assessment & Plan Note (Signed)
Persistent weight loss is concerning. Has had in another few pound weight loss. Her weight is now only 75.8 pounds. She is due for colonoscopy in about a year and a half. Given her weight loss as well as persistent abdominal pain and constipation I will plan for early interval colonoscopy with possible upper endoscopy to further evaluate for any concerning etiology. Return for follow-up in 2 months.  Proceed with TCS +/- EGD with Dr. Gala Romney in near future: the risks, benefits, and alternatives have been discussed with the patient in detail. The patient states understanding and desires to proceed.  The patient is currently on Marinol, Cymbalta, fentanyl Duragesic patch, Ativan, Phenergan. No other anticoagulants, anxiolytics, chronic pain medications, or antidepressants. We'll plan for the procedure and propofol/MAC to promote adequate sedation.

## 2017-01-06 NOTE — Patient Instructions (Addendum)
EG said that patient needed to be done in the OR. I have changed her appointment date and time. She is aware of new date and time. New instructions are in the mail. Hoyle Sauer is also aware and has moved her.   NO PA needed for HIDA or TCS+/-EGD

## 2017-01-06 NOTE — Assessment & Plan Note (Signed)
Initially complaint of periumbilical to epigastric pain. However, on exam her pain seems to be more right upper quadrant. It is associated with nausea. Previous ultrasound did not demonstrate gallstones. Previous recommendation for HIDA scan evaluation in 2015 does not appear to been completed. She did have a remote HIDA scan which was normal. At this point I will proceed with another nuclear medicine HIDA scan to evaluate for gallbladder function. Return for follow-up in 2 months.

## 2017-01-06 NOTE — Assessment & Plan Note (Signed)
Chronic nausea which is ongoing and associated with right upper quadrant pain. Previously recommended HIDA scan workup in 2015 does not appear to been completed. She does have tenderness to palpation right upper quadrant on exam today, as noted per below. Further workup as per below. Return for follow-up in 2 months.

## 2017-01-06 NOTE — Assessment & Plan Note (Signed)
Patient with chronic pancreatitis and chronic abdominal pain. Having periumbilical to epigastric pain which seems more RUQ on exam. Symptoms are chronic and ongoing. Further evaluation and treatment of constipation and workup for gallbladder etiology. Return in 2 months and if workup negative can consider referral to [facility] for chronic abdominal pain/chronic pancreatitis.

## 2017-01-06 NOTE — Patient Instructions (Signed)
1. We will schedule a test to evaluate the function of your gallbladder. 2. We will schedule your colonoscopy with possible upper endoscopy for you 3. Return for follow-up in 2 months to check on your symptoms 4. Keep taking Linzess 5. Call if any questions or problems.

## 2017-01-06 NOTE — Progress Notes (Signed)
Referring Provider: Lemmie Evens, MD Primary Care Physician:  Robert Bellow, MD Primary GI:  Dr. Gala Romney  Chief Complaint  Patient presents with  . Abdominal Pain    can't eat  . Weight Loss    HPI:   [name] is a 71 y.o. female who presents for abdominal pain and weight loss. The patient was last seen in our office 11/06/2016 for GERD, constipation, and idiopathic chronic pancreatitis. It was noted at that time that her primary care was reducing opioids and patient was having more musculoskeletal pain. Was going one week between bowel movements followed by multiple bowel movements over 2 days and then a repeat of this cycle. Wait down 4 pounds since previous 4 months. 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 Previous seen in the emergency department for abdominal pain felt to be colitis and treated with antibiotics with resolution. Due for surveillance colonoscopy in 2020 with a history of adenoma.  Recommended trial of Linzess 72 g daily for 2 weeks, continue PPI, follow up in 2 weeks via telephone. Weight loss deemed concerning. Phone note indicates recent failure to thrive, sooner follow-up to assess etiology of her pain and if there is a significant pancreatic component should be referred to [facility] for consideration celiac plexus ablation and then focused on residual pain between PCP, GI, pain management. The patient was a no-show for her follow-up visit on 12/08/2016.  Today she states she is feeling rough. Constipation resolved at this point with Linzess. She is happy with this. Subjectively lost 3 pounds this week. Objectively she is down about 2 lbs since 11/06/16 (2 months). She is having abdominal pain and back pain "right now I'm hurting all over because they're weaning me down off patches and not starting anythign else. I've been on Fentanyl for the past 15 years." Abdominal pain is periumbilical to  epigastric, constant with intermittent worsening, described as 'dull" when not as bad, when worsening happens "its just like a regular pain" and unable to describe. Appetite is poor due to pain. Denies GERD/dyspepsia symptoms. Her abdominal pain is chronic "had it the past 24 years." Denies hematochezia, melena. Has nausea and vomiting (last episode of vomiting was about 2 days ago) and manages with Phenergan. Denies any other upper or lower GI symptoms.  Past Medical History:  Diagnosis Date  . Allergic rhinitis   . Anxiety   . Anxiety and depression   . Back pain, chronic   . CAD (coronary artery disease)    palpitations, dizziness, chest pain  . Cervical cancer ([place])   . Chronic abdominal pain   . COPD (chronic obstructive pulmonary disease) ([place])   . Depression   . Family history of anesthesia complication   . GERD (gastroesophageal reflux disease)   . Helicobacter pylori infection 2015   treated with prevpac  . Hematochezia   . Hx of colonic polyps    adenomatous  . Hyperlipidemia    Lipid profile on 02/25/2011: 209, 113, 55, 132  . Nephrolithiasis   . Pancreatitis chronic   . Renal calculus   . Stroke [facility]) 3-4 yrs ago   left sided weakness  . Tobacco abuse   . Tubular adenoma 2015  . Weight loss    CT negative for occult malignancy, negative celiac, negative adrenal insufficiency    Past Surgical History:  Procedure Laterality Date  . ABDOMINAL HYSTERECTOMY    . APPENDECTOMY    . CATARACT EXTRACTION  W/PHACO Right 05/04/2013   Procedure: CATARACT EXTRACTION PHACO AND INTRAOCULAR LENS PLACEMENT (IOC);  Surgeon: Tonny Branch, MD;  Location: [facility];  Service: Ophthalmology;  Laterality: Right;  CDE:16.17  . CATARACT EXTRACTION W/PHACO Left 05/22/2013   Procedure: CATARACT EXTRACTION PHACO AND INTRAOCULAR LENS PLACEMENT (IOC);  Surgeon: Tonny Branch, MD;  Location: [facility];  Service: Ophthalmology;  Laterality: Left;  CDE:14.75  . COLONOSCOPY N/A 02/08/2014   Dr.Rourk- normal  rectum, colonic polyps bx=tubular adenoma  . COLONOSCOPY W/ POLYPECTOMY  2006   [id number]:[id number], benign gastric nodule, multiple adenomatous polyps, one with tubular morphology  . ESOPHAGOGASTRODUODENOSCOPY  2006   ZES:[id number] gastric nodule  . ESOPHAGOGASTRODUODENOSCOPY N/A 02/08/2014   Dr.Rourk- abnormal stomach and gstric nodule bx= hpylori  . EUS  2010   Dr. Ardis Hughs : Multiple shadowing calcifications in pancreas, consistent with Chronic Pancreatitis.  These are mainly confined to a collection of calcifications in head/uncinate pancreas. Otherwise the pancreatic parenchyma appears fairly normal.  The main pancreatic duct, CBD are both normal without stones, dilation.    . Ileocolonoscopy  01/11/2009   RMR: Polyp at the splenic flexure, status post hot snare removal/ Normal rectum, tubular adenoma  . PARTIAL HYSTERECTOMY      Current Outpatient Prescriptions  Medication Sig Dispense Refill  . acetaminophen (TYLENOL) 325 MG tablet Take 650 mg by mouth every 6 (six) hours as needed for mild pain.    Marland Kitchen ADVAIR DISKUS 250-50 MCG/DOSE AEPB Inhale 1 puff into the lungs 2 (two) times daily.     . Calcium Carbonate (CALCIUM 600 PO) Take 1 tablet by mouth daily.      . cilostazol (PLETAL) 100 MG tablet Take 1 tablet by mouth 2 (two) times daily.    Marland Kitchen CREON 24000-76000 units CPEP TAKE 2 CAPSULES WITH EACH MEALS AND 1 CAPSULE WITH SNACKS. 240 capsule 5  . dronabinol (MARINOL) 2.5 MG capsule Take 2.5 mg by mouth daily.    . DULoxetine (CYMBALTA) 60 MG capsule Take 60 mg by mouth 2 (two) times daily.     . fentaNYL (DURAGESIC - DOSED MCG/HR) 50 MCG/HR Place 37.5 mcg onto the skin every 3 (three) days.     . hydrOXYzine (ATARAX/VISTARIL) 25 MG tablet Take 1 tablet by mouth 3 (three) times daily as needed for anxiety or itching.     . linaclotide (LINZESS) 72 MCG capsule Take 1 capsule (72 mcg total) by mouth daily before breakfast. 30 capsule 5  . LORazepam (ATIVAN) 1 MG tablet Take 1 mg by mouth every 8  (eight) hours as needed for anxiety. Reported on 11/19/2015    . nitroGLYCERIN (NITROSTAT) 0.4 MG SL tablet Place 1 tablet (0.4 mg total) under the tongue every 5 (five) minutes as needed. 25 tablet 3  . omeprazole (PRILOSEC) 20 MG capsule TAKE 1 CAPSULE BY MOUTH ONCE DAILY. 30 capsule 11  . promethazine (PHENERGAN) 25 MG tablet Take 25 mg by mouth every 6 (six) hours as needed for nausea or vomiting.     . RESTASIS 0.05 % ophthalmic emulsion Place 1 drop into both eyes 2 (two) times daily.     No current facility-administered medications for this visit.     Allergies as of 01/06/2017 - Review Complete 01/06/2017  Allergen Reaction Noted  . Ibuprofen Anaphylaxis and Hives 12/19/2008  . Iohexol Hives and Swelling 05/23/2004  . Strawberry (diagnostic) Itching 11/09/2015    Family History  Problem Relation Age of Onset  . Heart attack Father 10  . Colon cancer Maternal Grandmother  Social History   Social History  . Marital status: Divorced    Spouse name: N/A  . Number of children: 3  . Years of education: N/A   Occupational History  . disabled Unemployed   Social History Main Topics  . Smoking status: Current Every Day Smoker    Packs/day: 0.50    Years: 50.00    Types: Cigarettes    Start date: 07/18/1962  . Smokeless tobacco: Never Used     Comment: 3/4 pack daily  . Alcohol use No     Comment: hx of ETOH about 20 years ago.   . Drug use: No  . Sexual activity: No   Other Topics Concern  . None   Social History Narrative  . None    Review of Systems: General: Negative for anorexia, weight loss, fever, chills, fatigue, weakness. ENT: Negative for hoarseness, difficulty swallowing. CV: Negative for chest pain, angina, palpitations, peripheral edema.  Respiratory: Negative for dyspnea at rest, cough, sputum, wheezing.  GI: See history of present illness. MS: Admits chronic pain..  Derm: Negative for rash or itching.  Endo: Negative for unusual weight  change.  Heme: Negative for bruising or bleeding. Allergy: Negative for rash or hives.   Physical Exam: BP 117/70   Pulse 83   Temp 98 F (36.7 C) (Oral)   Ht 5\' 4"  (1.626 m)   Wt 75 lb 12.8 oz (34.4 kg)   BMI 13.01 kg/m  General:   Alert and oriented. Pleasant and cooperative. Well-nourished and well-developed. Very thin female. Eyes:  Without icterus, sclera clear and conjunctiva pink.  Ears:  Normal auditory acuity. Cardiovascular:  S1, S2 present without murmurs appreciated. Extremities without clubbing or edema. Respiratory:  Clear to auscultation bilaterally. No wheezes, rales, or rhonchi. No distress.  Gastrointestinal:  +BS, soft, and non-distended. TTP primarily RUQ. No HSM noted. No guarding or rebound. No masses appreciated.  Rectal:  Deferred  Musculoskalatal:  Symmetrical without gross deformities. Skin:  Intact without significant lesions or rashes. Neurologic:  Alert and oriented x4;  grossly normal neurologically. Psych:  Alert and cooperative. Normal mood and affect. Heme/Lymph/Immune: No excessive bruising noted.    01/06/2017 11:35 AM   Disclaimer: This note was dictated with voice recognition software. Similar sounding words can inadvertently be transcribed and may not be corrected upon review.

## 2017-01-06 NOTE — Assessment & Plan Note (Signed)
Symptoms significantly improved/essentially resolved on linzess 72 mcg. Continue Linzess, followup as needed.

## 2017-01-07 NOTE — Progress Notes (Signed)
CC'D TO PCP °

## 2017-01-07 NOTE — Progress Notes (Signed)
Noted  

## 2017-01-13 ENCOUNTER — Encounter (HOSPITAL_COMMUNITY): Payer: Self-pay

## 2017-01-13 ENCOUNTER — Encounter (HOSPITAL_COMMUNITY)
Admission: RE | Admit: 2017-01-13 | Discharge: 2017-01-13 | Disposition: A | Discharge status: Home / Self Care | Payer: Medicare Other | Source: Ambulatory Visit | Attending: Nurse Practitioner | Admitting: Nurse Practitioner

## 2017-01-13 DIAGNOSIS — R1011 Right upper quadrant pain: Secondary | ICD-10-CM | POA: Diagnosis not present

## 2017-01-13 DIAGNOSIS — K5903 Drug induced constipation: Secondary | ICD-10-CM | POA: Diagnosis not present

## 2017-01-13 DIAGNOSIS — R634 Abnormal weight loss: Secondary | ICD-10-CM | POA: Diagnosis present

## 2017-01-13 DIAGNOSIS — R110 Nausea: Secondary | ICD-10-CM | POA: Diagnosis not present

## 2017-01-13 DIAGNOSIS — K861 Other chronic pancreatitis: Secondary | ICD-10-CM | POA: Diagnosis present

## 2017-01-13 MED ORDER — TECHNETIUM TC 99M MEBROFENIN IV KIT
5.0000 | PACK | Freq: Once | INTRAVENOUS | Status: AC | PRN
  Administered 2017-01-13: 5.4 via INTRAVENOUS

## 2017-01-21 ENCOUNTER — Encounter (HOSPITAL_COMMUNITY): Payer: Self-pay | Admitting: Emergency Medicine

## 2017-01-21 ENCOUNTER — Emergency Department (HOSPITAL_COMMUNITY)
Admission: EM | Admit: 2017-01-21 | Discharge: 2017-01-22 | Disposition: A | Discharge status: Home / Self Care | Payer: Medicare Other | Attending: Emergency Medicine | Admitting: Emergency Medicine

## 2017-01-21 DIAGNOSIS — N907 Vulvar cyst: Secondary | ICD-10-CM | POA: Diagnosis not present

## 2017-01-21 DIAGNOSIS — F17210 Nicotine dependence, cigarettes, uncomplicated: Secondary | ICD-10-CM | POA: Insufficient documentation

## 2017-01-21 DIAGNOSIS — L089 Local infection of the skin and subcutaneous tissue, unspecified: Secondary | ICD-10-CM

## 2017-01-21 DIAGNOSIS — I2510 Atherosclerotic heart disease of native coronary artery without angina pectoris: Secondary | ICD-10-CM | POA: Diagnosis not present

## 2017-01-21 DIAGNOSIS — Z79899 Other long term (current) drug therapy: Secondary | ICD-10-CM | POA: Insufficient documentation

## 2017-01-21 DIAGNOSIS — J449 Chronic obstructive pulmonary disease, unspecified: Secondary | ICD-10-CM | POA: Insufficient documentation

## 2017-01-21 DIAGNOSIS — Z8541 Personal history of malignant neoplasm of cervix uteri: Secondary | ICD-10-CM | POA: Insufficient documentation

## 2017-01-21 DIAGNOSIS — L729 Follicular cyst of the skin and subcutaneous tissue, unspecified: Secondary | ICD-10-CM

## 2017-01-21 NOTE — ED Triage Notes (Signed)
Patient complaining of abscess to groin area "for 5 years but I noticed yesterday it's getting infected."

## 2017-01-22 DIAGNOSIS — N907 Vulvar cyst: Secondary | ICD-10-CM | POA: Diagnosis not present

## 2017-01-22 MED ORDER — DOXYCYCLINE HYCLATE 100 MG PO TABS
100.0000 mg | ORAL_TABLET | Freq: Once | ORAL | Status: AC
  Administered 2017-01-22: 100 mg via ORAL
  Filled 2017-01-22: qty 1

## 2017-01-22 MED ORDER — ACETAMINOPHEN 500 MG PO TABS
1000.0000 mg | ORAL_TABLET | Freq: Once | ORAL | Status: AC
  Administered 2017-01-22: 1000 mg via ORAL
  Filled 2017-01-22: qty 2

## 2017-01-22 MED ORDER — DOXYCYCLINE HYCLATE 100 MG PO CAPS: 100.0000 mg | ORAL_CAPSULE | Freq: Two times a day (BID) | ORAL | 0 refills | Status: DC

## 2017-01-22 NOTE — Discharge Instructions (Signed)
Please soak the cyst are in warm Epsom Salt soak for 10-15 min daily. Use doxycycline 2 times daily with food. See your MD for recheck of the cyst in 5 to 7 days.

## 2017-01-22 NOTE — ED Notes (Signed)
Pt ambulatory to waiting room. Pt verbalized understanding of discharge instructions.   

## 2017-01-22 NOTE — ED Provider Notes (Signed)
[facility] DEPT Provider Note   CSN: [id number] Arrival date & time: 01/21/17  2040     History   Chief Complaint Chief Complaint  Patient presents with  . Abscess    HPI [name] is a 71 y.o. female.  Pt states she has had a cyst on the vaginal area for 5 years. She has been seen by her MD of multiple occasions, and told not to bother it, and there were no problems with the cyst. Pt states now she notices the area is more painful and she feels it swells at times.  No fever or chills. Pt want the area rechecked for infection.   The history is provided by the patient.  Abscess  Associated symptoms: fever     Past Medical History:  Diagnosis Date  . Allergic rhinitis   . Anxiety   . Anxiety and depression   . Back pain, chronic   . CAD (coronary artery disease)    palpitations, dizziness, chest pain  . Cervical cancer ([place])   . Chronic abdominal pain   . COPD (chronic obstructive pulmonary disease) ([place])   . Depression   . Family history of anesthesia complication   . GERD (gastroesophageal reflux disease)   . Helicobacter pylori infection 2015   treated with prevpac  . Hematochezia   . Hx of colonic polyps    adenomatous  . Hyperlipidemia    Lipid profile on 02/25/2011: 209, 113, 55, 132  . Nephrolithiasis   . Pancreatitis chronic   . Renal calculus   . Stroke [facility]) 3-4 yrs ago   left sided weakness  . Tobacco abuse   . Tubular adenoma 2015  . Weight loss    CT negative for occult malignancy, negative celiac, negative adrenal insufficiency    Patient Active Problem List   Diagnosis Date Noted  . Colitis 07/01/2016  . RUQ pain 10/19/2012  . Chronic nausea 05/07/2012  . Constipation 05/07/2012  . Laboratory test 02/17/2012  . Hyperlipidemia   . Chest pain 01/07/2011  . ANOREXIA 05/27/2010  . WEIGHT LOSS 05/27/2010  . COLONIC POLYPS, ADENOMATOUS, HX OF 12/20/2008  . ANXIETY DEPRESSION 12/19/2008  . Tobacco abuse 12/19/2008  . ALLERGIC  RHINITIS, SEASONAL 12/19/2008  . COPD 12/19/2008  . GERD 12/19/2008  . Chronic pancreatitis ([place]) 12/19/2008  . BACK PAIN, CHRONIC 12/19/2008  . Abdominal pain 12/19/2008  . CERVICAL CANCER, HX OF 12/19/2008  . Nephrolithiasis 12/19/2008    Past Surgical History:  Procedure Laterality Date  . ABDOMINAL HYSTERECTOMY    . APPENDECTOMY    . CATARACT EXTRACTION W/PHACO Right 05/04/2013   Procedure: CATARACT EXTRACTION PHACO AND INTRAOCULAR LENS PLACEMENT (IOC);  Surgeon: Tonny Branch, MD;  Location: [facility];  Service: Ophthalmology;  Laterality: Right;  CDE:16.17  . CATARACT EXTRACTION W/PHACO Left 05/22/2013   Procedure: CATARACT EXTRACTION PHACO AND INTRAOCULAR LENS PLACEMENT (IOC);  Surgeon: Tonny Branch, MD;  Location: [facility];  Service: Ophthalmology;  Laterality: Left;  CDE:14.75  . COLONOSCOPY N/A 02/08/2014   Dr.Rourk- normal rectum, colonic polyps bx=tubular adenoma  . COLONOSCOPY W/ POLYPECTOMY  2006   DXI:[id number], benign gastric nodule, multiple adenomatous polyps, one with tubular morphology  . ESOPHAGOGASTRODUODENOSCOPY  2006   BHA:[id number] gastric nodule  . ESOPHAGOGASTRODUODENOSCOPY N/A 02/08/2014   Dr.Rourk- abnormal stomach and gstric nodule bx= hpylori  . EUS  2010   Dr. Ardis Hughs : Multiple shadowing calcifications in pancreas, consistent with Chronic Pancreatitis.  These are mainly confined to a collection of calcifications in head/uncinate pancreas.  Otherwise the pancreatic parenchyma appears fairly normal.  The main pancreatic duct, CBD are both normal without stones, dilation.    . Ileocolonoscopy  01/11/2009   RMR: Polyp at the splenic flexure, status post hot snare removal/ Normal rectum, tubular adenoma  . PARTIAL HYSTERECTOMY      OB History    Gravida Para Term Preterm AB Living   _0 SAB TAB Ectopic Multiple Live Births   3               Home Medications    Prior to Admission medications   Medication Sig Start Date End Date Taking? Authorizing  Provider  acetaminophen (TYLENOL) 325 MG tablet Take 650 mg by mouth every 6 (six) hours as needed for mild pain.    Historical Provider, MD  ADVAIR DISKUS 250-50 MCG/DOSE AEPB Inhale 1 puff into the lungs 2 (two) times daily.  10/05/12   Historical Provider, MD  Calcium Carbonate (CALCIUM 600 PO) Take 1 tablet by mouth daily.      Historical Provider, MD  cilostazol (PLETAL) 100 MG tablet Take 1 tablet by mouth 2 (two) times daily. 03/29/15   Historical Provider, MD  CREON 24000-76000 units CPEP TAKE 2 CAPSULES WITH EACH MEALS AND 1 CAPSULE WITH SNACKS. 11/30/16   Carlis Stable, NP  dronabinol (MARINOL) 2.5 MG capsule Take 2.5 mg by mouth daily.    Historical Provider, MD  DULoxetine (CYMBALTA) 60 MG capsule Take 60 mg by mouth 2 (two) times daily.     Historical Provider, MD  fentaNYL (DURAGESIC - DOSED MCG/HR) 50 MCG/HR Place 37.5 mcg onto the skin every 3 (three) days.  11/05/16   Historical Provider, MD  hydrOXYzine (ATARAX/VISTARIL) 25 MG tablet Take 1 tablet by mouth 3 (three) times daily as needed for anxiety or itching.  04/21/16   Historical Provider, MD  linaclotide (LINZESS) 72 MCG capsule Take 1 capsule (72 mcg total) by mouth daily before breakfast. 12/01/16   Carlis Stable, NP  LORazepam (ATIVAN) 1 MG tablet Take 1 mg by mouth every 8 (eight) hours as needed for anxiety. Reported on 11/19/2015    Historical Provider, MD  Na Sulfate-K Sulfate-Mg Sulf (SUPREP BOWEL PREP KIT) 17.5-3.13-1.6 GM/180ML SOLN Take 1 kit by mouth as directed. 01/06/17   Daneil Dolin, MD  nitroGLYCERIN (NITROSTAT) 0.4 MG SL tablet Place 1 tablet (0.4 mg total) under the tongue every 5 (five) minutes as needed. 11/21/14   Herminio Commons, MD  omeprazole (PRILOSEC) 20 MG capsule TAKE 1 CAPSULE BY MOUTH ONCE DAILY. 06/10/16   Mahala Menghini, PA-C  promethazine (PHENERGAN) 25 MG tablet Take 25 mg by mouth every 6 (six) hours as needed for nausea or vomiting.     Historical Provider, MD  RESTASIS 0.05 % ophthalmic emulsion Place 1  drop into both eyes 2 (two) times daily. 03/27/16   Historical Provider, MD    Family History Family History  Problem Relation Age of Onset  . Heart attack Father 63  . Colon cancer Maternal Grandmother     Social History Social History  Substance Use Topics  . Smoking status: Current Every Day Smoker    Packs/day: 0.50    Years: 50.00    Types: Cigarettes    Start date: 07/18/1962  . Smokeless tobacco: Never Used     Comment: 3/4 pack daily  . Alcohol use No     Comment: hx of ETOH about 20 years ago.  Allergies   Ibuprofen; Iohexol; and Strawberry (diagnostic)   Review of Systems Review of Systems  Constitutional: Positive for chills and fever. Negative for activity change.       All ROS Neg except as noted in HPI  HENT: Negative for nosebleeds.   Eyes: Negative for photophobia and discharge.  Respiratory: Negative for cough, shortness of breath and wheezing.   Cardiovascular: Negative for chest pain and palpitations.  Gastrointestinal: Negative for abdominal pain and blood in stool.  Genitourinary: Positive for vaginal pain. Negative for dysuria, frequency and hematuria.  Musculoskeletal: Negative for arthralgias, back pain and neck pain.  Skin: Negative.   Neurological: Negative for dizziness, seizures and speech difficulty.  Psychiatric/Behavioral: Negative for confusion and hallucinations.     Physical Exam Updated Vital Signs BP (!) 141/78 (BP Location: Right Arm)   Pulse 88   Temp 98.1 F (36.7 C) (Oral)   Resp 18   Ht _0  (1.626 m)   Wt 33.6 kg   SpO2 97%   BMI 12.70 kg/m   Physical Exam  Constitutional: She is oriented to person, place, and time. She appears well-developed and well-nourished.  Non-toxic appearance.  HENT:  Head: Normocephalic.  Right Ear: Tympanic membrane and external ear normal.  Left Ear: Tympanic membrane and external ear normal.  Eyes: EOM and lids are normal. Pupils are equal, round, and reactive to light.  Neck:  Normal range of motion. Neck supple. Carotid bruit is not present.  Cardiovascular: Normal rate, regular rhythm, normal heart sounds, intact distal pulses and normal pulses.   Pulmonary/Chest: Breath sounds normal. No respiratory distress.  Abdominal: Soft. Bowel sounds are normal. There is no tenderness. There is no guarding.  Genitourinary:     Genitourinary Comments: Chaperrone present during the exam. Pt has a small cyst at the upper area of the left labia with a scabbed area present. No red streaks. No drainage noted.  Musculoskeletal: Normal range of motion.  Lymphadenopathy:       Head (right side): No submandibular adenopathy present.       Head (left side): No submandibular adenopathy present.    She has no cervical adenopathy.  Neurological: She is alert and oriented to person, place, and time. She has normal strength. No cranial nerve deficit or sensory deficit.  Skin: Skin is warm and dry.  Psychiatric: She has a normal mood and affect. Her speech is normal.  Nursing note and vitals reviewed.    ED Treatments / Results  Labs (all labs ordered are listed, but only abnormal results are displayed) Labs Reviewed - No data to display  EKG  EKG Interpretation None       Radiology No results found.  Procedures Procedures (including critical care time)  Medications Ordered in ED Medications  doxycycline (VIBRA-TABS) tablet 100 mg (not administered)  acetaminophen (TYLENOL) tablet 1,000 mg (not administered)     Initial Impression / Assessment and Plan / ED Course  I have reviewed the triage vital signs and the nursing notes.  Pertinent labs & imaging results that were available during my care of the patient were reviewed by me and considered in my medical decision making (see chart for details).     **I have reviewed nursing notes, vital signs, and all appropriate lab and imaging results for this patient.*  Final Clinical Impressions(s) / ED  Diagnoses MDM Pt has a cyst that has been present on the vagina for 5 years. She has a scab on the area and it feels  more tender to the patient than usual. No fever or chills. Cyst is not a candidate for I and D. Pt will be treated with antibiotics and warm soaks. Pt to follow up with PCP if not improving.   Final diagnoses:  Infected cyst of skin    New Prescriptions Discharge Medication List as of 01/22/2017 12:20 AM    START taking these medications   Details  doxycycline (VIBRAMYCIN) 100 MG capsule Take 1 capsule (100 mg total) by mouth 2 (two) times daily., Starting Fri 01/22/2017, Print         [place], PA-C 01/22/17 [id number]    Ripley Fraise, MD 01/22/17 [id number]

## 2017-02-08 ENCOUNTER — Other Ambulatory Visit (HOSPITAL_COMMUNITY): Payer: Medicare Other

## 2017-02-08 NOTE — Patient Instructions (Signed)
Romana Deaton Hesser  02/08/2017     @PREFPERIOPPHARMACY @   Your procedure is scheduled on  02/17/2017   Report to [facility] at  1100  A.M.  Call this number if you have problems the morning of surgery:  [phone]   Remember:  Do not eat food or drink liquids after midnight.  Take these medicines the morning of surgery with A SIP OF WATER  Cymbalta, atarax, ativan, prilosec, phenergan.   Do not wear jewelry, make-up or nail polish.  Do not wear lotions, powders, or perfumes, or deoderant.  Do not shave 48 hours prior to surgery.  Men may shave face and neck.  Do not bring valuables to the hospital.  [facility] is not responsible for any belongings or valuables.  Contacts, dentures or bridgework may not be worn into surgery.  Leave your suitcase in the car.  After surgery it may be brought to your room.  For patients admitted to the hospital, discharge time will be determined by your treatment team.  Patients discharged the day of surgery will not be allowed to drive home.   Name and phone number of your driver:   family Special instructions:  Follow the diet and prep instructions given to you by Dr Roseanne Kaufman office.  Please read over the following fact sheets that you were given. Anesthesia Post-op Instructions and Care and Recovery After Surgery       Colonoscopy, Adult A colonoscopy is an exam to look at the entire large intestine. During the exam, a lubricated, bendable tube is inserted into the anus and then passed into the rectum, colon, and other parts of the large intestine. A colonoscopy is often done as a part of normal colorectal screening or in response to certain symptoms, such as anemia, persistent diarrhea, abdominal pain, and blood in the stool. The exam can help screen for and diagnose medical problems, including:  Tumors.  Polyps.  Inflammation.  Areas of bleeding. Tell a health care provider about:  Any allergies you have.  All  medicines you are taking, including vitamins, herbs, eye drops, creams, and over-the-counter medicines.  Any problems you or family members have had with anesthetic medicines.  Any blood disorders you have.  Any surgeries you have had.  Any medical conditions you have.  Any problems you have had passing stool. What are the risks? Generally, this is a safe procedure. However, problems may occur, including:  Bleeding.  A tear in the intestine.  A reaction to medicines given during the exam.  Infection (rare). What happens before the procedure? Eating and drinking restrictions  Follow instructions from your health care provider about eating and drinking, which may include:  A few days before the procedure - follow a low-fiber diet. Avoid nuts, seeds, dried fruit, raw fruits, and vegetables.  1-3 days before the procedure - follow a clear liquid diet. Drink only clear liquids, such as clear broth or bouillon, black coffee or tea, clear juice, clear soft drinks or sports drinks, gelatin dessert, and popsicles. Avoid any liquids that contain red or purple dye.  On the day of the procedure - do not eat or drink anything during the 2 hours before the procedure, or within the time period that your health care provider recommends. Bowel prep  If you were prescribed an oral bowel prep to clean out your colon:  Take it as told by your health care provider. Starting the day before your  procedure, you will need to drink a large amount of medicated liquid. The liquid will cause you to have multiple loose stools until your stool is almost clear or light green.  If your skin or anus gets irritated from diarrhea, you may use these to relieve the irritation:  Medicated wipes, such as adult wet wipes with aloe and vitamin E.  A skin soothing-product like petroleum jelly.  If you vomit while drinking the bowel prep, take a break for up to 60 minutes and then begin the bowel prep again. If  vomiting continues and you cannot take the bowel prep without vomiting, call your health care provider. General instructions   Ask your health care provider about changing or stopping your regular medicines. This is especially important if you are taking diabetes medicines or blood thinners.  Plan to have someone take you home from the hospital or clinic. What happens during the procedure?  An IV tube may be inserted into one of your veins.  You will be given medicine to help you relax (sedative).  To reduce your risk of infection:  Your health care team will wash or sanitize their hands.  Your anal area will be washed with soap.  You will be asked to lie on your side with your knees bent.  Your health care provider will lubricate a long, thin, flexible tube. The tube will have a camera and a light on the end.  The tube will be inserted into your anus.  The tube will be gently eased through your rectum and colon.  Air will be delivered into your colon to keep it open. You may feel some pressure or cramping.  The camera will be used to take images during the procedure.  A small tissue sample may be removed from your body to be examined under a microscope (biopsy). If any potential problems are found, the tissue will be sent to a lab for testing.  If small polyps are found, your health care provider may remove them and have them checked for cancer cells.  The tube that was inserted into your anus will be slowly removed. The procedure may vary among health care providers and hospitals. What happens after the procedure?  Your blood pressure, heart rate, breathing rate, and blood oxygen level will be monitored until the medicines you were given have worn off.  Do not drive for 24 hours after the exam.  You may have a small amount of blood in your stool.  You may pass gas and have mild abdominal cramping or bloating due to the air that was used to inflate your colon during the  exam.  It is up to you to get the results of your procedure. Ask your health care provider, or the department performing the procedure, when your results will be ready. This information is not intended to replace advice given to you by your health care provider. Make sure you discuss any questions you have with your health care provider. Document Released: 09/11/2000 Document Revised: 07/15/2016 Document Reviewed: 11/26/2015 Elsevier Interactive Patient Education  2017 [employer]  Colonoscopy, Adult, Care After This sheet gives you information about how to care for yourself after your procedure. Your health care provider may also give you more specific instructions. If you have problems or questions, contact your health care provider. What can I expect after the procedure? After the procedure, it is common to have:  A small amount of blood in your stool for 24 hours after the procedure.  Some gas.  Mild abdominal cramping or bloating. Follow these instructions at home: General instructions    For the first 24 hours after the procedure:  Do not drive or use machinery.  Do not sign important documents.  Do not drink alcohol.  Do your regular daily activities at a slower pace than normal.  Eat soft, easy-to-digest foods.  Rest often.  Take over-the-counter or prescription medicines only as told by your health care provider.  It is up to you to get the results of your procedure. Ask your health care provider, or the department performing the procedure, when your results will be ready. Relieving cramping and bloating   Try walking around when you have cramps or feel bloated.  Apply heat to your abdomen as told by your health care provider. Use a heat source that your health care provider recommends, such as a moist heat pack or a heating pad.  Place a towel between your skin and the heat source.  Leave the heat on for 20-30 minutes.  Remove the heat if your skin turns  bright red. This is especially important if you are unable to feel pain, heat, or cold. You may have a greater risk of getting burned. Eating and drinking   Drink enough fluid to keep your urine clear or pale yellow.  Resume your normal diet as instructed by your health care provider. Avoid heavy or fried foods that are hard to digest.  Avoid drinking alcohol for as long as instructed by your health care provider. Contact a health care provider if:  You have blood in your stool 2-3 days after the procedure. Get help right away if:  You have more than a small spotting of blood in your stool.  You pass large blood clots in your stool.  Your abdomen is swollen.  You have nausea or vomiting.  You have a fever.  You have increasing abdominal pain that is not relieved with medicine. This information is not intended to replace advice given to you by your health care provider. Make sure you discuss any questions you have with your health care provider. Document Released: 04/28/2004 Document Revised: 06/08/2016 Document Reviewed: 11/26/2015 Elsevier Interactive Patient Education  2017 [place] Anesthesia is a term that refers to techniques, procedures, and medicines that help a person stay safe and comfortable during a medical procedure. Monitored anesthesia care, or sedation, is one type of anesthesia. Your anesthesia specialist may recommend sedation if you will be having a procedure that does not require you to be unconscious, such as:  Cataract surgery.  A dental procedure.  A biopsy.  A colonoscopy. During the procedure, you may receive a medicine to help you relax (sedative). There are three levels of sedation:  Mild sedation. At this level, you may feel awake and relaxed. You will be able to follow directions.  Moderate sedation. At this level, you will be sleepy. You may not remember the procedure.  Deep sedation. At this level, you will be  asleep. You will not remember the procedure. The more medicine you are given, the deeper your level of sedation will be. Depending on how you respond to the procedure, the anesthesia specialist may change your level of sedation or the type of anesthesia to fit your needs. An anesthesia specialist will monitor you closely during the procedure. Let your health care provider know about:  Any allergies you have.  All medicines you are taking, including vitamins, herbs, eye drops, creams, and  over-the-counter medicines.  Any use of steroids (by mouth or as a cream).  Any problems you or family members have had with sedatives and anesthetic medicines.  Any blood disorders you have.  Any surgeries you have had.  Any medical conditions you have, such as sleep apnea.  Whether you are pregnant or may be pregnant.  Any use of cigarettes, alcohol, or street drugs. What are the risks? Generally, this is a safe procedure. However, problems may occur, including:  Getting too much medicine (oversedation).  Nausea.  Allergic reaction to medicines.  Trouble breathing. If this happens, a breathing tube may be used to help with breathing. It will be removed when you are awake and breathing on your own.  Heart trouble.  Lung trouble. Before the procedure Staying hydrated  Follow instructions from your health care provider about hydration, which may include:  Up to 2 hours before the procedure - you may continue to drink clear liquids, such as water, clear fruit juice, black coffee, and plain tea. Eating and drinking restrictions  Follow instructions from your health care provider about eating and drinking, which may include:  8 hours before the procedure - stop eating heavy meals or foods such as meat, fried foods, or fatty foods.  6 hours before the procedure - stop eating light meals or foods, such as toast or cereal.  6 hours before the procedure - stop drinking milk or drinks that  contain milk.  2 hours before the procedure - stop drinking clear liquids. Medicines  Ask your health care provider about:  Changing or stopping your regular medicines. This is especially important if you are taking diabetes medicines or blood thinners.  Taking medicines such as aspirin and ibuprofen. These medicines can thin your blood. Do not take these medicines before your procedure if your health care provider instructs you not to. Tests and exams  You will have a physical exam.  You may have blood tests done to show:  How well your kidneys and liver are working.  How well your blood can clot.  General instructions  Plan to have someone take you home from the hospital or clinic.  If you will be going home right after the procedure, plan to have someone with you for 24 hours. What happens during the procedure?  Your blood pressure, heart rate, breathing, level of pain and overall condition will be monitored.  An IV tube will be inserted into one of your veins.  Your anesthesia specialist will give you medicines as needed to keep you comfortable during the procedure. This may mean changing the level of sedation.  The procedure will be performed. After the procedure  Your blood pressure, heart rate, breathing rate, and blood oxygen level will be monitored until the medicines you were given have worn off.  Do not drive for 24 hours if you received a sedative.  You may:  Feel sleepy, clumsy, or nauseous.  Feel forgetful about what happened after the procedure.  Have a sore throat if you had a breathing tube during the procedure.  Vomit. This information is not intended to replace advice given to you by your health care provider. Make sure you discuss any questions you have with your health care provider. Document Released: 06/10/2005 Document Revised: 02/21/2016 Document Reviewed: 01/05/2016 Elsevier Interactive Patient Education  2017 [place], Care After These instructions provide you with information about caring for yourself after your procedure. Your health care provider may also give you  more specific instructions. Your treatment has been planned according to current medical practices, but problems sometimes occur. Call your health care provider if you have any problems or questions after your procedure. What can I expect after the procedure? After your procedure, it is common to:  Feel sleepy for several hours.  Feel clumsy and have poor balance for several hours.  Feel forgetful about what happened after the procedure.  Have poor judgment for several hours.  Feel nauseous or vomit.  Have a sore throat if you had a breathing tube during the procedure. Follow these instructions at home: For at least 24 hours after the procedure:    Do not:  Participate in activities in which you could fall or become injured.  Drive.  Use heavy machinery.  Drink alcohol.  Take sleeping pills or medicines that cause drowsiness.  Make important decisions or sign legal documents.  Take care of children on your own.  Rest. Eating and drinking   Follow the diet that is recommended by your health care provider.  If you vomit, drink water, juice, or soup when you can drink without vomiting.  Make sure you have little or no nausea before eating solid foods. General instructions   Have a responsible adult stay with you until you are awake and alert.  Take over-the-counter and prescription medicines only as told by your health care provider.  If you smoke, do not smoke without supervision.  Keep all follow-up visits as told by your health care provider. This is important. Contact a health care provider if:  You keep feeling nauseous or you keep vomiting.  You feel light-headed.  You develop a rash.  You have a fever. Get help right away if:  You have trouble breathing. This information is not  intended to replace advice given to you by your health care provider. Make sure you discuss any questions you have with your health care provider. Document Released: 01/05/2016 Document Revised: 05/06/2016 Document Reviewed: 01/05/2016 Elsevier Interactive Patient Education  2017 [employer].

## 2017-02-11 ENCOUNTER — Encounter (HOSPITAL_COMMUNITY): Admission: RE | Payer: Self-pay | Source: Ambulatory Visit

## 2017-02-11 SURGERY — COLONOSCOPY
Anesthesia: Moderate Sedation

## 2017-02-12 ENCOUNTER — Encounter (HOSPITAL_COMMUNITY): Payer: Self-pay

## 2017-02-12 ENCOUNTER — Encounter (HOSPITAL_COMMUNITY)
Admission: RE | Admit: 2017-02-12 | Discharge: 2017-02-12 | Disposition: A | Discharge status: Home / Self Care | Payer: Medicare Other | Source: Ambulatory Visit | Attending: Internal Medicine | Admitting: Internal Medicine

## 2017-02-15 ENCOUNTER — Telehealth: Payer: Self-pay

## 2017-02-15 NOTE — Telephone Encounter (Signed)
Patient will need an ov with the doctor and at that time we will need to state expectations on cancelling procedures and the third attempt will result in dismissal from the practice.

## 2017-02-15 NOTE — Telephone Encounter (Signed)
This will be the second cancelled TCS. Routing to CM.

## 2017-02-15 NOTE — Telephone Encounter (Signed)
Pt called to inform us that she did not make her pre-op appointment because she has been sick. She would like to reschedule her TCS because she is still sick.

## 2017-02-15 NOTE — Telephone Encounter (Signed)
Patient has an appt 6/11 with EG

## 2017-02-17 ENCOUNTER — Ambulatory Visit (HOSPITAL_COMMUNITY): Admission: RE | Admit: 2017-02-17 | Payer: Medicare Other | Source: Ambulatory Visit | Admitting: Internal Medicine

## 2017-02-17 ENCOUNTER — Encounter (HOSPITAL_COMMUNITY): Admission: RE | Payer: Self-pay | Source: Ambulatory Visit

## 2017-02-17 SURGERY — COLONOSCOPY WITH PROPOFOL
Anesthesia: Monitor Anesthesia Care

## 2017-03-08 ENCOUNTER — Telehealth: Payer: Self-pay

## 2017-03-08 ENCOUNTER — Encounter: Payer: Self-pay | Admitting: Nurse Practitioner

## 2017-03-08 ENCOUNTER — Other Ambulatory Visit: Payer: Self-pay

## 2017-03-08 ENCOUNTER — Ambulatory Visit (INDEPENDENT_AMBULATORY_CARE_PROVIDER_SITE_OTHER): Payer: Medicare Other | Admitting: Nurse Practitioner

## 2017-03-08 VITALS — BP 116/73 | HR 68 | Temp 97.8°F | Ht 66.0 in | Wt 77.6 lb

## 2017-03-08 DIAGNOSIS — R109 Unspecified abdominal pain: Secondary | ICD-10-CM

## 2017-03-08 DIAGNOSIS — R634 Abnormal weight loss: Secondary | ICD-10-CM

## 2017-03-08 DIAGNOSIS — R1011 Right upper quadrant pain: Secondary | ICD-10-CM

## 2017-03-08 MED ORDER — PEG 3350-KCL-NA BICARB-NACL 420 G PO SOLR: 4000.0000 mL | ORAL | 0 refills | Status: DC

## 2017-03-08 NOTE — Progress Notes (Signed)
Referring Provider: Lemmie Evens, MD Primary Care Physician:  Lemmie Evens, MD Primary GI:  Dr. Gala Romney  Chief Complaint  Patient presents with  . Abdominal Pain  . Weight Loss    HPI:   [name] is a 71 y.o. female who presents for follow-up on abdominal pain and weight loss. She has a history of idiopathic chronic pancreatitis. Her PCP had been reducing her opioids and this resulted in more musculoskeletal pain. Going a week between bowel movements then multiple bowel movements over 2 days. Weight down 4 pounds in previous 4 months. Movantik previously worked then stopped. Trulance did not work. Recommended a trial of Linzess for 72 g. Due to weight loss and a phone note indicating failure to thrive recommended additional workup and if pancreatic component should be referred to [facility] for consideration of celiac plexus ablation.  At her last visit on 01/06/2017 she stated she was "feeling rough." Constipation resolved on Linzess, continued to lose 3 more pounds that week and she is down 2 pounds since February 2018. Abdominal pain and back pain with noted "pain all over because there weaning me down off patches and not starting anything else. I've been on fentanyl for the past 15 years." Abdominal pain periumbilical to epigastric, intermittent worsening, dull to "just like irregular pain" with limited ability to describe. States her abdominal pain is chronic and she is adequate for 24 years. Also having nausea and vomiting with her most recent emesis episode 2 days prior and manages this with Phenergan.  On exam and her last visit her pain was right upper quadrant and associated with nausea, previous ultrasound without choledocholithiasis. HIDA scan previously recommended was not completed and I recommended a HIDA scan. Recommended colonoscopy with possible upper endoscopy on propofol/MAC due to weight loss with her weight at her last visit currently only 75.8 pounds. She ended  up missing her preop appointment (and subsequently TCS/EGD), which was her second procedure cancellation. Her HIDA scan was normal with a calculated ejection fraction of 49%.  Today she states she's doing better. Abdominal pain in better "now that they put me on more pain medicine. It's still there but tolerable now." She has added a couple pounds (no further weight loss). Appetite getting better. Denies hematochezia, melena. Has a bowel movement about once a week (which is better than it used to be.) Is still on Linzess. Denies straining. Stools are loose. Has no warning when she needs to go and may go 3-4 times that day. Has never had daily bowel movements. Denies fever, chills, sudden changes in bowel habits. Denies chest pain, dyspnea, dizziness, lightheadedness, syncope, near syncope. Denies any other upper or lower GI symptoms.  She states she got as low as 73 lbs before they restarted her pain medicine and she is now gaining weight again.   Past Medical History:  Diagnosis Date  . Allergic rhinitis   . Anxiety   . Anxiety and depression   . Back pain, chronic   . CAD (coronary artery disease)    palpitations, dizziness, chest pain  . Cervical cancer ([place])   . Chronic abdominal pain   . COPD (chronic obstructive pulmonary disease) ([place])   . Depression   . Family history of anesthesia complication   . GERD (gastroesophageal reflux disease)   . Helicobacter pylori infection 2015   treated with prevpac  . Hematochezia   . Hx of colonic polyps    adenomatous  . Hyperlipidemia    Lipid profile  on 02/25/2011: 209, 113, 55, 132  . Nephrolithiasis   . Pancreatitis chronic   . Renal calculus   . Stroke [facility]) 3-4 yrs ago   left sided weakness  . Tobacco abuse   . Tubular adenoma 2015  . Weight loss    CT negative for occult malignancy, negative celiac, negative adrenal insufficiency    Past Surgical History:  Procedure Laterality Date  . ABDOMINAL HYSTERECTOMY    . APPENDECTOMY      . CATARACT EXTRACTION W/PHACO Right 05/04/2013   Procedure: CATARACT EXTRACTION PHACO AND INTRAOCULAR LENS PLACEMENT (IOC);  Surgeon: Tonny Branch, MD;  Location: [facility];  Service: Ophthalmology;  Laterality: Right;  CDE:16.17  . CATARACT EXTRACTION W/PHACO Left 05/22/2013   Procedure: CATARACT EXTRACTION PHACO AND INTRAOCULAR LENS PLACEMENT (IOC);  Surgeon: Tonny Branch, MD;  Location: [facility];  Service: Ophthalmology;  Laterality: Left;  CDE:14.75  . COLONOSCOPY N/A 02/08/2014   Dr.Rourk- normal rectum, colonic polyps bx=tubular adenoma  . COLONOSCOPY W/ POLYPECTOMY  2006   [facility]:[id number], benign gastric nodule, multiple adenomatous polyps, one with tubular morphology  . ESOPHAGOGASTRODUODENOSCOPY  2006   HYI:FOYDXA gastric nodule  . ESOPHAGOGASTRODUODENOSCOPY N/A 02/08/2014   Dr.Rourk- abnormal stomach and gstric nodule bx= hpylori  . EUS  2010   Dr. Ardis Hughs : Multiple shadowing calcifications in pancreas, consistent with Chronic Pancreatitis.  These are mainly confined to a collection of calcifications in head/uncinate pancreas. Otherwise the pancreatic parenchyma appears fairly normal.  The main pancreatic duct, CBD are both normal without stones, dilation.    . Ileocolonoscopy  01/11/2009   RMR: Polyp at the splenic flexure, status post hot snare removal/ Normal rectum, tubular adenoma  . PARTIAL HYSTERECTOMY      Current Outpatient Prescriptions  Medication Sig Dispense Refill  . acetaminophen (TYLENOL) 325 MG tablet Take 650 mg by mouth every 6 (six) hours as needed for mild pain.    Marland Kitchen ADVAIR DISKUS 250-50 MCG/DOSE AEPB Inhale 1 puff into the lungs 2 (two) times daily.     . Calcium Carbonate (CALCIUM 600 PO) Take 1 tablet by mouth daily.      . cilostazol (PLETAL) 100 MG tablet Take 1 tablet by mouth 2 (two) times daily.    Marland Kitchen CREON 24000-76000 units CPEP TAKE 2 CAPSULES WITH EACH MEALS AND 1 CAPSULE WITH SNACKS. 240 capsule 5  . doxycycline (VIBRAMYCIN) 100 MG capsule Take 1 capsule (100  mg total) by mouth 2 (two) times daily. 14 capsule 0  . dronabinol (MARINOL) 2.5 MG capsule Take 2.5 mg by mouth daily.    . DULoxetine (CYMBALTA) 60 MG capsule Take 60 mg by mouth 2 (two) times daily.     . fentaNYL (DURAGESIC - DOSED MCG/HR) 50 MCG/HR Place 37.5 mcg onto the skin every 3 (three) days.     . hydrOXYzine (ATARAX/VISTARIL) 25 MG tablet Take 1 tablet by mouth 3 (three) times daily as needed for anxiety or itching.     . linaclotide (LINZESS) 72 MCG capsule Take 1 capsule (72 mcg total) by mouth daily before breakfast. 30 capsule 5  . LORazepam (ATIVAN) 1 MG tablet Take 1 mg by mouth every 8 (eight) hours as needed for anxiety. Reported on 11/19/2015    . nitroGLYCERIN (NITROSTAT) 0.4 MG SL tablet Place 1 tablet (0.4 mg total) under the tongue every 5 (five) minutes as needed. 25 tablet 3  . omeprazole (PRILOSEC) 20 MG capsule TAKE 1 CAPSULE BY MOUTH ONCE DAILY. 30 capsule 11  . oxyCODONE-acetaminophen (PERCOCET) 7.5-325 MG  tablet     . promethazine (PHENERGAN) 25 MG tablet Take 25 mg by mouth every 6 (six) hours as needed for nausea or vomiting.     . RESTASIS 0.05 % ophthalmic emulsion Place 1 drop into both eyes 2 (two) times daily.     No current facility-administered medications for this visit.     Allergies as of 03/08/2017 - Review Complete 03/08/2017  Allergen Reaction Noted  . Ibuprofen Anaphylaxis and Hives 12/19/2008  . Iohexol Hives and Swelling 05/23/2004  . Strawberry (diagnostic) Itching 11/09/2015    Family History  Problem Relation Age of Onset  . Heart attack Father 18  . Colon cancer Maternal Grandmother     Social History   Social History  . Marital status: Divorced    Spouse name: N/A  . Number of children: 3  . Years of education: N/A   Occupational History  . disabled Unemployed   Social History Main Topics  . Smoking status: Current Every Day Smoker    Packs/day: 0.50    Years: 50.00    Types: Cigarettes    Start date: 07/18/1962  .  Smokeless tobacco: Never Used     Comment: 3/4 pack daily  . Alcohol use No     Comment: hx of ETOH about 20 years ago.   . Drug use: No  . Sexual activity: No   Other Topics Concern  . None   Social History Narrative  . None    Review of Systems: General: Negative for anorexia, weight loss, fever, chills, fatigue, weakness. Eyes: Negative for vision changes.  ENT: Negative for hoarseness, difficulty swallowing , nasal congestion. CV: Negative for chest pain, angina, palpitations, dyspnea on exertion, peripheral edema.  Respiratory: Negative for dyspnea at rest, dyspnea on exertion, cough, sputum, wheezing.  GI: See history of present illness. GU:  Negative for dysuria, hematuria, urinary incontinence, urinary frequency, nocturnal urination.  MS: Negative for joint pain, low back pain.  Derm: Negative for rash or itching.  Neuro: Negative for weakness, abnormal sensation, seizure, frequent headaches, memory loss, confusion.  Psych: Negative for anxiety, depression, suicidal ideation, hallucinations.  Endo: Negative for unusual weight change.  Heme: Negative for bruising or bleeding. Allergy: Negative for rash or hives.   Physical Exam: BP 116/73   Pulse 68   Temp 97.8 F (36.6 C) (Oral)   Ht _0  (1.676 m)   Wt 77 lb 9.6 oz (35.2 kg)   BMI 12.52 kg/m  General:   Alert and oriented. Pleasant and cooperative. Well-nourished and well-developed.  Head:  Normocephalic and atraumatic. Eyes:  Without icterus, sclera clear and conjunctiva pink.  Ears:  Normal auditory acuity. Mouth:  No deformity or lesions, oral mucosa pink.  Throat/Neck:  Supple, without mass or thyromegaly. Cardiovascular:  S1, S2 present without murmurs appreciated. Normal pulses noted. Extremities without clubbing or edema. Respiratory:  Clear to auscultation bilaterally. No wheezes, rales, or rhonchi. No distress.  Gastrointestinal:  +BS, soft, non-tender and non-distended. No HSM noted. No guarding or  rebound. No masses appreciated.  Rectal:  Deferred  Musculoskalatal:  Symmetrical without gross deformities. Normal posture. Skin:  Intact without significant lesions or rashes. Neurologic:  Alert and oriented x4;  grossly normal neurologically. Psych:  Alert and cooperative. Normal mood and affect. Heme/Lymph/Immune: No significant cervical adenopathy. No excessive bruising noted.    03/08/2017 9:32 AM   Disclaimer: This note was dictated with voice recognition software. Similar sounding words can inadvertently be transcribed and may not be corrected  upon review.

## 2017-03-08 NOTE — Patient Instructions (Signed)
1. We will schedule your procedure again. 2. It is very important to make it to your procedure. 3. Return for follow-up in 3 months.

## 2017-03-08 NOTE — Assessment & Plan Note (Addendum)
Her abdominal pain is improved on pain medicine, still has discomfort but it is tolerable. Recent HIDA scan normal. CT of the abdomen and pelvis with contrast completed in September 2017 which shows compatible with colitis, infectious versus inflammatory. We have tried to have a colonoscopy done but she has canceled last 2 colonoscopies. I explained to her that this will likely be the last time that they will schedule a colonoscopy and if she misses again that she would potentially be discharged from the practice, per notes from the office manager. Interestingly her CT was unable to rule out ischemic colitis. If her colonoscopy is normal a CT angiogram would likely be helpful. She does have chronic pancreatitis and is on Creon, which could be responsible at least in part as well. We will proceed with colonoscopy as per above. Return for follow-up in 3 months.

## 2017-03-08 NOTE — Assessment & Plan Note (Signed)
The patient noted she got as low as 73 pounds before her pain medicine was restarted and she subsequently has been gaining weight. She is almost at 78 pounds now which is a increase of 3 pounds from her last visit, or subjective increase of 5 pounds. Despite this, she is still very thin and with poor appetite/failure to thrive. We have attempted to complete a colonoscopy twice and she has canceled due to these. We will make a third attempt. Denies any other red flag/warning signs or symptoms such as hematochezia, melena, acute changes in bowel habits, fever of unknown origin. Return for follow-up in 3 months. She is to call us for any worsening or severe symptoms.  Proceed with TCS on propofol/MAC with Dr. Gala Romney in near future: the risks, benefits, and alternatives have been discussed with the patient in detail. The patient states understanding and desires to proceed.  The patient is currently on Marinol, Cymbalta, fentanyl Duragesic patch, hydroxyzine, Ativan, Percocet, Phenergan. No other anticoagulants, anxiolytics, her chronic pain medications. Given polypharmacy we'll plan for propofol/MAC to promote adequate sedation.

## 2017-03-08 NOTE — Progress Notes (Signed)
cc'ed to pcp °

## 2017-03-08 NOTE — Telephone Encounter (Signed)
Called and informed pt of pre-op appt 04/13/17 at 10:00am. Letter also mailed.

## 2017-03-08 NOTE — Patient Instructions (Signed)
PA info for Colonoscopy submitted via [facility] website. No PA needed. Decision ID# [id number].

## 2017-03-22 ENCOUNTER — Ambulatory Visit (INDEPENDENT_AMBULATORY_CARE_PROVIDER_SITE_OTHER): Payer: Medicare Other | Admitting: Otolaryngology

## 2017-03-22 DIAGNOSIS — R070 Pain in throat: Secondary | ICD-10-CM

## 2017-03-22 DIAGNOSIS — R490 Dysphonia: Secondary | ICD-10-CM

## 2017-03-22 DIAGNOSIS — F17210 Nicotine dependence, cigarettes, uncomplicated: Secondary | ICD-10-CM

## 2017-04-09 ENCOUNTER — Other Ambulatory Visit (HOSPITAL_COMMUNITY): Payer: Self-pay | Admitting: Family Medicine

## 2017-04-09 DIAGNOSIS — M810 Age-related osteoporosis without current pathological fracture: Secondary | ICD-10-CM

## 2017-04-12 NOTE — Patient Instructions (Signed)
[name]  04/12/2017     @PREFPERIOPPHARMACY @   Your procedure is scheduled on  04/15/2017  Report to [facility] at  31  A.M.  Call this number if you have problems the morning of surgery:  [phone]   Remember:  Do not eat food or drink liquids after midnight.  Take these medicines the morning of surgery with A SIP OF WATER  Cymbalta, lexapro, ativan, prilosec, oxycodone, phenergan. Use your inhalers before you come.   Do not wear jewelry, make-up or nail polish.  Do not wear lotions, powders, or perfumes, or deoderant.  Do not shave 48 hours prior to surgery.  Men may shave face and neck.  Do not bring valuables to the hospital.  [facility] is not responsible for any belongings or valuables.  Contacts, dentures or bridgework may not be worn into surgery.  Leave your suitcase in the car.  After surgery it may be brought to your room.  For patients admitted to the hospital, discharge time will be determined by your treatment team.  Patients discharged the day of surgery will not be allowed to drive home.   Name and phone number of your driver:   family Special instructions:  Follow the diet and prep instructions given to you by Dr Roseanne Kaufman office.  Please read over the following fact sheets that you were given. Anesthesia Post-op Instructions and Care and Recovery After Surgery       Colonoscopy, Adult A colonoscopy is an exam to look at the entire large intestine. During the exam, a lubricated, bendable tube is inserted into the anus and then passed into the rectum, colon, and other parts of the large intestine. A colonoscopy is often done as a part of normal colorectal screening or in response to certain symptoms, such as anemia, persistent diarrhea, abdominal pain, and blood in the stool. The exam can help screen for and diagnose medical problems, including:  Tumors.  Polyps.  Inflammation.  Areas of bleeding.  Tell a health care  provider about:  Any allergies you have.  All medicines you are taking, including vitamins, herbs, eye drops, creams, and over-the-counter medicines.  Any problems you or family members have had with anesthetic medicines.  Any blood disorders you have.  Any surgeries you have had.  Any medical conditions you have.  Any problems you have had passing stool. What are the risks? Generally, this is a safe procedure. However, problems may occur, including:  Bleeding.  A tear in the intestine.  A reaction to medicines given during the exam.  Infection (rare).  What happens before the procedure? Eating and drinking restrictions Follow instructions from your health care provider about eating and drinking, which may include:  A few days before the procedure - follow a low-fiber diet. Avoid nuts, seeds, dried fruit, raw fruits, and vegetables.  1-3 days before the procedure - follow a clear liquid diet. Drink only clear liquids, such as clear broth or bouillon, black coffee or tea, clear juice, clear soft drinks or sports drinks, gelatin dessert, and popsicles. Avoid any liquids that contain red or purple dye.  On the day of the procedure - do not eat or drink anything during the 2 hours before the procedure, or within the time period that your health care provider recommends.  Bowel prep If you were prescribed an oral bowel prep to clean out your colon:  Take it as told by your health  care provider. Starting the day before your procedure, you will need to drink a large amount of medicated liquid. The liquid will cause you to have multiple loose stools until your stool is almost clear or light green.  If your skin or anus gets irritated from diarrhea, you may use these to relieve the irritation: ? Medicated wipes, such as adult wet wipes with aloe and vitamin E. ? A skin soothing-product like petroleum jelly.  If you vomit while drinking the bowel prep, take a break for up to 60  minutes and then begin the bowel prep again. If vomiting continues and you cannot take the bowel prep without vomiting, call your health care provider.  General instructions  Ask your health care provider about changing or stopping your regular medicines. This is especially important if you are taking diabetes medicines or blood thinners.  Plan to have someone take you home from the hospital or clinic. What happens during the procedure?  An IV tube may be inserted into one of your veins.  You will be given medicine to help you relax (sedative).  To reduce your risk of infection: ? Your health care team will wash or sanitize their hands. ? Your anal area will be washed with soap.  You will be asked to lie on your side with your knees bent.  Your health care provider will lubricate a long, thin, flexible tube. The tube will have a camera and a light on the end.  The tube will be inserted into your anus.  The tube will be gently eased through your rectum and colon.  Air will be delivered into your colon to keep it open. You may feel some pressure or cramping.  The camera will be used to take images during the procedure.  A small tissue sample may be removed from your body to be examined under a microscope (biopsy). If any potential problems are found, the tissue will be sent to a lab for testing.  If small polyps are found, your health care provider may remove them and have them checked for cancer cells.  The tube that was inserted into your anus will be slowly removed. The procedure may vary among health care providers and hospitals. What happens after the procedure?  Your blood pressure, heart rate, breathing rate, and blood oxygen level will be monitored until the medicines you were given have worn off.  Do not drive for 24 hours after the exam.  You may have a small amount of blood in your stool.  You may pass gas and have mild abdominal cramping or bloating due to the air  that was used to inflate your colon during the exam.  It is up to you to get the results of your procedure. Ask your health care provider, or the department performing the procedure, when your results will be ready. This information is not intended to replace advice given to you by your health care provider. Make sure you discuss any questions you have with your health care provider. Document Released: 09/11/2000 Document Revised: 07/15/2016 Document Reviewed: 11/26/2015 Elsevier Interactive Patient Education  2018 [employer].  Colonoscopy, Adult, Care After This sheet gives you information about how to care for yourself after your procedure. Your health care provider may also give you more specific instructions. If you have problems or questions, contact your health care provider. What can I expect after the procedure? After the procedure, it is common to have:  A small amount of blood in your  stool for 24 hours after the procedure.  Some gas.  Mild abdominal cramping or bloating.  Follow these instructions at home: General instructions   For the first 24 hours after the procedure: ? Do not drive or use machinery. ? Do not sign important documents. ? Do not drink alcohol. ? Do your regular daily activities at a slower pace than normal. ? Eat soft, easy-to-digest foods. ? Rest often.  Take over-the-counter or prescription medicines only as told by your health care provider.  It is up to you to get the results of your procedure. Ask your health care provider, or the department performing the procedure, when your results will be ready. Relieving cramping and bloating  Try walking around when you have cramps or feel bloated.  Apply heat to your abdomen as told by your health care provider. Use a heat source that your health care provider recommends, such as a moist heat pack or a heating pad. ? Place a towel between your skin and the heat source. ? Leave the heat on for 20-30  minutes. ? Remove the heat if your skin turns bright red. This is especially important if you are unable to feel pain, heat, or cold. You may have a greater risk of getting burned. Eating and drinking  Drink enough fluid to keep your urine clear or pale yellow.  Resume your normal diet as instructed by your health care provider. Avoid heavy or fried foods that are hard to digest.  Avoid drinking alcohol for as long as instructed by your health care provider. Contact a health care provider if:  You have blood in your stool 2-3 days after the procedure. Get help right away if:  You have more than a small spotting of blood in your stool.  You pass large blood clots in your stool.  Your abdomen is swollen.  You have nausea or vomiting.  You have a fever.  You have increasing abdominal pain that is not relieved with medicine. This information is not intended to replace advice given to you by your health care provider. Make sure you discuss any questions you have with your health care provider. Document Released: 04/28/2004 Document Revised: 06/08/2016 Document Reviewed: 11/26/2015 Elsevier Interactive Patient Education  2018 [employer] Anesthesia is a term that refers to techniques, procedures, and medicines that help a person stay safe and comfortable during a medical procedure. Monitored anesthesia care, or sedation, is one type of anesthesia. Your anesthesia specialist may recommend sedation if you will be having a procedure that does not require you to be unconscious, such as:  Cataract surgery.  A dental procedure.  A biopsy.  A colonoscopy.  During the procedure, you may receive a medicine to help you relax (sedative). There are three levels of sedation:  Mild sedation. At this level, you may feel awake and relaxed. You will be able to follow directions.  Moderate sedation. At this level, you will be sleepy. You may not remember the  procedure.  Deep sedation. At this level, you will be asleep. You will not remember the procedure.  The more medicine you are given, the deeper your level of sedation will be. Depending on how you respond to the procedure, the anesthesia specialist may change your level of sedation or the type of anesthesia to fit your needs. An anesthesia specialist will monitor you closely during the procedure. Let your health care provider know about:  Any allergies you have.  All medicines you are  taking, including vitamins, herbs, eye drops, creams, and over-the-counter medicines.  Any use of steroids (by mouth or as a cream).  Any problems you or family members have had with sedatives and anesthetic medicines.  Any blood disorders you have.  Any surgeries you have had.  Any medical conditions you have, such as sleep apnea.  Whether you are pregnant or may be pregnant.  Any use of cigarettes, alcohol, or street drugs. What are the risks? Generally, this is a safe procedure. However, problems may occur, including:  Getting too much medicine (oversedation).  Nausea.  Allergic reaction to medicines.  Trouble breathing. If this happens, a breathing tube may be used to help with breathing. It will be removed when you are awake and breathing on your own.  Heart trouble.  Lung trouble.  Before the procedure Staying hydrated Follow instructions from your health care provider about hydration, which may include:  Up to 2 hours before the procedure - you may continue to drink clear liquids, such as water, clear fruit juice, black coffee, and plain tea.  Eating and drinking restrictions Follow instructions from your health care provider about eating and drinking, which may include:  8 hours before the procedure - stop eating heavy meals or foods such as meat, fried foods, or fatty foods.  6 hours before the procedure - stop eating light meals or foods, such as toast or cereal.  6 hours  before the procedure - stop drinking milk or drinks that contain milk.  2 hours before the procedure - stop drinking clear liquids.  Medicines Ask your health care provider about:  Changing or stopping your regular medicines. This is especially important if you are taking diabetes medicines or blood thinners.  Taking medicines such as aspirin and ibuprofen. These medicines can thin your blood. Do not take these medicines before your procedure if your health care provider instructs you not to.  Tests and exams  You will have a physical exam.  You may have blood tests done to show: ? How well your kidneys and liver are working. ? How well your blood can clot.  General instructions  Plan to have someone take you home from the hospital or clinic.  If you will be going home right after the procedure, plan to have someone with you for 24 hours.  What happens during the procedure?  Your blood pressure, heart rate, breathing, level of pain and overall condition will be monitored.  An IV tube will be inserted into one of your veins.  Your anesthesia specialist will give you medicines as needed to keep you comfortable during the procedure. This may mean changing the level of sedation.  The procedure will be performed. After the procedure  Your blood pressure, heart rate, breathing rate, and blood oxygen level will be monitored until the medicines you were given have worn off.  Do not drive for 24 hours if you received a sedative.  You may: ? Feel sleepy, clumsy, or nauseous. ? Feel forgetful about what happened after the procedure. ? Have a sore throat if you had a breathing tube during the procedure. ? Vomit. This information is not intended to replace advice given to you by your health care provider. Make sure you discuss any questions you have with your health care provider. Document Released: 06/10/2005 Document Revised: 02/21/2016 Document Reviewed: 01/05/2016 Elsevier  Interactive Patient Education  2018 [place], Care After These instructions provide you with information about caring for yourself after  your procedure. Your health care provider may also give you more specific instructions. Your treatment has been planned according to current medical practices, but problems sometimes occur. Call your health care provider if you have any problems or questions after your procedure. What can I expect after the procedure? After your procedure, it is common to:  Feel sleepy for several hours.  Feel clumsy and have poor balance for several hours.  Feel forgetful about what happened after the procedure.  Have poor judgment for several hours.  Feel nauseous or vomit.  Have a sore throat if you had a breathing tube during the procedure.  Follow these instructions at home: For at least 24 hours after the procedure:   Do not: ? Participate in activities in which you could fall or become injured. ? Drive. ? Use heavy machinery. ? Drink alcohol. ? Take sleeping pills or medicines that cause drowsiness. ? Make important decisions or sign legal documents. ? Take care of children on your own.  Rest. Eating and drinking  Follow the diet that is recommended by your health care provider.  If you vomit, drink water, juice, or soup when you can drink without vomiting.  Make sure you have little or no nausea before eating solid foods. General instructions  Have a responsible adult stay with you until you are awake and alert.  Take over-the-counter and prescription medicines only as told by your health care provider.  If you smoke, do not smoke without supervision.  Keep all follow-up visits as told by your health care provider. This is important. Contact a health care provider if:  You keep feeling nauseous or you keep vomiting.  You feel light-headed.  You develop a rash.  You have a fever. Get help right away  if:  You have trouble breathing. This information is not intended to replace advice given to you by your health care provider. Make sure you discuss any questions you have with your health care provider. Document Released: 01/05/2016 Document Revised: 05/06/2016 Document Reviewed: 01/05/2016 Elsevier Interactive Patient Education  [employer].

## 2017-04-13 ENCOUNTER — Other Ambulatory Visit: Payer: Self-pay | Admitting: Nurse Practitioner

## 2017-04-13 ENCOUNTER — Encounter (HOSPITAL_COMMUNITY)
Admission: RE | Admit: 2017-04-13 | Discharge: 2017-04-13 | Disposition: A | Discharge status: Home / Self Care | Payer: Medicare Other | Source: Ambulatory Visit | Attending: Internal Medicine | Admitting: Internal Medicine

## 2017-04-13 ENCOUNTER — Encounter (HOSPITAL_COMMUNITY): Payer: Self-pay

## 2017-04-13 ENCOUNTER — Ambulatory Visit (HOSPITAL_COMMUNITY)
Admission: RE | Admit: 2017-04-13 | Discharge: 2017-04-16 | Disposition: A | Discharge status: Home / Self Care | Payer: Medicare Other | Source: Ambulatory Visit | Attending: Internal Medicine | Admitting: Internal Medicine

## 2017-04-13 DIAGNOSIS — G8929 Other chronic pain: Secondary | ICD-10-CM | POA: Diagnosis not present

## 2017-04-13 DIAGNOSIS — F329 Major depressive disorder, single episode, unspecified: Secondary | ICD-10-CM | POA: Diagnosis not present

## 2017-04-13 DIAGNOSIS — K921 Melena: Secondary | ICD-10-CM | POA: Insufficient documentation

## 2017-04-13 DIAGNOSIS — I2510 Atherosclerotic heart disease of native coronary artery without angina pectoris: Secondary | ICD-10-CM | POA: Diagnosis not present

## 2017-04-13 DIAGNOSIS — R634 Abnormal weight loss: Secondary | ICD-10-CM | POA: Diagnosis not present

## 2017-04-13 DIAGNOSIS — E785 Hyperlipidemia, unspecified: Secondary | ICD-10-CM | POA: Insufficient documentation

## 2017-04-13 DIAGNOSIS — Z8673 Personal history of transient ischemic attack (TIA), and cerebral infarction without residual deficits: Secondary | ICD-10-CM | POA: Diagnosis not present

## 2017-04-13 DIAGNOSIS — R109 Unspecified abdominal pain: Secondary | ICD-10-CM

## 2017-04-13 DIAGNOSIS — F17210 Nicotine dependence, cigarettes, uncomplicated: Secondary | ICD-10-CM | POA: Insufficient documentation

## 2017-04-13 DIAGNOSIS — F419 Anxiety disorder, unspecified: Secondary | ICD-10-CM | POA: Diagnosis not present

## 2017-04-13 DIAGNOSIS — R194 Change in bowel habit: Secondary | ICD-10-CM | POA: Diagnosis present

## 2017-04-13 DIAGNOSIS — Z8541 Personal history of malignant neoplasm of cervix uteri: Secondary | ICD-10-CM | POA: Insufficient documentation

## 2017-04-13 DIAGNOSIS — Z79899 Other long term (current) drug therapy: Secondary | ICD-10-CM | POA: Insufficient documentation

## 2017-04-13 DIAGNOSIS — K219 Gastro-esophageal reflux disease without esophagitis: Secondary | ICD-10-CM | POA: Diagnosis not present

## 2017-04-13 DIAGNOSIS — K5730 Diverticulosis of large intestine without perforation or abscess without bleeding: Secondary | ICD-10-CM | POA: Insufficient documentation

## 2017-04-13 DIAGNOSIS — K640 First degree hemorrhoids: Secondary | ICD-10-CM | POA: Diagnosis not present

## 2017-04-13 DIAGNOSIS — J449 Chronic obstructive pulmonary disease, unspecified: Secondary | ICD-10-CM | POA: Insufficient documentation

## 2017-04-13 DIAGNOSIS — D123 Benign neoplasm of transverse colon: Secondary | ICD-10-CM | POA: Diagnosis not present

## 2017-04-13 HISTORY — DX: Personal history of urinary calculi: Z87.442

## 2017-04-13 LAB — BASIC METABOLIC PANEL
ANION GAP: 9 (ref 5–15)
BUN: 15 mg/dL (ref 6–20)
CALCIUM: 9.3 mg/dL (ref 8.9–10.3)
CHLORIDE: 97 mmol/L — AB (ref 101–111)
CO2: 31 mmol/L (ref 22–32)
Creatinine, Ser: 0.53 mg/dL (ref 0.44–1.00)
GFR calc Af Amer: >60 mL/min (ref >60)
GFR calc non Af Amer: >60 mL/min (ref >60)
GLUCOSE: 86 mg/dL (ref 65–99)
Potassium: 3.4 mmol/L — ABNORMAL LOW (ref 3.5–5.1)
Sodium: 137 mmol/L (ref 135–145)

## 2017-04-13 LAB — CBC WITH DIFFERENTIAL/PLATELET
BASOS ABS: 0 10*3/uL (ref 0.0–0.1)
Basophils Relative: 0 %
Eosinophils Absolute: 0.1 10*3/uL (ref 0.0–0.7)
Eosinophils Relative: 1 %
HEMATOCRIT: 36.8 % (ref 36.0–46.0)
HEMOGLOBIN: 12.9 g/dL (ref 12.0–15.0)
LYMPHS PCT: 36 %
Lymphs Abs: 2.7 10*3/uL (ref 0.7–4.0)
MCH: 32 pg (ref 26.0–34.0)
MCHC: 35.1 g/dL (ref 30.0–36.0)
MCV: 91.3 fL (ref 78.0–100.0)
MONO ABS: 0.6 10*3/uL (ref 0.1–1.0)
Monocytes Relative: 8 %
NEUTROS ABS: 4.1 10*3/uL (ref 1.7–7.7)
Neutrophils Relative %: 55 %
Platelets: 227 10*3/uL (ref 150–400)
RBC: 4.03 MIL/uL (ref 3.87–5.11)
RDW: 12.9 % (ref 11.5–15.5)
WBC: 7.5 10*3/uL (ref 4.0–10.5)

## 2017-04-13 MED ORDER — POTASSIUM CHLORIDE ER 20 MEQ PO TBCR: 20.0000 meq | EXTENDED_RELEASE_TABLET | Freq: Every day | ORAL | 0 refills | Status: DC

## 2017-04-13 NOTE — Progress Notes (Signed)
LMOM to call.

## 2017-04-13 NOTE — Progress Notes (Signed)
PT is aware that we will let Dr. Karie Kirks know. Forwarding to Manuela Schwartz to get copy to him.

## 2017-04-14 NOTE — Progress Notes (Signed)
Pt is aware.  

## 2017-04-15 ENCOUNTER — Ambulatory Visit (HOSPITAL_COMMUNITY): Payer: Medicare Other | Admitting: Anesthesiology

## 2017-04-15 ENCOUNTER — Encounter (HOSPITAL_COMMUNITY)
Admission: RE | Disposition: A | Discharge status: Home / Self Care | Payer: Self-pay | Source: Ambulatory Visit | Attending: Internal Medicine

## 2017-04-15 ENCOUNTER — Encounter (HOSPITAL_COMMUNITY): Payer: Self-pay | Admitting: *Deleted

## 2017-04-15 ENCOUNTER — Other Ambulatory Visit: Payer: Self-pay

## 2017-04-15 DIAGNOSIS — R194 Change in bowel habit: Secondary | ICD-10-CM

## 2017-04-15 DIAGNOSIS — K640 First degree hemorrhoids: Secondary | ICD-10-CM

## 2017-04-15 DIAGNOSIS — R634 Abnormal weight loss: Secondary | ICD-10-CM | POA: Diagnosis not present

## 2017-04-15 DIAGNOSIS — R109 Unspecified abdominal pain: Secondary | ICD-10-CM

## 2017-04-15 DIAGNOSIS — K921 Melena: Secondary | ICD-10-CM

## 2017-04-15 DIAGNOSIS — K5730 Diverticulosis of large intestine without perforation or abscess without bleeding: Secondary | ICD-10-CM

## 2017-04-15 DIAGNOSIS — D123 Benign neoplasm of transverse colon: Secondary | ICD-10-CM | POA: Diagnosis not present

## 2017-04-15 HISTORY — PX: COLONOSCOPY WITH PROPOFOL: SHX5780

## 2017-04-15 SURGERY — COLONOSCOPY WITH PROPOFOL
Anesthesia: Monitor Anesthesia Care

## 2017-04-15 MED ORDER — FENTANYL CITRATE (PF) 100 MCG/2ML IJ SOLN
25.0000 ug | Freq: Once | INTRAMUSCULAR | Status: AC
  Administered 2017-04-15: 25 ug via INTRAVENOUS

## 2017-04-15 MED ORDER — MIDAZOLAM HCL 2 MG/2ML IJ SOLN
1.0000 mg | INTRAMUSCULAR | Status: AC
  Administered 2017-04-15 (×2): 1 mg via INTRAVENOUS

## 2017-04-15 MED ORDER — LACTATED RINGERS IV SOLN
INTRAVENOUS | Status: DC
  Administered 2017-04-15: 1000 mL via INTRAVENOUS

## 2017-04-15 MED ORDER — FENTANYL CITRATE (PF) 100 MCG/2ML IJ SOLN
INTRAMUSCULAR | Status: AC
  Filled 2017-04-15: qty 2

## 2017-04-15 MED ORDER — CHLORHEXIDINE GLUCONATE CLOTH 2 % EX PADS: 6.0000 | MEDICATED_PAD | Freq: Once | CUTANEOUS | Status: DC

## 2017-04-15 MED ORDER — MIDAZOLAM HCL 2 MG/2ML IJ SOLN
INTRAMUSCULAR | Status: AC
  Filled 2017-04-15: qty 2

## 2017-04-15 MED ORDER — MIDAZOLAM HCL 5 MG/5ML IJ SOLN
INTRAMUSCULAR | Status: DC | PRN
  Administered 2017-04-15 (×2): 1 mg via INTRAVENOUS

## 2017-04-15 MED ORDER — GLYCOPYRROLATE 0.2 MG/ML IJ SOLN
INTRAMUSCULAR | Status: AC
  Filled 2017-04-15: qty 1

## 2017-04-15 MED ORDER — PROPOFOL 500 MG/50ML IV EMUL
INTRAVENOUS | Status: DC | PRN
  Administered 2017-04-15: 50 ug/kg/min via INTRAVENOUS

## 2017-04-15 MED ORDER — GLYCOPYRROLATE 0.2 MG/ML IJ SOLN
0.2000 mg | Freq: Once | INTRAMUSCULAR | Status: AC | PRN
  Administered 2017-04-15: 0.1 mg via INTRAVENOUS

## 2017-04-15 MED ORDER — EPHEDRINE SULFATE 50 MG/ML IJ SOLN
INTRAMUSCULAR | Status: DC | PRN
  Administered 2017-04-15: 10 mg via INTRAVENOUS

## 2017-04-15 NOTE — Transfer of Care (Addendum)
Immediate Anesthesia Transfer of Care Note Late Entry for 1359  Patient: [name]  Procedure(s) Performed: Procedure(s) with comments: COLONOSCOPY WITH PROPOFOL (N/A) - 11:15am  Patient Location: PACU  Anesthesia Type:MAC  Level of Consciousness: awake, alert , oriented and patient cooperative  Airway & Oxygen Therapy: Patient Spontanous Breathing and Patient connected to nasal cannula oxygen  Post-op Assessment: Report given to RN and Post -op Vital signs reviewed and stable  Post vital signs: Reviewed and stable  Last Vitals:  Vitals:   04/15/17 1359 04/15/17 1400  BP: (!) 90/51 (!) 90/51  Pulse: 80 81  Resp: (!) 8 (!) 28  Temp: 36.9 C     Last Pain:  Vitals:   04/15/17 1115  TempSrc: Oral  PainSc: 5       Patients Stated Pain Goal: 8 (76/54/65 0354)  Complications: No apparent anesthesia complications

## 2017-04-15 NOTE — Anesthesia Preprocedure Evaluation (Signed)
Anesthesia Evaluation  Patient identified by MRN, date of birth, ID band Patient awake    Reviewed: Allergy & Precautions, H&P , NPO status , Patient's Chart, lab work & pertinent test results  Airway Mallampati: II       Dental  (+) Edentulous Upper, Edentulous Lower   Pulmonary COPD ( chronic cough), Current Smoker,    + rhonchi ( clear with cough)        Cardiovascular + CAD  + dysrhythmias (palpitations)  Rhythm:Regular Rate:Normal     Neuro/Psych PSYCHIATRIC DISORDERS Anxiety Depression CVA, Residual Symptoms    GI/Hepatic GERD  Medicated and Controlled,Hx pancreatitis    Endo/Other    Renal/GU      Musculoskeletal   Abdominal   Peds  Hematology   Anesthesia Other Findings   Reproductive/Obstetrics                             Anesthesia Physical Anesthesia Plan  ASA: III  Anesthesia Plan: MAC   Post-op Pain Management:    Induction: Intravenous  PONV Risk Score and Plan:   Airway Management Planned: Simple Face Mask  Additional Equipment:   Intra-op Plan:   Post-operative Plan:   Informed Consent: I have reviewed the patients History and Physical, chart, labs and discussed the procedure including the risks, benefits and alternatives for the proposed anesthesia with the patient or authorized representative who has indicated his/her understanding and acceptance.     Plan Discussed with:   Anesthesia Plan Comments:         Anesthesia Quick Evaluation

## 2017-04-15 NOTE — Op Note (Signed)
[facility] Patient Name: [name] Procedure Date: 04/15/2017 12:26 PM MRN: [id number] Date of Birth: 07-Oct-1945 Attending MD: Norvel Richards , MD CSN: [id number] Age: 71 Admit Type: Outpatient Procedure:                Colonoscopy Indications:              Hematochezia, Change in bowel habits, Weight loss Providers:                Norvel Richards, MD, Lurline Del, RN, Randa Spike, Technician Referring MD:              Medicines:                Propofol per Anesthesia Complications:            No immediate complications. Estimated Blood Loss:     none Procedure:                Pre-Anesthesia Assessment:                           - Prior to the procedure, a History and Physical                            was performed, and patient medications and                            allergies were reviewed. The patient's tolerance of                            previous anesthesia was also reviewed. The risks                            and benefits of the procedure and the sedation                            options and risks were discussed with the patient.                            All questions were answered, and informed consent                            was obtained. Prior Anticoagulants: The patient has                            taken no previous anticoagulant or antiplatelet                            agents. ASA Grade Assessment: II - A patient with                            mild systemic disease. After reviewing the risks  and benefits, the patient was deemed in                            satisfactory condition to undergo the procedure.                           After obtaining informed consent, the colonoscope                            was passed under direct vision. Throughout the                            procedure, the patient's blood pressure, pulse, and                            oxygen  saturations were monitored continuously.The                            ileocecal valve, appendiceal orifice, and rectum                            were photographed. The entire colon was well                            visualized. The colonoscopy was performed without                            difficulty. The quality of the bowel preparation                            was adequate. The EC-[id number] ([id number]) scope was                            introduced through the anus and advanced to the the                            cecum, identified by appendiceal orifice and                            ileocecal valve. Scope In: 1:36:02 PM Scope Out: 1:49:24 PM Scope Withdrawal Time: 0 hours 7 minutes 56 seconds  Total Procedure Duration: 0 hours 13 minutes 22 seconds  Findings:      The perianal and digital rectal examinations were normal. The distal 5       cm of terminal ileal mucosa also appeared normal.      The colon (entire examined portion) appeared normal.      No additional abnormalities were found on retroflexion. Impression:               - The entire examined colon is normal.                           - No specimens collected. Moderate Sedation:      Moderate (conscious) sedation was personally administered by an       anesthesia professional. The following parameters  were monitored: oxygen       saturation, heart rate, blood pressure, respiratory rate, EKG, adequacy       of pulmonary ventilation, and response to care. Total physician       intraservice time was 23 minutes. Recommendation:           - Patient has a contact number available for                            emergencies. The signs and symptoms of potential                            delayed complications were discussed with the                            patient. Return to normal activities tomorrow.                            Written discharge instructions were provided to the                            patient.                            - Resume previous diet.                           - Continue present medications. increase Linzess to                            145 daily. CTA abdomen and pelvis to reassess                            mesenteric vasculature                           - Repeat colonoscopy in 5 years. see                           - Return to GI clinic in 6 weeks Procedure Code(s):        --- Professional ---                           [phone], Colonoscopy, flexible; diagnostic, including                            collection of specimen(s) by brushing or washing,                            when performed (separate procedure) Diagnosis Code(s):        --- Professional ---                           D12.3, Benign neoplasm of transverse colon (hepatic  flexure or splenic flexure)                           K64.0, First degree hemorrhoids                           K92.1, Melena (includes Hematochezia)                           R19.4, Change in bowel habit                           R63.4, Abnormal weight loss                           K57.30, Diverticulosis of large intestine without                            perforation or abscess without bleeding CPT copyright 2016 American Medical Association. All rights reserved. The codes documented in this report are preliminary and upon coder review may  be revised to meet current compliance requirements. Cristopher Estimable. Evey Mcmahan, MD Norvel Richards, MD 04/15/2017 1:58:44 PM This report has been signed electronically. Number of Addenda: 0

## 2017-04-15 NOTE — Discharge Instructions (Signed)
°  Colonoscopy Discharge Instructions  Read the instructions outlined below and refer to this sheet in the next few weeks. These discharge instructions provide you with general information on caring for yourself after you leave the hospital. Your doctor may also give you specific instructions. While your treatment has been planned according to the most current medical practices available, unavoidable complications occasionally occur. If you have any problems or questions after discharge, call Dr. Gala Romney at [phone]. ACTIVITY  You may resume your regular activity, but move at a slower pace for the next 24 hours.   Take frequent rest periods for the next 24 hours.   Walking will help get rid of the air and reduce the bloated feeling in your belly (abdomen).   No driving for 24 hours (because of the medicine (anesthesia) used during the test).    Do not sign any important legal documents or operate any machinery for 24 hours (because of the anesthesia used during the test).  NUTRITION  Drink plenty of fluids.   You may resume your normal diet as instructed by your doctor.   Begin with a light meal and progress to your normal diet. Heavy or fried foods are harder to digest and may make you feel sick to your stomach (nauseated).   Avoid alcoholic beverages for 24 hours or as instructed.  MEDICATIONS  You may resume your normal medications unless your doctor tells you otherwise.  WHAT YOU CAN EXPECT TODAY  Some feelings of bloating in the abdomen.   Passage of more gas than usual.   Spotting of blood in your stool or on the toilet paper.  IF YOU HAD POLYPS REMOVED DURING THE COLONOSCOPY:  No aspirin products for 7 days or as instructed.   No alcohol for 7 days or as instructed.   Eat a soft diet for the next 24 hours.  FINDING OUT THE RESULTS OF YOUR TEST Not all test results are available during your visit. If your test results are not back during the visit, make an appointment  with your caregiver to find out the results. Do not assume everything is normal if you have not heard from your caregiver or the medical facility. It is important for you to follow up on all of your test results.  SEEK IMMEDIATE MEDICAL ATTENTION IF:  You have more than a spotting of blood in your stool.   Your belly is swollen (abdominal distention).   You are nauseated or vomiting.   You have a temperature over 101.   You have abdominal pain or discomfort that is severe or gets worse throughout the day.     Increase Linzess 145 daily  Schedule CT angiogram abdomen and pelvis to further evaluate mesenteric vasculature given abdominal pain and weight loss  Office visit with [place] in 6 weeks

## 2017-04-15 NOTE — Anesthesia Postprocedure Evaluation (Signed)
Anesthesia Post Note  Patient: [name]  Procedure(s) Performed: Procedure(s) (LRB): COLONOSCOPY WITH PROPOFOL (N/A)  Patient location during evaluation: PACU Anesthesia Type: MAC Level of consciousness: awake and alert, oriented and patient cooperative Pain management: pain level controlled Vital Signs Assessment: post-procedure vital signs reviewed and stable Respiratory status: spontaneous breathing Cardiovascular status: stable Postop Assessment: no signs of nausea or vomiting Anesthetic complications: no     Last Vitals:  Vitals:   04/15/17 1359 04/15/17 1400  BP: (!) 90/51 (!) 90/51  Pulse: 80 81  Resp: (!) 8 (!) 28  Temp: 36.9 C     Last Pain:  Vitals:   04/15/17 1115  TempSrc: Oral  PainSc: 5                  ADAMS, AMY A

## 2017-04-15 NOTE — H&P (Signed)
@LOGO @   Primary Care Physician:  Lemmie Evens, MD Primary Gastroenterologist:  Dr. Gala Romney  Pre-Procedure History & Physical: HPI:  [name] is a 71 y.o. female here for colonoscopy for weight loss, abdominal pain and change in bowel habits.  Past Medical History:  Diagnosis Date  . Allergic rhinitis   . Anxiety   . Anxiety and depression   . Back pain, chronic   . CAD (coronary artery disease)    palpitations, dizziness, chest pain  . Cervical cancer (HCC)    cervical  . Chronic abdominal pain   . COPD (chronic obstructive pulmonary disease) ([place])   . Depression   . GERD (gastroesophageal reflux disease)   . Helicobacter pylori infection 2015   treated with prevpac  . Hematochezia   . History of kidney stones   . Hx of colonic polyps    adenomatous  . Hyperlipidemia    Lipid profile on 02/25/2011: 209, 113, 55, 132  . Nephrolithiasis   . Pancreatitis chronic   . Renal calculus   . Stroke [facility]) 3-4 yrs ago   left sided weakness  . Tobacco abuse   . Tubular adenoma 2015  . Weight loss    CT negative for occult malignancy, negative celiac, negative adrenal insufficiency    Past Surgical History:  Procedure Laterality Date  . ABDOMINAL HYSTERECTOMY    . APPENDECTOMY    . CATARACT EXTRACTION W/PHACO Right 05/04/2013   Procedure: CATARACT EXTRACTION PHACO AND INTRAOCULAR LENS PLACEMENT (IOC);  Surgeon: Tonny Branch, MD;  Location: [facility];  Service: Ophthalmology;  Laterality: Right;  CDE:16.17  . CATARACT EXTRACTION W/PHACO Left 05/22/2013   Procedure: CATARACT EXTRACTION PHACO AND INTRAOCULAR LENS PLACEMENT (IOC);  Surgeon: Tonny Branch, MD;  Location: [facility];  Service: Ophthalmology;  Laterality: Left;  CDE:14.75  . COLONOSCOPY N/A 02/08/2014   Dr.Brantley Wiley- normal rectum, colonic polyps bx=tubular adenoma  . COLONOSCOPY W/ POLYPECTOMY  2006   DZH:[id number], benign gastric nodule, multiple adenomatous polyps, one with tubular morphology  . ESOPHAGOGASTRODUODENOSCOPY   2006   DQQ:IWLNLG gastric nodule  . ESOPHAGOGASTRODUODENOSCOPY N/A 02/08/2014   Dr.Luise Yamamoto- abnormal stomach and gstric nodule bx= hpylori  . EUS  2010   Dr. Ardis Hughs : Multiple shadowing calcifications in pancreas, consistent with Chronic Pancreatitis.  These are mainly confined to a collection of calcifications in head/uncinate pancreas. Otherwise the pancreatic parenchyma appears fairly normal.  The main pancreatic duct, CBD are both normal without stones, dilation.    . Ileocolonoscopy  01/11/2009   RMR: Polyp at the splenic flexure, status post hot snare removal/ Normal rectum, tubular adenoma  . PARTIAL HYSTERECTOMY      Prior to Admission medications   Medication Sig Start Date End Date Taking? Authorizing Provider  ADVAIR DISKUS 250-50 MCG/DOSE AEPB Inhale 1 puff into the lungs 2 (two) times daily.  10/05/12  Yes [provider]  Calcium Carbonate (CALCIUM 600 PO) Take 1 tablet by mouth daily.     Yes [provider]  cilostazol (PLETAL) 100 MG tablet Take 100 mg by mouth 2 (two) times daily.  03/29/15  Yes [provider]  CREON 24000-76000 units CPEP TAKE 2 CAPSULES WITH EACH MEALS AND 1 CAPSULE WITH SNACKS. 11/30/16  Yes Carlis Stable, NP  dronabinol (MARINOL) 2.5 MG capsule Take 2.5 mg by mouth daily.   Yes [provider]  DULoxetine (CYMBALTA) 60 MG capsule Take 60 mg by mouth 2 (two) times daily.    Yes [provider]  escitalopram (LEXAPRO) 10  MG tablet Take 10 mg by mouth daily. 01/30/17  Yes [provider]  hydrOXYzine (ATARAX/VISTARIL) 25 MG tablet Take 1 tablet by mouth 3 (three) times daily as needed for anxiety or itching.  04/21/16  Yes [provider]  ipratropium (ATROVENT) 0.06 % nasal spray Place 2 sprays into both nostrils 2 (two) times daily as needed for rhinitis.   Yes [provider]  linaclotide Rolan Lipa) 72 MCG capsule Take 1 capsule (72 mcg total) by mouth daily before breakfast. 12/01/16  Yes Carlis Stable, NP  LORazepam (ATIVAN) 1 MG tablet Take 1 mg by mouth every 8 (eight) hours as needed for anxiety. Reported on 11/19/2015   Yes [provider]  omeprazole (PRILOSEC) 20 MG capsule TAKE 1 CAPSULE BY MOUTH ONCE DAILY. 06/10/16  Yes Mahala Menghini, PA-C  oxyCODONE-acetaminophen (PERCOCET/ROXICET) 5-325 MG tablet Take 1 tablet by mouth every 6 (six) hours as needed for severe pain.   Yes [provider]  polyethylene glycol-electrolytes (TRILYTE) 420 g solution Take 4,000 mLs by mouth as directed. 03/08/17  Yes Earlyne Feeser, Cristopher Estimable, MD  potassium chloride 20 MEQ TBCR Take 20 mEq by mouth daily. 04/13/17 04/15/17 Yes Carlis Stable, NP  PROAIR HFA 108 [phone] Base) MCG/ACT inhaler Inhale 2 puffs into the lungs every 4 (four) hours as needed for shortness of breath or wheezing. 01/30/17  Yes [provider]  promethazine (PHENERGAN) 25 MG tablet Take 25 mg by mouth every 6 (six) hours as needed for nausea or vomiting.    Yes [provider]  RESTASIS 0.05 % ophthalmic emulsion Place 1 drop into both eyes 2 (two) times daily. 03/27/16  Yes [provider]  acetaminophen (TYLENOL) 325 MG tablet Take 650 mg by mouth every 6 (six) hours as needed for mild pain.    [provider]  doxycycline (VIBRAMYCIN) 100 MG capsule Take 1 capsule (100 mg total) by mouth 2 (two) times daily. Patient not taking: Reported on 04/09/2017 01/22/17   Lily Kocher, PA-C  nitroGLYCERIN (NITROSTAT) 0.4 MG SL tablet Place 1 tablet (0.4 mg total) under the tongue every 5 (five) minutes as needed. 11/21/14   Herminio Commons, MD    Allergies as of 03/08/2017 - Review Complete 03/08/2017  Allergen Reaction Noted  . Ibuprofen Anaphylaxis and Hives 12/19/2008  . Iohexol Hives and Swelling 05/23/2004  . Strawberry (diagnostic) Itching 11/09/2015    Family History  Problem Relation Age of Onset  . Heart attack Father 47  . Colon cancer Maternal Grandmother     Social History    Social History  . Marital status: Divorced    Spouse name: N/A  . Number of children: 3  . Years of education: N/A   Occupational History  . disabled Unemployed   Social History Main Topics  . Smoking status: Current Every Day Smoker    Packs/day: 0.50    Years: 50.00    Types: Cigarettes    Start date: 07/18/1962  . Smokeless tobacco: Never Used     Comment: 3/4 pack daily  . Alcohol use No     Comment: hx of ETOH about 20 years ago.   . Drug use: No  . Sexual activity: No   Other Topics Concern  . Not on file   Social History Narrative  . No narrative on file    Review of Systems: See HPI, otherwise negative ROS  Physical Exam: BP (!) 78/48   Pulse 90   Temp 97.8 F (36.6  C) (Oral)   Resp (!) 26   Ht 5\' 4"  (1.626 m)   Wt 77 lb (34.9 kg)   SpO2 98%   BMI 13.22 kg/m  General:   Alert, Chronically ill-appearing pleasant. cooperative in NAD Neck:  Supple; no masses or thyromegaly. No significant cervical adenopathy. Lungs:  Clear throughout to auscultation.   No wheezes, crackles, or rhonchi. No acute distress. Heart:  Regular rate and rhythm; no murmurs, clicks, rubs,  or gallops. Abdomen: Non-distended, normal bowel sounds.  Soft and nontender without appreciable mass or hepatosplenomegaly.  Pulses:  Normal pulses noted. Extremities:  Without clubbing or edema.  Impression/ Recommendations:  I have offere the patient a diagnostic colonoscopy today per plan.The risks, benefits, limitations, alternatives and imponderables have been reviewed with the patient. Questions have been answered. All parties are agreeable.      Notice: This dictation was prepared with Dragon dictation along with smaller phrase technology. Any transcriptional errors that result from this process are unintentional and may not be corrected upon review.

## 2017-04-16 ENCOUNTER — Telehealth: Payer: Self-pay

## 2017-04-16 ENCOUNTER — Emergency Department (HOSPITAL_COMMUNITY)
Admission: EM | Admit: 2017-04-16 | Discharge: 2017-04-16 | Disposition: A | Discharge status: Home / Self Care | Payer: Medicare Other | Source: Home / Self Care | Attending: Emergency Medicine | Admitting: Emergency Medicine

## 2017-04-16 ENCOUNTER — Other Ambulatory Visit (HOSPITAL_COMMUNITY): Payer: Medicare Other

## 2017-04-16 ENCOUNTER — Encounter (HOSPITAL_COMMUNITY): Payer: Self-pay

## 2017-04-16 DIAGNOSIS — I2510 Atherosclerotic heart disease of native coronary artery without angina pectoris: Secondary | ICD-10-CM | POA: Insufficient documentation

## 2017-04-16 DIAGNOSIS — Z79899 Other long term (current) drug therapy: Secondary | ICD-10-CM | POA: Insufficient documentation

## 2017-04-16 DIAGNOSIS — J449 Chronic obstructive pulmonary disease, unspecified: Secondary | ICD-10-CM

## 2017-04-16 DIAGNOSIS — Z8541 Personal history of malignant neoplasm of cervix uteri: Secondary | ICD-10-CM

## 2017-04-16 DIAGNOSIS — F17210 Nicotine dependence, cigarettes, uncomplicated: Secondary | ICD-10-CM | POA: Insufficient documentation

## 2017-04-16 DIAGNOSIS — J029 Acute pharyngitis, unspecified: Secondary | ICD-10-CM

## 2017-04-16 DIAGNOSIS — K5730 Diverticulosis of large intestine without perforation or abscess without bleeding: Secondary | ICD-10-CM | POA: Diagnosis not present

## 2017-04-16 MED ORDER — AMOXICILLIN 500 MG PO CAPS: 500.0000 mg | ORAL_CAPSULE | Freq: Three times a day (TID) | ORAL | 0 refills | Status: DC

## 2017-04-16 NOTE — ED Triage Notes (Signed)
Pt reports left ear pain, sore throat, left eye has a white film over it. Pt reports this has been occurring 6 months intermittently. States she was told by a specialist her vocal cords were swollen

## 2017-04-16 NOTE — Telephone Encounter (Signed)
Endo called yesterday and said that RMR wants pt to have CT angio abd/pelvis to reassess mesenteric vasculature, abd pain, weight loss. CT angio scheduled for 04/26/17 at 8:00am, pt to arrive at 7:45am. NPO 4 hours prior to test. Tried to call pt to inform of appt, [facility] for her to call office. Someone then called office and told SS that she was gone for the day. Will try to call pt back next week. Letter mailed.

## 2017-04-16 NOTE — ED Notes (Signed)
Pt alert & oriented x4, stable gait. Patient given discharge instructions, paperwork & prescription(s). Patient  instructed to stop at the registration desk to finish any additional paperwork. Patient verbalized understanding. Pt left department w/ no further questions. 

## 2017-04-16 NOTE — Addendum Note (Signed)
Addendum  created 04/16/17 0738 by Vista Deck, CRNA   Charge Capture section accepted

## 2017-04-16 NOTE — Telephone Encounter (Signed)
No PA needed for CT angio per [employer] website. 

## 2017-04-17 NOTE — ED Provider Notes (Signed)
[place] DEPT Provider Note   CSN: [id number] Arrival date & time: 04/16/17  [name]     History   Chief Complaint Chief Complaint  Patient presents with  . Otalgia  . Sore Throat    HPI [name] is a 71 y.o. female.  The history is provided by the patient. No language interpreter was used.  Otalgia  This is a new problem. The current episode started more than 1 week ago. There is pain in both ears. The problem occurs constantly. The problem has been gradually worsening. There has been no fever. The pain is moderate. Her past medical history does not include chronic ear infection.  Sore Throat   Pt complains of a sore throat for the past year.  Pt reports she has seen Dr. Benjamine Mola and he says she has swollen vocal cords.  Pt reports worse and she now has ear pain.  Pt is requesting antibiotics.  Pt reports she is scheduled to have a biopsy next month.  Pt reports antibiotics have helped in the past.  Past Medical History:  Diagnosis Date  . Allergic rhinitis   . Anxiety   . Anxiety and depression   . Back pain, chronic   . CAD (coronary artery disease)    palpitations, dizziness, chest pain  . Cervical cancer (HCC)    cervical  . Chronic abdominal pain   . COPD (chronic obstructive pulmonary disease) ([place])   . Depression   . GERD (gastroesophageal reflux disease)   . Helicobacter pylori infection 2015   treated with prevpac  . Hematochezia   . History of kidney stones   . Hx of colonic polyps    adenomatous  . Hyperlipidemia    Lipid profile on 02/25/2011: 209, 113, 55, 132  . Nephrolithiasis   . Pancreatitis chronic   . Renal calculus   . Stroke [facility]) 3-4 yrs ago   left sided weakness  . Tobacco abuse   . Tubular adenoma 2015  . Weight loss    CT negative for occult malignancy, negative celiac, negative adrenal insufficiency    Patient Active Problem List   Diagnosis Date Noted  . Colitis 07/01/2016  . RUQ pain 10/19/2012  . Chronic nausea  05/07/2012  . Constipation 05/07/2012  . Laboratory test 02/17/2012  . Hyperlipidemia   . Chest pain 01/07/2011  . ANOREXIA 05/27/2010  . WEIGHT LOSS 05/27/2010  . COLONIC POLYPS, ADENOMATOUS, HX OF 12/20/2008  . ANXIETY DEPRESSION 12/19/2008  . Tobacco abuse 12/19/2008  . ALLERGIC RHINITIS, SEASONAL 12/19/2008  . COPD 12/19/2008  . GERD 12/19/2008  . Chronic pancreatitis ([place]) 12/19/2008  . BACK PAIN, CHRONIC 12/19/2008  . Abdominal pain 12/19/2008  . CERVICAL CANCER, HX OF 12/19/2008  . Nephrolithiasis 12/19/2008    Past Surgical History:  Procedure Laterality Date  . ABDOMINAL HYSTERECTOMY    . APPENDECTOMY    . CATARACT EXTRACTION W/PHACO Right 05/04/2013   Procedure: CATARACT EXTRACTION PHACO AND INTRAOCULAR LENS PLACEMENT (IOC);  Surgeon: Tonny Branch, MD;  Location: [facility];  Service: Ophthalmology;  Laterality: Right;  CDE:16.17  . CATARACT EXTRACTION W/PHACO Left 05/22/2013   Procedure: CATARACT EXTRACTION PHACO AND INTRAOCULAR LENS PLACEMENT (IOC);  Surgeon: Tonny Branch, MD;  Location: [facility];  Service: Ophthalmology;  Laterality: Left;  CDE:14.75  . COLONOSCOPY N/A 02/08/2014   Dr.Rourk- normal rectum, colonic polyps bx=tubular adenoma  . COLONOSCOPY W/ POLYPECTOMY  2006   XNA:[id number], benign gastric nodule, multiple adenomatous polyps, one with tubular morphology  . ESOPHAGOGASTRODUODENOSCOPY  2006  BDZ:HGDJME gastric nodule  . ESOPHAGOGASTRODUODENOSCOPY N/A 02/08/2014   Dr.Rourk- abnormal stomach and gstric nodule bx= hpylori  . EUS  2010   Dr. Ardis Hughs : Multiple shadowing calcifications in pancreas, consistent with Chronic Pancreatitis.  These are mainly confined to a collection of calcifications in head/uncinate pancreas. Otherwise the pancreatic parenchyma appears fairly normal.  The main pancreatic duct, CBD are both normal without stones, dilation.    . Ileocolonoscopy  01/11/2009   RMR: Polyp at the splenic flexure, status post hot snare removal/ Normal rectum,  tubular adenoma  . PARTIAL HYSTERECTOMY      OB History    Gravida Para Term Preterm AB Living   6 3 3   3      SAB TAB Ectopic Multiple Live Births   3               Home Medications    Prior to Admission medications   Medication Sig Start Date End Date Taking? Authorizing Provider  acetaminophen (TYLENOL) 325 MG tablet Take 650 mg by mouth every 6 (six) hours as needed for mild pain.    [provider]  ADVAIR DISKUS 250-50 MCG/DOSE AEPB Inhale 1 puff into the lungs 2 (two) times daily.  10/05/12   [provider]  amoxicillin (AMOXIL) 500 MG capsule Take 1 capsule (500 mg total) by mouth 3 (three) times daily. 04/16/17   Fransico Meadow, PA-C  Calcium Carbonate (CALCIUM 600 PO) Take 1 tablet by mouth daily.      [provider]  cilostazol (PLETAL) 100 MG tablet Take 100 mg by mouth 2 (two) times daily.  03/29/15   [provider]  CREON 24000-76000 units CPEP TAKE 2 CAPSULES WITH EACH MEALS AND 1 CAPSULE WITH SNACKS. 11/30/16   Carlis Stable, NP  dronabinol (MARINOL) 2.5 MG capsule Take 2.5 mg by mouth daily.    [provider]  DULoxetine (CYMBALTA) 60 MG capsule Take 60 mg by mouth 2 (two) times daily.     [provider]  escitalopram (LEXAPRO) 10 MG tablet Take 10 mg by mouth daily. 01/30/17   [provider]  hydrOXYzine (ATARAX/VISTARIL) 25 MG tablet Take 1 tablet by mouth 3 (three) times daily as needed for anxiety or itching.  04/21/16   [provider]  ipratropium (ATROVENT) 0.06 % nasal spray Place 2 sprays into both nostrils 2 (two) times daily as needed for rhinitis.    [provider]  linaclotide Rolan Lipa) 72 MCG capsule Take 1 capsule (72 mcg total) by mouth daily before breakfast. 12/01/16   Carlis Stable, NP  LORazepam (ATIVAN) 1 MG tablet Take 1 mg by mouth every 8 (eight) hours as needed for anxiety. Reported on 11/19/2015    [provider]  nitroGLYCERIN (NITROSTAT) 0.4 MG SL tablet Place  1 tablet (0.4 mg total) under the tongue every 5 (five) minutes as needed. 11/21/14   Herminio Commons, MD  omeprazole (PRILOSEC) 20 MG capsule TAKE 1 CAPSULE BY MOUTH ONCE DAILY. 06/10/16   Mahala Menghini, PA-C  oxyCODONE-acetaminophen (PERCOCET/ROXICET) 5-325 MG tablet Take 1 tablet by mouth every 6 (six) hours as needed for severe pain.    [provider]  polyethylene glycol-electrolytes (TRILYTE) 420 g solution Take 4,000 mLs by mouth as directed. 03/08/17   Rourk, Cristopher Estimable, MD  potassium chloride 20 MEQ TBCR Take 20 mEq by mouth daily. 04/13/17 04/15/17  Carlis Stable, NP  PROAIR HFA 108 [phone] Base) MCG/ACT inhaler Inhale 2 puffs  into the lungs every 4 (four) hours as needed for shortness of breath or wheezing. 01/30/17   [provider]  promethazine (PHENERGAN) 25 MG tablet Take 25 mg by mouth every 6 (six) hours as needed for nausea or vomiting.     [provider]  RESTASIS 0.05 % ophthalmic emulsion Place 1 drop into both eyes 2 (two) times daily. 03/27/16   [provider]    Family History Family History  Problem Relation Age of Onset  . Heart attack Father 55  . Colon cancer Maternal Grandmother     Social History Social History  Substance Use Topics  . Smoking status: Current Every Day Smoker    Packs/day: 0.50    Years: 50.00    Types: Cigarettes    Start date: 07/18/1962  . Smokeless tobacco: Never Used     Comment: 3/4 pack daily  . Alcohol use No     Comment: hx of ETOH about 20 years ago.      Allergies   Ibuprofen; Iohexol; and Strawberry (diagnostic)   Review of Systems Review of Systems  HENT: Positive for ear pain.   All other systems reviewed and are negative.    Physical Exam Updated Vital Signs BP 118/85 (BP Location: Left Arm)   Pulse (!) 105   Temp (!) 97.5 F (36.4 C) (Oral)   Resp 18   Ht 5\' 4"  (1.626 m)   Wt 34.9 kg (77 lb)   SpO2 98%   BMI 13.22 kg/m   Physical Exam  Constitutional: She appears  well-developed and well-nourished. No distress.  HENT:  Head: Normocephalic and atraumatic.  Harsh raspy voice  Eyes: Conjunctivae are normal.  Neck: Neck supple.  Cardiovascular: Normal rate and regular rhythm.   No murmur heard. Pulmonary/Chest: Effort normal and breath sounds normal. No respiratory distress.  Abdominal: Soft. There is no tenderness.  Musculoskeletal: She exhibits no edema.  Neurological: She is alert.  Skin: Skin is warm and dry.  Psychiatric: She has a normal mood and affect.  Nursing note and vitals reviewed.    ED Treatments / Results  Labs (all labs ordered are listed, but only abnormal results are displayed) Labs Reviewed - No data to display  EKG  EKG Interpretation None       Radiology No results found.  Procedures Procedures (including critical care time)  Medications Ordered in ED Medications - No data to display   Initial Impression / Assessment and Plan / ED Course  I have reviewed the triage vital signs and the nursing notes.  Pertinent labs & imaging results that were available during my care of the patient were reviewed by me and considered in my medical decision making (see chart for details).     I counseled pt.  I doubt antibiotics are of any benefit.  Pt is insistent that she wants to try.  I advised pt to call Dr. Benjamine Mola to be seen sooner for recheck.  Pt is a smoker.    Final Clinical Impressions(s) / ED Diagnoses   Final diagnoses:  Sore throat    New Prescriptions Discharge Medication List as of 04/16/2017  7:18 PM    START taking these medications   Details  amoxicillin (AMOXIL) 500 MG capsule Take 1 capsule (500 mg total) by mouth 3 (three) times daily., Starting Fri 04/16/2017, Print      An After Visit Summary was printed and given to the patient.   Fransico Meadow, [place] 04/17/17 0116  Davonna Belling, MD 04/17/17 [phone]

## 2017-04-19 ENCOUNTER — Ambulatory Visit (HOSPITAL_COMMUNITY)
Admission: RE | Admit: 2017-04-19 | Discharge: 2017-04-19 | Disposition: A | Discharge status: Home / Self Care | Payer: Medicare Other | Source: Ambulatory Visit | Attending: Family Medicine | Admitting: Family Medicine

## 2017-04-19 DIAGNOSIS — M810 Age-related osteoporosis without current pathological fracture: Secondary | ICD-10-CM | POA: Insufficient documentation

## 2017-04-19 NOTE — Telephone Encounter (Signed)
Spoke to pt. She was already aware of CT angio appt (she went to ER 04/16/17 and they told her). Reminded her to arrive at 7:45am 04/26/17 and NPO 4 hours prior to CT.

## 2017-04-20 ENCOUNTER — Encounter (HOSPITAL_COMMUNITY): Payer: Self-pay | Admitting: Internal Medicine

## 2017-04-26 ENCOUNTER — Ambulatory Visit (HOSPITAL_COMMUNITY): Payer: Medicare Other

## 2017-04-28 ENCOUNTER — Telehealth: Payer: Self-pay

## 2017-04-28 NOTE — Telephone Encounter (Signed)
Pt called office and LMOVM that she didn't have her CT done 04/26/17 d/t she was sick. Called pt. She had called to reschedule her CT but she has a contrast allergy and will need to be prepped first. She will be having CT angio abd/pelvis w and or without contrast. Advised pt that RMR was on vacation this week but I would send message to EG for advice.

## 2017-04-29 ENCOUNTER — Other Ambulatory Visit: Payer: Self-pay | Admitting: Nurse Practitioner

## 2017-04-29 MED ORDER — PREDNISONE 10 MG PO TABS: ORAL_TABLET | ORAL | 0 refills | Status: DC

## 2017-04-29 NOTE — Telephone Encounter (Signed)
Pt called office and was informed of appt and prep instructions. She is aware to have a driver. Letter mailed to pt.

## 2017-04-29 NOTE — Telephone Encounter (Signed)
Mild reaction of hives/itching.  Premedication:  13 hours prior: Prednisone 50 mg 7 hours prior: Prednisone 50 mg 1 hour prior: Prednisone 50 mg AND Benadryl 50 mg   Let me know if you need me to call in the Pred; benadryl is available OTC. She should have a drive in case she gets sleepy.

## 2017-04-29 NOTE — Telephone Encounter (Signed)
Rx for Prednisone sent to pharmacy. CT angio scheduled for 05/12/17 at 9:00am, pt to arrive at 8:45am. NPO 4 hours before test. Tried to call pt, went straight to VM. LMOVM for her to call office. Then tried again few minutes later. A lady answered and said that pt wasn't there but she would have pt call office.

## 2017-05-12 ENCOUNTER — Ambulatory Visit (HOSPITAL_COMMUNITY)
Admission: RE | Admit: 2017-05-12 | Discharge: 2017-05-12 | Disposition: A | Discharge status: Home / Self Care | Payer: Medicare Other | Source: Ambulatory Visit | Attending: Internal Medicine | Admitting: Internal Medicine

## 2017-05-12 DIAGNOSIS — K869 Disease of pancreas, unspecified: Secondary | ICD-10-CM | POA: Insufficient documentation

## 2017-05-12 DIAGNOSIS — R634 Abnormal weight loss: Secondary | ICD-10-CM | POA: Diagnosis present

## 2017-05-12 DIAGNOSIS — R109 Unspecified abdominal pain: Secondary | ICD-10-CM

## 2017-05-12 DIAGNOSIS — J439 Emphysema, unspecified: Secondary | ICD-10-CM | POA: Diagnosis not present

## 2017-05-12 MED ORDER — IOPAMIDOL (ISOVUE-370) INJECTION 76%
100.0000 mL | Freq: Once | INTRAVENOUS | Status: AC | PRN
  Administered 2017-05-12: 100 mL via INTRAVENOUS

## 2017-05-14 ENCOUNTER — Telehealth: Payer: Self-pay | Admitting: Nurse Practitioner

## 2017-05-14 NOTE — Telephone Encounter (Signed)
Spoke with Dr. Karie Kirks about the patient and plants to refer to [facility]. He is in agreement and states he will keep her on pain medication for the time being until [facility] has had a chance to evaluate.  Please refer to [facility] for chronic abdominal pain and weight loss.

## 2017-05-17 ENCOUNTER — Other Ambulatory Visit: Payer: Self-pay

## 2017-05-17 DIAGNOSIS — R109 Unspecified abdominal pain: Secondary | ICD-10-CM

## 2017-05-17 DIAGNOSIS — R634 Abnormal weight loss: Secondary | ICD-10-CM

## 2017-05-17 NOTE — Telephone Encounter (Signed)
Called [facility]. Appt scheduled for 07/27/17 at 2:00pm with Dr. Lynita Lombard. They will mail her a new pt packet. Referral info faxed. Called and informed pt.

## 2017-05-29 ENCOUNTER — Other Ambulatory Visit: Payer: Self-pay | Admitting: Nurse Practitioner

## 2017-05-29 DIAGNOSIS — K5900 Constipation, unspecified: Secondary | ICD-10-CM

## 2017-06-02 ENCOUNTER — Ambulatory Visit (HOSPITAL_COMMUNITY)
Admission: RE | Admit: 2017-06-02 | Discharge: 2017-06-02 | Disposition: A | Discharge status: Home / Self Care | Payer: Medicare Other | Source: Ambulatory Visit | Attending: Family Medicine | Admitting: Family Medicine

## 2017-06-02 ENCOUNTER — Other Ambulatory Visit (HOSPITAL_COMMUNITY): Payer: Self-pay | Admitting: Family Medicine

## 2017-06-02 DIAGNOSIS — M810 Age-related osteoporosis without current pathological fracture: Secondary | ICD-10-CM | POA: Insufficient documentation

## 2017-06-02 DIAGNOSIS — M545 Low back pain: Secondary | ICD-10-CM | POA: Diagnosis not present

## 2017-06-02 DIAGNOSIS — R52 Pain, unspecified: Secondary | ICD-10-CM

## 2017-06-02 DIAGNOSIS — R634 Abnormal weight loss: Secondary | ICD-10-CM | POA: Insufficient documentation

## 2017-06-09 ENCOUNTER — Ambulatory Visit (INDEPENDENT_AMBULATORY_CARE_PROVIDER_SITE_OTHER): Payer: Medicare Other | Admitting: Nurse Practitioner

## 2017-06-09 ENCOUNTER — Encounter: Payer: Self-pay | Admitting: Nurse Practitioner

## 2017-06-09 VITALS — BP 108/74 | HR 78 | Temp 96.7°F | Resp 2 | Ht 60.0 in | Wt 76.6 lb

## 2017-06-09 DIAGNOSIS — R1011 Right upper quadrant pain: Secondary | ICD-10-CM | POA: Diagnosis not present

## 2017-06-09 DIAGNOSIS — R197 Diarrhea, unspecified: Secondary | ICD-10-CM | POA: Diagnosis not present

## 2017-06-09 DIAGNOSIS — R112 Nausea with vomiting, unspecified: Secondary | ICD-10-CM

## 2017-06-09 DIAGNOSIS — K5903 Drug induced constipation: Secondary | ICD-10-CM

## 2017-06-09 DIAGNOSIS — K861 Other chronic pancreatitis: Secondary | ICD-10-CM

## 2017-06-09 NOTE — Assessment & Plan Note (Signed)
Baseline constipation previously on Linzess 72 g having one bowel movement a week. Her Linzess was increased to 145 g and is now going about twice a week. She did have some diarrhea, as per below. Decision made to not increase her Linzess further given her diarrhea which can be an adverse effect of Linzess. Continue current prescription medications. Follow-up with [facility]. Return for follow-up here in 6 months.

## 2017-06-09 NOTE — Assessment & Plan Note (Signed)
Known chronic pancreatitis. CT report reviewed which indicates no significant change in pancreatic cyst likely sequela of chronic pancreatitis. Her pancreatitis could also be contributing in element to her chronic pain. She has an appointment to follow-up at [facility] for further evaluation and I recommended she keep that appointment. Return for follow-up here in 6 months. Continue perception chronic pain medications per PCP.

## 2017-06-09 NOTE — Progress Notes (Signed)
CC'ED TO PCP 

## 2017-06-09 NOTE — Progress Notes (Signed)
Referring Provider: Lemmie Evens, MD Primary Care Physician:  Lemmie Evens, MD Primary GI:  Dr. Gala Romney  Chief Complaint  Patient presents with  . Abdominal Pain    ruq  . Nausea  . Diarrhea    x4 days last week  . Constipation    HPI:   [name] is a 71 y.o. female who presents For abdominal pain, nausea, vomiting, constipation. The patient was last seen in our office 03/08/2017 for right upper quadrant pain and weight loss. History of idiopathic chronic pancreatitis, reduction in opioids resulting in more musculoskeletal pain. Going one week between bowel movements and multiple bowel movements over 2 days the patient tried and failed Movantik and Trulance. Was trialed on Linzess 72 g which resolved her constipation. Chronic abdominal pain for over 20 years. Nausea and vomiting managed with Phenergan. Previously recommended HIDA scan had not been completed at her last visit. At her last visit she was doing better, pain better because her pain medication was restarted, couple pound weight gain, appetite improved. Bowel movement once a week, still on Linzess and stools are loose. Does have some fecal urgency. CT unable to rule out ischemic colitis and CT angiogram deemed likely helpful if colonoscopy was normal. She is on Creon for her chronic pancreatitis. Recommended colonoscopy. Follow-up in 3 months.  Colonoscopy was completed on 04/15/2017 on propofol/MAC. Findings included normal colon, normal exam. Recommended resume previous diet, increase Linzess to 145 g daily, CTA abdomen and pelvis to reassure mesenteric vasculature, repeat colonoscopy in 5 years.  CT angiogram completed 05/12/2017 which found celiac artery patent, SMA patent, renal , IMA patent. Impression of no significant narrowing in the Desyrel vasculature, no acute vascular pathology. Subtle complex solid and cystic mass in the head of the pancreas with calcifications dating back to 2009 which is likely  abnormality related to chronic pancreatitis and local duct dilation.  After consultation with the patient's primary care and explaining no etiology for abdominal pain he was in agreement with our plan to refer the patient was referred to [facility] for chronic abdominal pain and states he would keep the patient on pain medications until etiology is revealed.  The results of her CT angiogram and her recent colonoscopy were reviewed with her. Today she states she's still having chronic pain at multiple sites including RUQ, back, and others, about the same in intensity and frequency. Still with nausea, Phenergan helps manage her nausea well. Had an episode of emesis a couple days prior, no hematemesis. On linzess 145 mcg, is going twice a week (rather than once a week previously) but may also go 4-5 times in a day. Recently had diarrhea this past week for 4 days which has self resolved. Denies hematochezia, melena, fever, chills.  She has lost 1 lb in the past 3 months. Appetite is about the same, "not great but not terrible." Dyspnea is chronic and at her baseline. Denies chest pain, dizziness, lightheadedness, syncope, near syncope. Denies any other upper or lower GI symptoms.  Past Medical History:  Diagnosis Date  . Allergic rhinitis   . Anxiety   . Anxiety and depression   . Back pain, chronic   . CAD (coronary artery disease)    palpitations, dizziness, chest pain  . Cervical cancer (HCC)    cervical  . Chronic abdominal pain   . COPD (chronic obstructive pulmonary disease) ([place])   . Depression   . GERD (gastroesophageal reflux disease)   . Helicobacter  pylori infection 2015   treated with prevpac  . Hematochezia   . History of kidney stones   . Hx of colonic polyps    adenomatous  . Hyperlipidemia    Lipid profile on 02/25/2011: 209, 113, 55, 132  . Nephrolithiasis   . Pancreatitis chronic   . Renal calculus   . Stroke [facility]) 3-4 yrs ago   left sided weakness   . Tobacco abuse   . Tubular adenoma 2015  . Weight loss    CT negative for occult malignancy, negative celiac, negative adrenal insufficiency    Past Surgical History:  Procedure Laterality Date  . ABDOMINAL HYSTERECTOMY    . APPENDECTOMY    . CATARACT EXTRACTION W/PHACO Right 05/04/2013   Procedure: CATARACT EXTRACTION PHACO AND INTRAOCULAR LENS PLACEMENT (IOC);  Surgeon: Tonny Branch, MD;  Location: [facility] ORS;  Service: Ophthalmology;  Laterality: Right;  CDE:16.17  . CATARACT EXTRACTION W/PHACO Left 05/22/2013   Procedure: CATARACT EXTRACTION PHACO AND INTRAOCULAR LENS PLACEMENT (IOC);  Surgeon: Tonny Branch, MD;  Location: [facility] ORS;  Service: Ophthalmology;  Laterality: Left;  CDE:14.75  . COLONOSCOPY N/A 02/08/2014   Dr.Rourk- normal rectum, colonic polyps bx=tubular adenoma  . COLONOSCOPY W/ POLYPECTOMY  2006   GYB:[id number], benign gastric nodule, multiple adenomatous polyps, one with tubular morphology  . COLONOSCOPY WITH PROPOFOL N/A 04/15/2017   Procedure: COLONOSCOPY WITH PROPOFOL;  Surgeon: Daneil Dolin, MD;  Location: [facility] ENDO SUITE;  Service: Endoscopy;  Laterality: N/A;  11:15am  . ESOPHAGOGASTRODUODENOSCOPY  2006   GOT:LXBWIO gastric nodule  . ESOPHAGOGASTRODUODENOSCOPY N/A 02/08/2014   Dr.Rourk- abnormal stomach and gstric nodule bx= hpylori  . EUS  2010   Dr. Ardis Hughs : Multiple shadowing calcifications in pancreas, consistent with Chronic Pancreatitis.  These are mainly confined to a collection of calcifications in head/uncinate pancreas. Otherwise the pancreatic parenchyma appears fairly normal.  The main pancreatic duct, CBD are both normal without stones, dilation.    . Ileocolonoscopy  01/11/2009   RMR: Polyp at the splenic flexure, status post hot snare removal/ Normal rectum, tubular adenoma  . PARTIAL HYSTERECTOMY      Current Outpatient Prescriptions  Medication Sig Dispense Refill  . acetaminophen (TYLENOL) 325 MG tablet Take 650 mg by mouth every 6 (six) hours as  needed for mild pain.    Marland Kitchen ADVAIR DISKUS 250-50 MCG/DOSE AEPB Inhale 1 puff into the lungs 2 (two) times daily.     . Calcium Carbonate (CALCIUM 600 PO) Take 1 tablet by mouth daily.      . CHANTIX STARTING MONTH PAK 0.5 MG X 11 & 1 MG X 42 tablet Take 1 tablet by mouth 2 (two) times daily.    . cilostazol (PLETAL) 100 MG tablet Take 100 mg by mouth 2 (two) times daily.     Marland Kitchen CREON 24000-76000 units CPEP TAKE 2 CAPSULES WITH EACH MEALS AND 1 CAPSULE WITH SNACKS. 240 capsule 5  . DULoxetine (CYMBALTA) 60 MG capsule Take 60 mg by mouth 2 (two) times daily.     Marland Kitchen escitalopram (LEXAPRO) 10 MG tablet Take 10 mg by mouth daily.    . hydrOXYzine (ATARAX/VISTARIL) 25 MG tablet Take 1 tablet by mouth 3 (three) times daily as needed for anxiety or itching.     Marland Kitchen ipratropium (ATROVENT) 0.06 % nasal spray Place 2 sprays into both nostrils 2 (two) times daily as needed for rhinitis.    Marland Kitchen LINZESS 145 MCG CAPS capsule Take 1 capsule by mouth daily.    Marland Kitchen LORazepam (ATIVAN)  1 MG tablet Take 1 mg by mouth every 8 (eight) hours as needed for anxiety. Reported on 11/19/2015    . nitroGLYCERIN (NITROSTAT) 0.4 MG SL tablet Place 1 tablet (0.4 mg total) under the tongue every 5 (five) minutes as needed. 25 tablet 3  . omeprazole (PRILOSEC) 20 MG capsule TAKE 1 CAPSULE BY MOUTH ONCE DAILY. 30 capsule 11  . oxyCODONE-acetaminophen (PERCOCET/ROXICET) 5-325 MG tablet Take 1 tablet by mouth every 6 (six) hours as needed for severe pain.    . potassium chloride 20 MEQ TBCR Take 20 mEq by mouth daily. 2 tablet 0  . PROAIR HFA 108 (90 Base) MCG/ACT inhaler Inhale 2 puffs into the lungs every 4 (four) hours as needed for shortness of breath or wheezing.    . promethazine (PHENERGAN) 25 MG tablet Take 25 mg by mouth every 6 (six) hours as needed for nausea or vomiting.     . RESTASIS 0.05 % ophthalmic emulsion Place 1 drop into both eyes 2 (two) times daily.    . Vitamin D, Ergocalciferol, (DRISDOL) 50000 units CAPS capsule Take  50,000 Units by mouth every 7 (seven) days.     No current facility-administered medications for this visit.     Allergies as of 06/09/2017 - Review Complete 06/09/2017  Allergen Reaction Noted  . Ibuprofen Anaphylaxis and Hives 12/19/2008  . Iohexol Hives and Swelling 05/23/2004  . Strawberry (diagnostic) Itching 11/09/2015    Family History  Problem Relation Age of Onset  . Heart attack Father 66  . Colon cancer Maternal Grandmother     Social History   Social History  . Marital status: Divorced    Spouse name: N/A  . Number of children: 3  . Years of education: N/A   Occupational History  . disabled Unemployed   Social History Main Topics  . Smoking status: Current Every Day Smoker    Packs/day: 0.50    Years: 50.00    Types: Cigarettes    Start date: 07/18/1962  . Smokeless tobacco: Never Used     Comment: 3/4 pack daily  . Alcohol use No     Comment: hx of ETOH about 20 years ago.   . Drug use: No  . Sexual activity: No   Other Topics Concern  . None   Social History Narrative  . None    Review of Systems: General: Negative for significant weight loss, fever, chills. ENT: Negative for hoarseness, difficulty swallowing. CV: Negative for chest pain, angina, palpitations, peripheral edema.  Respiratory: Negative for dyspnea at rest, cough, sputum, wheezing.  GI: See history of present illness. MS: Admits chronic pain.  Derm: Negative for rash or itching.  Endo: Negative for unusual weight change.  Heme: Negative for bruising or bleeding. Allergy: Negative for rash or hives.   Physical Exam: BP 108/74   Pulse 78   Temp (!) 96.7 F (35.9 C) (Oral)   Resp (!) 2   Ht 5' (1.524 m)   Wt 76 lb 9.6 oz (34.7 kg)   BMI 14.96 kg/m  General:   Alert and oriented. Pleasant and cooperative. Very thin female.  Head:  Normocephalic and atraumatic. Eyes:  Without icterus, sclera clear and conjunctiva pink.  Ears:  Normal auditory acuity. Cardiovascular:   S1, S2 present without murmurs appreciated. Extremities without clubbing or edema. Respiratory:  Clear to auscultation bilaterally. No wheezes, rales, or rhonchi. No distress.  Gastrointestinal:  +BS, soft, and non-distended. Abdominal TTP, worst in RUQ. No HSM noted. No guarding or  rebound. No masses appreciated.  Rectal:  Deferred  Musculoskalatal:  Symmetrical without gross deformities. Neurologic:  Alert and oriented x4;  grossly normal neurologically. Psych:  Alert and cooperative. Normal mood and affect. Heme/Lymph/Immune: No excessive bruising noted.    06/09/2017 8:39 AM   Disclaimer: This note was dictated with voice recognition software. Similar sounding words can inadvertently be transcribed and may not be corrected upon review.

## 2017-06-09 NOTE — Assessment & Plan Note (Signed)
Chronic abdominal pain is unchanged, no improvement but no worse. She has had an extensive GI evaluation related to her pain. She does have chronic pancreatitis which could be contributing. She does have chronic pain of multiple other sites as well. I recommended she continue her prescription medications. She has an appointment at [facility] for further evaluation. I recommended she keep that appointment, return for follow-up here in 6 months. Call if any worsening or severe symptoms.

## 2017-06-09 NOTE — Patient Instructions (Signed)
1. Continue all your current medications. 2. We will let you know when your appointment at [facility] is. 3. Return for follow-up here in 6 months. 4. Call if you have any questions or concerns.

## 2017-06-09 NOTE — Assessment & Plan Note (Signed)
The patient has nausea and occasional/rare vomiting. Last episode of emesis about a week ago. Overall Phenergan controls her nausea to her satisfaction. Recommend she continue prescription Phenergan at this time for nausea control.

## 2017-06-09 NOTE — Assessment & Plan Note (Signed)
The patient had 4 days of diarrhea about a week ago which self resolved. This could be an adverse effect from her Linzess as she is currently taking 145 g a day. Also possible GI virus. Unlikely chronic colonic infection given the brief and self-limiting nature of her diarrhea. I will not increase her Linzess further given her recent diarrhea. She is to notify us if diarrhea returns. Return for follow-up in 6 months. Continue prescription Creon.

## 2017-06-21 ENCOUNTER — Ambulatory Visit (INDEPENDENT_AMBULATORY_CARE_PROVIDER_SITE_OTHER): Payer: Medicare Other | Admitting: Otolaryngology

## 2017-06-21 DIAGNOSIS — R070 Pain in throat: Secondary | ICD-10-CM

## 2017-06-21 DIAGNOSIS — R490 Dysphonia: Secondary | ICD-10-CM | POA: Diagnosis not present

## 2017-06-21 DIAGNOSIS — F17210 Nicotine dependence, cigarettes, uncomplicated: Secondary | ICD-10-CM | POA: Diagnosis not present

## 2017-07-27 ENCOUNTER — Encounter (HOSPITAL_COMMUNITY): Payer: Self-pay | Admitting: *Deleted

## 2017-07-27 ENCOUNTER — Emergency Department (HOSPITAL_COMMUNITY)
Admission: EM | Admit: 2017-07-27 | Discharge: 2017-07-27 | Disposition: A | Discharge status: Home / Self Care | Payer: Medicare Other | Attending: Emergency Medicine | Admitting: Emergency Medicine

## 2017-07-27 DIAGNOSIS — Z8541 Personal history of malignant neoplasm of cervix uteri: Secondary | ICD-10-CM | POA: Insufficient documentation

## 2017-07-27 DIAGNOSIS — Z79899 Other long term (current) drug therapy: Secondary | ICD-10-CM | POA: Diagnosis not present

## 2017-07-27 DIAGNOSIS — F419 Anxiety disorder, unspecified: Secondary | ICD-10-CM | POA: Diagnosis not present

## 2017-07-27 DIAGNOSIS — F17210 Nicotine dependence, cigarettes, uncomplicated: Secondary | ICD-10-CM | POA: Diagnosis not present

## 2017-07-27 DIAGNOSIS — H9203 Otalgia, bilateral: Secondary | ICD-10-CM | POA: Diagnosis present

## 2017-07-27 DIAGNOSIS — Z7902 Long term (current) use of antithrombotics/antiplatelets: Secondary | ICD-10-CM | POA: Insufficient documentation

## 2017-07-27 DIAGNOSIS — J029 Acute pharyngitis, unspecified: Secondary | ICD-10-CM | POA: Insufficient documentation

## 2017-07-27 DIAGNOSIS — I2510 Atherosclerotic heart disease of native coronary artery without angina pectoris: Secondary | ICD-10-CM | POA: Diagnosis not present

## 2017-07-27 DIAGNOSIS — Z8673 Personal history of transient ischemic attack (TIA), and cerebral infarction without residual deficits: Secondary | ICD-10-CM | POA: Insufficient documentation

## 2017-07-27 DIAGNOSIS — F329 Major depressive disorder, single episode, unspecified: Secondary | ICD-10-CM | POA: Insufficient documentation

## 2017-07-27 DIAGNOSIS — J449 Chronic obstructive pulmonary disease, unspecified: Secondary | ICD-10-CM | POA: Insufficient documentation

## 2017-07-27 MED ORDER — HYDROCORTISONE-ACETIC ACID 1-2 % OT SOLN: 3.0000 [drp] | Freq: Two times a day (BID) | OTIC | 0 refills | Status: AC

## 2017-07-27 NOTE — ED Triage Notes (Signed)
Headache, earache and sore throat for over a year

## 2017-07-27 NOTE — Discharge Instructions (Signed)
Apply eardrops twice daily for your ear pain.  Follow-up with your ENT for reevaluation of your symptoms.  Return to the ED for any concerning signs or symptoms develop.

## 2017-07-27 NOTE — ED Provider Notes (Signed)
[facility] EMERGENCY DEPARTMENT Provider Note   CSN: [id number] Arrival date & time: 07/27/17  2001     History   Chief Complaint Chief Complaint  Patient presents with  . Otalgia  . Sore Throat    HPI [name] is a 71 y.o. female with history of allergic rhinitis, anxiety, GERD who presents today with chief complaint chronic sore throat and bilateral earache for 1.5 years.  Patient states that she is followed by ENT Dr. Benjamine Mola for her symptoms and has told her that she has swollen vocal cords.  She went and saw her primary care physician on Friday for sore throat and ear pain and he put her on amoxicillin 500 mg 3 times daily for 10 days.  Pain is aching and sharp resulting in a headache.  She states the symptoms are no worse than her usual symptoms.  She is a current smoker of approximately 10 cigarettes but has been smoking for decades.  Denies shortness of breath, cough, fevers, chills, chest pain.  Has chronic nasal congestion which is unchanged.  The history is provided by the patient.    Past Medical History:  Diagnosis Date  . Allergic rhinitis   . Anxiety   . Anxiety and depression   . Back pain, chronic   . CAD (coronary artery disease)    palpitations, dizziness, chest pain  . Cervical cancer (HCC)    cervical  . Chronic abdominal pain   . COPD (chronic obstructive pulmonary disease) ([place])   . Depression   . GERD (gastroesophageal reflux disease)   . Helicobacter pylori infection 2015   treated with prevpac  . Hematochezia   . History of kidney stones   . Hx of colonic polyps    adenomatous  . Hyperlipidemia    Lipid profile on 02/25/2011: 209, 113, 55, 132  . Nephrolithiasis   . Pancreatitis chronic   . Renal calculus   . Stroke [facility]) 3-4 yrs ago   left sided weakness  . Tobacco abuse   . Tubular adenoma 2015  . Weight loss    CT negative for occult malignancy, negative celiac, negative adrenal insufficiency    Patient Active Problem List   Diagnosis Date Noted  . Diarrhea 06/09/2017  . Colitis 07/01/2016  . RUQ pain 10/19/2012  . Nausea with vomiting 05/07/2012  . Constipation 05/07/2012  . Laboratory test 02/17/2012  . Hyperlipidemia   . Chest pain 01/07/2011  . ANOREXIA 05/27/2010  . WEIGHT LOSS 05/27/2010  . COLONIC POLYPS, ADENOMATOUS, HX OF 12/20/2008  . ANXIETY DEPRESSION 12/19/2008  . Tobacco abuse 12/19/2008  . ALLERGIC RHINITIS, SEASONAL 12/19/2008  . COPD 12/19/2008  . GERD 12/19/2008  . Chronic pancreatitis ([place]) 12/19/2008  . BACK PAIN, CHRONIC 12/19/2008  . Abdominal pain 12/19/2008  . CERVICAL CANCER, HX OF 12/19/2008  . Nephrolithiasis 12/19/2008    Past Surgical History:  Procedure Laterality Date  . ABDOMINAL HYSTERECTOMY    . APPENDECTOMY    . CATARACT EXTRACTION W/PHACO Right 05/04/2013   Procedure: CATARACT EXTRACTION PHACO AND INTRAOCULAR LENS PLACEMENT (IOC);  Surgeon: Tonny Branch, MD;  Location: [facility] ORS;  Service: Ophthalmology;  Laterality: Right;  CDE:16.17  . CATARACT EXTRACTION W/PHACO Left 05/22/2013   Procedure: CATARACT EXTRACTION PHACO AND INTRAOCULAR LENS PLACEMENT (IOC);  Surgeon: Tonny Branch, MD;  Location: [facility] ORS;  Service: Ophthalmology;  Laterality: Left;  CDE:14.75  . COLONOSCOPY N/A 02/08/2014   Dr.Rourk- normal rectum, colonic polyps bx=tubular adenoma  . COLONOSCOPY W/ POLYPECTOMY  2006  [id number], benign gastric nodule, multiple adenomatous polyps, one with tubular morphology  . COLONOSCOPY WITH PROPOFOL N/A 04/15/2017   Procedure: COLONOSCOPY WITH PROPOFOL;  Surgeon: Daneil Dolin, MD;  Location: [facility] ENDO SUITE;  Service: Endoscopy;  Laterality: N/A;  11:15am  . ESOPHAGOGASTRODUODENOSCOPY  2006   [id number] gastric nodule  . ESOPHAGOGASTRODUODENOSCOPY N/A 02/08/2014   Dr.Rourk- abnormal stomach and gstric nodule bx= hpylori  . EUS  2010   Dr. Ardis Hughs : Multiple shadowing calcifications in pancreas, consistent with Chronic Pancreatitis.  These are mainly confined to a  collection of calcifications in head/uncinate pancreas. Otherwise the pancreatic parenchyma appears fairly normal.  The main pancreatic duct, CBD are both normal without stones, dilation.    . Ileocolonoscopy  01/11/2009   RMR: Polyp at the splenic flexure, status post hot snare removal/ Normal rectum, tubular adenoma  . PARTIAL HYSTERECTOMY      OB History    Gravida Para Term Preterm AB Living   6 3 3   3      SAB TAB Ectopic Multiple Live Births   3               Home Medications    Prior to Admission medications   Medication Sig Start Date End Date Taking? Authorizing Provider  acetaminophen (TYLENOL) 325 MG tablet Take 650 mg by mouth every 6 (six) hours as needed for mild pain.    [provider]  acetic acid-hydrocortisone (VOSOL-HC) OTIC solution Place 3 drops into the left ear 2 (two) times daily. 07/27/17 08/01/17  Kyandre Okray A, PA-C  ADVAIR DISKUS 250-50 MCG/DOSE AEPB Inhale 1 puff into the lungs 2 (two) times daily.  10/05/12   [provider]  Calcium Carbonate (CALCIUM 600 PO) Take 1 tablet by mouth daily.      [provider]  CHANTIX STARTING MONTH PAK 0.5 MG X 11 & 1 MG X 42 tablet Take 1 tablet by mouth 2 (two) times daily. 04/02/17   [provider]  cilostazol (PLETAL) 100 MG tablet Take 100 mg by mouth 2 (two) times daily.  03/29/15   [provider]  CREON 24000-76000 units CPEP TAKE 2 CAPSULES WITH EACH MEALS AND 1 CAPSULE WITH SNACKS. 11/30/16   Carlis Stable, NP  DULoxetine (CYMBALTA) 60 MG capsule Take 60 mg by mouth 2 (two) times daily.     [provider]  escitalopram (LEXAPRO) 10 MG tablet Take 10 mg by mouth daily. 01/30/17   [provider]  hydrOXYzine (ATARAX/VISTARIL) 25 MG tablet Take 1 tablet by mouth 3 (three) times daily as needed for anxiety or itching.  04/21/16   [provider]  ipratropium (ATROVENT) 0.06 % nasal spray Place 2 sprays into both nostrils 2 (two) times daily as needed for  rhinitis.    [provider]  LINZESS 145 MCG CAPS capsule Take 1 capsule by mouth daily. 04/16/17   [provider]  LORazepam (ATIVAN) 1 MG tablet Take 1 mg by mouth every 8 (eight) hours as needed for anxiety. Reported on 11/19/2015    [provider]  nitroGLYCERIN (NITROSTAT) 0.4 MG SL tablet Place 1 tablet (0.4 mg total) under the tongue every 5 (five) minutes as needed. 11/21/14   Herminio Commons, MD  omeprazole (PRILOSEC) 20 MG capsule TAKE 1 CAPSULE BY MOUTH ONCE DAILY. 06/10/16   Mahala Menghini, PA-C  oxyCODONE-acetaminophen (PERCOCET/ROXICET) 5-325 MG tablet Take 1 tablet by mouth every 6 (six) hours as needed for severe pain.  [provider]  potassium chloride 20 MEQ TBCR Take 20 mEq by mouth daily. 04/13/17 06/09/17  Carlis Stable, NP  PROAIR HFA 108 [phone] Base) MCG/ACT inhaler Inhale 2 puffs into the lungs every 4 (four) hours as needed for shortness of breath or wheezing. 01/30/17   [provider]  promethazine (PHENERGAN) 25 MG tablet Take 25 mg by mouth every 6 (six) hours as needed for nausea or vomiting.     [provider]  RESTASIS 0.05 % ophthalmic emulsion Place 1 drop into both eyes 2 (two) times daily. 03/27/16   [provider]  Vitamin D, Ergocalciferol, (DRISDOL) 50000 units CAPS capsule Take 50,000 Units by mouth every 7 (seven) days.    [provider]    Family History Family History  Problem Relation Age of Onset  . Heart attack Father 78  . Colon cancer Maternal Grandmother     Social History Social History  Substance Use Topics  . Smoking status: Current Every Day Smoker    Packs/day: 0.50    Years: 50.00    Types: Cigarettes    Start date: 07/18/1962  . Smokeless tobacco: Never Used     Comment: 3/4 pack daily  . Alcohol use No     Comment: hx of ETOH about 20 years ago.      Allergies   Ibuprofen; Iohexol; and Strawberry (diagnostic)   Review of Systems Review of Systems    Constitutional: Negative for chills and fever.  HENT: Positive for congestion, ear pain and sore throat. Negative for ear discharge, sinus pain, sinus pressure and trouble swallowing.   Eyes: Negative for visual disturbance.  Respiratory: Negative for shortness of breath.   Cardiovascular: Negative for chest pain.     Physical Exam Updated Vital Signs BP 113/67 (BP Location: Right Arm)   Pulse 79   Temp 98.2 F (36.8 C) (Oral)   Resp 17   Ht 5\' 4"  (1.626 m)   Wt 34.2 kg (75 lb 5 oz)   SpO2 96%   BMI 12.93 kg/m   Physical Exam  Constitutional: She appears well-developed and well-nourished. No distress.  HENT:  Head: Normocephalic and atraumatic.  No frontal or maxillary sinus TTP.  Nasal septum midline with pale pink boggy mucosa and edema bilaterally.  TMs with middle ear effusion but no erythema or bulging.  There is pain with insertion of the speculum into the left ear canal.  No significant swelling, erythema, or pain with palpation of the pinna.  No mastoid tenderness.  Posterior oropharynx without erythema, tonsillar hypertrophy, exudates, or uvular deviation.  No trismus or sublingual abnormalities.  Eyes: Conjunctivae are normal. Right eye exhibits no discharge. Left eye exhibits no discharge.  Neck: Normal range of motion. Neck supple. No JVD present. No tracheal deviation present.  Cardiovascular: Normal rate and regular rhythm.   Pulmonary/Chest: Effort normal.  Globally diminished breath sounds, equal rise and fall of chest, no increased work of breathing  Abdominal: She exhibits no distension.  Musculoskeletal: She exhibits no edema.  Lymphadenopathy:    She has no cervical adenopathy.  Neurological: She is alert.  Skin: No erythema.  Psychiatric: She has a normal mood and affect. Her behavior is normal.  Nursing note and vitals reviewed.    ED Treatments / Results  Labs (all labs ordered are listed, but only abnormal results are displayed) Labs Reviewed -  No data to display  EKG  EKG Interpretation None       Radiology No  results found.  Procedures Procedures (including critical care time)  Medications Ordered in ED Medications - No data to display   Initial Impression / Assessment and Plan / ED Course  I have reviewed the triage vital signs and the nursing notes.  Pertinent labs & imaging results that were available during my care of the patient were reviewed by me and considered in my medical decision making (see chart for details).     Patient with chronic otalgia and sore throat.  Afebrile, vital signs are stable.  No worsening of symptoms.  Airway is patent with no facial swelling, no angioedema, no drooling.  She is tolerating secretions.  Examination is not concerning for PTA, strep pharyngitis, or soft tissue infection.  She has mild pain with insertion of the speculum into the left ear, possible mild otitis externa.  She is already taking antibiotics for her symptoms as prescribed by her primary care physician.  Will discharge with Acetasol  otic drops for her ear discomfort.  Encouraged patient to follow-up with her ENT Dr. Benjamine Mola for reevaluation.  Discussed indications for return to the ED. Pt verbalized understanding of and agreement with plan and is safe for discharge home at this time.   Final Clinical Impressions(s) / ED Diagnoses   Final diagnoses:  Sore throat  Otalgia of both ears    New Prescriptions New Prescriptions   ACETIC ACID-HYDROCORTISONE (VOSOL-HC) OTIC SOLUTION    Place 3 drops into the left ear 2 (two) times daily.     Renita Papa, PA-C 07/27/17 2207    Milton Ferguson, MD 07/27/17 [phone]

## 2017-07-30 ENCOUNTER — Other Ambulatory Visit (HOSPITAL_COMMUNITY): Payer: Self-pay | Admitting: Family Medicine

## 2017-07-30 DIAGNOSIS — Z1231 Encounter for screening mammogram for malignant neoplasm of breast: Secondary | ICD-10-CM

## 2017-08-13 ENCOUNTER — Encounter (HOSPITAL_COMMUNITY): Payer: Self-pay

## 2017-08-13 ENCOUNTER — Ambulatory Visit (HOSPITAL_COMMUNITY)
Admission: RE | Admit: 2017-08-13 | Discharge: 2017-08-13 | Disposition: A | Discharge status: Home / Self Care | Payer: Medicare Other | Source: Ambulatory Visit | Attending: Family Medicine | Admitting: Family Medicine

## 2017-08-13 DIAGNOSIS — Z1231 Encounter for screening mammogram for malignant neoplasm of breast: Secondary | ICD-10-CM

## 2017-08-31 ENCOUNTER — Telehealth: Payer: Self-pay | Admitting: General Practice

## 2017-08-31 NOTE — Telephone Encounter (Signed)
I received a call from [facility] asking [place] to [employer] Share [name]'s ct scan dated 05/12/17.  I spoke with [phone] in the film room at [facility] and she stated she will download the images and share them with [facility].

## 2017-12-07 ENCOUNTER — Encounter: Payer: Self-pay | Admitting: Nurse Practitioner

## 2017-12-07 ENCOUNTER — Ambulatory Visit (INDEPENDENT_AMBULATORY_CARE_PROVIDER_SITE_OTHER): Payer: Medicare Other | Admitting: Nurse Practitioner

## 2017-12-07 VITALS — BP 124/72 | HR 81 | Temp 97.0°F | Ht 62.0 in | Wt 76.6 lb

## 2017-12-07 DIAGNOSIS — R112 Nausea with vomiting, unspecified: Secondary | ICD-10-CM

## 2017-12-07 DIAGNOSIS — K219 Gastro-esophageal reflux disease without esophagitis: Secondary | ICD-10-CM

## 2017-12-07 DIAGNOSIS — R1011 Right upper quadrant pain: Secondary | ICD-10-CM

## 2017-12-07 DIAGNOSIS — K861 Other chronic pancreatitis: Secondary | ICD-10-CM

## 2017-12-07 DIAGNOSIS — R634 Abnormal weight loss: Secondary | ICD-10-CM | POA: Diagnosis not present

## 2017-12-07 MED ORDER — OMEPRAZOLE 20 MG PO CPDR: 20.0000 mg | DELAYED_RELEASE_CAPSULE | Freq: Two times a day (BID) | ORAL | 5 refills | Status: DC

## 2017-12-07 NOTE — Assessment & Plan Note (Signed)
Persistent nausea, rare vomiting.  Phenergan helps her symptoms significantly.  Recommend she continue her Phenergan that she is currently prescribed.  Nausea likely related to chronic pancreatitis.  Possible element of GERD being that her GERD symptoms are a bit exacerbated currently.  Further GERD management as per below.  Follow-up in 3 months.

## 2017-12-07 NOTE — Assessment & Plan Note (Signed)
Chronic abdominal pain failed celiac nerve block at [facility].  They feel her abdominal pain is likely due to her chronic pancreatitis.  Unfortunately, there is nothing further to offer her related to pancreatitis specific pain management.  They recommended adding gabapentin to her regimen which is been done but is not helping.  She has a follow-up with her primary care in a week for chronic pain and discuss pain management long-term.  Follow-up with our office in 3 months.

## 2017-12-07 NOTE — Patient Instructions (Signed)
1. Increase your Prilosec to twice a day. 2. I sent in a new prescription so that when you run out he will have enough medication. 3. Follow-up with your primary care related to chronic pain management. 4. Follow-up with [place] in 3 months. 5. Call if you have any questions or concerns.

## 2017-12-07 NOTE — Assessment & Plan Note (Signed)
Chronic GERD, states it is worse recently.  She is currently on Prilosec 20 mg daily.  I will increase this to twice a day.  Follow-up in 3 months.

## 2017-12-07 NOTE — Progress Notes (Signed)
CC'ED TO PCP 

## 2017-12-07 NOTE — Assessment & Plan Note (Signed)
The patient has always been small frame, stating her peak weight was 100 pounds.  She has some weight loss although she is currently stable around 76 pounds.  Her BMI is low at 14.  I recommended a consult with dietitian for recommendations to maintain/increase weight.  She declined at this time.  Continue to eat nutritious meals, supplements such as Ensure or boost as tolerated.  Follow-up in 3 months.

## 2017-12-07 NOTE — Assessment & Plan Note (Addendum)
History of idiopathic chronic pancreatitis.  She has had worsening pain, nausea, vomiting.  Her primary care is concerned about increasing her pain medications.  She exhausted resources locally and was referred to [facility]  They attempted a celiac nerve block but were unable to due to her small frame and limited adipose tissue inferring increased risk of complication.  Per the patient, she was told there is nothing further they can do for her either.  Recommended adding gabapentin to her pain regimen.  This was done and is not helping her symptoms.  She has a follow-up appointment with primary care in a week to discuss her chronic pain management.  At this point we are likely limited in what can be done.  She will likely need chronic pain medications and possibly a referral to pain management.  She is currently on pancreatic enzymes and the dosing was checked and is adequate for her body weight.  Interestingly, her sister also has chronic pancreatitis which the patient states is more severe than her case requiring feeding tube, frequent hospitalization.  Her sister was told it is likely genetic.  Follow-up in our office in 3 months.  Call if any questions or concerns.

## 2017-12-07 NOTE — Progress Notes (Signed)
Referring Provider: Lemmie Evens, MD Primary Care Physician:  Lemmie Evens, MD Primary GI:  Dr. Gala Romney  Chief Complaint  Patient presents with  . Abdominal Pain  . Nausea  . Emesis  . Gastroesophageal Reflux  . Diarrhea    HPI:   [name] is a 72 y.o. female who presents for follow-up on abdominal pain, nausea, GERD, constipation.  The patient was last seen in our office 06/09/2017 for right upper quadrant pain, drug-induced constipation, idiopathic chronic pancreatitis, nausea/vomiting, diarrhea.  Colonoscopy up-to-date next due in 2023. CT angiogram completed 05/12/2017 which found celiac artery patent, SMA patent, renal , IMA patent. Impression of no significant narrowing in the Desyrel vasculature, no acute vascular pathology. Subtle complex solid and cystic mass in the head of the pancreas with calcifications dating back to 2009 which is likely abnormality related to chronic pancreatitis and local duct dilation.  She has a history of idiopathic chronic pancreatitis with worsening pain and reduction of opioids.  Having significant constipation historically, chronic abdominal pain for over 20 years.  She noted at her last visit continued chronic pain in multiple sites including back, right upper quadrant, and others.  Still with nausea and Phenergan helps.  Episode of emesis but no hematemesis.  On Linzess 145 mcg going twice a week rather than once a week but occasionally 4-5 times in a day.  Brief, self-limiting episode of diarrhea recently.  No other GI symptoms.  Recommend continue current medications, she was referred to [facility], follow-up in 6 months.  She was seen at [facility] and was worked up.  Deemed idiopathic chronic pancreatitis with a small focal calcified solid cystic mass in HOPE unchanged since 2009, intractable abdominal pain with chronic narcotic dependence, chronic constipation, moderate protein calorie malnutrition.  Advised to reduce smoking,  continue current medications, referred for EUS with possible celiac plexus block for some pain relief.  They felt her pain was consistent with chronic pancreatitis.  However, due to body habitus and extremely little retroperitoneal fat the celiac plexus block window was very poor and it was declined to be performed.  Recommended adding gabapentin to her regimen.  Today she states she's still having chronic pain. Saw [facility] and they couldn't do the nerve block. She was told there's nothing additional they can do, referred back to PCP for chronic pain management. Addition of Gabapentin has not helped. She has a follow-up with PCP in about 1 week. GERD symptoms worse currently, doesn't want to eat because of it. Has consistent nausea, phenergan helps and prevents vomiting. Still with diarrhea, has it about 1-2 times a week; when it occurs she has 1-2 episodes; typically occurs when she is very nauseated or having abdominal pain. Imodium helps her diarrhea; has never tried diarrhea. Persistent, often "severe" right-sided abdominal pain radiating to her back. Her sister also has chronic pancreatitis and requires a feeding tube. Chronic chest pain for which she takes nitroglycerin. Denies dyspnea, dizziness, lightheadedness, syncope, near syncope. Denies any other upper or lower GI symptoms.  Past Medical History:  Diagnosis Date  . Allergic rhinitis   . Anxiety   . Anxiety and depression   . Back pain, chronic   . CAD (coronary artery disease)    palpitations, dizziness, chest pain  . Cervical cancer (HCC)    cervical  . Chronic abdominal pain   . COPD (chronic obstructive pulmonary disease) ([place])   . Depression   . GERD (gastroesophageal reflux disease)   . Helicobacter pylori infection  2015   treated with prevpac  . Hematochezia   . History of kidney stones   . Hx of colonic polyps    adenomatous  . Hyperlipidemia    Lipid profile on 02/25/2011: 209, 113, 55, 132  . Nephrolithiasis   .  Pancreatitis chronic   . Renal calculus   . Stroke [facility]) 3-4 yrs ago   left sided weakness  . Tobacco abuse   . Tubular adenoma 2015  . Weight loss    CT negative for occult malignancy, negative celiac, negative adrenal insufficiency    Past Surgical History:  Procedure Laterality Date  . ABDOMINAL HYSTERECTOMY    . APPENDECTOMY    . CATARACT EXTRACTION W/PHACO Right 05/04/2013   Procedure: CATARACT EXTRACTION PHACO AND INTRAOCULAR LENS PLACEMENT (IOC);  Surgeon: Tonny Branch, MD;  Location: [facility];  Service: Ophthalmology;  Laterality: Right;  CDE:16.17  . CATARACT EXTRACTION W/PHACO Left 05/22/2013   Procedure: CATARACT EXTRACTION PHACO AND INTRAOCULAR LENS PLACEMENT (IOC);  Surgeon: Tonny Branch, MD;  Location: [facility];  Service: Ophthalmology;  Laterality: Left;  CDE:14.75  . COLONOSCOPY N/A 02/08/2014   Dr.Rourk- normal rectum, colonic polyps bx=tubular adenoma  . COLONOSCOPY W/ POLYPECTOMY  2006   UXN:[id number], benign gastric nodule, multiple adenomatous polyps, one with tubular morphology  . COLONOSCOPY WITH PROPOFOL N/A 04/15/2017   Procedure: COLONOSCOPY WITH PROPOFOL;  Surgeon: Daneil Dolin, MD;  Location: [facility] SUITE;  Service: Endoscopy;  Laterality: N/A;  11:15am  . ESOPHAGOGASTRODUODENOSCOPY  2006   [facility]:CWCBJS gastric nodule  . ESOPHAGOGASTRODUODENOSCOPY N/A 02/08/2014   Dr.Rourk- abnormal stomach and gstric nodule bx= hpylori  . EUS  2010   Dr. Ardis Hughs : Multiple shadowing calcifications in pancreas, consistent with Chronic Pancreatitis.  These are mainly confined to a collection of calcifications in head/uncinate pancreas. Otherwise the pancreatic parenchyma appears fairly normal.  The main pancreatic duct, CBD are both normal without stones, dilation.    . Ileocolonoscopy  01/11/2009   RMR: Polyp at the splenic flexure, status post hot snare removal/ Normal rectum, tubular adenoma  . PARTIAL HYSTERECTOMY      Current Outpatient Medications  Medication Sig Dispense  Refill  . acetaminophen (TYLENOL) 325 MG tablet Take 650 mg by mouth every 6 (six) hours as needed for mild pain.    Marland Kitchen ADVAIR DISKUS 250-50 MCG/DOSE AEPB Inhale 1 puff into the lungs 2 (two) times daily.     . Calcium Carbonate (CALCIUM 600 PO) Take 1 tablet by mouth daily.      . cilostazol (PLETAL) 100 MG tablet Take 100 mg by mouth 2 (two) times daily.     Marland Kitchen CREON 24000-76000 units CPEP TAKE 2 CAPSULES WITH EACH MEALS AND 1 CAPSULE WITH SNACKS. 240 capsule 5  . DULoxetine (CYMBALTA) 60 MG capsule Take 60 mg by mouth 2 (two) times daily.     Marland Kitchen escitalopram (LEXAPRO) 10 MG tablet Take 10 mg by mouth daily.    Marland Kitchen gabapentin (NEURONTIN) 100 MG capsule Take 100 mg by mouth 3 (three) times daily.    . hydrOXYzine (ATARAX/VISTARIL) 25 MG tablet Take 1 tablet by mouth 3 (three) times daily as needed for anxiety or itching.     Marland Kitchen ipratropium (ATROVENT) 0.06 % nasal spray Place 2 sprays into both nostrils 2 (two) times daily as needed for rhinitis.    Marland Kitchen LINZESS 145 MCG CAPS capsule Take 1 capsule by mouth daily.    Marland Kitchen LORazepam (ATIVAN) 1 MG tablet Take 1 mg by mouth every 8 (eight)  hours as needed for anxiety. Reported on 11/19/2015    . nitroGLYCERIN (NITROSTAT) 0.4 MG SL tablet Place 1 tablet (0.4 mg total) under the tongue every 5 (five) minutes as needed. 25 tablet 3  . omeprazole (PRILOSEC) 20 MG capsule TAKE 1 CAPSULE BY MOUTH ONCE DAILY. 30 capsule 11  . oxyCODONE-acetaminophen (PERCOCET/ROXICET) 5-325 MG tablet Take 1 tablet by mouth every 6 (six) hours as needed for severe pain.    . potassium chloride 20 MEQ TBCR Take 20 mEq by mouth daily. 2 tablet 0  . PROAIR HFA 108 (90 Base) MCG/ACT inhaler Inhale 2 puffs into the lungs every 4 (four) hours as needed for shortness of breath or wheezing.    . promethazine (PHENERGAN) 25 MG tablet Take 25 mg by mouth every 6 (six) hours as needed for nausea or vomiting.     . RESTASIS 0.05 % ophthalmic emulsion Place 1 drop into both eyes 2 (two) times daily.     . Vitamin D, Ergocalciferol, (DRISDOL) 50000 units CAPS capsule Take 50,000 Units by mouth every 7 (seven) days.    . CHANTIX STARTING MONTH PAK 0.5 MG X 11 & 1 MG X 42 tablet Take 1 tablet by mouth 2 (two) times daily.     No current facility-administered medications for this visit.     Allergies as of 12/07/2017 - Review Complete 12/07/2017  Allergen Reaction Noted  . Ibuprofen Anaphylaxis and Hives 12/19/2008  . Iohexol Hives and Swelling 05/23/2004  . Strawberry (diagnostic) Itching 11/09/2015    Family History  Problem Relation Age of Onset  . Heart attack Father 69  . Colon cancer Maternal Grandmother     Social History   Socioeconomic History  . Marital status: Divorced    Spouse name: None  . Number of children: 3  . Years of education: None  . Highest education level: None  Social Needs  . Financial resource strain: None  . Food insecurity - worry: None  . Food insecurity - inability: None  . Transportation needs - medical: None  . Transportation needs - non-medical: None  Occupational History  . Occupation: disabled    Fish farm manager: UNEMPLOYED  Tobacco Use  . Smoking status: Current Every Day Smoker    Packs/day: 0.50    Years: 50.00    Pack years: 25.00    Types: Cigarettes    Start date: 07/18/1962  . Smokeless tobacco: Never Used  . Tobacco comment: 3/4 pack daily  Substance and Sexual Activity  . Alcohol use: No    Alcohol/week: 0.0 oz    Comment: hx of ETOH about 20 years ago.   . Drug use: No  . Sexual activity: No    Birth control/protection: Surgical  Other Topics Concern  . None  Social History Narrative  . None    Review of Systems: General: Negative for anorexia, weight loss, fever, chills, fatigue, weakness. ENT: Negative for hoarseness, difficulty swallowing , nasal congestion. CV: Negative for chest pain, angina, palpitations, dyspnea on exertion, peripheral edema.  Respiratory: Negative for dyspnea at rest, dyspnea on exertion,  cough, sputum, wheezing.  GI: See history of present illness. MS: Admits chronic pain.  Derm: Negative for rash or itching.  Endo: Negative for unusual weight change.    Physical Exam: BP 124/72   Pulse 81   Temp (!) 97 F (36.1 C) (Oral)   Ht 5\' 2"  (1.575 m)   Wt 76 lb 9.6 oz (34.7 kg)   BMI 14.01 kg/m  General:  Thin female. Alert and oriented. Pleasant and cooperative. Well-nourished and well-developed.  Eyes:  Without icterus, sclera clear and conjunctiva pink.  Ears:  Normal auditory acuity. Cardiovascular:  S1, S2 present without murmurs appreciated. Extremities without clubbing or edema. Respiratory:  Clear to auscultation bilaterally. No wheezes, rales, or rhonchi. No distress.  Gastrointestinal:  +BS, soft, non-tender and non-distended. No HSM noted. No guarding or rebound. No masses appreciated.  Rectal:  Deferred  Musculoskalatal:  Symmetrical without gross deformities. Neurologic:  Alert and oriented x4;  grossly normal neurologically. Psych:  Alert and cooperative. Normal mood and affect.    12/07/2017 11:43 AM   Disclaimer: This note was dictated with voice recognition software. Similar sounding words can inadvertently be transcribed and may not be corrected upon review.

## 2017-12-20 ENCOUNTER — Ambulatory Visit (INDEPENDENT_AMBULATORY_CARE_PROVIDER_SITE_OTHER): Payer: Medicare Other | Admitting: Otolaryngology

## 2017-12-20 DIAGNOSIS — K219 Gastro-esophageal reflux disease without esophagitis: Secondary | ICD-10-CM | POA: Diagnosis not present

## 2017-12-20 DIAGNOSIS — F17210 Nicotine dependence, cigarettes, uncomplicated: Secondary | ICD-10-CM | POA: Diagnosis not present

## 2017-12-20 DIAGNOSIS — H9209 Otalgia, unspecified ear: Secondary | ICD-10-CM | POA: Diagnosis not present

## 2017-12-20 DIAGNOSIS — R490 Dysphonia: Secondary | ICD-10-CM

## 2017-12-29 ENCOUNTER — Other Ambulatory Visit: Payer: Self-pay | Admitting: Nurse Practitioner

## 2018-01-17 ENCOUNTER — Emergency Department (HOSPITAL_COMMUNITY)
Admission: EM | Admit: 2018-01-17 | Discharge: 2018-01-18 | Disposition: A | Discharge status: Home / Self Care | Payer: Medicare Other | Attending: Emergency Medicine | Admitting: Emergency Medicine

## 2018-01-17 DIAGNOSIS — F17210 Nicotine dependence, cigarettes, uncomplicated: Secondary | ICD-10-CM | POA: Diagnosis not present

## 2018-01-17 DIAGNOSIS — I2510 Atherosclerotic heart disease of native coronary artery without angina pectoris: Secondary | ICD-10-CM | POA: Diagnosis not present

## 2018-01-17 DIAGNOSIS — Z79899 Other long term (current) drug therapy: Secondary | ICD-10-CM | POA: Insufficient documentation

## 2018-01-17 DIAGNOSIS — Z8541 Personal history of malignant neoplasm of cervix uteri: Secondary | ICD-10-CM | POA: Insufficient documentation

## 2018-01-17 DIAGNOSIS — J449 Chronic obstructive pulmonary disease, unspecified: Secondary | ICD-10-CM | POA: Diagnosis not present

## 2018-01-17 DIAGNOSIS — N390 Urinary tract infection, site not specified: Secondary | ICD-10-CM | POA: Insufficient documentation

## 2018-01-17 DIAGNOSIS — Z8673 Personal history of transient ischemic attack (TIA), and cerebral infarction without residual deficits: Secondary | ICD-10-CM | POA: Diagnosis not present

## 2018-01-17 DIAGNOSIS — R109 Unspecified abdominal pain: Secondary | ICD-10-CM | POA: Diagnosis present

## 2018-01-17 MED ORDER — SODIUM CHLORIDE 0.9 % IV BOLUS
1000.0000 mL | Freq: Once | INTRAVENOUS | Status: AC
  Administered 2018-01-18: 1000 mL via INTRAVENOUS

## 2018-01-17 MED ORDER — MORPHINE SULFATE (PF) 4 MG/ML IV SOLN
4.0000 mg | Freq: Once | INTRAVENOUS | Status: AC
  Administered 2018-01-18: 4 mg via INTRAVENOUS
  Filled 2018-01-17: qty 1

## 2018-01-17 MED ORDER — ONDANSETRON HCL 4 MG/2ML IJ SOLN
4.0000 mg | Freq: Once | INTRAMUSCULAR | Status: AC
  Administered 2018-01-18: 4 mg via INTRAVENOUS
  Filled 2018-01-17: qty 2

## 2018-01-17 NOTE — ED Provider Notes (Signed)
[facility] EMERGENCY DEPARTMENT Provider Note   CSN: [id number] Arrival date & time: 01/17/18  1729     History   Chief Complaint Chief Complaint  Patient presents with  . Flank Pain    HPI [name] is a 72 y.o. female.  HPI  This is a 72 year old female with multiple medical problems including coronary artery disease, cervical cancer, COPD, hyperlipidemia, chronic pancreatitis who presents with lower abdominal pain and right flank pain.  She reports symptoms over the last week.  She reports urgency and dysuria.  She reports "dribbling."  She initially had some right lower abdominal discomfort but now is having right flank pain.  Rates her pain 8 out of 10.  She has been taking her pain medications at home with minimal relief.  She states that this does not feel like her pancreatitis.  She reports nausea without vomiting or diarrhea.  She has noted some hematuria.  Past Medical History:  Diagnosis Date  . Allergic rhinitis   . Anxiety   . Anxiety and depression   . Back pain, chronic   . CAD (coronary artery disease)    palpitations, dizziness, chest pain  . Cervical cancer (HCC)    cervical  . Chronic abdominal pain   . COPD (chronic obstructive pulmonary disease) ([place])   . Depression   . GERD (gastroesophageal reflux disease)   . Helicobacter pylori infection 2015   treated with prevpac  . Hematochezia   . History of kidney stones   . Hx of colonic polyps    adenomatous  . Hyperlipidemia    Lipid profile on 02/25/2011: 209, 113, 55, 132  . Nephrolithiasis   . Pancreatitis chronic   . Renal calculus   . Stroke [facility] 3-4 yrs ago   left sided weakness  . Tobacco abuse   . Tubular adenoma 2015  . Weight loss    CT negative for occult malignancy, negative celiac, negative adrenal insufficiency    Patient Active Problem List   Diagnosis Date Noted  . Diarrhea 06/09/2017  . Colitis 07/01/2016  . RUQ pain 10/19/2012  . Nausea with vomiting 05/07/2012    . Constipation 05/07/2012  . Laboratory test 02/17/2012  . Hyperlipidemia   . Chest pain 01/07/2011  . ANOREXIA 05/27/2010  . WEIGHT LOSS 05/27/2010  . COLONIC POLYPS, ADENOMATOUS, HX OF 12/20/2008  . ANXIETY DEPRESSION 12/19/2008  . Tobacco abuse 12/19/2008  . ALLERGIC RHINITIS, SEASONAL 12/19/2008  . COPD 12/19/2008  . GERD 12/19/2008  . Chronic pancreatitis ([place]) 12/19/2008  . BACK PAIN, CHRONIC 12/19/2008  . Abdominal pain 12/19/2008  . CERVICAL CANCER, HX OF 12/19/2008  . Nephrolithiasis 12/19/2008    Past Surgical History:  Procedure Laterality Date  . ABDOMINAL HYSTERECTOMY    . APPENDECTOMY    . CATARACT EXTRACTION W/PHACO Right 05/04/2013   Procedure: CATARACT EXTRACTION PHACO AND INTRAOCULAR LENS PLACEMENT (IOC);  Surgeon: Tonny Branch, MD;  Location: [facility] ORS;  Service: Ophthalmology;  Laterality: Right;  CDE:16.17  . CATARACT EXTRACTION W/PHACO Left 05/22/2013   Procedure: CATARACT EXTRACTION PHACO AND INTRAOCULAR LENS PLACEMENT (IOC);  Surgeon: Tonny Branch, MD;  Location: [facility] ORS;  Service: Ophthalmology;  Laterality: Left;  CDE:14.75  . COLONOSCOPY N/A 02/08/2014   Dr.Rourk- normal rectum, colonic polyps bx=tubular adenoma  . COLONOSCOPY W/ POLYPECTOMY  2006   DDU:[id number], benign gastric nodule, multiple adenomatous polyps, one with tubular morphology  . COLONOSCOPY WITH PROPOFOL N/A 04/15/2017   Procedure: COLONOSCOPY WITH PROPOFOL;  Surgeon: Daneil Dolin, MD;  Location: [facility] END[facility] SUITE;  Service: Endoscopy;  Laterality: N/A;  11:15am  . ESOPHAGOGASTRODUODENOSCOPY  2006   TIW:PYKDXI gastric nodule  . ESOPHAGOGASTRODUODENOSCOPY N/A 02/08/2014   Dr.Rourk- abnormal stomach and gstric nodule bx= hpylori  . EUS  2010   Dr. Ardis Hughs : Multiple shadowing calcifications in pancreas, consistent with Chronic Pancreatitis.  These are mainly confined to a collection of calcifications in head/uncinate pancreas. Otherwise the pancreatic parenchyma appears fairly normal.  The main  pancreatic duct, CBD are both normal without stones, dilation.    . Ileocolonoscopy  01/11/2009   RMR: Polyp at the splenic flexure, status post hot snare removal/ Normal rectum, tubular adenoma  . PARTIAL HYSTERECTOMY       OB History    Gravida  6   Para  3   Term  3   Preterm      AB  3   Living        SAB  3   TAB      Ectopic      Multiple      Live Births               Home Medications    Prior to Admission medications   Medication Sig Start Date End Date Taking? Authorizing Provider  acetaminophen (TYLENOL) 325 MG tablet Take 650 mg by mouth every 6 (six) hours as needed for mild pain.    [provider]  ADVAIR DISKUS 250-50 MCG/DOSE AEPB Inhale 1 puff into the lungs 2 (two) times daily.  10/05/12   [provider]  Calcium Carbonate (CALCIUM 600 PO) Take 1 tablet by mouth daily.      [provider]  cephALEXin (KEFLEX) 500 MG capsule Take 1 capsule (500 mg total) by mouth 3 (three) times daily. 01/18/18   Arif Amendola, Barbette Hair, MD  cilostazol (PLETAL) 100 MG tablet Take 100 mg by mouth 2 (two) times daily.  03/29/15   [provider]  CREON 24000-76000 units CPEP TAKE 2 CAPSULES WITH EACH MEALS AND 1 CAPSULE WITH SNACKS. 12/30/17   Mahala Menghini, PA-C  DULoxetine (CYMBALTA) 60 MG capsule Take 60 mg by mouth 2 (two) times daily.     [provider]  escitalopram (LEXAPRO) 10 MG tablet Take 10 mg by mouth daily. 01/30/17   [provider]  gabapentin (NEURONTIN) 100 MG capsule Take 100 mg by mouth 3 (three) times daily. 11/16/17   [provider]  hydrOXYzine (ATARAX/VISTARIL) 25 MG tablet Take 1 tablet by mouth 3 (three) times daily as needed for anxiety or itching.  04/21/16   [provider]  ipratropium (ATROVENT) 0.06 % nasal spray Place 2 sprays into both nostrils 2 (two) times daily as needed for rhinitis.    [provider]  LINZESS 145 MCG CAPS capsule Take 1 capsule by mouth  daily. 04/16/17   [provider]  LORazepam (ATIVAN) 1 MG tablet Take 1 mg by mouth every 8 (eight) hours as needed for anxiety. Reported on 11/19/2015    [provider]  nitroGLYCERIN (NITROSTAT) 0.4 MG SL tablet Place 1 tablet (0.4 mg total) under the tongue every 5 (five) minutes as needed. 11/21/14   Herminio Commons, MD  omeprazole (PRILOSEC) 20 MG capsule Take 1 capsule (20 mg total) by mouth 2 (two) times daily before a meal. 12/07/17   Carlis Stable, NP  oxyCODONE-acetaminophen (PERCOCET/ROXICET) 5-325 MG tablet Take 1 tablet by mouth every 6 (six) hours as needed for severe pain.  [provider]  potassium chloride 20 MEQ TBCR Take 20 mEq by mouth daily. 04/13/17 12/07/17  Carlis Stable, NP  PROAIR HFA 108 [phone] Base) MCG/ACT inhaler Inhale 2 puffs into the lungs every 4 (four) hours as needed for shortness of breath or wheezing. 01/30/17   [provider]  promethazine (PHENERGAN) 25 MG tablet Take 25 mg by mouth every 6 (six) hours as needed for nausea or vomiting.     [provider]  RESTASIS 0.05 % ophthalmic emulsion Place 1 drop into both eyes 2 (two) times daily. 03/27/16   [provider]  Vitamin D, Ergocalciferol, (DRISDOL) 50000 units CAPS capsule Take 50,000 Units by mouth every 7 (seven) days.    [provider]    Family History Family History  Problem Relation Age of Onset  . Heart attack Father 53  . Colon cancer Maternal Grandmother     Social History Social History   Tobacco Use  . Smoking status: Current Every Day Smoker    Packs/day: 0.50    Years: 50.00    Pack years: 25.00    Types: Cigarettes    Start date: 07/18/1962  . Smokeless tobacco: Never Used  . Tobacco comment: 3/4 pack daily  Substance Use Topics  . Alcohol use: No    Alcohol/week: 0.0 oz    Comment: hx of ETOH about 20 years ago.   . Drug use: No     Allergies   Ibuprofen; Iohexol; and Strawberry (diagnostic)   Review of  Systems Review of Systems  Constitutional: Negative for fever.  Respiratory: Positive for shortness of breath.   Cardiovascular: Negative for chest pain.  Gastrointestinal: Positive for abdominal pain and nausea. Negative for constipation, diarrhea and vomiting.  Genitourinary: Positive for dysuria, flank pain and hematuria.  All other systems reviewed and are negative.    Physical Exam Updated Vital Signs BP 111/82 (BP Location: Left Arm)   Pulse 68   Temp 97.9 F (36.6 C) (Oral)   Resp 18   SpO2 98%   Physical Exam  Constitutional: She is oriented to person, place, and time.  Thin, frail, no acute distress  HENT:  Head: Normocephalic and atraumatic.  Cardiovascular: Normal rate, regular rhythm and normal heart sounds.  No murmur heard. Pulmonary/Chest: Effort normal. No respiratory distress. She has wheezes.  Slight wheeze, fair air movement  Abdominal: Soft. Bowel sounds are normal. She exhibits no mass. There is no tenderness. There is no guarding.  Genitourinary:  Genitourinary Comments: Positive right CVA tenderness  Neurological: She is alert and oriented to person, place, and time.  Skin: Skin is warm and dry.  Psychiatric: She has a normal mood and affect.  Nursing note and vitals reviewed.    ED Treatments / Results  Labs (all labs ordered are listed, but only abnormal results are displayed) Labs Reviewed  URINALYSIS, ROUTINE W REFLEX MICROSCOPIC - Abnormal; Notable for the following components:      Result Value   Color, Urine STRAW (*)    Hgb urine dipstick MODERATE (*)    Leukocytes, UA MODERATE (*)    All other components within normal limits  CBC WITH DIFFERENTIAL/PLATELET - Abnormal; Notable for the following components:   RBC 3.75 (*)    Hemoglobin 11.8 (*)    HCT 34.4 (*)    All other components within normal limits  COMPREHENSIVE METABOLIC PANEL - Abnormal; Notable for the following components:   Total Protein 6.3 (*)    AST 13 (*)  ALT 7  (*)    All other components within normal limits  URINE CULTURE  LIPASE, BLOOD    EKG None  Radiology Ct Renal Stone Study  Result Date: 01/18/2018 CLINICAL DATA:  Acute onset of right scapular pain. Right flank and pelvic pain. Hematuria. Nausea. EXAM: CT ABDOMEN AND PELVIS WITHOUT CONTRAST TECHNIQUE: Multidetector CT imaging of the abdomen and pelvis was performed following the standard protocol without IV contrast. COMPARISON:  CT of the abdomen and pelvis performed 05/12/2017 FINDINGS: Lower chest: Emphysema is noted at the lung bases. Hepatobiliary: The liver is unremarkable in appearance. The gallbladder is unremarkable in appearance. The common bile duct remains normal in caliber. Pancreas: Scattered calcifications at the pancreatic head likely reflect sequelae of chronic pancreatitis. There is persistent dilatation of the pancreatic duct to 5 mm, with a 4 mm stone again noted at the distal pancreatic duct. The pancreas is otherwise unremarkable in appearance. Spleen: The spleen is unremarkable in appearance. Adrenals/Urinary Tract: The adrenal glands are unremarkable in appearance. There is question of minimal haziness about the right renal pelvis. There is no evidence of hydronephrosis. No renal or ureteral stones are identified. No perinephric stranding is seen. Stomach/Bowel: The stomach is unremarkable in appearance. The small bowel is within normal limits. The patient is status post appendectomy. The colon is unremarkable in appearance. Vascular/Lymphatic: Diffuse calcification is seen along the abdominal aorta and its branches. The abdominal aorta is otherwise grossly unremarkable. The inferior vena cava is grossly unremarkable. No retroperitoneal lymphadenopathy is seen. No pelvic sidewall lymphadenopathy is identified. Reproductive: The bladder is mildly distended and grossly unremarkable. The patient is status post hysterectomy. No suspicious adnexal masses are seen. Other: No additional  soft tissue abnormalities are seen. Musculoskeletal: No acute osseous abnormalities are identified. The visualized musculature is unremarkable in appearance. IMPRESSION: 1. Question of minimal haziness about the right renal pelvis, without evidence of hydronephrosis. Would correlate clinically for evidence of ureteritis. 2. Scattered calcifications at the pancreatic head likely reflect sequelae of chronic pancreatitis. 3. Persistent dilatation of the pancreatic duct to 5 mm, with a 4 mm stone again noted at the distal pancreatic duct. 4. Emphysema at the lung bases. Aortic Atherosclerosis (ICD10-I70.0). Electronically Signed   By: Garald Balding M.D.   On: 01/18/2018 00:59    Procedures Procedures (including critical care time)  Medications Ordered in ED Medications  morphine 4 MG/ML injection 4 mg (4 mg Intravenous Given 01/18/18 0023)  ondansetron (ZOFRAN) injection 4 mg (4 mg Intravenous Given 01/18/18 0023)  sodium chloride 0.9 % bolus 1,000 mL (0 mLs Intravenous Stopped 01/18/18 0053)     Initial Impression / Assessment and Plan / ED Course  I have reviewed the triage vital signs and the nursing notes.  Pertinent labs & imaging results that were available during my care of the patient were reviewed by me and considered in my medical decision making (see chart for details).     Presents with right flank pain and urinary symptoms.  She is overall nontoxic appearing.  Chronically ill-appearing.  Afebrile vital signs are reassuring.  Considerations include UTI, pyelonephritis, kidney stones.  Less likely pancreatitis or appendicitis.  Patient was given fluids and Zofran.  Lab work-up is largely reassuring with.  CT stone study obtained and is concerning for some inflammation concerning for urethritis or pyelonephritis.  Given dysuria and urgency, would like to treat.  Urine culture was sent.  Patient has remained systemically well-appearing.  Will discharge home with Keflex and follow-up with  primary physician.  After history, exam, and medical workup I feel the patient has been appropriately medically screened and is safe for discharge home. Pertinent diagnoses were discussed with the patient. Patient was given return precautions.   Final Clinical Impressions(s) / ED Diagnoses   Final diagnoses:  Flank pain  Lower urinary tract infectious disease    ED Discharge Orders        Ordered    cephALEXin (KEFLEX) 500 MG capsule  3 times daily     01/18/18 0346       Darinda [place], Barbette Hair, MD 01/18/18 [phone]

## 2018-01-17 NOTE — ED Triage Notes (Signed)
Pt c/o pain to right shoulder blade, right flank and pelvic area that started on Friday. Pt also c/o blood in her urine. Pt reports nausea, but reports this is her normal.

## 2018-01-18 ENCOUNTER — Emergency Department (HOSPITAL_COMMUNITY): Payer: Medicare Other

## 2018-01-18 DIAGNOSIS — R109 Unspecified abdominal pain: Secondary | ICD-10-CM | POA: Diagnosis not present

## 2018-01-18 LAB — CBC WITH DIFFERENTIAL/PLATELET
BASOS PCT: 0 %
Basophils Absolute: 0 10*3/uL (ref 0.0–0.1)
Eosinophils Absolute: 0.1 10*3/uL (ref 0.0–0.7)
Eosinophils Relative: 1 %
HEMATOCRIT: 34.4 % — AB (ref 36.0–46.0)
HEMOGLOBIN: 11.8 g/dL — AB (ref 12.0–15.0)
LYMPHS ABS: 2.8 10*3/uL (ref 0.7–4.0)
Lymphocytes Relative: 43 %
MCH: 31.5 pg (ref 26.0–34.0)
MCHC: 34.3 g/dL (ref 30.0–36.0)
MCV: 91.7 fL (ref 78.0–100.0)
MONOS PCT: 7 %
Monocytes Absolute: 0.4 10*3/uL (ref 0.1–1.0)
NEUTROS PCT: 49 %
Neutro Abs: 3.2 10*3/uL (ref 1.7–7.7)
Platelets: 231 10*3/uL (ref 150–400)
RBC: 3.75 MIL/uL — AB (ref 3.87–5.11)
RDW: 13.1 % (ref 11.5–15.5)
WBC: 6.6 10*3/uL (ref 4.0–10.5)

## 2018-01-18 LAB — COMPREHENSIVE METABOLIC PANEL
ALBUMIN: 3.6 g/dL (ref 3.5–5.0)
ALK PHOS: 43 U/L (ref 38–126)
ALT: 7 U/L — AB (ref 14–54)
ANION GAP: 8 (ref 5–15)
AST: 13 U/L — ABNORMAL LOW (ref 15–41)
BILIRUBIN TOTAL: 0.5 mg/dL (ref 0.3–1.2)
BUN: 14 mg/dL (ref 6–20)
CALCIUM: 9.3 mg/dL (ref 8.9–10.3)
CO2: 28 mmol/L (ref 22–32)
CREATININE: 0.47 mg/dL (ref 0.44–1.00)
Chloride: 104 mmol/L (ref 101–111)
GFR calc Af Amer: >60 mL/min (ref >60)
GFR calc non Af Amer: >60 mL/min (ref >60)
GLUCOSE: 86 mg/dL (ref 65–99)
Potassium: 3.6 mmol/L (ref 3.5–5.1)
SODIUM: 140 mmol/L (ref 135–145)
TOTAL PROTEIN: 6.3 g/dL — AB (ref 6.5–8.1)

## 2018-01-18 LAB — URINALYSIS, ROUTINE W REFLEX MICROSCOPIC
BACTERIA UA: NONE SEEN
Bilirubin Urine: NEGATIVE
Glucose, UA: NEGATIVE mg/dL
KETONES UR: NEGATIVE mg/dL
Nitrite: NEGATIVE
PH: 6 (ref 5.0–8.0)
Protein, ur: NEGATIVE mg/dL
Specific Gravity, Urine: 1.008 (ref 1.005–1.030)

## 2018-01-18 LAB — LIPASE, BLOOD: Lipase: 31 U/L (ref 11–51)

## 2018-01-18 MED ORDER — CEPHALEXIN 500 MG PO CAPS: 500.0000 mg | ORAL_CAPSULE | Freq: Three times a day (TID) | ORAL | 0 refills | Status: DC

## 2018-01-18 NOTE — Discharge Instructions (Addendum)
He was seen today for right flank pain.  Your CT scan shows changes that might be consistent with urinary tract infection.  While your urine does not appear dirty, given your symptoms, will treat.  If not improving you need to follow-up closely with your primary physician.

## 2018-01-20 ENCOUNTER — Encounter: Payer: Self-pay | Admitting: Internal Medicine

## 2018-01-20 LAB — URINE CULTURE

## 2018-02-12 ENCOUNTER — Other Ambulatory Visit: Payer: Self-pay

## 2018-02-12 ENCOUNTER — Encounter (HOSPITAL_COMMUNITY): Payer: Self-pay | Admitting: Emergency Medicine

## 2018-02-12 ENCOUNTER — Emergency Department (HOSPITAL_COMMUNITY): Payer: Medicare Other

## 2018-02-12 ENCOUNTER — Emergency Department (HOSPITAL_COMMUNITY)
Admission: EM | Admit: 2018-02-12 | Discharge: 2018-02-12 | Disposition: A | Discharge status: Home / Self Care | Payer: Medicare Other | Attending: Emergency Medicine | Admitting: Emergency Medicine

## 2018-02-12 DIAGNOSIS — Z8673 Personal history of transient ischemic attack (TIA), and cerebral infarction without residual deficits: Secondary | ICD-10-CM | POA: Diagnosis not present

## 2018-02-12 DIAGNOSIS — Z79899 Other long term (current) drug therapy: Secondary | ICD-10-CM | POA: Diagnosis not present

## 2018-02-12 DIAGNOSIS — M6281 Muscle weakness (generalized): Secondary | ICD-10-CM | POA: Diagnosis present

## 2018-02-12 DIAGNOSIS — F17210 Nicotine dependence, cigarettes, uncomplicated: Secondary | ICD-10-CM | POA: Diagnosis not present

## 2018-02-12 DIAGNOSIS — N3090 Cystitis, unspecified without hematuria: Secondary | ICD-10-CM | POA: Insufficient documentation

## 2018-02-12 DIAGNOSIS — R531 Weakness: Secondary | ICD-10-CM

## 2018-02-12 DIAGNOSIS — J441 Chronic obstructive pulmonary disease with (acute) exacerbation: Secondary | ICD-10-CM | POA: Diagnosis not present

## 2018-02-12 DIAGNOSIS — I2510 Atherosclerotic heart disease of native coronary artery without angina pectoris: Secondary | ICD-10-CM | POA: Insufficient documentation

## 2018-02-12 DIAGNOSIS — Z8541 Personal history of malignant neoplasm of cervix uteri: Secondary | ICD-10-CM | POA: Insufficient documentation

## 2018-02-12 LAB — CBC WITH DIFFERENTIAL/PLATELET
Basophils Absolute: 0 10*3/uL (ref 0.0–0.1)
Basophils Relative: 0 %
EOS PCT: 1 %
Eosinophils Absolute: 0.1 10*3/uL (ref 0.0–0.7)
HCT: 38.6 % (ref 36.0–46.0)
HEMOGLOBIN: 12.9 g/dL (ref 12.0–15.0)
Lymphocytes Relative: 25 %
Lymphs Abs: 2.1 10*3/uL (ref 0.7–4.0)
MCH: 31.2 pg (ref 26.0–34.0)
MCHC: 33.4 g/dL (ref 30.0–36.0)
MCV: 93.5 fL (ref 78.0–100.0)
MONO ABS: 0.9 10*3/uL (ref 0.1–1.0)
Monocytes Relative: 11 %
NEUTROS ABS: 5.2 10*3/uL (ref 1.7–7.7)
NEUTROS PCT: 63 %
PLATELETS: 326 10*3/uL (ref 150–400)
RBC: 4.13 MIL/uL (ref 3.87–5.11)
RDW: 13.1 % (ref 11.5–15.5)
WBC: 8.3 10*3/uL (ref 4.0–10.5)

## 2018-02-12 LAB — URINALYSIS, ROUTINE W REFLEX MICROSCOPIC
BILIRUBIN URINE: NEGATIVE
Glucose, UA: NEGATIVE mg/dL
Ketones, ur: NEGATIVE mg/dL
Nitrite: NEGATIVE
Protein, ur: NEGATIVE mg/dL
SPECIFIC GRAVITY, URINE: 1.013 (ref 1.005–1.030)
pH: 6 (ref 5.0–8.0)

## 2018-02-12 LAB — BASIC METABOLIC PANEL
ANION GAP: 7 (ref 5–15)
BUN: 9 mg/dL (ref 6–20)
CHLORIDE: 97 mmol/L — AB (ref 101–111)
CO2: 30 mmol/L (ref 22–32)
Calcium: 8.5 mg/dL — ABNORMAL LOW (ref 8.9–10.3)
Creatinine, Ser: 0.49 mg/dL (ref 0.44–1.00)
GFR calc Af Amer: >60 mL/min (ref >60)
GFR calc non Af Amer: >60 mL/min (ref >60)
Glucose, Bld: 86 mg/dL (ref 65–99)
POTASSIUM: 3 mmol/L — AB (ref 3.5–5.1)
Sodium: 134 mmol/L — ABNORMAL LOW (ref 135–145)

## 2018-02-12 LAB — HEPATIC FUNCTION PANEL
ALT: 8 U/L — AB (ref 14–54)
AST: 16 U/L (ref 15–41)
Albumin: 3.8 g/dL (ref 3.5–5.0)
Alkaline Phosphatase: 53 U/L (ref 38–126)
BILIRUBIN DIRECT: 0.1 mg/dL (ref 0.1–0.5)
Indirect Bilirubin: 0.3 mg/dL (ref 0.3–0.9)
Total Bilirubin: 0.4 mg/dL (ref 0.3–1.2)
Total Protein: 7.2 g/dL (ref 6.5–8.1)

## 2018-02-12 LAB — TROPONIN I: Troponin I: <0.03 ng/mL (ref <0.03)

## 2018-02-12 LAB — LIPASE, BLOOD: Lipase: 28 U/L (ref 11–51)

## 2018-02-12 MED ORDER — LEVOFLOXACIN 750 MG PO TABS
750.0000 mg | ORAL_TABLET | Freq: Once | ORAL | Status: AC
  Administered 2018-02-12: 750 mg via ORAL
  Filled 2018-02-12: qty 1

## 2018-02-12 MED ORDER — IPRATROPIUM BROMIDE 0.02 % IN SOLN
1.0000 mg | Freq: Once | RESPIRATORY_TRACT | Status: AC
  Administered 2018-02-12: 1 mg via RESPIRATORY_TRACT
  Filled 2018-02-12: qty 5

## 2018-02-12 MED ORDER — LEVOFLOXACIN 750 MG PO TABS: 750.0000 mg | ORAL_TABLET | Freq: Every day | ORAL | 0 refills | Status: DC

## 2018-02-12 MED ORDER — METHYLPREDNISOLONE SODIUM SUCC 125 MG IJ SOLR
125.0000 mg | Freq: Once | INTRAMUSCULAR | Status: AC
  Administered 2018-02-12: 125 mg via INTRAVENOUS
  Filled 2018-02-12: qty 2

## 2018-02-12 MED ORDER — POTASSIUM CHLORIDE CRYS ER 20 MEQ PO TBCR
40.0000 meq | EXTENDED_RELEASE_TABLET | Freq: Once | ORAL | Status: AC
  Administered 2018-02-12: 40 meq via ORAL
  Filled 2018-02-12: qty 2

## 2018-02-12 MED ORDER — ALBUTEROL SULFATE HFA 108 (90 BASE) MCG/ACT IN AERS
2.0000 | INHALATION_SPRAY | RESPIRATORY_TRACT | 0 refills | Status: AC | PRN
Start: 2018-02-12 — End: ?

## 2018-02-12 MED ORDER — ALBUTEROL (5 MG/ML) CONTINUOUS INHALATION SOLN
10.0000 mg/h | INHALATION_SOLUTION | Freq: Once | RESPIRATORY_TRACT | Status: AC
  Administered 2018-02-12: 10 mg/h via RESPIRATORY_TRACT
  Filled 2018-02-12: qty 20

## 2018-02-12 MED ORDER — PREDNISONE 20 MG PO TABS: 40.0000 mg | ORAL_TABLET | Freq: Every day | ORAL | 0 refills | Status: DC

## 2018-02-12 MED ORDER — SODIUM CHLORIDE 0.9 % IV BOLUS
500.0000 mL | Freq: Once | INTRAVENOUS | Status: AC
  Administered 2018-02-12: 500 mL via INTRAVENOUS

## 2018-02-12 NOTE — ED Notes (Addendum)
Pt updated on plan of care, denies any complaints at present,  

## 2018-02-12 NOTE — Discharge Instructions (Addendum)
Take the prescriptions as directed.  Use your albuterol inhaler (2 to 4 puffs) every 4 hours for the next 7 days, then as needed for cough, wheezing, or shortness of breath. Increase your fluids (ie: Gatorade) for the next few days.  Call your regular medical doctor Monday morning to schedule a follow up appointment within the next 2 to 3 days.  Return to the Emergency Department immediately sooner if worsening.

## 2018-02-12 NOTE — ED Notes (Addendum)
Pt ambulated in hallway, ambulation was steady, pt remained at 96% o2 saturation on room air for the duration of the walk.

## 2018-02-12 NOTE — ED Triage Notes (Signed)
Pt c/o weakness/green prod cough and dizziness x 2 days. Nad. Steady gait to triage. No gen weakness obviously noted.

## 2018-02-12 NOTE — ED Provider Notes (Addendum)
[facility] EMERGENCY DEPARTMENT Provider Note   CSN: [id number] Arrival date & time: 02/12/18  1434     History   Chief Complaint Chief Complaint  Patient presents with  . Weakness    HPI [name] is a 72 y.o. female.   Weakness   Pt was seen at 1615. Per pt, c/o gradual onset and persistence of constant generalized weakness for the past 2 days. Has been associated with sinus congestion, rhinorrhea, cough, and wheezing. Describes the cough as productive of green sputum. Denies focal motor weakness, no tingling/numbness in extremities, no CP/palpitations, no SOB, no fevers, no rash, no change in chronic abd pain, no N/V/D, no back pain.   Past Medical History:  Diagnosis Date  . Allergic rhinitis   . Anxiety   . Anxiety and depression   . Back pain, chronic   . CAD (coronary artery disease)    palpitations, dizziness, chest pain  . Cervical cancer (HCC)    cervical  . Chronic abdominal pain    with chronic nausea, diarrhea  . COPD (chronic obstructive pulmonary disease) ([place])   . Depression   . GERD (gastroesophageal reflux disease)   . Helicobacter pylori infection 2015   treated with prevpac  . Hematochezia   . History of kidney stones   . Hx of colonic polyps    adenomatous  . Hyperlipidemia    Lipid profile on 02/25/2011: 209, 113, 55, 132  . Nephrolithiasis   . Pancreatitis chronic   . Renal calculus   . Stroke [facility]) 3-4 yrs ago   left sided weakness  . Tobacco abuse   . Tubular adenoma 2015  . Weight loss    CT negative for occult malignancy, negative celiac, negative adrenal insufficiency    Patient Active Problem List   Diagnosis Date Noted  . Diarrhea 06/09/2017  . Colitis 07/01/2016  . RUQ pain 10/19/2012  . Nausea with vomiting 05/07/2012  . Constipation 05/07/2012  . Laboratory test 02/17/2012  . Hyperlipidemia   . Chest pain 01/07/2011  . ANOREXIA 05/27/2010  . WEIGHT LOSS 05/27/2010  . COLONIC POLYPS, ADENOMATOUS, HX OF  12/20/2008  . ANXIETY DEPRESSION 12/19/2008  . Tobacco abuse 12/19/2008  . ALLERGIC RHINITIS, SEASONAL 12/19/2008  . COPD 12/19/2008  . GERD 12/19/2008  . Chronic pancreatitis ([place]) 12/19/2008  . BACK PAIN, CHRONIC 12/19/2008  . Abdominal pain 12/19/2008  . CERVICAL CANCER, HX OF 12/19/2008  . Nephrolithiasis 12/19/2008    Past Surgical History:  Procedure Laterality Date  . ABDOMINAL HYSTERECTOMY    . APPENDECTOMY    . CATARACT EXTRACTION W/PHACO Right 05/04/2013   Procedure: CATARACT EXTRACTION PHACO AND INTRAOCULAR LENS PLACEMENT (IOC);  Surgeon: Tonny Branch, MD;  Location: [facility] ORS;  Service: Ophthalmology;  Laterality: Right;  CDE:16.17  . CATARACT EXTRACTION W/PHACO Left 05/22/2013   Procedure: CATARACT EXTRACTION PHACO AND INTRAOCULAR LENS PLACEMENT (IOC);  Surgeon: Tonny Branch, MD;  Location: [facility] ORS;  Service: Ophthalmology;  Laterality: Left;  CDE:14.75  . COLONOSCOPY N/A 02/08/2014   Dr.Rourk- normal rectum, colonic polyps bx=tubular adenoma  . COLONOSCOPY W/ POLYPECTOMY  2006   YCX:[id number], benign gastric nodule, multiple adenomatous polyps, one with tubular morphology  . COLONOSCOPY WITH PROPOFOL N/A 04/15/2017   Procedure: COLONOSCOPY WITH PROPOFOL;  Surgeon: Daneil Dolin, MD;  Location: [facility] ENDO SUITE;  Service: Endoscopy;  Laterality: N/A;  11:15am  . ESOPHAGOGASTRODUODENOSCOPY  2006   FWY:OVZCHY gastric nodule  . ESOPHAGOGASTRODUODENOSCOPY N/A 02/08/2014   Dr.Rourk- abnormal stomach and gstric nodule bx=  hpylori  . EUS  2010   Dr. Ardis Hughs : Multiple shadowing calcifications in pancreas, consistent with Chronic Pancreatitis.  These are mainly confined to a collection of calcifications in head/uncinate pancreas. Otherwise the pancreatic parenchyma appears fairly normal.  The main pancreatic duct, CBD are both normal without stones, dilation.    . Ileocolonoscopy  01/11/2009   RMR: Polyp at the splenic flexure, status post hot snare removal/ Normal rectum, tubular adenoma    . PARTIAL HYSTERECTOMY       OB History    Gravida  6   Para  3   Term  3   Preterm      AB  3   Living        SAB  3   TAB      Ectopic      Multiple      Live Births               Home Medications    Prior to Admission medications   Medication Sig Start Date End Date Taking? Authorizing Provider  acetaminophen (TYLENOL) 325 MG tablet Take 650 mg by mouth every 6 (six) hours as needed for mild pain.    [provider]  ADVAIR DISKUS 250-50 MCG/DOSE AEPB Inhale 1 puff into the lungs 2 (two) times daily.  10/05/12   [provider]  Calcium Carbonate (CALCIUM 600 PO) Take 1 tablet by mouth daily.      [provider]  cephALEXin (KEFLEX) 500 MG capsule Take 1 capsule (500 mg total) by mouth 3 (three) times daily. 01/18/18   Horton, Barbette Hair, MD  cilostazol (PLETAL) 100 MG tablet Take 100 mg by mouth 2 (two) times daily.  03/29/15   [provider]  CREON 24000-76000 units CPEP TAKE 2 CAPSULES WITH EACH MEALS AND 1 CAPSULE WITH SNACKS. 12/30/17   Mahala Menghini, PA-C  DULoxetine (CYMBALTA) 60 MG capsule Take 60 mg by mouth 2 (two) times daily.     [provider]  escitalopram (LEXAPRO) 10 MG tablet Take 10 mg by mouth daily. 01/30/17   [provider]  gabapentin (NEURONTIN) 100 MG capsule Take 100 mg by mouth 3 (three) times daily. 11/16/17   [provider]  hydrOXYzine (ATARAX/VISTARIL) 25 MG tablet Take 1 tablet by mouth 3 (three) times daily as needed for anxiety or itching.  04/21/16   [provider]  ipratropium (ATROVENT) 0.06 % nasal spray Place 2 sprays into both nostrils 2 (two) times daily as needed for rhinitis.    [provider]  LINZESS 145 MCG CAPS capsule Take 1 capsule by mouth daily. 04/16/17   [provider]  LORazepam (ATIVAN) 1 MG tablet Take 1 mg by mouth every 8 (eight) hours as needed for anxiety. Reported on 11/19/2015    [provider]   nitroGLYCERIN (NITROSTAT) 0.4 MG SL tablet Place 1 tablet (0.4 mg total) under the tongue every 5 (five) minutes as needed. 11/21/14   Herminio Commons, MD  omeprazole (PRILOSEC) 20 MG capsule Take 1 capsule (20 mg total) by mouth 2 (two) times daily before a meal. 12/07/17   Carlis Stable, NP  oxyCODONE-acetaminophen (PERCOCET/ROXICET) 5-325 MG tablet Take 1 tablet by mouth every 6 (six) hours as needed for severe pain.    [provider]  potassium chloride 20 MEQ TBCR Take 20 mEq by mouth daily. 04/13/17 12/07/17  Carlis Stable, NP  PROAIR HFA 108 [phone] Base) MCG/ACT inhaler Inhale 2  puffs into the lungs every 4 (four) hours as needed for shortness of breath or wheezing. 01/30/17   [provider]  promethazine (PHENERGAN) 25 MG tablet Take 25 mg by mouth every 6 (six) hours as needed for nausea or vomiting.     [provider]  RESTASIS 0.05 % ophthalmic emulsion Place 1 drop into both eyes 2 (two) times daily. 03/27/16   [provider]  Vitamin D, Ergocalciferol, (DRISDOL) 50000 units CAPS capsule Take 50,000 Units by mouth every 7 (seven) days.    [provider]    Family History Family History  Problem Relation Age of Onset  . Heart attack Father 61  . Colon cancer Maternal Grandmother     Social History Social History   Tobacco Use  . Smoking status: Current Every Day Smoker    Packs/day: 0.50    Years: 50.00    Pack years: 25.00    Types: Cigarettes    Start date: 07/18/1962  . Smokeless tobacco: Never Used  . Tobacco comment: 3/4 pack daily  Substance Use Topics  . Alcohol use: No    Alcohol/week: 0.0 oz    Comment: hx of ETOH about 20 years ago.   . Drug use: No     Allergies   Ibuprofen; Iohexol; and Strawberry (diagnostic)   Review of Systems Review of Systems  Neurological: Positive for weakness.  ROS: Statement: All systems negative except as marked or noted in the HPI; Constitutional: Negative for fever and chills.  +generalized weakness/fatigue. ; ; Eyes: Negative for eye pain, redness and discharge. ; ; ENMT: Negative for ear pain, hoarseness, sore throat. +nasal congestion, sinus pressure and rhinorrhea. ; ; Cardiovascular: Negative for chest pain, palpitations, diaphoresis, dyspnea and peripheral edema. ; ; Respiratory: +cough, wheezing. Negative for stridor. ; ; Gastrointestinal: Negative for nausea, vomiting, diarrhea, abdominal pain, blood in stool, hematemesis, jaundice and rectal bleeding. . ; ; Genitourinary: Negative for dysuria, flank pain and hematuria. ; ; Musculoskeletal: Negative for back pain and neck pain. Negative for swelling and trauma.; ; Skin: Negative for pruritus, rash, abrasions, blisters, bruising and skin lesion.; ; Neuro: Negative for headache, lightheadedness and neck stiffness. Negative for altered level of consciousness, altered mental status, extremity weakness, paresthesias, involuntary movement, seizure and syncope.      Physical Exam Updated Vital Signs BP 101/69   Pulse 75   Temp 97.9 F (36.6 C) (Oral)   Resp 15   SpO2 95%    Patient Vitals for the past 24 hrs:  BP Temp Temp src Pulse Resp SpO2  02/12/18 1930 112/73 - - - (!) 21 -  02/12/18 1830 102/68 - - (!) 102 17 97 %  02/12/18 1800 92/64 - - 81 13 100 %  02/12/18 1730 (!) 114/101 - - 91 (!) 9 100 %  02/12/18 1724 118/80 - - 79 15 100 %  02/12/18 1642 - - - - - 96 %  02/12/18 1638 116/74 - - - 15 -  02/12/18 1621 101/69 - - 75 15 95 %  02/12/18 1600 96/67 - - 78 15 98 %  02/12/18 1443 133/80 97.9 F (36.6 C) Oral 86 17 98 %     17:19 Orthostatic Vital Signs TC  Orthostatic Lying   BP- Lying: 96/62   Pulse- Lying: 79       Orthostatic Sitting  BP- Sitting: 118/80   Pulse- Sitting: 80       Orthostatic Standing at 0 minutes  BP- Standing at 0  minutes: 111/71   Pulse- Standing at 0 minutes: 88       Orthostatic Standing at 3 minutes  BP- Standing at 3 minutes: 104/81   Pulse- Standing at 3  minutes: 94      Physical Exam 1620; Physical examination:  Nursing notes reviewed; Vital signs and O2 SAT reviewed;  Constitutional: Thin, frail. Well hydrated, In no acute distress; Head:  Normocephalic, atraumatic; Eyes: EOMI, PERRL, No scleral icterus; ENMT: Mouth and pharynx normal, Mucous membranes moist. TM's clear bilat. +edemetous nasal turbinates bilat with clear rhinorrhea. ; Neck: Supple, Full range of motion, No lymphadenopathy; Cardiovascular: Regular rate and rhythm, No gallop; Respiratory: Breath sounds dimnished & equal bilaterally, scattered wheezes bilat. No audible wheezes.  Speaking full sentences with ease, Normal respiratory effort/excursion; Chest: Nontender, Movement normal; Abdomen: Soft, Nontender, Nondistended, Normal bowel sounds; Genitourinary: No CVA tenderness; Extremities: Peripheral pulses normal, No tenderness, No edema, No calf edema or asymmetry.; Neuro: AA&Ox3, Major CN grossly intact.  Speech clear. No gross focal motor or sensory deficits in extremities.; Skin: Color normal, Warm, Dry.   ED Treatments / Results  Labs (all labs ordered are listed, but only abnormal results are displayed)   EKG None  Radiology   Procedures Procedures (including critical care time)  Medications Ordered in ED Medications  methylPREDNISolone sodium succinate (SOLU-MEDROL) 125 mg/2 mL injection 125 mg (has no administration in time range)  albuterol (PROVENTIL,VENTOLIN) solution continuous neb (has no administration in time range)  ipratropium (ATROVENT) nebulizer solution 1 mg (has no administration in time range)     Initial Impression / Assessment and Plan / ED Course  I have reviewed the triage vital signs and the nursing notes.  Pertinent labs & imaging results that were available during my care of the patient were reviewed by me and considered in my medical decision making (see chart for details).  MDM Reviewed: previous chart, nursing note and  vitals Reviewed previous: labs and ECG Interpretation: labs, ECG and x-ray Total time providing critical care: 30-74 minutes. This excludes time spent performing separately reportable procedures and services.   CRITICAL CARE Performed by: Alfonzo Feller Total critical care time: 35 minutes Critical care time was exclusive of separately billable procedures and treating other patients. Critical care was necessary to treat or prevent imminent or life-threatening deterioration. Critical care was time spent personally by me on the following activities: development of treatment plan with patient and/or surrogate as well as nursing, discussions with consultants, evaluation of patient's response to treatment, examination of patient, obtaining history from patient or surrogate, ordering and performing treatments and interventions, ordering and review of laboratory studies, ordering and review of radiographic studies, pulse oximetry and re-evaluation of patient's condition.   ED ECG REPORT   Date: 02/12/2018  Rate: 89  Rhythm: normal sinus rhythm  QRS Axis: normal  Intervals: normal  ST/T Wave abnormalities: normal  Conduction Disutrbances:none  Narrative Interpretation:   Old EKG Reviewed: unchanged; no significant changes from previous EKG dated 04/26/2013, 04/13/2017. I have personally reviewed the EKG tracing and agree with the computerized printout as noted.   Results for orders placed or performed during the hospital encounter of 46/96/29  Basic metabolic panel  Result Value Ref Range   Sodium 134 (L) 135 - 145 mmol/L   Potassium 3.0 (L) 3.5 - 5.1 mmol/L   Chloride 97 (L) 101 - 111 mmol/L   CO2 30 22 - 32 mmol/L   Glucose, Bld 86 65 - 99 mg/dL   BUN 9 6 -  20 mg/dL   Creatinine, Ser 0.49 0.44 - 1.00 mg/dL   Calcium 8.5 (L) 8.9 - 10.3 mg/dL   GFR calc non Af Amer >60 >60 mL/min   GFR calc Af Amer >60 >60 mL/min   Anion gap 7 5 - 15  Urinalysis, Routine w reflex microscopic   Result Value Ref Range   Color, Urine YELLOW YELLOW   APPearance HAZY (A) CLEAR   Specific Gravity, Urine 1.013 1.005 - 1.030   pH 6.0 5.0 - 8.0   Glucose, UA NEGATIVE NEGATIVE mg/dL   Hgb urine dipstick MODERATE (A) NEGATIVE   Bilirubin Urine NEGATIVE NEGATIVE   Ketones, ur NEGATIVE NEGATIVE mg/dL   Protein, ur NEGATIVE NEGATIVE mg/dL   Nitrite NEGATIVE NEGATIVE   Leukocytes, UA MODERATE (A) NEGATIVE   RBC / HPF 6-10 0 - 5 RBC/hpf   WBC, UA 21-50 0 - 5 WBC/hpf   Bacteria, UA RARE (A) NONE SEEN   Squamous Epithelial / LPF 11-20 0 - 5   Mucus PRESENT   CBC with Differential  Result Value Ref Range   WBC 8.3 4.0 - 10.5 K/uL   RBC 4.13 3.87 - 5.11 MIL/uL   Hemoglobin 12.9 12.0 - 15.0 g/dL   HCT 38.6 36.0 - 46.0 %   MCV 93.5 78.0 - 100.0 fL   MCH 31.2 26.0 - 34.0 pg   MCHC 33.4 30.0 - 36.0 g/dL   RDW 13.1 11.5 - 15.5 %   Platelets 326 150 - 400 K/uL   Neutrophils Relative % 63 %   Lymphocytes Relative 25 %   Monocytes Relative 11 %   Eosinophils Relative 1 %   Basophils Relative 0 %   Neutro Abs 5.2 1.7 - 7.7 K/uL   Lymphs Abs 2.1 0.7 - 4.0 K/uL   Monocytes Absolute 0.9 0.1 - 1.0 K/uL   Eosinophils Absolute 0.1 0.0 - 0.7 K/uL   Basophils Absolute 0.0 0.0 - 0.1 K/uL   WBC Morphology ATYPICAL LYMPHOCYTES   Troponin I  Result Value Ref Range   Troponin I <0.03 <0.03 ng/mL  Hepatic function panel  Result Value Ref Range   Total Protein 7.2 6.5 - 8.1 g/dL   Albumin 3.8 3.5 - 5.0 g/dL   AST 16 15 - 41 U/L   ALT 8 (L) 14 - 54 U/L   Alkaline Phosphatase 53 38 - 126 U/L   Total Bilirubin 0.4 0.3 - 1.2 mg/dL   Bilirubin, Direct 0.1 0.1 - 0.5 mg/dL   Indirect Bilirubin 0.3 0.3 - 0.9 mg/dL  Lipase, blood  Result Value Ref Range   Lipase 28 11 - 51 U/L   Dg Chest 2 View Result Date: 02/12/2018 CLINICAL DATA:  Cough, dizziness and weakness for 2 days. EXAM: CHEST - 2 VIEW COMPARISON:  06/02/2017 thoracic spine radiographs, 03/29/2013 chest radiographs and prior studies.  FINDINGS: The cardiomediastinal silhouette is unremarkable. Severe emphysema again noted. There is no evidence of focal airspace disease, pulmonary edema, suspicious pulmonary nodule/mass, pleural effusion, or pneumothorax. No acute bony abnormalities are identified. IMPRESSION: Emphysema without evidence of acute cardiopulmonary disease. Electronically Signed   By: Margarette Canada M.D.   On: 02/12/2018 15:23    2000:  IVF, Hour long neb and IV solumedrol given for wheezing with improvement. Potassium repleted PO. Pt has tol PO well while in the ED without N/V.  Pt ambulated with steady gait, easy resps, NAD, and Sats remained 96% R/A. Pt denied any complaints. BP per previous on file. Possible UTI on Udip,  UC pending. Will dose levaquin for COPD exacerbation as well as UTI tx. Pt states she feels better and wants to go home now. Dx and testing d/w pt.  Questions answered.  Verb understanding, agreeable to d/c home with outpt f/u.    Final Clinical Impressions(s) / ED Diagnoses   Final diagnoses:  None    ED Discharge Orders    None       Francine Graven, DO 02/16/18 [place], [place], DO 02/21/18 [phone]

## 2018-02-14 LAB — URINE CULTURE

## 2018-04-21 ENCOUNTER — Encounter (HOSPITAL_COMMUNITY): Payer: Self-pay

## 2018-04-21 ENCOUNTER — Other Ambulatory Visit: Payer: Self-pay

## 2018-04-21 ENCOUNTER — Emergency Department (HOSPITAL_COMMUNITY)
Admission: EM | Admit: 2018-04-21 | Discharge: 2018-04-21 | Disposition: A | Discharge status: Home / Self Care | Payer: Medicare Other | Attending: Emergency Medicine | Admitting: Emergency Medicine

## 2018-04-21 DIAGNOSIS — I2510 Atherosclerotic heart disease of native coronary artery without angina pectoris: Secondary | ICD-10-CM | POA: Insufficient documentation

## 2018-04-21 DIAGNOSIS — Z8541 Personal history of malignant neoplasm of cervix uteri: Secondary | ICD-10-CM | POA: Diagnosis not present

## 2018-04-21 DIAGNOSIS — F17210 Nicotine dependence, cigarettes, uncomplicated: Secondary | ICD-10-CM | POA: Insufficient documentation

## 2018-04-21 DIAGNOSIS — W548XXA Other contact with dog, initial encounter: Secondary | ICD-10-CM | POA: Insufficient documentation

## 2018-04-21 DIAGNOSIS — Y9389 Activity, other specified: Secondary | ICD-10-CM | POA: Insufficient documentation

## 2018-04-21 DIAGNOSIS — Y998 Other external cause status: Secondary | ICD-10-CM | POA: Diagnosis not present

## 2018-04-21 DIAGNOSIS — S51811A Laceration without foreign body of right forearm, initial encounter: Secondary | ICD-10-CM | POA: Diagnosis not present

## 2018-04-21 DIAGNOSIS — Z79899 Other long term (current) drug therapy: Secondary | ICD-10-CM | POA: Insufficient documentation

## 2018-04-21 DIAGNOSIS — Y929 Unspecified place or not applicable: Secondary | ICD-10-CM | POA: Diagnosis not present

## 2018-04-21 DIAGNOSIS — J449 Chronic obstructive pulmonary disease, unspecified: Secondary | ICD-10-CM | POA: Insufficient documentation

## 2018-04-21 MED ORDER — BACITRACIN ZINC 500 UNIT/GM EX OINT: TOPICAL_OINTMENT | Freq: Once | CUTANEOUS | Status: DC

## 2018-04-21 NOTE — ED Triage Notes (Signed)
Pt has a small skin tear on her right arm from her dog. States she takes Plavix. Bleeding is controlled at triage. NAD

## 2018-04-21 NOTE — ED Notes (Signed)
Skin tear wrapped by RN

## 2018-04-21 NOTE — Discharge Instructions (Signed)
Your skin tear was debrided here in the ED.  Keep wound clean and covered and this should slowly heal on its own.  After 24 hours you may shower, please change your dressing at least once daily, if you have increasing amounts of bleeding please return to the ED.  Please monitor for redness, swelling, increasing pain or drainage as these are signs of infection and should warrant sooner return, otherwise please follow-up with your primary care doctor for a wound check.

## 2018-04-21 NOTE — ED Provider Notes (Signed)
[facility] EMERGENCY DEPARTMENT Provider Note   CSN: [id number] Arrival date & time: 04/21/18  1321     History   Chief Complaint Chief Complaint  Patient presents with  . Abrasion    HPI [name] is a 72 y.o. female.  [name] is a 72 y.o. Female with a history of coronary artery disease and hyperlipidemia, COPD, chronic abdominal pain and stroke, presents to emergency department for evaluation of a small skin tear on the right arm.  Patient reports last night she was playing with her dog and his paw caught her arm when she jerked back it tore away a thin layer of the skin.  She reports is continued to ooze blood overnight she could not quite get it to stop, she does take Plavix but does not take any other blood thinners.  She reports its tender to the touch but not severely painful.  No numbness or tingling.  No other scratches or abrasions, wound did not occur from the dog's mouth.  Tetanus is up-to-date.     Past Medical History:  Diagnosis Date  . Allergic rhinitis   . Anxiety   . Anxiety and depression   . Back pain, chronic   . CAD (coronary artery disease)    palpitations, dizziness, chest pain  . Cervical cancer (HCC)    cervical  . Chronic abdominal pain    with chronic nausea, diarrhea  . COPD (chronic obstructive pulmonary disease) ([place])   . Depression   . GERD (gastroesophageal reflux disease)   . Helicobacter pylori infection 2015   treated with prevpac  . Hematochezia   . History of kidney stones   . Hx of colonic polyps    adenomatous  . Hyperlipidemia    Lipid profile on 02/25/2011: 209, 113, 55, 132  . Nephrolithiasis   . Pancreatitis chronic   . Renal calculus   . Stroke [facility]) 3-4 yrs ago   left sided weakness  . Tobacco abuse   . Tubular adenoma 2015  . Weight loss    CT negative for occult malignancy, negative celiac, negative adrenal insufficiency    Patient Active Problem List   Diagnosis Date Noted  . Diarrhea  06/09/2017  . Colitis 07/01/2016  . RUQ pain 10/19/2012  . Nausea with vomiting 05/07/2012  . Constipation 05/07/2012  . Laboratory test 02/17/2012  . Hyperlipidemia   . Chest pain 01/07/2011  . ANOREXIA 05/27/2010  . WEIGHT LOSS 05/27/2010  . COLONIC POLYPS, ADENOMATOUS, HX OF 12/20/2008  . ANXIETY DEPRESSION 12/19/2008  . Tobacco abuse 12/19/2008  . ALLERGIC RHINITIS, SEASONAL 12/19/2008  . COPD 12/19/2008  . GERD 12/19/2008  . Chronic pancreatitis ([place]) 12/19/2008  . BACK PAIN, CHRONIC 12/19/2008  . Abdominal pain 12/19/2008  . CERVICAL CANCER, HX OF 12/19/2008  . Nephrolithiasis 12/19/2008    Past Surgical History:  Procedure Laterality Date  . ABDOMINAL HYSTERECTOMY    . APPENDECTOMY    . CATARACT EXTRACTION W/PHACO Right 05/04/2013   Procedure: CATARACT EXTRACTION PHACO AND INTRAOCULAR LENS PLACEMENT (IOC);  Surgeon: Tonny Branch, MD;  Location: [facility] ORS;  Service: Ophthalmology;  Laterality: Right;  CDE:16.17  . CATARACT EXTRACTION W/PHACO Left 05/22/2013   Procedure: CATARACT EXTRACTION PHACO AND INTRAOCULAR LENS PLACEMENT (IOC);  Surgeon: Tonny Branch, MD;  Location: [facility] ORS;  Service: Ophthalmology;  Laterality: Left;  CDE:14.75  . COLONOSCOPY N/A 02/08/2014   Dr.Rourk- normal rectum, colonic polyps bx=tubular adenoma  . COLONOSCOPY W/ POLYPECTOMY  2006   GEX:[id number], benign gastric  nodule, multiple adenomatous polyps, one with tubular morphology  . COLONOSCOPY WITH PROPOFOL N/A 04/15/2017   Procedure: COLONOSCOPY WITH PROPOFOL;  Surgeon: Daneil Dolin, MD;  Location: [facility] ENDO SUITE;  Service: Endoscopy;  Laterality: N/A;  11:15am  . ESOPHAGOGASTRODUODENOSCOPY  2006   HUD:[id number] gastric nodule  . ESOPHAGOGASTRODUODENOSCOPY N/A 02/08/2014   Dr.Rourk- abnormal stomach and gstric nodule bx= hpylori  . EUS  2010   Dr. Ardis Hughs : Multiple shadowing calcifications in pancreas, consistent with Chronic Pancreatitis.  These are mainly confined to a collection of calcifications in  head/uncinate pancreas. Otherwise the pancreatic parenchyma appears fairly normal.  The main pancreatic duct, CBD are both normal without stones, dilation.    . Ileocolonoscopy  01/11/2009   RMR: Polyp at the splenic flexure, status post hot snare removal/ Normal rectum, tubular adenoma  . PARTIAL HYSTERECTOMY       OB History    Gravida  6   Para  3   Term  3   Preterm      AB  3   Living        SAB  3   TAB      Ectopic      Multiple      Live Births               Home Medications    Prior to Admission medications   Medication Sig Start Date End Date Taking? Authorizing Provider  ADVAIR DISKUS 250-50 MCG/DOSE AEPB Inhale 1 puff into the lungs 2 (two) times daily.  10/05/12   [provider]  albuterol (PROVENTIL HFA;VENTOLIN HFA) 108 (90 Base) MCG/ACT inhaler Inhale 2 puffs into the lungs every 4 (four) hours as needed for wheezing or shortness of breath. 02/12/18   Francine Graven, DO  Calcium Carbonate (CALCIUM 600 PO) Take 1 tablet by mouth daily.      [provider]  cephALEXin (KEFLEX) 500 MG capsule Take 1 capsule (500 mg total) by mouth 3 (three) times daily. 01/18/18   Horton, Barbette Hair, MD  cilostazol (PLETAL) 100 MG tablet Take 100 mg by mouth 2 (two) times daily.  03/29/15   [provider]  CREON 24000-76000 units CPEP TAKE 2 CAPSULES WITH EACH MEALS AND 1 CAPSULE WITH SNACKS. 12/30/17   Mahala Menghini, PA-C  DULoxetine (CYMBALTA) 60 MG capsule Take 60 mg by mouth 2 (two) times daily.     [provider]  escitalopram (LEXAPRO) 10 MG tablet Take 10 mg by mouth daily. 01/30/17   [provider]  hydrOXYzine (ATARAX/VISTARIL) 25 MG tablet Take 1 tablet by mouth as needed for anxiety or itching.  04/21/16   [provider]  ipratropium (ATROVENT) 0.06 % nasal spray Place 2 sprays into both nostrils 2 (two) times daily as needed for rhinitis.    [provider]  levofloxacin (LEVAQUIN) 750 MG tablet  Take 1 tablet (750 mg total) by mouth daily. 02/12/18   Francine Graven, DO  LINZESS 145 MCG CAPS capsule Take 1 capsule by mouth daily. 04/16/17   [provider]  LORazepam (ATIVAN) 1 MG tablet Take 1 mg by mouth every 8 (eight) hours as needed for anxiety. Reported on 11/19/2015    [provider]  nitroGLYCERIN (NITROSTAT) 0.4 MG SL tablet Place 1 tablet (0.4 mg total) under the tongue every 5 (five) minutes as needed. 11/21/14   Herminio Commons, MD  omeprazole (PRILOSEC) 20 MG capsule Take 1 capsule (20 mg total) by mouth 2 (two)  times daily before a meal. 12/07/17   Carlis Stable, NP  oxyCODONE-acetaminophen (PERCOCET/ROXICET) 5-325 MG tablet Take 1 tablet by mouth every 6 (six) hours as needed for severe pain.    [provider]  potassium chloride 20 MEQ TBCR Take 20 mEq by mouth daily. 04/13/17 12/07/17  Carlis Stable, NP  potassium chloride SA (K-DUR,KLOR-CON) 20 MEQ tablet Take 20 mEq by mouth daily.    [provider]  predniSONE (DELTASONE) 20 MG tablet Take 2 tablets (40 mg total) by mouth daily. 02/12/18   Francine Graven, DO  PROAIR HFA 108 [phone] Base) MCG/ACT inhaler Inhale 2 puffs into the lungs every 4 (four) hours as needed for shortness of breath or wheezing. 01/30/17   [provider]  promethazine (PHENERGAN) 25 MG tablet Take 25 mg by mouth every 6 (six) hours as needed for nausea or vomiting.     [provider]  RESTASIS 0.05 % ophthalmic emulsion Place 1 drop into both eyes 2 (two) times daily. 03/27/16   [provider]  Vitamin D, Ergocalciferol, (DRISDOL) 50000 units CAPS capsule Take 50,000 Units by mouth every 7 (seven) days.    [provider]    Family History Family History  Problem Relation Age of Onset  . Heart attack Father 12  . Colon cancer Maternal Grandmother     Social History Social History   Tobacco Use  . Smoking status: Current Every Day Smoker    Packs/day: 0.50    Years:  50.00    Pack years: 25.00    Types: Cigarettes    Start date: 07/18/1962  . Smokeless tobacco: Never Used  . Tobacco comment: 3/4 pack daily  Substance Use Topics  . Alcohol use: No    Alcohol/week: 0.0 oz    Comment: hx of ETOH about 20 years ago.   . Drug use: No     Allergies   Ibuprofen; Iohexol; Other; Strawberry (diagnostic); and Strawberry extract   Review of Systems Review of Systems  Constitutional: Negative for chills and fever.  Musculoskeletal: Negative for arthralgias and myalgias.  Skin: Positive for wound. Negative for color change and rash.  Neurological: Negative for weakness and numbness.     Physical Exam Updated Vital Signs BP 121/83 (BP Location: Left Arm)   Pulse 76   Temp 98.1 F (36.7 C) (Oral)   Resp 16   Wt 34.5 kg (76 lb)   SpO2 97%   BMI 13.90 kg/m   Physical Exam  Constitutional: She appears well-developed and well-nourished. No distress.  HENT:  Head: Normocephalic and atraumatic.  Eyes: Right eye exhibits no discharge. Left eye exhibits no discharge.  Pulmonary/Chest: Effort normal. No respiratory distress.  Musculoskeletal:  2 x 2.5 cm skin tear present over the dorsal surface of the right forearm, dressing applied in triage which has stopped bleeding.  There is a thin layer of skin that appears to have poor blood supply, I do not feel that this would hold a suture for repair.  Damage appears very superficial, there are no deeper lacerations within the tear.  No surrounding erythema or swelling.  2+ radial pulse, cardinal hand movements intact and good capillary refill, sensation intact  Neurological: She is alert. Coordination normal.  Skin: Skin is warm and dry. She is not diaphoretic.  Psychiatric: She has a normal mood and affect. Her behavior is normal.  Nursing note and vitals reviewed.        ED Treatments / Results  Labs (all  labs ordered are listed, but only abnormal results are displayed) Labs Reviewed - No data  to display  EKG None  Radiology No results found.  Procedures Debridement Date/Time: 04/21/2018 4:16 PM Performed by: Jacqlyn Larsen, PA-C Authorized by: Jacqlyn Larsen, PA-C  Consent: The procedure was performed in an emergent situation. Verbal consent obtained. Consent given by: patient Patient understanding: patient states understanding of the procedure being performed Patient identity confirmed: verbally with patient Preparation: Patient was prepped and draped in the usual sterile fashion. Local anesthesia used: no  Anesthesia: Local anesthesia used: no  Sedation: Patient sedated: no  Comments: Superficial skin tear debrided of loose skin that was not amenable to repair, wound cleaned with Shur-Clens, and cleared of any debris    (including critical care time)  Medications Ordered in ED Medications  bacitracin ointment (has no administration in time range)     Initial Impression / Assessment and Plan / ED Course  I have reviewed the triage vital signs and the nursing notes.  Pertinent labs & imaging results that were available during my care of the patient were reviewed by me and considered in my medical decision making (see chart for details).  Patient presents for evaluation of superficial skin tear, she was scratched by her dog's paw last night, she reports is continued to bleed a little bit, she is on Plavix.  No evidence of foreign bodies or deeper tissue damage.  On exam the right upper extremity is neurovascularly intact.  There is a 2 x 2.5 cm skin tear with a thin layer of skin pulled back, I feel skin has lost most of its blood supply and would not be amenable to suture repair, wound debrided of any loose skin and debris, cleaned and nonadherent dressing placed.  Hemostasis with dressing.  Counseled on appropriate wound care and signs of infection that should warrant sooner return.  Patient and daughter expressed understanding and are in agreement with  plan.  Patient discussed with Dr. Laverta Baltimore, who saw patient as well and agrees with plan.   Final Clinical Impressions(s) / ED Diagnoses   Final diagnoses:  Skin tear of right forearm without complication, initial encounter    ED Discharge Orders    None       Jacqlyn Larsen, [place] 04/21/18 1637    Margette Fast, MD 04/22/18 1446

## 2018-04-21 NOTE — ED Notes (Signed)
Pt has skin tear to RT forearm. States her dog scratched her. Bleeding controlled at this time. Pt states she is on blood thinners.

## 2018-05-26 ENCOUNTER — Other Ambulatory Visit: Payer: Self-pay | Admitting: Nurse Practitioner

## 2018-05-30 ENCOUNTER — Other Ambulatory Visit: Payer: Self-pay | Admitting: Nurse Practitioner

## 2018-05-30 DIAGNOSIS — R1011 Right upper quadrant pain: Secondary | ICD-10-CM

## 2018-05-30 DIAGNOSIS — K861 Other chronic pancreatitis: Secondary | ICD-10-CM

## 2018-05-30 DIAGNOSIS — K219 Gastro-esophageal reflux disease without esophagitis: Secondary | ICD-10-CM

## 2018-05-30 DIAGNOSIS — R112 Nausea with vomiting, unspecified: Secondary | ICD-10-CM

## 2018-05-30 DIAGNOSIS — R634 Abnormal weight loss: Secondary | ICD-10-CM

## 2018-06-14 ENCOUNTER — Encounter: Payer: Self-pay | Admitting: Internal Medicine

## 2018-06-14 ENCOUNTER — Ambulatory Visit (INDEPENDENT_AMBULATORY_CARE_PROVIDER_SITE_OTHER): Payer: Medicare Other | Admitting: Internal Medicine

## 2018-06-14 VITALS — BP 110/71 | HR 99 | Temp 97.0°F | Ht 64.0 in | Wt 76.0 lb

## 2018-06-14 DIAGNOSIS — K860 Alcohol-induced chronic pancreatitis: Secondary | ICD-10-CM | POA: Diagnosis not present

## 2018-06-14 DIAGNOSIS — K219 Gastro-esophageal reflux disease without esophagitis: Secondary | ICD-10-CM | POA: Diagnosis not present

## 2018-06-14 DIAGNOSIS — K5903 Drug induced constipation: Secondary | ICD-10-CM | POA: Diagnosis not present

## 2018-06-14 NOTE — Patient Instructions (Signed)
Continue omeprazole 20 mg daily  Continue linzess 72 daily  Continue Creon dailly  Pain medication per Dr. Karie Kirks  Office visit in 6 months

## 2018-06-14 NOTE — Progress Notes (Signed)
Primary Care Physician:  Lemmie Evens, MD Primary Gastroenterologist:  Dr. Gala Romney  Pre-Procedure History & Physical: HPI:  [name] is a 72 y.o. female here for follow-up of cachexia, failure to thrive, weight loss, chronic pancreatitis probably secondary to prior alcohol exposure with secondary exocrine insufficiency, opioid-induced constipation and GERD. States she is doing better since oxycodone was bumped up to 7.5 mg. She is on Creon and omeprazole. If she misses any Creon she definitely has worsening of pain. Has 1-2 bowel movements weekly. She states this is her lifelong baseline. No diarrhea. Not much of an eater  - eats 1-2 small meals daily and this has not changed over a period of decades. No further alcohol. Attempt a celiac block at [facility] failed due to anatomical challenges. She is followed closely by Dr. Karie Kirks  as well.  She has had a thorough evaluation for other causes of weight loss and anorexia.  Past Medical History:  Diagnosis Date  . Allergic rhinitis   . Anxiety   . Anxiety and depression   . Back pain, chronic   . CAD (coronary artery disease)    palpitations, dizziness, chest pain  . Cervical cancer (HCC)    cervical  . Chronic abdominal pain    with chronic nausea, diarrhea  . COPD (chronic obstructive pulmonary disease) ([place])   . Depression   . GERD (gastroesophageal reflux disease)   . Helicobacter pylori infection 2015   treated with prevpac  . Hematochezia   . History of kidney stones   . Hx of colonic polyps    adenomatous  . Hyperlipidemia    Lipid profile on 02/25/2011: 209, 113, 55, 132  . Nephrolithiasis   . Pancreatitis chronic   . Renal calculus   . Stroke [facility]) 3-4 yrs ago   left sided weakness  . Tobacco abuse   . Tubular adenoma 2015  . Weight loss    CT negative for occult malignancy, negative celiac, negative adrenal insufficiency    Past Surgical History:  Procedure Laterality Date  . ABDOMINAL HYSTERECTOMY      . APPENDECTOMY    . CATARACT EXTRACTION W/PHACO Right 05/04/2013   Procedure: CATARACT EXTRACTION PHACO AND INTRAOCULAR LENS PLACEMENT (IOC);  Surgeon: Tonny Branch, MD;  Location: [facility] ORS;  Service: Ophthalmology;  Laterality: Right;  CDE:16.17  . CATARACT EXTRACTION W/PHACO Left 05/22/2013   Procedure: CATARACT EXTRACTION PHACO AND INTRAOCULAR LENS PLACEMENT (IOC);  Surgeon: Tonny Branch, MD;  Location: [facility] ORS;  Service: Ophthalmology;  Laterality: Left;  CDE:14.75  . COLONOSCOPY N/A 02/08/2014   Dr.Doak Mah- normal rectum, colonic polyps bx=tubular adenoma  . COLONOSCOPY W/ POLYPECTOMY  2006   DZH:[id number], benign gastric nodule, multiple adenomatous polyps, one with tubular morphology  . COLONOSCOPY WITH PROPOFOL N/A 04/15/2017   Procedure: COLONOSCOPY WITH PROPOFOL;  Surgeon: Daneil Dolin, MD;  Location: [facility] ENDO SUITE;  Service: Endoscopy;  Laterality: N/A;  11:15am  . ESOPHAGOGASTRODUODENOSCOPY  2006   DQQ:IWLNLG gastric nodule  . ESOPHAGOGASTRODUODENOSCOPY N/A 02/08/2014   Dr.Reshaun Briseno- abnormal stomach and gstric nodule bx= hpylori  . EUS  2010   Dr. Ardis Hughs : Multiple shadowing calcifications in pancreas, consistent with Chronic Pancreatitis.  These are mainly confined to a collection of calcifications in head/uncinate pancreas. Otherwise the pancreatic parenchyma appears fairly normal.  The main pancreatic duct, CBD are both normal without stones, dilation.    . Ileocolonoscopy  01/11/2009   RMR: Polyp at the splenic flexure, status post hot snare removal/ Normal rectum, tubular adenoma  .  PARTIAL HYSTERECTOMY      Prior to Admission medications   Medication Sig Start Date End Date Taking? Authorizing Provider  ADVAIR DISKUS 250-50 MCG/DOSE AEPB Inhale 1 puff into the lungs 2 (two) times daily.  10/05/12  Yes [provider]  albuterol (PROVENTIL HFA;VENTOLIN HFA) 108 (90 Base) MCG/ACT inhaler Inhale 2 puffs into the lungs every 4 (four) hours as needed for wheezing or shortness of  breath. 02/12/18  Yes Francine Graven, DO  Calcium Carbonate (CALCIUM 600 PO) Take 1 tablet by mouth daily.     Yes [provider]  CREON 24000-76000 units CPEP TAKE 2 CAPSULES WITH EACH MEALS AND 1 CAPSULE WITH SNACKS. 12/30/17  Yes Mahala Menghini, PA-C  docusate sodium (COLACE) 100 MG capsule Take 100 mg by mouth 2 (two) times daily.   Yes [provider]  DULoxetine (CYMBALTA) 60 MG capsule Take 60 mg by mouth 2 (two) times daily.    Yes [provider]  escitalopram (LEXAPRO) 10 MG tablet Take 10 mg by mouth daily. 01/30/17  Yes [provider]  hydrOXYzine (ATARAX/VISTARIL) 25 MG tablet Take 1 tablet by mouth as needed for anxiety or itching.  04/21/16  Yes [provider]  ipratropium (ATROVENT) 0.06 % nasal spray Place 2 sprays into both nostrils 2 (two) times daily as needed for rhinitis.   Yes [provider]  LINZESS 72 MCG capsule TAKE ONE CAPSULE BY MOUTH DAILY BEFORE BREAKFAST. 05/27/18  Yes Mahala Menghini, PA-C  LORazepam (ATIVAN) 1 MG tablet Take 1 mg by mouth every 8 (eight) hours as needed for anxiety. Reported on 11/19/2015   Yes [provider]  nitroGLYCERIN (NITROSTAT) 0.4 MG SL tablet Place 1 tablet (0.4 mg total) under the tongue every 5 (five) minutes as needed. 11/21/14  Yes Herminio Commons, MD  omeprazole (PRILOSEC) 20 MG capsule TAKE 1 CAPSULE BY MOUTH TWICE DAILY. 05/31/18  Yes Carlis Stable, NP  oxyCODONE-acetaminophen (PERCOCET) 7.5-325 MG tablet Take 1 tablet by mouth 4 (four) times daily as needed. 06/02/18  Yes [provider]  potassium chloride 20 MEQ TBCR Take 20 mEq by mouth daily. 04/13/17 06/14/18 Yes Carlis Stable, NP  promethazine (PHENERGAN) 25 MG tablet Take 25 mg by mouth every 6 (six) hours as needed for nausea or vomiting.    Yes [provider]  RESTASIS 0.05 % ophthalmic emulsion Place 1 drop into both eyes 2 (two) times daily. 03/27/16  Yes [provider]  Vitamin D,  Ergocalciferol, (DRISDOL) 50000 units CAPS capsule Take 50,000 Units by mouth every 7 (seven) days.   Yes [provider]  cephALEXin (KEFLEX) 500 MG capsule Take 1 capsule (500 mg total) by mouth 3 (three) times daily. Patient not taking: Reported on 06/14/2018 01/18/18   Horton, Barbette Hair, MD  cilostazol (PLETAL) 100 MG tablet Take 100 mg by mouth 2 (two) times daily.  03/29/15   [provider]  levofloxacin (LEVAQUIN) 750 MG tablet Take 1 tablet (750 mg total) by mouth daily. Patient not taking: Reported on 06/14/2018 02/12/18   Francine Graven, DO  LINZESS 145 MCG CAPS capsule Take 1 capsule by mouth daily. 04/16/17   [provider]  oxyCODONE-acetaminophen (PERCOCET/ROXICET) 5-325 MG tablet Take 1 tablet by mouth every 6 (six) hours as needed for severe pain.    [provider]  potassium chloride SA (K-DUR,KLOR-CON) 20 MEQ tablet Take 20 mEq by mouth daily.    [provider]  predniSONE (DELTASONE) 20 MG tablet  Take 2 tablets (40 mg total) by mouth daily. Patient not taking: Reported on 06/14/2018 02/12/18   Francine Graven, DO  PROAIR HFA 108 [phone] Base) MCG/ACT inhaler Inhale 2 puffs into the lungs every 4 (four) hours as needed for shortness of breath or wheezing. 01/30/17   [provider]    Allergies as of 06/14/2018 - Review Complete 06/14/2018  Allergen Reaction Noted  . Ibuprofen Anaphylaxis and Hives 12/19/2008  . Iohexol Hives and Swelling 05/23/2004  . Other Itching 11/09/2015  . Strawberry (diagnostic) Itching 11/09/2015  . Strawberry extract Itching 09/29/2017    Family History  Problem Relation Age of Onset  . Heart attack Father 76  . Colon cancer Maternal Grandmother     Social History   Socioeconomic History  . Marital status: Divorced    Spouse name: Not on file  . Number of children: 3  . Years of education: Not on file  . Highest education level: Not on file  Occupational History  . Occupation: disabled      Fish farm manager: UNEMPLOYED  Social Needs  . Financial resource strain: Not on file  . Food insecurity:    Worry: Not on file    Inability: Not on file  . Transportation needs:    Medical: Not on file    Non-medical: Not on file  Tobacco Use  . Smoking status: Current Every Day Smoker    Packs/day: 0.50    Years: 50.00    Pack years: 25.00    Types: Cigarettes    Start date: 07/18/1962  . Smokeless tobacco: Never Used  . Tobacco comment: 3/4 pack daily  Substance and Sexual Activity  . Alcohol use: No    Alcohol/week: 0.0 standard drinks    Comment: hx of ETOH about 20 years ago.   . Drug use: No  . Sexual activity: Never    Birth control/protection: Surgical  Lifestyle  . Physical activity:    Days per week: Not on file    Minutes per session: Not on file  . Stress: Not on file  Relationships  . Social connections:    Talks on phone: Not on file    Gets together: Not on file    Attends religious service: Not on file    Active member of club or organization: Not on file    Attends meetings of clubs or organizations: Not on file    Relationship status: Not on file  . Intimate partner violence:    Fear of current or ex partner: Not on file    Emotionally abused: Not on file    Physically abused: Not on file    Forced sexual activity: Not on file  Other Topics Concern  . Not on file  Social History Narrative  . Not on file    Review of Systems: See HPI, otherwise negative ROS  Physical Exam: BP 110/71   Pulse 99   Temp (!) 97 F (36.1 C) (Oral)   Ht 5\' 4"  (1.626 m)   Wt 76 lb (34.5 kg)   BMI 13.05 kg/m  General:   Alert,  Chronically ill-appearing cachectic lady (her baseline appearance now for years). Skin:  Intact without significant lesions or rashes. Lungs:  Clear throughout to auscultation.   No wheezes, crackles, or rhonchi. No acute distress. Heart:  Regular rate and rhythm; no murmurs, clicks, rubs,  or gallops. Abdomen: Non-distended, normal bowel  sounds.  Soft and nontender without appreciable mass or hepatosplenomegaly.  Pulses:  Normal pulses noted.  Extremities:  Without clubbing or edema.  Impression/Plan:  72 year old lady with chronic pancreatitis with secondary exocrine insufficiency, cachexia and opioid-induced constipation. Symptoms stable, if not, somewhat improved recently.   Recommendations: Continue omeprazole 20 mg daily  Continue linzess 72 daily  Continue Creon dailly  Pain medication per Dr. Karie Kirks  Office visit in 6 months     Notice: This dictation was prepared with Dragon dictation along with smaller phrase technology. Any transcriptional errors that result from this process are unintentional and may not be corrected upon review.

## 2018-06-20 ENCOUNTER — Ambulatory Visit (INDEPENDENT_AMBULATORY_CARE_PROVIDER_SITE_OTHER): Payer: Medicare Other | Admitting: Otolaryngology

## 2018-06-20 DIAGNOSIS — K219 Gastro-esophageal reflux disease without esophagitis: Secondary | ICD-10-CM

## 2018-06-20 DIAGNOSIS — F17210 Nicotine dependence, cigarettes, uncomplicated: Secondary | ICD-10-CM | POA: Diagnosis not present

## 2018-06-20 DIAGNOSIS — J382 Nodules of vocal cords: Secondary | ICD-10-CM

## 2018-06-20 DIAGNOSIS — R490 Dysphonia: Secondary | ICD-10-CM | POA: Diagnosis not present

## 2018-07-10 ENCOUNTER — Emergency Department (HOSPITAL_COMMUNITY): Payer: Medicare Other

## 2018-07-10 ENCOUNTER — Encounter (HOSPITAL_COMMUNITY): Payer: Self-pay | Admitting: Emergency Medicine

## 2018-07-10 ENCOUNTER — Other Ambulatory Visit: Payer: Self-pay

## 2018-07-10 ENCOUNTER — Emergency Department (HOSPITAL_COMMUNITY)
Admission: EM | Admit: 2018-07-10 | Discharge: 2018-07-10 | Disposition: A | Discharge status: Home / Self Care | Payer: Medicare Other | Attending: Emergency Medicine | Admitting: Emergency Medicine

## 2018-07-10 DIAGNOSIS — I2510 Atherosclerotic heart disease of native coronary artery without angina pectoris: Secondary | ICD-10-CM | POA: Diagnosis not present

## 2018-07-10 DIAGNOSIS — F17210 Nicotine dependence, cigarettes, uncomplicated: Secondary | ICD-10-CM | POA: Insufficient documentation

## 2018-07-10 DIAGNOSIS — Z79899 Other long term (current) drug therapy: Secondary | ICD-10-CM | POA: Diagnosis not present

## 2018-07-10 DIAGNOSIS — R05 Cough: Secondary | ICD-10-CM | POA: Diagnosis present

## 2018-07-10 DIAGNOSIS — J441 Chronic obstructive pulmonary disease with (acute) exacerbation: Secondary | ICD-10-CM | POA: Insufficient documentation

## 2018-07-10 DIAGNOSIS — Z8541 Personal history of malignant neoplasm of cervix uteri: Secondary | ICD-10-CM | POA: Diagnosis not present

## 2018-07-10 MED ORDER — IPRATROPIUM-ALBUTEROL 0.5-2.5 (3) MG/3ML IN SOLN
3.0000 mL | Freq: Once | RESPIRATORY_TRACT | Status: AC
  Administered 2018-07-10: 3 mL via RESPIRATORY_TRACT
  Filled 2018-07-10: qty 3

## 2018-07-10 MED ORDER — GUAIFENESIN-CODEINE 100-10 MG/5ML PO SOLN: 5.0000 mL | Freq: Three times a day (TID) | ORAL | 0 refills | Status: DC | PRN

## 2018-07-10 MED ORDER — PREDNISONE 20 MG PO TABS
20.0000 mg | ORAL_TABLET | Freq: Once | ORAL | Status: AC
  Administered 2018-07-10: 20 mg via ORAL
  Filled 2018-07-10: qty 1

## 2018-07-10 MED ORDER — PREDNISONE 20 MG PO TABS: 20.0000 mg | ORAL_TABLET | Freq: Every day | ORAL | 0 refills | Status: DC

## 2018-07-10 NOTE — ED Triage Notes (Signed)
Pt c/o cough, nasal congestion, chills, intermittent episodes of constipation and diarrhea, and generalized weakness x 5 days. Also endorses loss of appetite.

## 2018-07-10 NOTE — ED Provider Notes (Signed)
[facility] EMERGENCY DEPARTMENT Provider Note   CSN: [id number] Arrival date & time: 07/10/18  1554     History   Chief Complaint Chief Complaint  Patient presents with  . Cough    HPI [name] is a 72 y.o. female.  HPI   72 year old female with cough and congestion.  Onset about a week ago.  Cough is occasionally productive for yellow sputum.  Mild shortness of breath.  No fevers or chills.  Sometimes some sharp pain when she coughs deeply.  No unusual leg pain or swelling. History of COPD.  She is not on home oxygen.  She states she does get some relief of her symptoms with her beta agonist.  Past Medical History:  Diagnosis Date  . Allergic rhinitis   . Anxiety   . Anxiety and depression   . Back pain, chronic   . CAD (coronary artery disease)    palpitations, dizziness, chest pain  . Cervical cancer (HCC)    cervical  . Chronic abdominal pain    with chronic nausea, diarrhea  . COPD (chronic obstructive pulmonary disease) ([place])   . Depression   . GERD (gastroesophageal reflux disease)   . Helicobacter pylori infection 2015   treated with prevpac  . Hematochezia   . History of kidney stones   . Hx of colonic polyps    adenomatous  . Hyperlipidemia    Lipid profile on 02/25/2011: 209, 113, 55, 132  . Nephrolithiasis   . Pancreatitis chronic   . Renal calculus   . Stroke [facility]) 3-4 yrs ago   left sided weakness  . Tobacco abuse   . Tubular adenoma 2015  . Weight loss    CT negative for occult malignancy, negative celiac, negative adrenal insufficiency    Patient Active Problem List   Diagnosis Date Noted  . Diarrhea 06/09/2017  . Colitis 07/01/2016  . RUQ pain 10/19/2012  . Nausea with vomiting 05/07/2012  . Constipation 05/07/2012  . Laboratory test 02/17/2012  . Hyperlipidemia   . Chest pain 01/07/2011  . ANOREXIA 05/27/2010  . WEIGHT LOSS 05/27/2010  . COLONIC POLYPS, ADENOMATOUS, HX OF 12/20/2008  . ANXIETY DEPRESSION 12/19/2008  .  Tobacco abuse 12/19/2008  . ALLERGIC RHINITIS, SEASONAL 12/19/2008  . COPD 12/19/2008  . GERD 12/19/2008  . Chronic pancreatitis ([place]) 12/19/2008  . BACK PAIN, CHRONIC 12/19/2008  . Abdominal pain 12/19/2008  . CERVICAL CANCER, HX OF 12/19/2008  . Nephrolithiasis 12/19/2008    Past Surgical History:  Procedure Laterality Date  . ABDOMINAL HYSTERECTOMY    . APPENDECTOMY    . CATARACT EXTRACTION W/PHACO Right 05/04/2013   Procedure: CATARACT EXTRACTION PHACO AND INTRAOCULAR LENS PLACEMENT (IOC);  Surgeon: Tonny Branch, MD;  Location: [facility] ORS;  Service: Ophthalmology;  Laterality: Right;  CDE:16.17  . CATARACT EXTRACTION W/PHACO Left 05/22/2013   Procedure: CATARACT EXTRACTION PHACO AND INTRAOCULAR LENS PLACEMENT (IOC);  Surgeon: Tonny Branch, MD;  Location: [facility] ORS;  Service: Ophthalmology;  Laterality: Left;  CDE:14.75  . COLONOSCOPY N/A 02/08/2014   Dr.Rourk- normal rectum, colonic polyps bx=tubular adenoma  . COLONOSCOPY W/ POLYPECTOMY  2006   [id number], benign gastric nodule, multiple adenomatous polyps, one with tubular morphology  . COLONOSCOPY WITH PROPOFOL N/A 04/15/2017   Procedure: COLONOSCOPY WITH PROPOFOL;  Surgeon: Daneil Dolin, MD;  Location: [facility] ENDO SUITE;  Service: Endoscopy;  Laterality: N/A;  11:15am  . ESOPHAGOGASTRODUODENOSCOPY  2006   WLN:[id number] gastric nodule  . ESOPHAGOGASTRODUODENOSCOPY N/A 02/08/2014   Dr.Rourk- abnormal stomach and  gstric nodule bx= hpylori  . EUS  2010   Dr. Ardis Hughs : Multiple shadowing calcifications in pancreas, consistent with Chronic Pancreatitis.  These are mainly confined to a collection of calcifications in head/uncinate pancreas. Otherwise the pancreatic parenchyma appears fairly normal.  The main pancreatic duct, CBD are both normal without stones, dilation.    . Ileocolonoscopy  01/11/2009   RMR: Polyp at the splenic flexure, status post hot snare removal/ Normal rectum, tubular adenoma  . PARTIAL HYSTERECTOMY       OB History     Gravida  6   Para  3   Term  3   Preterm      AB  3   Living        SAB  3   TAB      Ectopic      Multiple      Live Births               Home Medications    Prior to Admission medications   Medication Sig Start Date End Date Taking? Authorizing Provider  ADVAIR DISKUS 250-50 MCG/DOSE AEPB Inhale 1 puff into the lungs 2 (two) times daily.  10/05/12   [provider]  albuterol (PROVENTIL HFA;VENTOLIN HFA) 108 (90 Base) MCG/ACT inhaler Inhale 2 puffs into the lungs every 4 (four) hours as needed for wheezing or shortness of breath. 02/12/18   Francine Graven, DO  Calcium Carbonate (CALCIUM 600 PO) Take 1 tablet by mouth daily.      [provider]  cephALEXin (KEFLEX) 500 MG capsule Take 1 capsule (500 mg total) by mouth 3 (three) times daily. Patient not taking: Reported on 06/14/2018 01/18/18   Horton, Barbette Hair, MD  cilostazol (PLETAL) 100 MG tablet Take 100 mg by mouth 2 (two) times daily.  03/29/15   [provider]  CREON 24000-76000 units CPEP TAKE 2 CAPSULES WITH EACH MEALS AND 1 CAPSULE WITH SNACKS. 12/30/17   Mahala Menghini, PA-C  docusate sodium (COLACE) 100 MG capsule Take 100 mg by mouth 2 (two) times daily.    [provider]  DULoxetine (CYMBALTA) 60 MG capsule Take 60 mg by mouth 2 (two) times daily.     [provider]  escitalopram (LEXAPRO) 10 MG tablet Take 10 mg by mouth daily. 01/30/17   [provider]  hydrOXYzine (ATARAX/VISTARIL) 25 MG tablet Take 1 tablet by mouth as needed for anxiety or itching.  04/21/16   [provider]  ipratropium (ATROVENT) 0.06 % nasal spray Place 2 sprays into both nostrils 2 (two) times daily as needed for rhinitis.    [provider]  levofloxacin (LEVAQUIN) 750 MG tablet Take 1 tablet (750 mg total) by mouth daily. Patient not taking: Reported on 06/14/2018 02/12/18   Francine Graven, DO  LINZESS 145 MCG CAPS capsule Take 1 capsule by mouth daily.  04/16/17   [provider]  LINZESS 72 MCG capsule TAKE ONE CAPSULE BY MOUTH DAILY BEFORE BREAKFAST. 05/27/18   Mahala Menghini, PA-C  LORazepam (ATIVAN) 1 MG tablet Take 1 mg by mouth every 8 (eight) hours as needed for anxiety. Reported on 11/19/2015    [provider]  nitroGLYCERIN (NITROSTAT) 0.4 MG SL tablet Place 1 tablet (0.4 mg total) under the tongue every 5 (five) minutes as needed. 11/21/14   Herminio Commons, MD  omeprazole (PRILOSEC) 20 MG capsule TAKE 1 CAPSULE BY MOUTH TWICE DAILY. 05/31/18   Carlis Stable, NP  oxyCODONE-acetaminophen (PERCOCET)  7.5-325 MG tablet Take 1 tablet by mouth 4 (four) times daily as needed. 06/02/18   [provider]  oxyCODONE-acetaminophen (PERCOCET/ROXICET) 5-325 MG tablet Take 1 tablet by mouth every 6 (six) hours as needed for severe pain.    [provider]  potassium chloride 20 MEQ TBCR Take 20 mEq by mouth daily. 04/13/17 06/14/18  Carlis Stable, NP  potassium chloride SA (K-DUR,KLOR-CON) 20 MEQ tablet Take 20 mEq by mouth daily.    [provider]  predniSONE (DELTASONE) 20 MG tablet Take 2 tablets (40 mg total) by mouth daily. Patient not taking: Reported on 06/14/2018 02/12/18   Francine Graven, DO  PROAIR HFA 108 [id number] Base) MCG/ACT inhaler Inhale 2 puffs into the lungs every 4 (four) hours as needed for shortness of breath or wheezing. 01/30/17   [provider]  promethazine (PHENERGAN) 25 MG tablet Take 25 mg by mouth every 6 (six) hours as needed for nausea or vomiting.     [provider]  RESTASIS 0.05 % ophthalmic emulsion Place 1 drop into both eyes 2 (two) times daily. 03/27/16   [provider]  Vitamin D, Ergocalciferol, (DRISDOL) 50000 units CAPS capsule Take 50,000 Units by mouth every 7 (seven) days.    [provider]    Family History Family History  Problem Relation Age of Onset  . Heart attack Father 20  . Colon cancer Maternal Grandmother     Social  History Social History   Tobacco Use  . Smoking status: Current Every Day Smoker    Packs/day: 0.50    Years: 50.00    Pack years: 25.00    Types: Cigarettes    Start date: 07/18/1962  . Smokeless tobacco: Never Used  . Tobacco comment: 3/4 pack daily  Substance Use Topics  . Alcohol use: No    Alcohol/week: 0.0 standard drinks    Comment: hx of ETOH about 20 years ago.   . Drug use: No     Allergies   Ibuprofen; Iohexol; Other; Strawberry (diagnostic); and Strawberry extract   Review of Systems Review of Systems  All systems reviewed and negative, other than as noted in HPI.  Physical Exam Updated Vital Signs BP (!) 124/102 (BP Location: Right Arm)   Pulse 86   Temp 98 F (36.7 C) (Oral)   Resp 18   Ht 5\' 3"  (1.6 m)   Wt 34.5 kg   SpO2 96%   BMI 13.46 kg/m   Physical Exam  Constitutional: She appears well-developed. No distress.  Laying in bed.  Very thin/frail appearing but not distressed.  HENT:  Head: Normocephalic and atraumatic.  Eyes: Conjunctivae are normal. Right eye exhibits no discharge. Left eye exhibits no discharge.  Neck: Neck supple.  Cardiovascular: Normal rate, regular rhythm and normal heart sounds. Exam reveals no gallop and no friction rub.  No murmur heard. Pulmonary/Chest: Effort normal. No respiratory distress. She has wheezes.  And expiratory wheezing bilaterally.  No increased work of breathing.  Able speak in complete sentences.  Abdominal: Soft. She exhibits no distension. There is no tenderness.  Musculoskeletal: She exhibits no edema or tenderness.  Lower extremities symmetric as compared to each other. No calf tenderness. Negative Homan's. No palpable cords.   Neurological: She is alert.  Skin: Skin is warm and dry.  Psychiatric: She has a normal mood and affect. Her behavior is normal. Thought content normal.  Nursing note and vitals reviewed.    ED Treatments / Results  Labs (all labs  ordered are listed, but only  abnormal results are displayed) Labs Reviewed - No data to display  EKG None  Radiology No results found.  Procedures Procedures (including critical care time)  Medications Ordered in ED Medications  ipratropium-albuterol (DUONEB) 0.5-2.5 (3) MG/3ML nebulizer solution 3 mL (has no administration in time range)  predniSONE (DELTASONE) tablet 20 mg (has no administration in time range)     Initial Impression / Assessment and Plan / ED Course  I have reviewed the triage vital signs and the nursing notes.  Pertinent labs & imaging results that were available during my care of the patient were reviewed by me and considered in my medical decision making (see chart for details).     Copd exacerbation. Doubt pneumonia, PE or other emergent process. Steroids. PRN cough medication.   Final Clinical Impressions(s) / ED Diagnoses   Final diagnoses:  COPD exacerbation [facility])    ED Discharge Orders         Ordered    predniSONE (DELTASONE) 20 MG tablet  Daily     07/10/18 1726    guaiFENesin-codeine 100-10 MG/5ML syrup  3 times daily PRN     07/10/18 1726           Virgel Manifold, MD 07/19/18 1725

## 2018-07-10 NOTE — ED Notes (Signed)
Patient transported to X-ray 

## 2018-07-10 NOTE — ED Notes (Addendum)
Pt reports she recently had pancreatitis followed by bouts of constipation and diarrhea, which pt reports is her usual. Pt also reports of loss appetite since the pancreatitis started and has been drinking Ensure. Pt reports using OTC cough and sinus medication without relief of symptoms.

## 2018-11-08 ENCOUNTER — Encounter: Payer: Self-pay | Admitting: Internal Medicine

## 2019-02-07 ENCOUNTER — Encounter: Payer: Self-pay | Admitting: Internal Medicine

## 2019-02-07 ENCOUNTER — Other Ambulatory Visit: Payer: Self-pay

## 2019-02-07 ENCOUNTER — Ambulatory Visit (INDEPENDENT_AMBULATORY_CARE_PROVIDER_SITE_OTHER): Payer: Medicare Other | Admitting: Internal Medicine

## 2019-02-07 DIAGNOSIS — R627 Adult failure to thrive: Secondary | ICD-10-CM | POA: Diagnosis not present

## 2019-02-07 DIAGNOSIS — R6251 Failure to thrive (child): Secondary | ICD-10-CM

## 2019-02-07 DIAGNOSIS — K8689 Other specified diseases of pancreas: Secondary | ICD-10-CM

## 2019-02-07 NOTE — Progress Notes (Signed)
Referring Provider:  Primary Care Physician:  Lemmie Evens, MD  Primary GI:   Virtual Visit via Telephone Note Due to COVID-19, visit is conducted virtually and was requested by patient.   I connected with [name] on 02/07/19 at  3:00 PM EDT by telephone and verified that I am speaking with the correct person using two identifiers.   I discussed the limitations, risks, security and privacy concerns of performing an evaluation and management service by telephone and the availability of in person appointments. I also discussed with the patient that there may be a patient responsible charge related to this service. The patient expressed understanding and agreed to proceed.  Chief Complaint  Patient presents with  . Gastroesophageal Reflux    f/u. Not having issues at the moment  . Abdominal Pain    "all the time" but more in the side     History of Present Illness:   73 year old lady with advanced COPD, chronic pancreatitis, failure to thrive.  States she is actually been doing pretty good lately.  She staying end of her biggest issue is allergies.  She is on her Creon and omeprazole.  Bowel function is normal.  She denies diarrhea.  States appetite is good.  Maintaining weight.  States she is weighs in the 75 pound range.  States she never weighed more than 84..  Sees Dr. Karie Kirks regarding management of allergies.   Past Medical History:  Diagnosis Date  . Allergic rhinitis   . Anxiety   . Anxiety and depression   . Back pain, chronic   . CAD (coronary artery disease)    palpitations, dizziness, chest pain  . Cervical cancer (HCC)    cervical  . Chronic abdominal pain    with chronic nausea, diarrhea  . COPD (chronic obstructive pulmonary disease) ([place])   . Depression   . GERD (gastroesophageal reflux disease)   . Helicobacter pylori infection 2015   treated with prevpac  . Hematochezia   . History of kidney stones   . Hx of colonic polyps    adenomatous  .  Hyperlipidemia    Lipid profile on 02/25/2011: 209, 113, 55, 132  . Nephrolithiasis   . Pancreatitis chronic   . Renal calculus   . Stroke [facility]) 3-4 yrs ago   left sided weakness  . Tobacco abuse   . Tubular adenoma 2015  . Weight loss    CT negative for occult malignancy, negative celiac, negative adrenal insufficiency     Past Surgical History:  Procedure Laterality Date  . ABDOMINAL HYSTERECTOMY    . APPENDECTOMY    . CATARACT EXTRACTION W/PHACO Right 05/04/2013   Procedure: CATARACT EXTRACTION PHACO AND INTRAOCULAR LENS PLACEMENT (IOC);  Surgeon: Tonny Branch, MD;  Location: [facility] OR[facility];  Service: Ophthalmology;  Laterality: Right;  CDE:16.17  . CATARACT EXTRACTION W/PHACO Left 05/22/2013   Procedure: CATARACT EXTRACTION PHACO AND INTRAOCULAR LENS PLACEMENT (IOC);  Surgeon: Tonny Branch, MD;  Location: [facility] ORS;  Service: Ophthalmology;  Laterality: Left;  CDE:14.75  . COLONOSCOPY N/A 02/08/2014   Dr.Dusten Ellinwood- normal rectum, colonic polyps bx=tubular adenoma  . COLONOSCOPY W/ POLYPECTOMY  2006   [id number]:[id number], benign gastric nodule, multiple adenomatous polyps, one with tubular morphology  . COLONOSCOPY WITH PROPOFOL N/A 04/15/2017   Procedure: COLONOSCOPY WITH PROPOFOL;  Surgeon: Daneil Dolin, MD;  Location: [facility] END[facility] SU[facility];  Service: Endoscopy;  Laterality: N/A;  11:15am  . ESOPHAGOGASTRODUODENOSCOPY  2006   RCV:[id number] gastric nodule  . ESOPHAGOGASTRODUODENOSCOPY N/A 02/08/2014  Dr.Particia Strahm- abnormal stomach and gstric nodule bx= hpylori  . EUS  2010   Dr. Ardis Hughs : Multiple shadowing calcifications in pancreas, consistent with Chronic Pancreatitis.  These are mainly confined to a collection of calcifications in head/uncinate pancreas. Otherwise the pancreatic parenchyma appears fairly normal.  The main pancreatic duct, CBD are both normal without stones, dilation.    . Ileocolonoscopy  01/11/2009   RMR: Polyp at the splenic flexure, status post hot snare removal/ Normal rectum, tubular adenoma   . PARTIAL HYSTERECTOMY       Current Meds  Medication Sig  . albuterol (PROVENTIL HFA;VENTOLIN HFA) 108 (90 Base) MCG/ACT inhaler Inhale 2 puffs into the lungs every 4 (four) hours as needed for wheezing or shortness of breath.  . Calcium Carbonate (CALCIUM 600 PO) Take 1 tablet by mouth daily.    . cilostazol (PLETAL) 100 MG tablet Take 100 mg by mouth 2 (two) times daily.   Marland Kitchen CREON 24000-76000 units CPEP TAKE 2 CAPSULES WITH EACH MEALS AND 1 CAPSULE WITH SNACKS. (Patient taking differently: Take 1-2 capsules by mouth See admin instructions. 2 capsules with meals and 1 with snacks)  . cyclobenzaprine (FLEXERIL) 5 MG tablet Take 5 mg by mouth at bedtime.  . docusate sodium (COLACE) 100 MG capsule Take 100 mg by mouth 2 (two) times daily.  . DULoxetine (CYMBALTA) 60 MG capsule Take 60 mg by mouth 2 (two) times daily.   Marland Kitchen escitalopram (LEXAPRO) 10 MG tablet Take 10 mg by mouth daily.  . Fluticasone-Salmeterol (WIXELA INHUB) 250-50 MCG/DOSE AEPB Inhale 1 puff into the lungs 2 (two) times daily.  Marland Kitchen guaiFENesin (ROBITUSSIN) 100 MG/5ML SOLN Take 5 mLs by mouth every 4 (four) hours as needed for cough or to loosen phlegm.  Marland Kitchen guaiFENesin-codeine 100-10 MG/5ML syrup Take 5-10 mLs by mouth 3 (three) times daily as needed for cough.  . hydrOXYzine (ATARAX/VISTARIL) 25 MG tablet Take 1 tablet by mouth as needed for anxiety or itching.   Marland Kitchen LINZESS 72 MCG capsule TAKE ONE CAPSULE BY MOUTH DAILY BEFORE BREAKFAST. (Patient taking differently: Take 72 mcg by mouth daily before breakfast. )  . LORazepam (ATIVAN) 1 MG tablet Take 1 mg by mouth every 8 (eight) hours as needed for anxiety. Reported on 11/19/2015  . Misc Natural Products (SINUS FORMULA) TABS Take 1-2 tablets by mouth daily as needed (for cough and congestion).  . nitroGLYCERIN (NITROSTAT) 0.4 MG SL tablet Place 1 tablet (0.4 mg total) under the tongue every 5 (five) minutes as needed.  Marland Kitchen omeprazole (PRILOSEC) 20 MG capsule TAKE 1 CAPSULE BY MOUTH  TWICE DAILY. (Patient taking differently: Take 20 mg by mouth 2 (two) times daily. )  . oxyCODONE-acetaminophen (PERCOCET) 7.5-325 MG tablet Take 1 tablet by mouth 4 (four) times daily as needed for moderate pain or severe pain.   . potassium chloride SA (K-DUR,KLOR-CON) 20 MEQ tablet Take 20 mEq by mouth daily.  . predniSONE (DELTASONE) 20 MG tablet Take 1 tablet (20 mg total) by mouth daily.  Marland Kitchen PROLIA 60 MG/ML SOSY injection Inject 60 mg into the skin every 6 (six) months.   . promethazine (PHENERGAN) 25 MG tablet Take 25 mg by mouth every 6 (six) hours as needed for nausea or vomiting.   . RESTASIS 0.05 % ophthalmic emulsion Place 1 drop into both eyes 2 (two) times daily.  . Vitamin D, Ergocalciferol, (DRISDOL) 50000 units CAPS capsule Take 50,000 Units by mouth every Tuesday.      Family History  Problem Relation Age of  Onset  . Heart attack Father 33  . Colon cancer Maternal Grandmother     Social History   Socioeconomic History  . Marital status: Divorced    Spouse name: Not on file  . Number of children: 3  . Years of education: Not on file  . Highest education level: Not on file  Occupational History  . Occupation: disabled    Fish farm manager: UNEMPLOYED  Social Needs  . Financial resource strain: Not on file  . Food insecurity:    Worry: Not on file    Inability: Not on file  . Transportation needs:    Medical: Not on file    Non-medical: Not on file  Tobacco Use  . Smoking status: Current Every Day Smoker    Packs/day: 0.50    Years: 50.00    Pack years: 25.00    Types: Cigarettes    Start date: 07/18/1962  . Smokeless tobacco: Never Used  . Tobacco comment: 3/4 pack daily  Substance and Sexual Activity  . Alcohol use: No    Alcohol/week: 0.0 standard drinks    Comment: hx of ETOH about 20 years ago.   . Drug use: No  . Sexual activity: Never    Birth control/protection: Surgical  Lifestyle  . Physical activity:    Days per week: Not on file    Minutes per  session: Not on file  . Stress: Not on file  Relationships  . Social connections:    Talks on phone: Not on file    Gets together: Not on file    Attends religious service: Not on file    Active member of club or organization: Not on file    Attends meetings of clubs or organizations: Not on file    Relationship status: Not on file  Other Topics Concern  . Not on file  Social History Narrative  . Not on file       Review of Systems: As in history of present illness.  Observations/Objective: No distress. Unable to perform physical exam due to telephone encounter. No video available.   Assessment and Plan:   Pleasant 73 year old lady with a history of failure to thrive in part related to chronic pancreatitis and in part to other comorbidities (including COPD).  All in all, GI symptoms are quiescent at this time. \  Follow Up Instructions:   Continue omeprazole and Creon  Continue good nutritional intake.  Plan for a face-to-face office visit in 12 weeks.  If any interim problems please call me.     I discussed the assessment and treatment plan with the patient. The patient was provided an opportunity to ask questions and all were answered. The patient agreed with the plan and demonstrated an understanding of the instructions.   The patient was advised to call back or seek an in-person evaluation if the symptoms worsen or if the condition fails to improve as anticipated.  I provided 9 minutes of non-face-to-face time during this encounter.  [facility]

## 2019-02-07 NOTE — Patient Instructions (Signed)
Continue omeprazole and Creon  Continue good nutritional intake.  Plan for a face-to-face office visit in 12 weeks.  If any interim problems please call me.

## 2019-02-15 ENCOUNTER — Other Ambulatory Visit: Payer: Self-pay | Admitting: Nurse Practitioner

## 2019-02-15 DIAGNOSIS — K219 Gastro-esophageal reflux disease without esophagitis: Secondary | ICD-10-CM

## 2019-02-15 DIAGNOSIS — R634 Abnormal weight loss: Secondary | ICD-10-CM

## 2019-02-15 DIAGNOSIS — R1011 Right upper quadrant pain: Secondary | ICD-10-CM

## 2019-02-15 DIAGNOSIS — K861 Other chronic pancreatitis: Secondary | ICD-10-CM

## 2019-02-15 DIAGNOSIS — R112 Nausea with vomiting, unspecified: Secondary | ICD-10-CM

## 2019-02-16 ENCOUNTER — Other Ambulatory Visit: Payer: Self-pay | Admitting: Gastroenterology

## 2019-02-16 DIAGNOSIS — K861 Other chronic pancreatitis: Secondary | ICD-10-CM

## 2019-02-16 DIAGNOSIS — K219 Gastro-esophageal reflux disease without esophagitis: Secondary | ICD-10-CM

## 2019-02-16 DIAGNOSIS — R634 Abnormal weight loss: Secondary | ICD-10-CM

## 2019-02-16 DIAGNOSIS — R112 Nausea with vomiting, unspecified: Secondary | ICD-10-CM

## 2019-02-16 DIAGNOSIS — R1011 Right upper quadrant pain: Secondary | ICD-10-CM

## 2019-03-25 ENCOUNTER — Other Ambulatory Visit: Payer: Self-pay | Admitting: Gastroenterology

## 2019-04-08 ENCOUNTER — Emergency Department (HOSPITAL_COMMUNITY): Payer: Medicare Other

## 2019-04-08 ENCOUNTER — Other Ambulatory Visit: Payer: Self-pay

## 2019-04-08 ENCOUNTER — Encounter (HOSPITAL_COMMUNITY): Payer: Self-pay | Admitting: Emergency Medicine

## 2019-04-08 ENCOUNTER — Emergency Department (HOSPITAL_COMMUNITY)
Admission: EM | Admit: 2019-04-08 | Discharge: 2019-04-08 | Disposition: A | Discharge status: Home / Self Care | Payer: Medicare Other | Attending: Emergency Medicine | Admitting: Emergency Medicine

## 2019-04-08 DIAGNOSIS — I2510 Atherosclerotic heart disease of native coronary artery without angina pectoris: Secondary | ICD-10-CM | POA: Insufficient documentation

## 2019-04-08 DIAGNOSIS — Z79899 Other long term (current) drug therapy: Secondary | ICD-10-CM | POA: Diagnosis not present

## 2019-04-08 DIAGNOSIS — J449 Chronic obstructive pulmonary disease, unspecified: Secondary | ICD-10-CM | POA: Diagnosis not present

## 2019-04-08 DIAGNOSIS — K5900 Constipation, unspecified: Secondary | ICD-10-CM | POA: Diagnosis not present

## 2019-04-08 DIAGNOSIS — F17210 Nicotine dependence, cigarettes, uncomplicated: Secondary | ICD-10-CM | POA: Insufficient documentation

## 2019-04-08 DIAGNOSIS — R1030 Lower abdominal pain, unspecified: Secondary | ICD-10-CM

## 2019-04-08 DIAGNOSIS — Z8673 Personal history of transient ischemic attack (TIA), and cerebral infarction without residual deficits: Secondary | ICD-10-CM | POA: Insufficient documentation

## 2019-04-08 DIAGNOSIS — Z8541 Personal history of malignant neoplasm of cervix uteri: Secondary | ICD-10-CM | POA: Diagnosis not present

## 2019-04-08 DIAGNOSIS — R1032 Left lower quadrant pain: Secondary | ICD-10-CM | POA: Diagnosis present

## 2019-04-08 LAB — CBC WITH DIFFERENTIAL/PLATELET
Abs Immature Granulocytes: 0.29 10*3/uL — ABNORMAL HIGH (ref 0.00–0.07)
Basophils Absolute: 0 10*3/uL (ref 0.0–0.1)
Basophils Relative: 0 %
Eosinophils Absolute: 0 10*3/uL (ref 0.0–0.5)
Eosinophils Relative: 0 %
HCT: 41.4 % (ref 36.0–46.0)
Hemoglobin: 14.1 g/dL (ref 12.0–15.0)
Immature Granulocytes: 2 %
Lymphocytes Relative: 10 %
Lymphs Abs: 1.3 10*3/uL (ref 0.7–4.0)
MCH: 32 pg (ref 26.0–34.0)
MCHC: 34.1 g/dL (ref 30.0–36.0)
MCV: 94.1 fL (ref 80.0–100.0)
Monocytes Absolute: 1 10*3/uL (ref 0.1–1.0)
Monocytes Relative: 7 %
Neutro Abs: 10.3 10*3/uL — ABNORMAL HIGH (ref 1.7–7.7)
Neutrophils Relative %: 81 %
Platelets: 355 10*3/uL (ref 150–400)
RBC: 4.4 MIL/uL (ref 3.87–5.11)
RDW: 13.6 % (ref 11.5–15.5)
WBC: 12.9 10*3/uL — ABNORMAL HIGH (ref 4.0–10.5)
nRBC: 0 % (ref 0.0–0.2)

## 2019-04-08 LAB — BASIC METABOLIC PANEL
Anion gap: 8 (ref 5–15)
BUN: 24 mg/dL — ABNORMAL HIGH (ref 8–23)
CO2: 28 mmol/L (ref 22–32)
Calcium: 8.5 mg/dL — ABNORMAL LOW (ref 8.9–10.3)
Chloride: 100 mmol/L (ref 98–111)
Creatinine, Ser: 0.47 mg/dL (ref 0.44–1.00)
GFR calc Af Amer: >60 mL/min (ref >60)
GFR calc non Af Amer: >60 mL/min (ref >60)
Glucose, Bld: 138 mg/dL — ABNORMAL HIGH (ref 70–99)
Potassium: 3.4 mmol/L — ABNORMAL LOW (ref 3.5–5.1)
Sodium: 136 mmol/L (ref 135–145)

## 2019-04-08 LAB — URINALYSIS, ROUTINE W REFLEX MICROSCOPIC
Bacteria, UA: NONE SEEN
Bilirubin Urine: NEGATIVE
Glucose, UA: NEGATIVE mg/dL
Ketones, ur: 5 mg/dL — AB
Leukocytes,Ua: NEGATIVE
Nitrite: NEGATIVE
Protein, ur: NEGATIVE mg/dL
Specific Gravity, Urine: 1.026 (ref 1.005–1.030)
pH: 5 (ref 5.0–8.0)

## 2019-04-08 MED ORDER — ONDANSETRON HCL 4 MG/2ML IJ SOLN
4.0000 mg | Freq: Once | INTRAMUSCULAR | Status: AC
  Administered 2019-04-08: 4 mg via INTRAVENOUS
  Filled 2019-04-08: qty 2

## 2019-04-08 MED ORDER — ACETAMINOPHEN 500 MG PO TABS
500.0000 mg | ORAL_TABLET | Freq: Once | ORAL | Status: AC
  Administered 2019-04-08: 500 mg via ORAL
  Filled 2019-04-08: qty 1

## 2019-04-08 MED ORDER — SODIUM CHLORIDE 0.9 % IV BOLUS
500.0000 mL | Freq: Once | INTRAVENOUS | Status: AC
  Administered 2019-04-08: 500 mL via INTRAVENOUS

## 2019-04-08 MED ORDER — POTASSIUM CHLORIDE CRYS ER 20 MEQ PO TBCR
20.0000 meq | EXTENDED_RELEASE_TABLET | Freq: Once | ORAL | Status: AC
  Administered 2019-04-08: 20 meq via ORAL
  Filled 2019-04-08: qty 1

## 2019-04-08 MED ORDER — DIPHENHYDRAMINE HCL 25 MG PO CAPS
25.0000 mg | ORAL_CAPSULE | Freq: Once | ORAL | Status: AC
  Administered 2019-04-08: 25 mg via ORAL
  Filled 2019-04-08: qty 1

## 2019-04-08 NOTE — ED Notes (Signed)
ED Provider at bedside. 

## 2019-04-08 NOTE — ED Triage Notes (Signed)
Lower abd pain, pressure when she urinates.  Pain in lower back.  States she thinks she has a kidney infection.

## 2019-04-08 NOTE — ED Provider Notes (Signed)
[facility] EMERGENCY DEPARTMENT Provider Note   CSN: [id number] Arrival date & time: 04/08/19  1831    History   Chief Complaint Chief Complaint  Patient presents with  . Abdominal Pain    HPI [name] is a 73 y.o. female.     HPI  73 year old female presents with lower abdominal discomfort and feeling like she has a UTI.  Has been ongoing for about 2 days.  There is no dysuria but she feels like she is urinating a little less and has a lot of suprapubic abdominal pressure.  Also some bilateral back pain.  No vomiting or fever though she has had some sweats.  She has chronic abdominal pain from pancreatitis but this is lower.  She also endorses right facial infection and swelling.  Recently saw her doctor and was put on prednisone and Augmentin.  No urinary urgency.  Past Medical History:  Diagnosis Date  . Allergic rhinitis   . Anxiety   . Anxiety and depression   . Back pain, chronic   . CAD (coronary artery disease)    palpitations, dizziness, chest pain  . Cervical cancer (HCC)    cervical  . Chronic abdominal pain    with chronic nausea, diarrhea  . COPD (chronic obstructive pulmonary disease) ([place])   . Depression   . GERD (gastroesophageal reflux disease)   . Helicobacter pylori infection 2015   treated with prevpac  . Hematochezia   . History of kidney stones   . Hx of colonic polyps    adenomatous  . Hyperlipidemia    Lipid profile on 02/25/2011: 209, 113, 55, 132  . Nephrolithiasis   . Pancreatitis chronic   . Renal calculus   . Stroke [facility]) 3-4 yrs ago   left sided weakness  . Tobacco abuse   . Tubular adenoma 2015  . Weight loss    CT negative for occult malignancy, negative celiac, negative adrenal insufficiency    Patient Active Problem List   Diagnosis Date Noted  . Diarrhea 06/09/2017  . Colitis 07/01/2016  . RUQ pain 10/19/2012  . Nausea with vomiting 05/07/2012  . Constipation 05/07/2012  . Laboratory test 02/17/2012  .  Hyperlipidemia   . Chest pain 01/07/2011  . ANOREXIA 05/27/2010  . WEIGHT LOSS 05/27/2010  . COLONIC POLYPS, ADENOMATOUS, HX OF 12/20/2008  . ANXIETY DEPRESSION 12/19/2008  . Tobacco abuse 12/19/2008  . ALLERGIC RHINITIS, SEASONAL 12/19/2008  . COPD 12/19/2008  . GERD 12/19/2008  . Chronic pancreatitis ([place]) 12/19/2008  . BACK PAIN, CHRONIC 12/19/2008  . Abdominal pain 12/19/2008  . CERVICAL CANCER, HX OF 12/19/2008  . Nephrolithiasis 12/19/2008    Past Surgical History:  Procedure Laterality Date  . ABDOMINAL HYSTERECTOMY    . APPENDECTOMY    . CATARACT EXTRACTION W/PHACO Right 05/04/2013   Procedure: CATARACT EXTRACTION PHACO AND INTRAOCULAR LENS PLACEMENT (IOC);  Surgeon: Tonny Branch, MD;  Location: [facility] ORS;  Service: Ophthalmology;  Laterality: Right;  CDE:16.17  . CATARACT EXTRACTION W/PHACO Left 05/22/2013   Procedure: CATARACT EXTRACTION PHACO AND INTRAOCULAR LENS PLACEMENT (IOC);  Surgeon: Tonny Branch, MD;  Location: [facility] ORS;  Service: Ophthalmology;  Laterality: Left;  CDE:14.75  . COLONOSCOPY N/A 02/08/2014   Dr.Rourk- normal rectum, colonic polyps bx=tubular adenoma  . COLONOSCOPY W/ POLYPECTOMY  2006   UXN:[id number], benign gastric nodule, multiple adenomatous polyps, one with tubular morphology  . COLONOSCOPY WITH PROPOFOL N/A 04/15/2017   Procedure: COLONOSCOPY WITH PROPOFOL;  Surgeon: Daneil Dolin, MD;  Location: [facility] ENDO  SUITE;  Service: Endoscopy;  Laterality: N/A;  11:15am  . ESOPHAGOGASTRODUODENOSCOPY  2006   KGU:RKYHCW gastric nodule  . ESOPHAGOGASTRODUODENOSCOPY N/A 02/08/2014   Dr.Rourk- abnormal stomach and gstric nodule bx= hpylori  . EUS  2010   Dr. Ardis Hughs : Multiple shadowing calcifications in pancreas, consistent with Chronic Pancreatitis.  These are mainly confined to a collection of calcifications in head/uncinate pancreas. Otherwise the pancreatic parenchyma appears fairly normal.  The main pancreatic duct, CBD are both normal without stones, dilation.     . Ileocolonoscopy  01/11/2009   RMR: Polyp at the splenic flexure, status post hot snare removal/ Normal rectum, tubular adenoma  . PARTIAL HYSTERECTOMY       OB History    Gravida  6   Para  3   Term  3   Preterm      AB  3   Living        SAB  3   TAB      Ectopic      Multiple      Live Births               Home Medications    Prior to Admission medications   Medication Sig Start Date End Date Taking? Authorizing Provider  albuterol (PROVENTIL HFA;VENTOLIN HFA) 108 (90 Base) MCG/ACT inhaler Inhale 2 puffs into the lungs every 4 (four) hours as needed for wheezing or shortness of breath. 02/12/18  Yes Francine Graven, DO  Calcium Carbonate (CALCIUM 600 PO) Take 1 tablet by mouth daily.     Yes [provider]  cilostazol (PLETAL) 100 MG tablet Take 100 mg by mouth 2 (two) times daily.  03/29/15  Yes [provider]  CREON 24000-76000 units CPEP TAKE 2 CAPSULES WITH EACH MEALS AND 1 CAPSULE WITH SNACKS. MAX 2 SNACKS PER DAY. 03/28/19  Yes Annitta Needs, NP  cyclobenzaprine (FLEXERIL) 5 MG tablet Take 5 mg by mouth at bedtime. 05/31/18  Yes [provider]  docusate sodium (COLACE) 100 MG capsule Take 100 mg by mouth 2 (two) times daily.   Yes [provider]  DULoxetine (CYMBALTA) 60 MG capsule Take 60 mg by mouth 2 (two) times daily.    Yes [provider]  escitalopram (LEXAPRO) 10 MG tablet Take 10 mg by mouth daily. 01/30/17  Yes [provider]  Fluticasone-Salmeterol (WIXELA INHUB) 250-50 MCG/DOSE AEPB Inhale 1 puff into the lungs 2 (two) times daily.   Yes [provider]  hydrOXYzine (ATARAX/VISTARIL) 25 MG tablet Take 1 tablet by mouth as needed for anxiety or itching.  04/21/16  Yes [provider]  LINZESS 72 MCG capsule TAKE ONE CAPSULE BY MOUTH DAILY BEFORE BREAKFAST. Patient taking differently: Take 72 mcg by mouth daily before breakfast.  05/27/18  Yes Mahala Menghini, PA-C  LORazepam  (ATIVAN) 1 MG tablet Take 1 mg by mouth every 8 (eight) hours as needed for anxiety. Reported on 11/19/2015   Yes [provider]  Misc Natural Products (SINUS FORMULA) TABS Take 1-2 tablets by mouth daily as needed (for cough and congestion).   Yes [provider]  omeprazole (PRILOSEC) 20 MG capsule Take 1 capsule (20 mg total) by mouth 2 (two) times daily before a meal. 02/22/19  Yes Mahala Menghini, PA-C  oxyCODONE-acetaminophen (PERCOCET) 7.5-325 MG tablet Take 1 tablet by mouth 4 (four) times daily as needed for moderate pain or severe pain.  06/02/18  Yes [provider]  potassium chloride SA (K-DUR,KLOR-CON) 20  MEQ tablet Take 20 mEq by mouth daily.   Yes [provider]  predniSONE (DELTASONE) 20 MG tablet Take 1 tablet (20 mg total) by mouth daily. 07/10/18  Yes Virgel Manifold, MD  promethazine (PHENERGAN) 25 MG tablet Take 25 mg by mouth every 6 (six) hours as needed for nausea or vomiting.    Yes [provider]  RESTASIS 0.05 % ophthalmic emulsion Place 1 drop into both eyes 2 (two) times daily. 03/27/16  Yes [provider]  Vitamin D, Ergocalciferol, (DRISDOL) 50000 units CAPS capsule Take 50,000 Units by mouth every Tuesday.    Yes [provider]  guaiFENesin (ROBITUSSIN) 100 MG/5ML SOLN Take 5 mLs by mouth every 4 (four) hours as needed for cough or to loosen phlegm.    [provider]  guaiFENesin-codeine 100-10 MG/5ML syrup Take 5-10 mLs by mouth 3 (three) times daily as needed for cough. 07/10/18   Virgel Manifold, MD  nitroGLYCERIN (NITROSTAT) 0.4 MG SL tablet Place 1 tablet (0.4 mg total) under the tongue every 5 (five) minutes as needed. 11/21/14   Herminio Commons, MD  PROLIA 60 MG/ML SOSY injection Inject 60 mg into the skin every 6 (six) months.  05/31/18   [provider]    Family History Family History  Problem Relation Age of Onset  . Heart attack Father 68  . Colon cancer Maternal  Grandmother     Social History Social History   Tobacco Use  . Smoking status: Current Every Day Smoker    Packs/day: 0.50    Years: 50.00    Pack years: 25.00    Types: Cigarettes    Start date: 07/18/1962  . Smokeless tobacco: Never Used  . Tobacco comment: 3/4 pack daily  Substance Use Topics  . Alcohol use: No    Alcohol/week: 0.0 standard drinks    Comment: hx of ETOH about 20 years ago.   . Drug use: No     Allergies   Ibuprofen, Iohexol, Other, Strawberry (diagnostic), and Strawberry extract   Review of Systems Review of Systems  Constitutional: Negative for fever.  HENT: Positive for facial swelling.   Gastrointestinal: Positive for abdominal pain. Negative for vomiting.  Genitourinary: Negative for dysuria.  Musculoskeletal: Positive for back pain.  All other systems reviewed and are negative.    Physical Exam Updated Vital Signs BP 112/70   Pulse 72   Temp 98.3 F (36.8 C) (Oral)   Resp 16   Ht 5\' 3"  (1.6 m)   Wt 34.5 kg   SpO2 97%   BMI 13.46 kg/m   Physical Exam Vitals signs and nursing note reviewed.  Constitutional:      Appearance: She is well-developed. She is cachectic.  HENT:     Head: Normocephalic and atraumatic.     Comments: No appreciable rash, facial swelling or acute dental pathology. Mild tenderness over right maxilla.    Right Ear: External ear normal.     Left Ear: External ear normal.     Nose: Nose normal.  Eyes:     General:        Right eye: No discharge.        Left eye: No discharge.  Cardiovascular:     Rate and Rhythm: Normal rate and regular rhythm.     Heart sounds: Normal heart sounds.  Pulmonary:     Effort: Pulmonary effort is normal.     Breath sounds: Normal breath sounds.  Abdominal:     Palpations: Abdomen is  soft.     Tenderness: There is abdominal tenderness (mild) in the suprapubic area. There is right CVA tenderness (mild) and left CVA tenderness (mild).  Skin:    General: Skin is warm and dry.   Neurological:     Mental Status: She is alert.  Psychiatric:        Mood and Affect: Mood is not anxious.      ED Treatments / Results  Labs (all labs ordered are listed, but only abnormal results are displayed) Labs Reviewed  URINALYSIS, ROUTINE W REFLEX MICROSCOPIC - Abnormal; Notable for the following components:      Result Value   APPearance HAZY (*)    Hgb urine dipstick MODERATE (*)    Ketones, ur 5 (*)    All other components within normal limits  BASIC METABOLIC PANEL - Abnormal; Notable for the following components:   Potassium 3.4 (*)    Glucose, Bld 138 (*)    BUN 24 (*)    Calcium 8.5 (*)    All other components within normal limits  CBC WITH DIFFERENTIAL/PLATELET - Abnormal; Notable for the following components:   WBC 12.9 (*)    Neutro Abs 10.3 (*)    Abs Immature Granulocytes 0.29 (*)    All other components within normal limits  URINE CULTURE    EKG None  Radiology Ct Abdomen Pelvis Wo Contrast  Result Date: 04/08/2019 CLINICAL DATA:  Low back pain, dysuria EXAM: CT ABDOMEN AND PELVIS WITHOUT CONTRAST TECHNIQUE: Multidetector CT imaging of the abdomen and pelvis was performed following the standard protocol without IV contrast. COMPARISON:  01/18/2018 FINDINGS: Lower chest: Emphysematous changes at the lung bases. Hepatobiliary: Unenhanced liver is unremarkable. Gallbladder is unremarkable. No intrahepatic or extrahepatic ductal dilatation. Pancreas: Coarse parenchymal calcifications along the posterior aspect of the pancreatic head/uncinate process, suggesting sequela of prior/chronic pancreatitis. Spleen: Within normal limits. Adrenals/Urinary Tract: Adrenal glands are within normal limits. Kidneys are within normal limits. Punctate nonobstructing right upper pole renal calculus (coronal image 60). No ureteral or bladder calculi. No hydronephrosis. Bladder is within normal limits. Stomach/Bowel: Stomach is within normal limits. No evidence of bowel  obstruction. Appendix is not discretely visualized. Moderate right colonic stool burden, suggesting constipation. Vascular/Lymphatic: No evidence of abdominal aortic aneurysm. Atherosclerotic calcifications of the abdominal aorta and branch vessels. No suspicious abdominopelvic lymphadenopathy. Reproductive: Status post hysterectomy. No adnexal masses. Other: No abdominopelvic ascites. Musculoskeletal: Visualized osseous structures are within normal limits. IMPRESSION: Punctate nonobstructing right upper pole renal calculus. No ureteral or bladder calculi. No hydronephrosis. Moderate right colonic stool burden, suggesting constipation. Sequela of prior/chronic pancreatitis, unchanged. Electronically Signed   By: Julian Hy M.D.   On: 04/08/2019 22:24    Procedures Procedures (including critical care time)  Medications Ordered in ED Medications  sodium chloride 0.9 % bolus 500 mL (0 mLs Intravenous Stopped 04/08/19 2017)  acetaminophen (TYLENOL) tablet 500 mg (500 mg Oral Given 04/08/19 1928)  diphenhydrAMINE (BENADRYL) capsule 25 mg (25 mg Oral Given 04/08/19 2022)  ondansetron (ZOFRAN) injection 4 mg (4 mg Intravenous Given 04/08/19 2107)  potassium chloride SA (K-DUR) CR tablet 20 mEq (20 mEq Oral Given 04/08/19 2255)     Initial Impression / Assessment and Plan / ED Course  I have reviewed the triage vital signs and the nursing notes.  Pertinent labs & imaging results that were available during my care of the patient were reviewed by me and considered in my medical decision making (see chart for details).  CT is pretty unremarkable.  There is constipation which is a chronic issue for patient.  Reports she is on multiple different meds and does not want any medication changes.  Minimal hypokalemia will be replaced.  No UTI.  I think probably constipation is the main cause of her mild abdominal pain.  No clear facial infection on my exam, continue the outpatient antibiotics she is  on.  We discussed return precautions.  Final Clinical Impressions(s) / ED Diagnoses   Final diagnoses:  Lower abdominal pain  Constipation, unspecified constipation type    ED Discharge Orders    None       Sherwood Gambler, MD 04/08/19 2314

## 2019-04-08 NOTE — Discharge Instructions (Addendum)
If you develop worsening, continued, or recurrent abdominal pain, uncontrolled vomiting, fever, chest or back pain, or any other new/concerning symptoms then return to the ER for evaluation.  

## 2019-04-10 LAB — URINE CULTURE: Culture: NO GROWTH

## 2019-05-24 ENCOUNTER — Other Ambulatory Visit: Payer: Self-pay | Admitting: Gastroenterology

## 2019-05-30 ENCOUNTER — Ambulatory Visit: Payer: Medicare Other | Admitting: Cardiovascular Disease

## 2019-06-19 ENCOUNTER — Ambulatory Visit (INDEPENDENT_AMBULATORY_CARE_PROVIDER_SITE_OTHER): Payer: Medicare Other | Admitting: Otolaryngology

## 2019-06-19 DIAGNOSIS — J342 Deviated nasal septum: Secondary | ICD-10-CM

## 2019-06-19 DIAGNOSIS — J320 Chronic maxillary sinusitis: Secondary | ICD-10-CM | POA: Diagnosis not present

## 2019-06-19 DIAGNOSIS — R490 Dysphonia: Secondary | ICD-10-CM

## 2019-06-19 DIAGNOSIS — J310 Chronic rhinitis: Secondary | ICD-10-CM | POA: Diagnosis not present

## 2019-06-20 ENCOUNTER — Other Ambulatory Visit (HOSPITAL_COMMUNITY): Payer: Self-pay | Admitting: Otolaryngology

## 2019-06-20 ENCOUNTER — Other Ambulatory Visit: Payer: Self-pay | Admitting: Otolaryngology

## 2019-06-20 DIAGNOSIS — J329 Chronic sinusitis, unspecified: Secondary | ICD-10-CM

## 2019-06-27 ENCOUNTER — Ambulatory Visit (HOSPITAL_COMMUNITY)
Admission: RE | Admit: 2019-06-27 | Discharge: 2019-06-27 | Disposition: A | Discharge status: Home / Self Care | Payer: Medicare Other | Source: Ambulatory Visit | Attending: Otolaryngology | Admitting: Otolaryngology

## 2019-06-27 ENCOUNTER — Other Ambulatory Visit: Payer: Self-pay

## 2019-06-27 DIAGNOSIS — J329 Chronic sinusitis, unspecified: Secondary | ICD-10-CM | POA: Diagnosis not present

## 2019-07-14 ENCOUNTER — Ambulatory Visit: Payer: Medicare Other | Admitting: Cardiovascular Disease

## 2019-07-17 ENCOUNTER — Ambulatory Visit (INDEPENDENT_AMBULATORY_CARE_PROVIDER_SITE_OTHER): Payer: Medicare Other | Admitting: Otolaryngology

## 2019-07-17 ENCOUNTER — Other Ambulatory Visit: Payer: Self-pay

## 2019-07-17 DIAGNOSIS — R519 Headache, unspecified: Secondary | ICD-10-CM

## 2019-07-17 DIAGNOSIS — J342 Deviated nasal septum: Secondary | ICD-10-CM | POA: Diagnosis not present

## 2019-07-17 DIAGNOSIS — R490 Dysphonia: Secondary | ICD-10-CM

## 2019-07-17 DIAGNOSIS — J0101 Acute recurrent maxillary sinusitis: Secondary | ICD-10-CM

## 2019-08-23 ENCOUNTER — Ambulatory Visit: Payer: Medicare Other | Admitting: Cardiovascular Disease

## 2019-10-24 ENCOUNTER — Ambulatory Visit: Payer: Medicare Other | Admitting: Cardiovascular Disease

## 2019-11-29 ENCOUNTER — Encounter (HOSPITAL_COMMUNITY): Payer: Self-pay | Admitting: *Deleted

## 2019-11-29 ENCOUNTER — Other Ambulatory Visit: Payer: Self-pay

## 2019-11-29 ENCOUNTER — Emergency Department (HOSPITAL_COMMUNITY)
Admission: EM | Admit: 2019-11-29 | Discharge: 2019-11-29 | Disposition: A | Discharge status: Home / Self Care | Payer: Medicare Other | Attending: Emergency Medicine | Admitting: Emergency Medicine

## 2019-11-29 DIAGNOSIS — F17210 Nicotine dependence, cigarettes, uncomplicated: Secondary | ICD-10-CM | POA: Diagnosis not present

## 2019-11-29 DIAGNOSIS — J449 Chronic obstructive pulmonary disease, unspecified: Secondary | ICD-10-CM | POA: Insufficient documentation

## 2019-11-29 DIAGNOSIS — Z79899 Other long term (current) drug therapy: Secondary | ICD-10-CM | POA: Diagnosis not present

## 2019-11-29 DIAGNOSIS — J029 Acute pharyngitis, unspecified: Secondary | ICD-10-CM | POA: Insufficient documentation

## 2019-11-29 DIAGNOSIS — I2510 Atherosclerotic heart disease of native coronary artery without angina pectoris: Secondary | ICD-10-CM | POA: Insufficient documentation

## 2019-11-29 LAB — GROUP A STREP BY PCR: Group A Strep by PCR: NOT DETECTED

## 2019-11-29 MED ORDER — DEXAMETHASONE SODIUM PHOSPHATE 10 MG/ML IJ SOLN
10.0000 mg | Freq: Once | INTRAMUSCULAR | Status: AC
  Administered 2019-11-29: 10 mg via INTRAMUSCULAR
  Filled 2019-11-29: qty 1

## 2019-11-29 NOTE — ED Triage Notes (Signed)
Pt c/o sore throat that started two weeks, was placed on antibiotics but the sore throat has not gotten any better.

## 2019-11-29 NOTE — ED Provider Notes (Signed)
[facility] EMERGENCY DEPARTMENT Provider Note   CSN: [id number] Arrival date & time: 11/29/19  1826     History Chief Complaint  Patient presents with  . Sore Throat    [name] is a 74 y.o. female.  Patient presents with sore throat that started about two weeks ago. She was seen by her PCP and started on amoxicillin. Patient states she has not had any improvement in her symptoms.   The history is provided by the patient. No language interpreter was used.  Sore Throat This is a new problem. The current episode started more than 1 week ago. The problem occurs constantly. The problem has not changed since onset.Pertinent negatives include no chest pain, no abdominal pain and no shortness of breath.       Past Medical History:  Diagnosis Date  . Allergic rhinitis   . Anxiety   . Anxiety and depression   . Back pain, chronic   . CAD (coronary artery disease)    palpitations, dizziness, chest pain  . Cervical cancer (HCC)    cervical  . Chronic abdominal pain    with chronic nausea, diarrhea  . COPD (chronic obstructive pulmonary disease) ([place])   . Depression   . GERD (gastroesophageal reflux disease)   . Helicobacter pylori infection 2015   treated with prevpac  . Hematochezia   . History of kidney stones   . Hx of colonic polyps    adenomatous  . Hyperlipidemia    Lipid profile on 02/25/2011: 209, 113, 55, 132  . Nephrolithiasis   . Pancreatitis chronic   . Renal calculus   . Stroke [facility]) 3-4 yrs ago   left sided weakness  . Tobacco abuse   . Tubular adenoma 2015  . Weight loss    CT negative for occult malignancy, negative celiac, negative adrenal insufficiency    Patient Active Problem List   Diagnosis Date Noted  . Diarrhea 06/09/2017  . Colitis 07/01/2016  . RUQ pain 10/19/2012  . Nausea with vomiting 05/07/2012  . Constipation 05/07/2012  . Laboratory test 02/17/2012  . Hyperlipidemia   . Chest pain 01/07/2011  . ANOREXIA 05/27/2010  .  WEIGHT LOSS 05/27/2010  . COLONIC POLYPS, ADENOMATOUS, HX OF 12/20/2008  . ANXIETY DEPRESSION 12/19/2008  . Tobacco abuse 12/19/2008  . ALLERGIC RHINITIS, SEASONAL 12/19/2008  . COPD 12/19/2008  . GERD 12/19/2008  . Chronic pancreatitis ([place]) 12/19/2008  . BACK PAIN, CHRONIC 12/19/2008  . Abdominal pain 12/19/2008  . CERVICAL CANCER, HX OF 12/19/2008  . Nephrolithiasis 12/19/2008    Past Surgical History:  Procedure Laterality Date  . ABDOMINAL HYSTERECTOMY    . APPENDECTOMY    . CATARACT EXTRACTION W/PHACO Right 05/04/2013   Procedure: CATARACT EXTRACTION PHACO AND INTRAOCULAR LENS PLACEMENT (IOC);  Surgeon: Tonny Branch, MD;  Location: [facility] ORS;  Service: Ophthalmology;  Laterality: Right;  CDE:16.17  . CATARACT EXTRACTION W/PHACO Left 05/22/2013   Procedure: CATARACT EXTRACTION PHACO AND INTRAOCULAR LENS PLACEMENT (IOC);  Surgeon: Tonny Branch, MD;  Location: [facility] ORS;  Service: Ophthalmology;  Laterality: Left;  CDE:14.75  . COLONOSCOPY N/A 02/08/2014   Dr.Rourk- normal rectum, colonic polyps bx=tubular adenoma  . COLONOSCOPY W/ POLYPECTOMY  2006   [id number], benign gastric nodule, multiple adenomatous polyps, one with tubular morphology  . COLONOSCOPY WITH PROPOFOL N/A 04/15/2017   Procedure: COLONOSCOPY WITH PROPOFOL;  Surgeon: Daneil Dolin, MD;  Location: [facility] ENDO SUITE;  Service: Endoscopy;  Laterality: N/A;  11:15am  . ESOPHAGOGASTRODUODENOSCOPY  2006  [id number] gastric nodule  . ESOPHAGOGASTRODUODENOSCOPY N/A 02/08/2014   Dr.Rourk- abnormal stomach and gstric nodule bx= hpylori  . EUS  2010   Dr. Ardis Hughs : Multiple shadowing calcifications in pancreas, consistent with Chronic Pancreatitis.  These are mainly confined to a collection of calcifications in head/uncinate pancreas. Otherwise the pancreatic parenchyma appears fairly normal.  The main pancreatic duct, CBD are both normal without stones, dilation.    . Ileocolonoscopy  01/11/2009   RMR: Polyp at the splenic flexure,  status post hot snare removal/ Normal rectum, tubular adenoma  . PARTIAL HYSTERECTOMY       OB History    Gravida  6   Para  3   Term  3   Preterm      AB  3   Living        SAB  3   TAB      Ectopic      Multiple      Live Births              Family History  Problem Relation Age of Onset  . Heart attack Father 82  . Colon cancer Maternal Grandmother     Social History   Tobacco Use  . Smoking status: Current Every Day Smoker    Packs/day: 0.50    Years: 50.00    Pack years: 25.00    Types: Cigarettes    Start date: 07/18/1962  . Smokeless tobacco: Never Used  . Tobacco comment: 3/4 pack daily  Substance Use Topics  . Alcohol use: No    Alcohol/week: 0.0 standard drinks    Comment: hx of ETOH about 20 years ago.   . Drug use: No    Home Medications Prior to Admission medications   Medication Sig Start Date End Date Taking? Authorizing Provider  albuterol (PROVENTIL HFA;VENTOLIN HFA) 108 (90 Base) MCG/ACT inhaler Inhale 2 puffs into the lungs every 4 (four) hours as needed for wheezing or shortness of breath. 02/12/18   Francine Graven, DO  Calcium Carbonate (CALCIUM 600 PO) Take 1 tablet by mouth daily.      [provider]  cilostazol (PLETAL) 100 MG tablet Take 100 mg by mouth 2 (two) times daily.  03/29/15   [provider]  CREON 24000-76000 units CPEP TAKE 2 CAPSULES WITH EACH MEALS AND 1 CAPSULE WITH SNACKS. MAX 2 SNACKS PER DAY. 03/28/19   Annitta Needs, NP  cyclobenzaprine (FLEXERIL) 5 MG tablet Take 5 mg by mouth at bedtime. 05/31/18   [provider]  docusate sodium (COLACE) 100 MG capsule Take 100 mg by mouth 2 (two) times daily.    [provider]  DULoxetine (CYMBALTA) 60 MG capsule Take 60 mg by mouth 2 (two) times daily.     [provider]  escitalopram (LEXAPRO) 10 MG tablet Take 10 mg by mouth daily. 01/30/17   [provider]  Fluticasone-Salmeterol (WIXELA INHUB) 250-50 MCG/DOSE  AEPB Inhale 1 puff into the lungs 2 (two) times daily.    [provider]  guaiFENesin (ROBITUSSIN) 100 MG/5ML SOLN Take 5 mLs by mouth every 4 (four) hours as needed for cough or to loosen phlegm.    [provider]  guaiFENesin-codeine 100-10 MG/5ML syrup Take 5-10 mLs by mouth 3 (three) times daily as needed for cough. 07/10/18   Virgel Manifold, MD  hydrOXYzine (ATARAX/VISTARIL) 25 MG tablet Take 1 tablet by mouth as needed for anxiety or itching.  04/21/16   [provider]  Rolan Lipa  72 MCG capsule TAKE ONE CAPSULE BY MOUTH DAILY BEFORE BREAKFAST. 05/25/19   Carlis Stable, NP  LORazepam (ATIVAN) 1 MG tablet Take 1 mg by mouth every 8 (eight) hours as needed for anxiety. Reported on 11/19/2015    [provider]  Misc Natural Products (SINUS FORMULA) TABS Take 1-2 tablets by mouth daily as needed (for cough and congestion).    [provider]  nitroGLYCERIN (NITROSTAT) 0.4 MG SL tablet Place 1 tablet (0.4 mg total) under the tongue every 5 (five) minutes as needed. 11/21/14   Herminio Commons, MD  omeprazole (PRILOSEC) 20 MG capsule Take 1 capsule (20 mg total) by mouth 2 (two) times daily before a meal. 02/22/19   Mahala Menghini, PA-C  oxyCODONE-acetaminophen (PERCOCET) 7.5-325 MG tablet Take 1 tablet by mouth 4 (four) times daily as needed for moderate pain or severe pain.  06/02/18   [provider]  potassium chloride SA (K-DUR,KLOR-CON) 20 MEQ tablet Take 20 mEq by mouth daily.    [provider]  predniSONE (DELTASONE) 20 MG tablet Take 1 tablet (20 mg total) by mouth daily. 07/10/18   Virgel Manifold, MD  PROLIA 60 MG/ML SOSY injection Inject 60 mg into the skin every 6 (six) months.  05/31/18   [provider]  promethazine (PHENERGAN) 25 MG tablet Take 25 mg by mouth every 6 (six) hours as needed for nausea or vomiting.     [provider]  RESTASIS 0.05 % ophthalmic emulsion Place 1 drop into both eyes 2 (two) times  daily. 03/27/16   [provider]  Vitamin D, Ergocalciferol, (DRISDOL) 50000 units CAPS capsule Take 50,000 Units by mouth every Tuesday.     [provider]    Allergies    Ibuprofen, Iohexol, Other, Strawberry (diagnostic), and Strawberry extract  Review of Systems   Review of Systems  HENT: Positive for congestion and sore throat. Negative for trouble swallowing.   Respiratory: Negative for shortness of breath.   Cardiovascular: Negative for chest pain.  Gastrointestinal: Negative for abdominal pain.  All other systems reviewed and are negative.   Physical Exam Updated Vital Signs BP (!) 163/96   Pulse 90   Temp 98.2 F (36.8 C) (Oral)   Resp 16   Ht 5\' 4"  (1.626 m)   Wt 34.5 kg   SpO2 95%   BMI 13.05 kg/m   Physical Exam Vitals and nursing note reviewed.  Constitutional:      Appearance: She is well-developed.  HENT:     Head: Normocephalic.     Nose: Congestion present.     Mouth/Throat:     Mouth: Mucous membranes are moist.     Pharynx: Posterior oropharyngeal erythema present.     Tonsils: No tonsillar exudate.  Eyes:     Conjunctiva/sclera: Conjunctivae normal.  Cardiovascular:     Rate and Rhythm: Normal rate and regular rhythm.  Pulmonary:     Effort: Pulmonary effort is normal.     Breath sounds: Normal breath sounds.  Abdominal:     Palpations: Abdomen is soft.  Musculoskeletal:     Cervical back: Neck supple.  Lymphadenopathy:     Cervical: Cervical adenopathy present.  Skin:    General: Skin is warm and dry.  Neurological:     Mental Status: She is alert and oriented to person, place, and time.  Psychiatric:        Mood and Affect: Mood normal.        Behavior: Behavior normal.  ED Results / Procedures / Treatments   Labs (all labs ordered are listed, but only abnormal results are displayed) Labs Reviewed  GROUP A STREP BY PCR    EKG None  Radiology No results found.  Procedures Procedures (including  critical care time)  Medications Ordered in ED Medications  dexamethasone (DECADRON) injection 10 mg (10 mg Intramuscular Given 11/29/19 2127)    ED Course  I have reviewed the triage vital signs and the nursing notes.  Pertinent labs & imaging results that were available during my care of the patient were reviewed by me and considered in my medical decision making (see chart for details).    MDM Rules/Calculators/A&P                      Pt with negative strep. Diagnosis of pharyngitis. No abx indicated at this time. Marland Kitchen Discharge with symptomatic tx. No evidence of dehydration. Pt is tolerating secretions. Presentation not concerning for peritonsillar abscess or spread of infection to deep spaces of the throat; patent airway. Specific return precautions discussed. Recommended PCP follow up. Pt appears safe for discharge. Final Clinical Impression(s) / ED Diagnoses Final diagnoses:  Sore throat    Rx / DC Orders ED Discharge Orders    None       Etta Quill, NP 11/29/19 2132    Milton Ferguson, MD 12/01/19 1157

## 2019-12-26 ENCOUNTER — Other Ambulatory Visit: Payer: Self-pay | Admitting: Nurse Practitioner

## 2019-12-26 ENCOUNTER — Other Ambulatory Visit: Payer: Self-pay | Admitting: Gastroenterology

## 2019-12-26 DIAGNOSIS — K219 Gastro-esophageal reflux disease without esophagitis: Secondary | ICD-10-CM

## 2019-12-26 DIAGNOSIS — R112 Nausea with vomiting, unspecified: Secondary | ICD-10-CM

## 2019-12-26 DIAGNOSIS — R634 Abnormal weight loss: Secondary | ICD-10-CM

## 2019-12-26 DIAGNOSIS — R1011 Right upper quadrant pain: Secondary | ICD-10-CM

## 2019-12-26 DIAGNOSIS — K861 Other chronic pancreatitis: Secondary | ICD-10-CM

## 2020-01-02 ENCOUNTER — Other Ambulatory Visit: Payer: Self-pay | Admitting: Gastroenterology

## 2020-01-03 NOTE — Telephone Encounter (Signed)
Erline Levine, please arrange routine follow-up for patient.

## 2020-01-09 ENCOUNTER — Encounter: Payer: Self-pay | Admitting: Internal Medicine

## 2020-01-09 NOTE — Telephone Encounter (Signed)
Patient scheduled and letter sent  °

## 2020-02-08 ENCOUNTER — Ambulatory Visit (INDEPENDENT_AMBULATORY_CARE_PROVIDER_SITE_OTHER): Payer: Medicare Other | Admitting: Gastroenterology

## 2020-02-08 ENCOUNTER — Encounter: Payer: Self-pay | Admitting: Gastroenterology

## 2020-02-08 ENCOUNTER — Other Ambulatory Visit: Payer: Self-pay

## 2020-02-08 ENCOUNTER — Encounter: Payer: Self-pay | Admitting: Internal Medicine

## 2020-02-08 DIAGNOSIS — K219 Gastro-esophageal reflux disease without esophagitis: Secondary | ICD-10-CM

## 2020-02-08 DIAGNOSIS — K861 Other chronic pancreatitis: Secondary | ICD-10-CM | POA: Diagnosis not present

## 2020-02-08 NOTE — Patient Instructions (Signed)
Let's try Miralax every other day to every day.   Continue Creon and Prilosec as you are doing.  We will see you in 3 months!  Annitta Needs, PhD, ANP-BC [facility]

## 2020-02-08 NOTE — Progress Notes (Signed)
Primary Care Physician:  Lemmie Evens, MD  Primary GI: Dr. Gala Romney   Patient Location: Home   Provider Location: [facility] office   Reason for Visit:    Persons present on the virtual encounter, with roles:    Total time (minutes) spent on medical discussion: 20 minutes   Due to COVID-19, visit was conducted using virtual method.  Visit was requested by patient.  Virtual Visit via Telephone Note Due to COVID-19, visit is conducted virtually and was requested by patient.   I connected with [name] on 02/11/20 at  9:30 AM EDT by telephone and verified that I am speaking with the correct person using two identifiers.   I discussed the limitations, risks, security and privacy concerns of performing an evaluation and management service by telephone and the availability of in person appointments. I also discussed with the patient that there may be a patient responsible charge related to this service. The patient expressed understanding and agreed to proceed.  Chief Complaint  Patient presents with  . Nausea    poor appetite     History of Present Illness: 74 year old female with history of advanced COPD, chronic pancreatitis, failure to thrive.   Doesn't have an appetite, which is not new. Dating back to childhood. Weight is 75. Chronic. Highest she has weighed is 87. Mother was thin. Nausea is chronic. Prilosec BID. Taking 24,000 units Creon. Takes 2 with meals and 1 with snacks.   Has gotten really hot, passes out, loses balance. Then will start having multiple bowel movements.  PCP aware. BM just once a week, chronic. Will have 2-3 at a time and have these episodes. 3 episodes between 2020-2021. Has to force herself to eat. Not much of an appetite.    Colonoscopy 2018 normal.   Past Medical History:  Diagnosis Date  . Allergic rhinitis   . Anxiety   . Anxiety and depression   . Back pain, chronic   . CAD (coronary artery disease)    palpitations, dizziness, chest  pain  . Cervical cancer (HCC)    cervical  . Chronic abdominal pain    with chronic nausea, diarrhea  . COPD (chronic obstructive pulmonary disease) ([place])   . Depression   . GERD (gastroesophageal reflux disease)   . Helicobacter pylori infection 2015   treated with prevpac  . Hematochezia   . History of kidney stones   . Hx of colonic polyps    adenomatous  . Hyperlipidemia    Lipid profile on 02/25/2011: 209, 113, 55, 132  . Nephrolithiasis   . Pancreatitis chronic   . Renal calculus   . Stroke [facility]) 3-4 yrs ago   left sided weakness  . Tobacco abuse   . Tubular adenoma 2015  . Weight loss    CT negative for occult malignancy, negative celiac, negative adrenal insufficiency     Past Surgical History:  Procedure Laterality Date  . ABDOMINAL HYSTERECTOMY    . APPENDECTOMY    . CATARACT EXTRACTION W/PHACO Right 05/04/2013   Procedure: CATARACT EXTRACTION PHACO AND INTRAOCULAR LENS PLACEMENT (IOC);  Surgeon: Tonny Branch, MD;  Location: [facility] ORS;  Service: Ophthalmology;  Laterality: Right;  CDE:16.17  . CATARACT EXTRACTION W/PHACO Left 05/22/2013   Procedure: CATARACT EXTRACTION PHACO AND INTRAOCULAR LENS PLACEMENT (IOC);  Surgeon: Tonny Branch, MD;  Location: [facility] ORS;  Service: Ophthalmology;  Laterality: Left;  CDE:14.75  . COLONOSCOPY N/A 02/08/2014   Dr.Rourk- normal rectum, colonic polyps bx=tubular adenoma  . COLONOSCOPY W/  POLYPECTOMY  2006   [id number], benign gastric nodule, multiple adenomatous polyps, one with tubular morphology  . COLONOSCOPY WITH PROPOFOL N/A 04/15/2017   normal  . ESOPHAGOGASTRODUODENOSCOPY  2006   [id number] gastric nodule  . ESOPHAGOGASTRODUODENOSCOPY N/A 02/08/2014   Dr.Rourk- abnormal stomach and gstric nodule bx= hpylori  . EUS  2010   Dr. Ardis Hughs : Multiple shadowing calcifications in pancreas, consistent with Chronic Pancreatitis.  These are mainly confined to a collection of calcifications in head/uncinate pancreas. Otherwise the pancreatic  parenchyma appears fairly normal.  The main pancreatic duct, CBD are both normal without stones, dilation.    . Ileocolonoscopy  01/11/2009   RMR: Polyp at the splenic flexure, status post hot snare removal/ Normal rectum, tubular adenoma  . PARTIAL HYSTERECTOMY       Current Meds  Medication Sig  . albuterol (PROVENTIL HFA;VENTOLIN HFA) 108 (90 Base) MCG/ACT inhaler Inhale 2 puffs into the lungs every 4 (four) hours as needed for wheezing or shortness of breath.  . Calcium Carbonate (CALCIUM 600 PO) Take 1 tablet by mouth daily.    . cilostazol (PLETAL) 100 MG tablet Take 100 mg by mouth 2 (two) times daily.   Marland Kitchen CREON 24000-76000 units CPEP TAKE 2 CAPSULES WITH EACH MEALS AND 1 CAPSULE WITH SNACKS. MAX 2 SNACKS PER DAY.  . cyclobenzaprine (FLEXERIL) 5 MG tablet Take 5 mg by mouth at bedtime.  . docusate sodium (COLACE) 100 MG capsule Take 100 mg by mouth 2 (two) times daily.  . DULoxetine (CYMBALTA) 60 MG capsule Take 60 mg by mouth 2 (two) times daily.   Marland Kitchen escitalopram (LEXAPRO) 10 MG tablet Take 10 mg by mouth daily.  . Fluticasone-Salmeterol (WIXELA INHUB) 250-50 MCG/DOSE AEPB Inhale 1 puff into the lungs as needed.   . hydrOXYzine (ATARAX/VISTARIL) 25 MG tablet Take 1 tablet by mouth as needed for anxiety or itching.   Marland Kitchen LINZESS 72 MCG capsule TAKE ONE CAPSULE BY MOUTH DAILY BEFORE BREAKFAST.  Marland Kitchen LORazepam (ATIVAN) 1 MG tablet Take 1 mg by mouth every 8 (eight) hours as needed for anxiety. Reported on 11/19/2015  . Misc Natural Products (SINUS FORMULA) TABS Take 1-2 tablets by mouth daily as needed (for cough and congestion).  . nitroGLYCERIN (NITROSTAT) 0.4 MG SL tablet Place 1 tablet (0.4 mg total) under the tongue every 5 (five) minutes as needed.  Marland Kitchen omeprazole (PRILOSEC) 20 MG capsule TAKE 1 CAPSULE BY MOUTH TWICE DAILY.  Marland Kitchen oxyCODONE-acetaminophen (PERCOCET) 7.5-325 MG tablet Take 1 tablet by mouth 4 (four) times daily as needed for moderate pain or severe pain.   . potassium chloride  SA (K-DUR,KLOR-CON) 20 MEQ tablet Take 20 mEq by mouth daily.  . predniSONE (DELTASONE) 20 MG tablet Take 1 tablet (20 mg total) by mouth daily.  Marland Kitchen PROLIA 60 MG/ML SOSY injection Inject 60 mg into the skin every 6 (six) months.   . promethazine (PHENERGAN) 25 MG tablet Take 25 mg by mouth as needed for nausea or vomiting.   . RESTASIS 0.05 % ophthalmic emulsion Place 1 drop into both eyes 2 (two) times daily.  . Vitamin D, Ergocalciferol, (DRISDOL) 50000 units CAPS capsule Take 50,000 Units by mouth every Tuesday.      Family History  Problem Relation Age of Onset  . Heart attack Father 48  . Colon cancer Maternal Grandmother     Social History   Socioeconomic History  . Marital status: Divorced    Spouse name: Not on file  . Number of children: 3  .  Years of education: Not on file  . Highest education level: Not on file  Occupational History  . Occupation: disabled    Fish farm manager: UNEMPLOYED  Tobacco Use  . Smoking status: Current Every Day Smoker    Packs/day: 0.50    Years: 50.00    Pack years: 25.00    Types: Cigarettes    Start date: 07/18/1962  . Smokeless tobacco: Never Used  . Tobacco comment: 3/4 pack daily  Substance and Sexual Activity  . Alcohol use: No    Alcohol/week: 0.0 standard drinks    Comment: hx of ETOH about 20 years ago.   . Drug use: No  . Sexual activity: Never    Birth control/protection: Surgical  Other Topics Concern  . Not on file  Social History Narrative  . Not on file   Social Determinants of Health   Financial Resource Strain:   . Difficulty of Paying Living Expenses:   Food Insecurity:   . Worried About Charity fundraiser in the Last Year:   . Arboriculturist in the Last Year:   Transportation Needs:   . Film/video editor (Medical):   Marland Kitchen Lack of Transportation (Non-Medical):   Physical Activity:   . Days of Exercise per Week:   . Minutes of Exercise per Session:   Stress:   . Feeling of Stress :   Social Connections:     . Frequency of Communication with Friends and Family:   . Frequency of Social Gatherings with Friends and Family:   . Attends Religious Services:   . Active Member of Clubs or Organizations:   . Attends Archivist Meetings:   Marland Kitchen Marital Status:        Review of Systems: Gen: see HPI  CV: Denies chest pain, palpitations, syncope, peripheral edema, and claudication. Resp: Denies dyspnea at rest, cough, wheezing, coughing up blood, and pleurisy. GI: see HPI Derm: Denies rash, itching, dry skin Psych: Denies depression, anxiety, memory loss, confusion. No homicidal or suicidal ideation.  Heme: Denies bruising, bleeding, and enlarged lymph nodes.  Observations/Objective: No distress. Unable to perform physical exam due to telephone encounter. No video available.   Assessment and Plan: 74 year old female with history of chronic pancreatitis, faulire to thrive, presenting in routine follow-up via telephone. Overall, she is at baseline for chronic symptoms including nausea and abdominal pain. Continues with Creon 24,000 units, taking 2 capsules with meals and 1 with snacks. Weight overall stable.  Describing episodes of what maybe vasovagal response in setting of constipation with subsequent multiple stools, several times over the past few years. I have asked her to trial Miralax every other day, as she notes only 1 BM weekly (states is her baseline).   She is declining any further evaluation at [facility] as well. Would be beneficial to have her seen in person in next few months to obtain actual weight and ensure all is stable. Continue with Creon and Prilosec for now, with 3 month return. Add Miralax every other day to daily.     Follow Up Instructions:    I discussed the assessment and treatment plan with the patient. The patient was provided an opportunity to ask questions and all were answered. The patient agreed with the plan and demonstrated an understanding of the  instructions.   The patient was advised to call back or seek an in-person evaluation if the symptoms worsen or if the condition fails to improve as anticipated.  I provided 20 minutes of  non-face-to-face time during this encounter.  Annitta Needs, PhD, ANP-BC [facility]

## 2020-02-11 ENCOUNTER — Encounter: Payer: Self-pay | Admitting: Gastroenterology

## 2020-02-21 ENCOUNTER — Telehealth: Payer: Self-pay | Admitting: *Deleted

## 2020-02-21 NOTE — Telephone Encounter (Signed)
Pt consented to a telephone visit on 02/08/20.

## 2020-02-21 NOTE — Telephone Encounter (Signed)
[place], you are scheduled for a virtual visit with your provider today.  Just as we do with appointments in the office, we must obtain your consent to participate.  Your consent will be active for this visit and any virtual visit you may have with one of our providers in the next 365 days.  If you have a MyChart account, I can also send a copy of this consent to you electronically.  All virtual visits are billed to your insurance company just like a traditional visit in the office.  As this is a virtual visit, video technology does not allow for your provider to perform a traditional examination.  This may limit your provider's ability to fully assess your condition.  If your provider identifies any concerns that need to be evaluated in person or the need to arrange testing such as labs, EKG, etc, we will make arrangements to do so.  Although advances in technology are sophisticated, we cannot ensure that it will always work on either your end or our end.  If the connection with a video visit is poor, we may have to switch to a telephone visit.  With either a video or telephone visit, we are not always able to ensure that we have a secure connection.   I need to obtain your verbal consent now.   Are you willing to proceed with your visit today?

## 2020-03-27 ENCOUNTER — Other Ambulatory Visit: Payer: Self-pay | Admitting: Gastroenterology

## 2020-05-14 ENCOUNTER — Ambulatory Visit (INDEPENDENT_AMBULATORY_CARE_PROVIDER_SITE_OTHER): Payer: Medicare Other | Admitting: Gastroenterology

## 2020-05-14 ENCOUNTER — Encounter: Payer: Self-pay | Admitting: Gastroenterology

## 2020-05-14 ENCOUNTER — Other Ambulatory Visit: Payer: Self-pay

## 2020-05-14 VITALS — BP 110/66 | HR 95 | Temp 97.5°F | Ht 64.0 in | Wt 80.0 lb

## 2020-05-14 DIAGNOSIS — Z8619 Personal history of other infectious and parasitic diseases: Secondary | ICD-10-CM

## 2020-05-14 DIAGNOSIS — K861 Other chronic pancreatitis: Secondary | ICD-10-CM

## 2020-05-14 DIAGNOSIS — K5900 Constipation, unspecified: Secondary | ICD-10-CM | POA: Diagnosis not present

## 2020-05-14 DIAGNOSIS — R1011 Right upper quadrant pain: Secondary | ICD-10-CM

## 2020-05-14 NOTE — Progress Notes (Signed)
Referring Provider: Lemmie Evens, MD Primary Care Physician:  Lemmie Evens, MD Primary GI: Dr. Gala Romney   Chief Complaint  Patient presents with  . Pancreatitis    f/u. right side daily that goes/goes  . no appetite    HPI:   [name] is a 74 y.o. female presenting today with a history of advanced COPD, chronic pancreatitis, and failure to thrive. Weight 75 in May 2021. Today 80. Highest she has weighed is 87. Last colonoscopy 2018 normal. H.pylori in 2015: I do not see where we have documented eradication. HIDA in 2018: normal EF 49%. No gallstones on [place] several years ago. Chronic abdominal pain.   Has significant constipation. Linzess 72. Seeing Creon in her stool sometimes. BM usually once per week. Will break out into a sweat, have discomfort, then have BM. Pain in right side, wraps around back. Has pain after eating.   EUS 2019 at [facility]: unable to perform celiac plexus block.   CTA Aug 2018 negative for chronic mesenteric ischemia.   Past Medical History:  Diagnosis Date  . Allergic rhinitis   . Anxiety   . Anxiety and depression   . Back pain, chronic   . CAD (coronary artery disease)    palpitations, dizziness, chest pain  . Cervical cancer (HCC)    cervical  . Chronic abdominal pain    with chronic nausea, diarrhea  . COPD (chronic obstructive pulmonary disease) ([place])   . Depression   . GERD (gastroesophageal reflux disease)   . Helicobacter pylori infection 2015   treated with prevpac  . Hematochezia   . History of kidney stones   . Hx of colonic polyps    adenomatous  . Hyperlipidemia    Lipid profile on 02/25/2011: 209, 113, 55, 132  . Nephrolithiasis   . Pancreatitis chronic   . Renal calculus   . Stroke [facility]) 3-4 yrs ago   left sided weakness  . Tobacco abuse   . Tubular adenoma 2015  . Weight loss    CT negative for occult malignancy, negative celiac, negative adrenal insufficiency    Past Surgical History:  Procedure  Laterality Date  . ABDOMINAL HYSTERECTOMY    . APPENDECTOMY    . CATARACT EXTRACTION W/PHACO Right 05/04/2013   Procedure: CATARACT EXTRACTION PHACO AND INTRAOCULAR LENS PLACEMENT (IOC);  Surgeon: Tonny Branch, MD;  Location: [facility];  Service: Ophthalmology;  Laterality: Right;  CDE:16.17  . CATARACT EXTRACTION W/PHACO Left 05/22/2013   Procedure: CATARACT EXTRACTION PHACO AND INTRAOCULAR LENS PLACEMENT (IOC);  Surgeon: Tonny Branch, MD;  Location: [facility];  Service: Ophthalmology;  Laterality: Left;  CDE:14.75  . COLONOSCOPY N/A 02/08/2014   Dr.Rourk- normal rectum, colonic polyps bx=tubular adenoma  . COLONOSCOPY W/ POLYPECTOMY  2006   [id number], benign gastric nodule, multiple adenomatous polyps, one with tubular morphology  . COLONOSCOPY WITH PROPOFOL N/A 04/15/2017   normal  . ESOPHAGOGASTRODUODENOSCOPY  2006   [id number] gastric nodule  . ESOPHAGOGASTRODUODENOSCOPY N/A 02/08/2014   Dr.Rourk- abnormal stomach and gstric nodule bx= hpylori  . EUS  2010   Dr. Ardis Hughs : Multiple shadowing calcifications in pancreas, consistent with Chronic Pancreatitis.  These are mainly confined to a collection of calcifications in head/uncinate pancreas. Otherwise the pancreatic parenchyma appears fairly normal.  The main pancreatic duct, CBD are both normal without stones, dilation.    . Ileocolonoscopy  01/11/2009   RMR: Polyp at the splenic flexure, status post hot snare removal/ Normal rectum, tubular adenoma  . PARTIAL HYSTERECTOMY  Current Outpatient Medications  Medication Sig Dispense Refill  . albuterol (PROVENTIL HFA;VENTOLIN HFA) 108 (90 Base) MCG/ACT inhaler Inhale 2 puffs into the lungs every 4 (four) hours as needed for wheezing or shortness of breath. 1 Inhaler 0  . Calcium Carbonate (CALCIUM 600 PO) Take 1 tablet by mouth daily.      . cilostazol (PLETAL) 100 MG tablet Take 100 mg by mouth 2 (two) times daily.     Marland Kitchen CREON 24000-76000 units CPEP TAKE 2 CAPSULES WITH EACH MEALS AND 1  CAPSULE WITH SNACKS. MAX 2 SNACKS PER DAY. 240 capsule 5  . cyclobenzaprine (FLEXERIL) 5 MG tablet Take 5 mg by mouth at bedtime.    . docusate sodium (COLACE) 100 MG capsule Take 100 mg by mouth 2 (two) times daily.    . DULoxetine (CYMBALTA) 60 MG capsule Take 60 mg by mouth 2 (two) times daily.     Marland Kitchen escitalopram (LEXAPRO) 10 MG tablet Take 10 mg by mouth daily.    . Fluticasone-Salmeterol (WIXELA INHUB) 250-50 MCG/DOSE AEPB Inhale 1 puff into the lungs as needed.     . hydrOXYzine (ATARAX/VISTARIL) 25 MG tablet Take 1 tablet by mouth as needed for anxiety or itching.     Marland Kitchen LINZESS 72 MCG capsule TAKE ONE CAPSULE BY MOUTH DAILY BEFORE BREAKFAST. 90 capsule 3  . LORazepam (ATIVAN) 1 MG tablet Take 1 mg by mouth every 8 (eight) hours as needed for anxiety. Reported on 11/19/2015    . Misc Natural Products (SINUS FORMULA) TABS Take 1-2 tablets by mouth daily as needed (for cough and congestion).    . nitroGLYCERIN (NITROSTAT) 0.4 MG SL tablet Place 1 tablet (0.4 mg total) under the tongue every 5 (five) minutes as needed. 25 tablet 3  . omeprazole (PRILOSEC) 20 MG capsule TAKE 1 CAPSULE BY MOUTH TWICE DAILY. 180 capsule 1  . oxyCODONE-acetaminophen (PERCOCET) 7.5-325 MG tablet Take 1 tablet by mouth 4 (four) times daily as needed for moderate pain or severe pain.     . potassium chloride SA (K-DUR,KLOR-CON) 20 MEQ tablet Take 20 mEq by mouth daily.    Marland Kitchen PROLIA 60 MG/ML SOSY injection Inject 60 mg into the skin every 6 (six) months.     . promethazine (PHENERGAN) 25 MG tablet Take 25 mg by mouth as needed for nausea or vomiting.     . RESTASIS 0.05 % ophthalmic emulsion Place 1 drop into both eyes 2 (two) times daily.    . Vitamin D, Ergocalciferol, (DRISDOL) 50000 units CAPS capsule Take 50,000 Units by mouth every Tuesday.     . predniSONE (DELTASONE) 20 MG tablet Take 1 tablet (20 mg total) by mouth daily. (Patient not taking: Reported on 05/14/2020) 5 tablet 0   No current facility-administered  medications for this visit.    Allergies as of 05/14/2020 - Review Complete 05/14/2020  Allergen Reaction Noted  . Ibuprofen Anaphylaxis and Hives 12/19/2008  . Iohexol Hives and Swelling 05/23/2004  . Other Itching 11/09/2015  . Strawberry (diagnostic) Itching 11/09/2015  . Strawberry extract Itching 09/29/2017    Family History  Problem Relation Age of Onset  . Heart attack Father 34  . Colon cancer Maternal Grandmother     Social History   Socioeconomic History  . Marital status: Divorced    Spouse name: Not on file  . Number of children: 3  . Years of education: Not on file  . Highest education level: Not on file  Occupational History  . Occupation: disabled  Employer: UNEMPLOYED  Tobacco Use  . Smoking status: Current Every Day Smoker    Packs/day: 0.50    Years: 50.00    Pack years: 25.00    Types: Cigarettes    Start date: 07/18/1962  . Smokeless tobacco: Never Used  . Tobacco comment: 3/4 pack daily  Vaping Use  . Vaping Use: Never used  Substance and Sexual Activity  . Alcohol use: No    Alcohol/week: 0.0 standard drinks    Comment: hx of ETOH about 20 years ago.   . Drug use: No  . Sexual activity: Never    Birth control/protection: Surgical  Other Topics Concern  . Not on file  Social History Narrative  . Not on file   Social Determinants of Health   Financial Resource Strain:   . Difficulty of Paying Living Expenses:   Food Insecurity:   . Worried About Charity fundraiser in the Last Year:   . Arboriculturist in the Last Year:   Transportation [name]:   . Film/video editor (Medical):   Marland Kitchen Lack of Transportation (Non-Medical):   Physical Activity:   . Days of Exercise per Week:   . Minutes of Exercise per Session:   Stress:   . Feeling of Stress :   Social Connections:   . Frequency of Communication with Friends and Family:   . Frequency of Social Gatherings with Friends and Family:   . Attends Religious Services:   . Active  Member of Clubs or Organizations:   . Attends Archivist Meetings:   Marland Kitchen Marital Status:     Review of Systems: Gen: Denies fever, chills, anorexia. Denies fatigue, weakness, weight loss.  CV: Denies chest pain, palpitations, syncope, peripheral edema, and claudication. Resp: Denies dyspnea at rest, cough, wheezing, coughing up blood, and pleurisy. GI: see HPI Derm: Denies rash, itching, dry skin Psych: Denies depression, anxiety, memory loss, confusion. No homicidal or suicidal ideation.  Heme: Denies bruising, bleeding, and enlarged lymph nodes.  Physical Exam: BP 110/66   Pulse 95   Temp (!) 97.5 F (36.4 C)   Ht 5\' 4"  (1.626 m)   Wt 80 lb (36.3 kg)   BMI 13.73 kg/m  General:   Alert and oriented. No distress noted. Pleasant and cooperative.  Head:  Normocephalic and atraumatic. Eyes:  Conjuctiva clear without scleral icterus. Mouth: mask in place Abdomen:  +BS, soft, TTP RUQ and non-distended. No rebound or guarding. No HSM or masses noted. Msk:  Symmetrical without gross deformities. Normal posture. Extremities:  Without edema. Neurologic:  Alert and  oriented x4 Psych:  Alert and cooperative. Normal mood and affect.  ASSESSMENT: SHARISA TOVES is a 74 y.o. female presenting today with history of chronic pancreatitis, failure to thrive, chronic abdominal pain, constipation.   RUQ pain: feels like this is worsening, and prior biliary evaluation unrevealing. However, her symptoms are concerning for biliary etiology. As no recent US, we will update this now. May need updated HIDA: I do note that the last HIDA on file had low normal EF.   Constipation: contributing to overall abdominal pain as well but not sole culprit. Currently on Linzess 72 mcg. Will increase to 290 mcg daily. May need to trial Amitiza or Motegrity.   History of H.pylori in past: I do not see where eradication was documented.    PLAN:  CBC, CMP today  RUQ [place], likely will need HIDA  Start  Linzess 290 mcg samples and call with update  Urea breath test in 2 weeks: Hold PPI for 14 days prior  2 month follow-up   Candace Needs, PhD, ANP-BC [facility]

## 2020-05-14 NOTE — Patient Instructions (Signed)
Please have blood work today.  Please stop omeprazole (Prilosec) starting tomorrow. After 14 days, you can go to the lab again and have the breath test. Make sure not to eat or drink anything after midnight prior to that. Also, you will need to be off any antibiotics as well. Please call if you are prescribed anything prior to this test.   I have ordered an ultrasound of your gallbladder.  For constipation: stop Linzess 72 micrograms. We are going to do a higher dosage. Let me know how this works for you in about a week. If no improvement, we will try a different medication.  We will see you in 2 months!  I enjoyed seeing you again today! As you know, I value our relationship and want to provide genuine, compassionate, and quality care. I welcome your feedback. If you receive a survey regarding your visit,  I greatly appreciate you taking time to fill this out. See you next time!  Annitta Needs, PhD, ANP-BC [facility]

## 2020-05-15 LAB — CBC WITH DIFFERENTIAL/PLATELET
Absolute Monocytes: 824 cells/uL (ref 200–950)
Basophils Absolute: 62 cells/uL (ref 0–200)
Basophils Relative: 0.8 %
Eosinophils Absolute: 193 cells/uL (ref 15–500)
Eosinophils Relative: 2.5 %
HCT: 40.6 % (ref 35.0–45.0)
Hemoglobin: 14.1 g/dL (ref 11.7–15.5)
Lymphs Abs: 2880 cells/uL (ref 850–3900)
MCH: 32.5 pg (ref 27.0–33.0)
MCHC: 34.7 g/dL (ref 32.0–36.0)
MCV: 93.5 fL (ref 80.0–100.0)
MPV: 10.6 fL (ref 7.5–12.5)
Monocytes Relative: 10.7 %
Neutro Abs: 3742 cells/uL (ref 1500–7800)
Neutrophils Relative %: 48.6 %
Platelets: 339 10*3/uL (ref 140–400)
RBC: 4.34 10*6/uL (ref 3.80–5.10)
RDW: 12.3 % (ref 11.0–15.0)
Total Lymphocyte: 37.4 %
WBC: 7.7 10*3/uL (ref 3.8–10.8)

## 2020-05-15 LAB — COMPLETE METABOLIC PANEL WITH GFR
AG Ratio: 1.8 (calc) (ref 1.0–2.5)
ALT: 8 U/L (ref 6–29)
AST: 14 U/L (ref 10–35)
Albumin: 4.5 g/dL (ref 3.6–5.1)
Alkaline phosphatase (APISO): 50 U/L (ref 37–153)
BUN: 15 mg/dL (ref 7–25)
CO2: 31 mmol/L (ref 20–32)
Calcium: 10.1 mg/dL (ref 8.6–10.4)
Chloride: 101 mmol/L (ref 98–110)
Creat: 0.61 mg/dL (ref 0.60–0.93)
GFR, Est African American: 104 mL/min/{1.73_m2} (ref >=60)
GFR, Est Non African American: 90 mL/min/{1.73_m2} (ref >=60)
Globulin: 2.5 g/dL (calc) (ref 1.9–3.7)
Glucose, Bld: 82 mg/dL (ref 65–139)
Potassium: 4.2 mmol/L (ref 3.5–5.3)
Sodium: 140 mmol/L (ref 135–146)
Total Bilirubin: 0.3 mg/dL (ref 0.2–1.2)
Total Protein: 7 g/dL (ref 6.1–8.1)

## 2020-05-21 ENCOUNTER — Ambulatory Visit (HOSPITAL_COMMUNITY): Payer: Medicare Other

## 2020-05-28 ENCOUNTER — Ambulatory Visit (HOSPITAL_COMMUNITY)
Admission: RE | Admit: 2020-05-28 | Discharge: 2020-05-28 | Disposition: A | Discharge status: Home / Self Care | Payer: Medicare Other | Source: Ambulatory Visit | Attending: Gastroenterology | Admitting: Gastroenterology

## 2020-05-28 ENCOUNTER — Other Ambulatory Visit: Payer: Self-pay

## 2020-05-28 DIAGNOSIS — R1011 Right upper quadrant pain: Secondary | ICD-10-CM | POA: Diagnosis present

## 2020-05-29 LAB — H. PYLORI BREATH TEST: H. pylori Breath Test: NOT DETECTED

## 2020-06-04 ENCOUNTER — Other Ambulatory Visit: Payer: Self-pay | Admitting: *Deleted

## 2020-06-04 DIAGNOSIS — R1011 Right upper quadrant pain: Secondary | ICD-10-CM

## 2020-06-05 ENCOUNTER — Encounter: Payer: Self-pay | Admitting: *Deleted

## 2020-06-06 ENCOUNTER — Telehealth: Payer: Self-pay | Admitting: *Deleted

## 2020-06-06 NOTE — Telephone Encounter (Signed)
PA approved via [facility] website. Auth# [id number] dates 06/06/2020-07/21/2020

## 2020-06-18 ENCOUNTER — Encounter (HOSPITAL_COMMUNITY)
Admission: RE | Admit: 2020-06-18 | Discharge: 2020-06-18 | Disposition: A | Discharge status: Home / Self Care | Payer: Medicare Other | Source: Ambulatory Visit | Attending: Gastroenterology | Admitting: Gastroenterology

## 2020-06-18 ENCOUNTER — Other Ambulatory Visit: Payer: Self-pay

## 2020-06-18 ENCOUNTER — Encounter (HOSPITAL_COMMUNITY): Payer: Self-pay

## 2020-06-18 DIAGNOSIS — R1011 Right upper quadrant pain: Secondary | ICD-10-CM | POA: Insufficient documentation

## 2020-06-18 HISTORY — DX: Systemic involvement of connective tissue, unspecified: M35.9

## 2020-06-18 MED ORDER — TECHNETIUM TC 99M MEBROFENIN IV KIT
5.0000 | PACK | Freq: Once | INTRAVENOUS | Status: AC | PRN
  Administered 2020-06-18: 5.2 via INTRAVENOUS

## 2020-06-27 ENCOUNTER — Other Ambulatory Visit: Payer: Self-pay | Admitting: Gastroenterology

## 2020-06-27 DIAGNOSIS — R112 Nausea with vomiting, unspecified: Secondary | ICD-10-CM

## 2020-06-27 DIAGNOSIS — K219 Gastro-esophageal reflux disease without esophagitis: Secondary | ICD-10-CM

## 2020-06-27 DIAGNOSIS — K861 Other chronic pancreatitis: Secondary | ICD-10-CM

## 2020-06-27 DIAGNOSIS — R1011 Right upper quadrant pain: Secondary | ICD-10-CM

## 2020-06-27 DIAGNOSIS — R634 Abnormal weight loss: Secondary | ICD-10-CM

## 2020-06-28 ENCOUNTER — Other Ambulatory Visit: Payer: Self-pay | Admitting: Gastroenterology

## 2020-06-28 DIAGNOSIS — R1011 Right upper quadrant pain: Secondary | ICD-10-CM

## 2020-06-28 DIAGNOSIS — R634 Abnormal weight loss: Secondary | ICD-10-CM

## 2020-06-28 DIAGNOSIS — K861 Other chronic pancreatitis: Secondary | ICD-10-CM

## 2020-06-28 DIAGNOSIS — R112 Nausea with vomiting, unspecified: Secondary | ICD-10-CM

## 2020-06-28 DIAGNOSIS — K219 Gastro-esophageal reflux disease without esophagitis: Secondary | ICD-10-CM

## 2020-07-03 ENCOUNTER — Other Ambulatory Visit: Payer: Self-pay | Admitting: *Deleted

## 2020-07-03 DIAGNOSIS — R1011 Right upper quadrant pain: Secondary | ICD-10-CM

## 2020-07-16 ENCOUNTER — Ambulatory Visit (INDEPENDENT_AMBULATORY_CARE_PROVIDER_SITE_OTHER): Payer: Medicare Other | Admitting: General Surgery

## 2020-07-16 ENCOUNTER — Encounter: Payer: Self-pay | Admitting: General Surgery

## 2020-07-16 ENCOUNTER — Other Ambulatory Visit: Payer: Self-pay

## 2020-07-16 VITALS — BP 109/71 | HR 85 | Temp 98.1°F | Resp 16 | Ht 64.0 in | Wt 80.0 lb

## 2020-07-16 DIAGNOSIS — R1011 Right upper quadrant pain: Secondary | ICD-10-CM | POA: Diagnosis not present

## 2020-07-16 DIAGNOSIS — K861 Other chronic pancreatitis: Secondary | ICD-10-CM | POA: Diagnosis not present

## 2020-07-16 NOTE — Patient Instructions (Signed)
Chronic Pancreatitis    Chronic pancreatitis is long-lasting inflammation and scarring of the pancreas. The pancreas is a gland that is located behind the stomach. It makes enzymes that help to digest food. The pancreas also releases hormones called glucagon and insulin, which help regulate blood sugar (glucose). Damage to the pancreas may affect digestion, cause pain in the upper abdomen and back, and cause diabetes. Inflammation can also irritate other organs in the abdomen near the pancreas.  At first, pancreatitis may be sudden (acute). If you have several or prolonged episodes of acute pancreatitis, the condition can turn into chronic pancreatitis.  What are the causes?  The most common cause of this condition is alcohol abuse. Other causes include:  · High (elevated) levels of triglycerides in the blood (hypertriglyceridemia).  · Gallstones or other conditions that can block the tube that drains the pancreas (pancreatic duct).  · Pancreatic cancer.  · Cystic fibrosis.  · Too much calcium in the blood (hypercalcemia), which may be caused by an overactive parathyroid gland (hyperparathyroidism).  · Certain medicines.  · Injury to the pancreas.  · Infection.  · Autoimmune pancreatitis. This is when the body's disease-fighting (immune) system attacks the pancreas.  · Genes that are passed from parent to child (inherited).  In some cases, the cause may not be known.  What increases the risk?  This condition is more likely to develop in:  · Men.  · People who are 35-55 years old.  · People who have a family history of pancreatitis.  · People who smoke tobacco.  · People who drink large amounts of alcohol over a long period of time.  What are the signs or symptoms?  Symptoms of this condition may include:  · Pain in the abdomen or upper back. Pain may get worse after eating.  · Nausea and vomiting.  · Fever.  · Weight loss.  · A change in the color and consistency of bowel movements, such as stools that are oily,  fatty, or clay-colored.  How is this diagnosed?  This condition is diagnosed based on your symptoms, your medical history, and a physical exam. You may have tests, such as:  · Blood tests.  · Stool samples.  · Biopsy of the pancreas. This is the removal of a small amount of pancreas tissue to be tested in a lab.  · Imaging tests, such as:  ? X-rays.  ? CT scan.  ? MRI.  ? Ultrasound.  How is this treated?  You may need to be treated at a hospital. Treatment may involve:  · Resting the pancreas. You may need to stop eating and drinking for a few days to give your pancreas time to recover. During this time, you will be given IV fluids to keep you hydrated.  · Controlling pain. You may be given pain medicines by mouth (orally) or as injections.  · Improving digestion. You may be given:  ? Medicines to replace your pancreatic enzymes.  ? Vitamin supplements.  ? A specific diet to follow. You may work with a diet and nutrition specialist (dietitian) to make an eating plan.  · Surgery to:  ? Clear the pancreatic ducts of any blockages, such as gallstones.  ? Remove any fluid or damaged tissue from the pancreas.  Other treatments may include:  · Preventing diabetes. Your health care provider may recommend that you:  ? Get regular screening tests for diabetes.  ? Monitor your blood glucose regularly.  · Lifestyle changes, such   by your health care provider or dietitian, if this applies. This may include: ? Limiting how much fat you eat. ? Eating smaller meals more often. ? Avoiding caffeine.  Drink enough fluid to keep your urine pale yellow. General instructions  Take over-the-counter and  prescription medicines only as told by your health care provider. These include vitamin supplements.  Do not drive or use heavy machinery while taking prescription pain medicine.  If you are taking prescription pain medicine, take actions to prevent or treat constipation. Your health care provider may recommend that you: ? Take an over-the-counter or prescription medicine for constipation. ? Eat foods that are high in fiber such as whole grains and beans. ? Limit foods that are high in fat and processed sugars, such as fried or sweet foods.  Do not use any products that contain nicotine or tobacco, such as cigarettes and e-cigarettes. If you need help quitting, ask your health care provider.  If recommended by your health care provider, monitor your blood glucose at home.  Keep all follow-up visits as told by your health care provider. This is important. Contact a health care provider if:  You have pain that does not get better with medicine.  You have a fever.  You have sudden weight loss. Get help right away if:  Your pain suddenly gets worse.  You have sudden swelling in your abdomen.  You start to vomit often.  You vomit blood.  You have diarrhea that does not go away.  You have blood in your stool.  You become confused or you have trouble thinking clearly. Summary  Chronic pancreatitis is long-lasting inflammation and scarring of the pancreas. Damage to the pancreas may affect digestion, cause pain in the upper abdomen and back, and cause diabetes. Inflammation can also irritate other organs in the abdomen near the pancreas.  Common causes of this condition are alcohol abuse, gallstones, high (elevated) levels of triglycerides, and certain medicines.  This condition is sometimes treated at a hospital and may involve resting the pancreas, controlling pain, replacing enzymes, and avoiding alcohol. This information is not intended to replace advice given to you by your  health care provider. Make sure you discuss any questions you have with your health care provider. Document Revised: 04/20/2019 Document Reviewed: 05/14/2017 Elsevier Patient Education  [place].

## 2020-07-16 NOTE — Progress Notes (Signed)
[facility] History and Physical   Reason for Referral: RUQ pain Referring Physician:  Roseanne Kaufman, NP   Chief Complaint    New Patient (Initial Visit)      [name] is a 74 y.o. female.  HPI: [name] is a sweet 74 yo who has chronic abdominal pain and chronic pancreatitis with calcifications of the pancreatic head who was unable to undergo celiac plexus block at [facility] due to inadequate fat pad for injection. She says that her pain is mostly in the right upper quadrant and radiates around to her back. She also has chronic back pain. She says that the pain can be sharp in nature and is pretty constant at times. She has also lost weight and is below 80 lbs she reports. She has been followed and worked up with GI and recent repeat H pylori was negative. She had prior EGD with last documented in 2019. She has had repeat HIDA and [place] without findings. Her last CT in our system was from 2020. She has a remote history of drinking but nothing recent.   Past Medical History:  Diagnosis Date  . Allergic rhinitis   . Anxiety   . Anxiety and depression   . Back pain, chronic   . CAD (coronary artery disease)    palpitations, dizziness, chest pain  . Cervical cancer (HCC)    cervical  . Chronic abdominal pain    with chronic nausea, diarrhea  . Collagen vascular disease ([place])   . COPD (chronic obstructive pulmonary disease) ([place])   . Depression   . GERD (gastroesophageal reflux disease)   . Helicobacter pylori infection 2015   treated with prevpac  . Hematochezia   . History of kidney stones   . Hx of colonic polyps    adenomatous  . Hyperlipidemia    Lipid profile on 02/25/2011: 209, 113, 55, 132  . Nephrolithiasis   . Pancreatitis chronic   . Renal calculus   . Stroke [facility]) 3-4 yrs ago   left sided weakness  . Tobacco abuse   . Tubular adenoma 2015  . Weight loss    CT negative for occult malignancy, negative celiac, negative adrenal insufficiency      Past Surgical History:  Procedure Laterality Date  . ABDOMINAL HYSTERECTOMY    . APPENDECTOMY    . CATARACT EXTRACTION W/PHACO Right 05/04/2013   Procedure: CATARACT EXTRACTION PHACO AND INTRAOCULAR LENS PLACEMENT (IOC);  Surgeon: Tonny Branch, MD;  Location: [facility];  Service: Ophthalmology;  Laterality: Right;  CDE:16.17  . CATARACT EXTRACTION W/PHACO Left 05/22/2013   Procedure: CATARACT EXTRACTION PHACO AND INTRAOCULAR LENS PLACEMENT (IOC);  Surgeon: Tonny Branch, MD;  Location: [facility];  Service: Ophthalmology;  Laterality: Left;  CDE:14.75  . COLONOSCOPY N/A 02/08/2014   Dr.Rourk- normal rectum, colonic polyps bx=tubular adenoma  . COLONOSCOPY W/ POLYPECTOMY  2006   WUJ:[id number], benign gastric nodule, multiple adenomatous polyps, one with tubular morphology  . COLONOSCOPY WITH PROPOFOL N/A 04/15/2017   normal  . ESOPHAGOGASTRODUODENOSCOPY  2006   AOZ:[id number] gastric nodule  . ESOPHAGOGASTRODUODENOSCOPY N/A 02/08/2014   Dr.Rourk- abnormal stomach and gstric nodule bx= hpylori  . EUS  2010   Dr. Ardis Hughs : Multiple shadowing calcifications in pancreas, consistent with Chronic Pancreatitis.  These are mainly confined to a collection of calcifications in head/uncinate pancreas. Otherwise the pancreatic parenchyma appears fairly normal.  The main pancreatic duct, CBD are both normal without stones, dilation.    . Ileocolonoscopy  01/11/2009  RMR: Polyp at the splenic flexure, status post hot snare removal/ Normal rectum, tubular adenoma  . PARTIAL HYSTERECTOMY      Family History  Problem Relation Age of Onset  . Heart attack Father 21  . Colon cancer Maternal Grandmother     Social History   Tobacco Use  . Smoking status: Current Every Day Smoker    Packs/day: 0.50    Years: 50.00    Pack years: 25.00    Types: Cigarettes    Start date: 07/18/1962  . Smokeless tobacco: Never Used  . Tobacco comment: 3/4 pack daily  Vaping Use  . Vaping Use: Never used  Substance Use Topics   . Alcohol use: No    Alcohol/week: 0.0 standard drinks    Comment: hx of ETOH about 20 years ago.   . Drug use: No    Medications: I have reviewed the patient's current medications. Allergies as of 07/16/2020      Reactions   Ibuprofen Anaphylaxis, Hives   Iohexol Hives, Swelling   HIVES AND SWELLING WITH I.V.P DYE, NEEDS PRE-MEDS.   Other Itching   Strawberry (diagnostic) Itching   Strawberry Extract Itching      Medication List       Accurate as of July 16, 2020 12:21 PM. If you have any questions, ask your nurse or doctor.        albuterol 108 (90 Base) MCG/ACT inhaler Commonly known as: VENTOLIN HFA Inhale 2 puffs into the lungs every 4 (four) hours as needed for wheezing or shortness of breath.   CALCIUM 600 PO Take 1 tablet by mouth daily.   cilostazol 100 MG tablet Commonly known as: PLETAL Take 100 mg by mouth 2 (two) times daily.   Creon 24000-76000 units Cpep Generic drug: Pancrelipase (Lip-Prot-Amyl) TAKE 2 CAPSULES WITH EACH MEALS AND 1 CAPSULE WITH SNACKS. MAX 2 SNACKS PER DAY.   cyclobenzaprine 5 MG tablet Commonly known as: FLEXERIL Take 5 mg by mouth at bedtime.   Cymbalta 60 MG capsule Generic drug: DULoxetine Take 60 mg by mouth 2 (two) times daily.   docusate sodium 100 MG capsule Commonly known as: COLACE Take 100 mg by mouth 2 (two) times daily.   escitalopram 10 MG tablet Commonly known as: LEXAPRO Take 10 mg by mouth daily.   hydrOXYzine 25 MG tablet Commonly known as: ATARAX/VISTARIL Take 1 tablet by mouth as needed for anxiety or itching.   Linzess 72 MCG capsule Generic drug: linaclotide TAKE ONE CAPSULE BY MOUTH DAILY BEFORE BREAKFAST.   LORazepam 1 MG tablet Commonly known as: ATIVAN Take 1 mg by mouth every 8 (eight) hours as needed for anxiety. Reported on 11/19/2015   nitroGLYCERIN 0.4 MG SL tablet Commonly known as: NITROSTAT Place 1 tablet (0.4 mg total) under the tongue every 5 (five) minutes as needed.    omeprazole 20 MG capsule Commonly known as: PRILOSEC TAKE 1 CAPSULE BY MOUTH TWICE DAILY.   oxyCODONE-acetaminophen 7.5-325 MG tablet Commonly known as: PERCOCET Take 1 tablet by mouth 4 (four) times daily as needed for moderate pain or severe pain.   potassium chloride SA 20 MEQ tablet Commonly known as: KLOR-CON Take 20 mEq by mouth daily.   Prolia 60 MG/ML Sosy injection Generic drug: denosumab Inject 60 mg into the skin every 6 (six) months.   promethazine 25 MG tablet Commonly known as: PHENERGAN Take 25 mg by mouth as needed for nausea or vomiting.   Restasis 0.05 % ophthalmic emulsion Generic drug: cycloSPORINE Place 1  drop into both eyes 2 (two) times daily.   Sinus Formula Tabs Take 1-2 tablets by mouth daily as needed (for cough and congestion).   Vitamin D (Ergocalciferol) 1.25 MG (50000 UNIT) Caps capsule Commonly known as: DRISDOL Take 50,000 Units by mouth every Tuesday.   Wixela Inhub 250-50 MCG/DOSE Aepb Generic drug: Fluticasone-Salmeterol Inhale 1 puff into the lungs as needed.        ROS:  A comprehensive review of systems was negative except for: Respiratory: positive for SOB Gastrointestinal: positive for abdominal pain, reflux symptoms and vomiting Musculoskeletal: positive for back pain, neck pain and joint pain  Blood pressure 109/71, pulse 85, temperature 98.1 F (36.7 C), temperature source Oral, resp. rate 16, height 5\' 4"  (1.626 m), weight 80 lb (36.3 kg), SpO2 95 %. Physical Exam Vitals reviewed.  Constitutional:      Appearance: She is cachectic.  HENT:     Head: Atraumatic.  Eyes:     Extraocular Movements: Extraocular movements intact.  Cardiovascular:     Rate and Rhythm: Normal rate.  Pulmonary:     Effort: Pulmonary effort is normal.  Abdominal:     General: Abdomen is scaphoid. There is no distension.     Palpations: Abdomen is soft.     Tenderness: There is abdominal tenderness in the right upper quadrant and  epigastric area.  Musculoskeletal:        General: Normal range of motion.     Cervical back: Normal range of motion.  Neurological:     General: No focal deficit present.     Mental Status: She is alert and oriented to person, place, and time.  Psychiatric:        Mood and Affect: Mood normal.        Behavior: Behavior normal.        Thought Content: Thought content normal.        Judgment: Judgment normal.     Results: CLINICAL DATA:  Abdominal pain and nausea and vomiting.  EXAM: NUCLEAR MEDICINE HEPATOBILIARY IMAGING WITH GALLBLADDER EF  TECHNIQUE: Sequential images of the abdomen were obtained out to 60 minutes following intravenous administration of radiopharmaceutical. After oral ingestion of Ensure, gallbladder ejection fraction was determined. At 60 min, normal ejection fraction is greater than 33%.  RADIOPHARMACEUTICALS:  5.2 mCi Tc-2m  Choletec IV  COMPARISON:  None.  FINDINGS: Prompt uptake and biliary excretion of activity by the liver is seen. Gallbladder activity is visualized, consistent with patency of cystic duct. Biliary activity passes into small bowel, consistent with patent common bile duct.  Calculated gallbladder ejection fraction is 89%. (Normal gallbladder ejection fraction with Ensure is greater than 33%.)  IMPRESSION: Normal hepatobiliary scan, demonstrating patency of cystic and common bile ducts.  Normal gallbladder ejection fraction.   Electronically Signed   By: Marlaine Hind M.D.   On: 06/18/2020 15:18  CLINICAL DATA:  Mid abdominal pain for 1 month  EXAM: ULTRASOUND ABDOMEN LIMITED RIGHT UPPER QUADRANT  COMPARISON:  04/08/2019  FINDINGS: Gallbladder:  No gallstones or wall thickening visualized. No sonographic Murphy sign noted by sonographer.  Common bile duct:  Diameter: 5 mm  Liver:  No focal lesion identified. Within normal limits in parenchymal echogenicity. Portal vein is patent on color  Doppler imaging with normal direction of blood flow towards the liver.  Other: None.  IMPRESSION: Unremarkable right upper quadrant ultrasound.   Electronically Signed   By: Randa Ngo M.D.   On: 05/28/2020 21:11  Assessment & Plan:  [name] is a 73 y.o. female with chronic abdominal pain for several years that is somewhat worsening with some weight loss from poor intake. She has chronic pancreatitis and could not get a celiac plexus block for the pain. She is chronic pain medication. I do not think her pain is related to the gallbladder and is more likely related to her chronic pancreatitis. She has not had imaging of the pancreas in some time and other options like surgical resection or decompression do exist for patients with refractory pain.    -GI can consider additional pancreatic imaging? MRI versus pancreas protocol CT to further assess versus referral back to [facility] care but patient may not want this as she may not want additional procedures  -Patient told at [facility] there is nothing else they could offer  -Would not recommend cholecystectomy in this patient as the risk outweigh the benefit   All questions were answered to the satisfaction of the patient and patient in agreement.     Virl Cagey 07/16/2020, 12:21 PM

## 2020-07-17 ENCOUNTER — Encounter: Payer: Self-pay | Admitting: Gastroenterology

## 2020-07-17 ENCOUNTER — Ambulatory Visit (INDEPENDENT_AMBULATORY_CARE_PROVIDER_SITE_OTHER): Payer: Medicare Other | Admitting: Gastroenterology

## 2020-07-17 VITALS — BP 99/65 | HR 99 | Temp 96.6°F | Ht 64.0 in | Wt 78.6 lb

## 2020-07-17 DIAGNOSIS — R1011 Right upper quadrant pain: Secondary | ICD-10-CM

## 2020-07-17 DIAGNOSIS — K5900 Constipation, unspecified: Secondary | ICD-10-CM

## 2020-07-17 DIAGNOSIS — K861 Other chronic pancreatitis: Secondary | ICD-10-CM

## 2020-07-17 MED ORDER — PANTOPRAZOLE SODIUM 40 MG PO TBEC: 40.0000 mg | DELAYED_RELEASE_TABLET | Freq: Two times a day (BID) | ORAL | 5 refills | Status: DC

## 2020-07-17 MED ORDER — LUBIPROSTONE 24 MCG PO CAPS: 24.0000 ug | ORAL_CAPSULE | Freq: Two times a day (BID) | ORAL | 3 refills | Status: DC

## 2020-07-17 NOTE — Patient Instructions (Addendum)
Stop Linzess. Let's start Amitiza twice a day with food. This is for constipation. This medication has a common side effect of nausea if not taken with food. Please call with how this works for you!  Let's stop Prilosec. I have sent in Protonix to take twice a day, 30 minutes before meals for reflux.  Please call if weight loss, blood in stools, vomiting, unable to tolerate diet.   We will see you in 4 months!  Have a wonderful birthday!  I enjoyed seeing you again today! As you know, I value our relationship and want to provide genuine, compassionate, and quality care. I welcome your feedback. If you receive a survey regarding your visit,  I greatly appreciate you taking time to fill this out. See you next time!  Annitta Needs, PhD, ANP-BC [facility]

## 2020-07-17 NOTE — Progress Notes (Addendum)
Referring Provider: Lemmie Evens, MD Primary Care Physician:  Lemmie Evens, MD Primary GI: Dr. Gala Romney   Chief Complaint  Patient presents with  . Follow-up    RUQ pain and back pain    HPI:   [name] is a 74 y.o. female presenting today with a history of advanced COPD, chronic pancreatitis, chronic constipation, chronic abdominal pain, and failure to thrive. Weight 75 in May 2021, Aug 2021 was 80. Highest weight 87. Today 78.6. History of H.pylori with documented eradication.   CT Aug 2018 negative for chronic mesenteric ischemia. HIDA updated due to RUQ pain, which was normal. Updated US unremarkable. She has seen General Surgery as of yesterday and tells me cholecystectomy not recommended.   GERD is worse. No dysphagia. Postprandial pain in RUQ. Associated nausea. No NSAIDs. Prilosec BID. No melena. Wonders if back pain is worsening this, too. Pain is always there. Waxes and wanes. Does not want to pursue EGD or CT.   BM about once per week, which she feels is her normal. Has had it like this since she was 89. Linzess 72 mcg daily. States that highest dosage of 290 didn't help.   Past Medical History:  Diagnosis Date  . Allergic rhinitis   . Anxiety   . Anxiety and depression   . Back pain, chronic   . CAD (coronary artery disease)    palpitations, dizziness, chest pain  . Cervical cancer (HCC)    cervical  . Chronic abdominal pain    with chronic nausea, diarrhea  . Collagen vascular disease ([place])   . COPD (chronic obstructive pulmonary disease) ([place])   . Depression   . GERD (gastroesophageal reflux disease)   . Helicobacter pylori infection 2015   treated with prevpac  . Hematochezia   . History of kidney stones   . Hx of colonic polyps    adenomatous  . Hyperlipidemia    Lipid profile on 02/25/2011: 209, 113, 55, 132  . Nephrolithiasis   . Pancreatitis chronic   . Renal calculus   . Stroke [facility]) 3-4 yrs ago   left sided weakness  . Tobacco  abuse   . Tubular adenoma 2015  . Weight loss    CT negative for occult malignancy, negative celiac, negative adrenal insufficiency    Past Surgical History:  Procedure Laterality Date  . ABDOMINAL HYSTERECTOMY    . APPENDECTOMY    . CATARACT EXTRACTION W/PHACO Right 05/04/2013   Procedure: CATARACT EXTRACTION PHACO AND INTRAOCULAR LENS PLACEMENT (IOC);  Surgeon: Tonny Branch, MD;  Location: [facility];  Service: Ophthalmology;  Laterality: Right;  CDE:16.17  . CATARACT EXTRACTION W/PHACO Left 05/22/2013   Procedure: CATARACT EXTRACTION PHACO AND INTRAOCULAR LENS PLACEMENT (IOC);  Surgeon: Tonny Branch, MD;  Location: [facility];  Service: Ophthalmology;  Laterality: Left;  CDE:14.75  . COLONOSCOPY N/A 02/08/2014   Dr.Rourk- normal rectum, colonic polyps bx=tubular adenoma  . COLONOSCOPY W/ POLYPECTOMY  2006   BCW:[id number], benign gastric nodule, multiple adenomatous polyps, one with tubular morphology  . COLONOSCOPY WITH PROPOFOL N/A 04/15/2017   normal  . ESOPHAGOGASTRODUODENOSCOPY  2006   UUE:[id number] gastric nodule  . ESOPHAGOGASTRODUODENOSCOPY N/A 02/08/2014   Dr.Rourk- abnormal stomach and gstric nodule bx= hpylori  . EUS  2010   Dr. Ardis Hughs : Multiple shadowing calcifications in pancreas, consistent with Chronic Pancreatitis.  These are mainly confined to a collection of calcifications in head/uncinate pancreas. Otherwise the pancreatic parenchyma appears fairly normal.  The main pancreatic duct, CBD are both normal  without stones, dilation.    . Ileocolonoscopy  01/11/2009   RMR: Polyp at the splenic flexure, status post hot snare removal/ Normal rectum, tubular adenoma  . PARTIAL HYSTERECTOMY      Current Outpatient Medications  Medication Sig Dispense Refill  . albuterol (PROVENTIL HFA;VENTOLIN HFA) 108 (90 Base) MCG/ACT inhaler Inhale 2 puffs into the lungs every 4 (four) hours as needed for wheezing or shortness of breath. 1 Inhaler 0  . Calcium Carbonate (CALCIUM 600 PO) Take 1 tablet by  mouth daily.      . cilostazol (PLETAL) 100 MG tablet Take 100 mg by mouth 2 (two) times daily.     Marland Kitchen CREON 24000-76000 units CPEP TAKE 2 CAPSULES WITH EACH MEALS AND 1 CAPSULE WITH SNACKS. MAX 2 SNACKS PER DAY. 240 capsule 5  . cyclobenzaprine (FLEXERIL) 5 MG tablet Take 5 mg by mouth at bedtime.    . docusate sodium (COLACE) 100 MG capsule Take 100 mg by mouth 2 (two) times daily.    . DULoxetine (CYMBALTA) 60 MG capsule Take 60 mg by mouth 2 (two) times daily.     Marland Kitchen escitalopram (LEXAPRO) 10 MG tablet Take 10 mg by mouth daily.    . Fluticasone-Salmeterol (WIXELA INHUB) 250-50 MCG/DOSE AEPB Inhale 1 puff into the lungs as needed.     . hydrOXYzine (ATARAX/VISTARIL) 25 MG tablet Take 1 tablet by mouth as needed for anxiety or itching.     Marland Kitchen LINZESS 72 MCG capsule TAKE ONE CAPSULE BY MOUTH DAILY BEFORE BREAKFAST. 90 capsule 3  . LORazepam (ATIVAN) 1 MG tablet Take 1 mg by mouth every 8 (eight) hours as needed for anxiety. Reported on 11/19/2015    . Misc Natural Products (SINUS FORMULA) TABS Take 1-2 tablets by mouth daily as needed (for cough and congestion).    . nitroGLYCERIN (NITROSTAT) 0.4 MG SL tablet Place 1 tablet (0.4 mg total) under the tongue every 5 (five) minutes as needed. 25 tablet 3  . omeprazole (PRILOSEC) 20 MG capsule TAKE 1 CAPSULE BY MOUTH TWICE DAILY. 180 capsule 0  . oxyCODONE-acetaminophen (PERCOCET) 7.5-325 MG tablet Take 1 tablet by mouth 4 (four) times daily as needed for moderate pain or severe pain.     . potassium chloride SA (K-DUR,KLOR-CON) 20 MEQ tablet Take 20 mEq by mouth daily.    Marland Kitchen PROLIA 60 MG/ML SOSY injection Inject 60 mg into the skin every 6 (six) months.     . promethazine (PHENERGAN) 25 MG tablet Take 25 mg by mouth as needed for nausea or vomiting.     . RESTASIS 0.05 % ophthalmic emulsion Place 1 drop into both eyes 2 (two) times daily.    . Vitamin D, Ergocalciferol, (DRISDOL) 50000 units CAPS capsule Take 50,000 Units by mouth every Tuesday.     .  lubiprostone (AMITIZA) 24 MCG capsule Take 1 capsule (24 mcg total) by mouth 2 (two) times daily with a meal. 60 capsule 3  . pantoprazole (PROTONIX) 40 MG tablet Take 1 tablet (40 mg total) by mouth 2 (two) times daily before a meal. 60 tablet 5   No current facility-administered medications for this visit.    Allergies as of 07/17/2020 - Review Complete 07/17/2020  Allergen Reaction Noted  . Ibuprofen Anaphylaxis and Hives 12/19/2008  . Iohexol Hives and Swelling 05/23/2004  . Other Itching 11/09/2015  . Strawberry (diagnostic) Itching 11/09/2015  . Strawberry extract Itching 09/29/2017    Family History  Problem Relation Age of Onset  . Heart attack Father  7  . Colon cancer Maternal Grandmother     Social History   Socioeconomic History  . Marital status: Divorced    Spouse name: Not on file  . Number of children: 3  . Years of education: Not on file  . Highest education level: Not on file  Occupational History  . Occupation: disabled    Fish farm manager: UNEMPLOYED  Tobacco Use  . Smoking status: Current Every Day Smoker    Packs/day: 0.75    Years: 50.00    Pack years: 37.50    Types: Cigarettes    Start date: 07/18/1962  . Smokeless tobacco: Never Used  . Tobacco comment: 3/4 pack daily  Vaping Use  . Vaping Use: Never used  Substance and Sexual Activity  . Alcohol use: No    Alcohol/week: 0.0 standard drinks    Comment: hx of ETOH about 20 years ago.   . Drug use: No  . Sexual activity: Never    Birth control/protection: Surgical  Other Topics Concern  . Not on file  Social History Narrative  . Not on file   Social Determinants of Health   Financial Resource Strain:   . Difficulty of Paying Living Expenses: Not on file  Food Insecurity:   . Worried About Charity fundraiser in the Last Year: Not on file  . Ran Out of Food in the Last Year: Not on file  Transportation Needs:   . Lack of Transportation (Medical): Not on file  . Lack of Transportation  (Non-Medical): Not on file  Physical Activity:   . Days of Exercise per Week: Not on file  . Minutes of Exercise per Session: Not on file  Stress:   . Feeling of Stress : Not on file  Social Connections:   . Frequency of Communication with Friends and Family: Not on file  . Frequency of Social Gatherings with Friends and Family: Not on file  . Attends Religious Services: Not on file  . Active Member of Clubs or Organizations: Not on file  . Attends Archivist Meetings: Not on file  . Marital Status: Not on file    Review of Systems: Gen: Denies fever, chills, anorexia. Denies fatigue, weakness, weight loss.  CV: Denies chest pain, palpitations, syncope, peripheral edema, and claudication. Resp: Denies dyspnea at rest, cough, wheezing, coughing up blood, and pleurisy. GI: see HPI Derm: Denies rash, itching, dry skin Psych: Denies depression, anxiety, memory loss, confusion. No homicidal or suicidal ideation.  Heme: Denies bruising, bleeding, and enlarged lymph nodes.  Physical Exam: BP 99/65   Pulse 99   Temp (!) 96.6 F (35.9 C) (Temporal)   Ht 5\' 4"  (1.626 m)   Wt 78 lb 9.6 oz (35.7 kg)   BMI 13.49 kg/m  General:   Alert and oriented. No distress noted. Pleasant and cooperative. Thin.  Head:  Normocephalic and atraumatic. Eyes:  Conjuctiva clear without scleral icterus. Mouth:  Mask in place Abdomen:  +BS, soft, TTP epigastric and RUQ and non-distended. No rebound or guarding. No HSM or masses noted. Msk:  Symmetrical without gross deformities. Normal posture. Extremities:  Without edema. Neurologic:  Alert and  oriented x4 Psych:  Alert and cooperative. Normal mood and affect.  ASSESSMENT: [name] is a 74 y.o. female presenting today with history of chronic pancreatitis, chronic abdominal pain, constipation, GERD and failure to thrive.  Chronic RUQ pain worsening; she has been evaluated for biliary etiology and overall unrevealing. She was seen by  Dr. Constance Haw  yesterday and cholecystectomy not recommended. Likely pain emanating from known history of chronic pancreatitis, with limited supportive options left. Prior attempt at celiac block unsuccessful. She is declining updated dedicated imaging of pancreas and EGD.   Constipation: not ideally managed. Change from Linzess to Amitiza 24 mcg po BID.  In setting of worsening GERD, will change Prilosec to Protonix, as she has been on Prilosec for quite some time.    PLAN:  Stop omeprazole. Start Protonix BID  Recommended updated imaging +/- EGD as last several years ago, but she is declining  Continue Creon  Stop Linzess. Trial of Amitiza 24 mcg po BID with food  4 month return  Annitta Needs, PhD, [facility]

## 2020-07-19 NOTE — Progress Notes (Signed)
CC'ED TO PCP 

## 2020-09-27 ENCOUNTER — Other Ambulatory Visit: Payer: Self-pay | Admitting: Gastroenterology

## 2020-11-19 ENCOUNTER — Ambulatory Visit: Payer: Medicare Other | Admitting: Gastroenterology

## 2020-12-16 ENCOUNTER — Emergency Department (HOSPITAL_COMMUNITY)
Admission: EM | Admit: 2020-12-16 | Discharge: 2020-12-16 | Disposition: A | Discharge status: Home / Self Care | Payer: Medicare Other | Attending: Emergency Medicine | Admitting: Emergency Medicine

## 2020-12-16 ENCOUNTER — Encounter (HOSPITAL_COMMUNITY): Payer: Self-pay | Admitting: *Deleted

## 2020-12-16 ENCOUNTER — Other Ambulatory Visit: Payer: Self-pay

## 2020-12-16 ENCOUNTER — Emergency Department (HOSPITAL_COMMUNITY): Payer: Medicare Other

## 2020-12-16 DIAGNOSIS — I2510 Atherosclerotic heart disease of native coronary artery without angina pectoris: Secondary | ICD-10-CM | POA: Insufficient documentation

## 2020-12-16 DIAGNOSIS — J449 Chronic obstructive pulmonary disease, unspecified: Secondary | ICD-10-CM | POA: Diagnosis not present

## 2020-12-16 DIAGNOSIS — Z8541 Personal history of malignant neoplasm of cervix uteri: Secondary | ICD-10-CM | POA: Diagnosis not present

## 2020-12-16 DIAGNOSIS — R0981 Nasal congestion: Secondary | ICD-10-CM | POA: Diagnosis not present

## 2020-12-16 DIAGNOSIS — F17210 Nicotine dependence, cigarettes, uncomplicated: Secondary | ICD-10-CM | POA: Diagnosis not present

## 2020-12-16 DIAGNOSIS — Z79899 Other long term (current) drug therapy: Secondary | ICD-10-CM | POA: Diagnosis not present

## 2020-12-16 DIAGNOSIS — J029 Acute pharyngitis, unspecified: Secondary | ICD-10-CM | POA: Insufficient documentation

## 2020-12-16 DIAGNOSIS — R609 Edema, unspecified: Secondary | ICD-10-CM | POA: Insufficient documentation

## 2020-12-16 MED ORDER — DEXAMETHASONE SODIUM PHOSPHATE 10 MG/ML IJ SOLN
10.0000 mg | Freq: Once | INTRAMUSCULAR | Status: AC
  Administered 2020-12-16: 10 mg via INTRAMUSCULAR
  Filled 2020-12-16: qty 1

## 2020-12-16 MED ORDER — ACETAMINOPHEN 325 MG PO TABS
650.0000 mg | ORAL_TABLET | Freq: Once | ORAL | Status: AC
  Administered 2020-12-16: 650 mg via ORAL
  Filled 2020-12-16: qty 2

## 2020-12-16 NOTE — Discharge Instructions (Signed)
Your CT scan shows chronic nasal and sinus congestion, but no active sinus infection.  Plan to keep your appointment with Dr. Wilburn Cornelia  as you have planned.  You have been given a steroid shot today which should help with inflammation of the nose and sinus's.  Continue taking your allergy medicine and your flonase nasal spray to help with congestion.

## 2020-12-16 NOTE — ED Triage Notes (Signed)
Nasal congestion with pressure behind eyes for 2 months

## 2020-12-16 NOTE — ED Notes (Signed)
Entered room and introduced self to patient. Pt appears to be resting in chair, respirations are even and unlabored with equal chest rise and fall. Call bell within reach. Pt educated on call light use and hourly rounding, verbalized understanding and in agreement at this time. All questions and concerns voiced addressed. Refreshments offered and provided per patient request.

## 2020-12-16 NOTE — ED Notes (Signed)
Patient transported to CT 

## 2020-12-16 NOTE — ED Provider Notes (Incomplete)
[facility] Provider Note   CSN: [id number] Arrival date & time: 12/16/20  1349     History Chief Complaint  Patient presents with  . Nasal Congestion    [name] is a 75 y.o. female   HPI     Past Medical History:  Diagnosis Date  . Allergic rhinitis   . Anxiety   . Anxiety and depression   . Back pain, chronic   . CAD (coronary artery disease)    palpitations, dizziness, chest pain  . Cervical cancer (HCC)    cervical  . Chronic abdominal pain    with chronic nausea, diarrhea  . Collagen vascular disease ([place])   . COPD (chronic obstructive pulmonary disease) ([place])   . Depression   . GERD (gastroesophageal reflux disease)   . Helicobacter pylori infection 2015   treated with prevpac  . Hematochezia   . History of kidney stones   . Hx of colonic polyps    adenomatous  . Hyperlipidemia    Lipid profile on 02/25/2011: 209, 113, 55, 132  . Nephrolithiasis   . Pancreatitis chronic   . Renal calculus   . Stroke [facility]) 3-4 yrs ago   left sided weakness  . Tobacco abuse   . Tubular adenoma 2015  . Weight loss    CT negative for occult malignancy, negative celiac, negative adrenal insufficiency    Patient Active Problem List   Diagnosis Date Noted  . History of Helicobacter pylori infection 05/14/2020  . Diarrhea 06/09/2017  . Colitis 07/01/2016  . RUQ pain 10/19/2012  . Nausea with vomiting 05/07/2012  . Constipation 05/07/2012  . Laboratory test 02/17/2012  . Hyperlipidemia   . Chest pain 01/07/2011  . ANOREXIA 05/27/2010  . WEIGHT LOSS 05/27/2010  . COLONIC POLYPS, ADENOMATOUS, HX OF 12/20/2008  . ANXIETY DEPRESSION 12/19/2008  . Tobacco abuse 12/19/2008  . ALLERGIC RHINITIS, SEASONAL 12/19/2008  . COPD 12/19/2008  . GERD 12/19/2008  . Chronic pancreatitis ([place]) 12/19/2008  . BACK PAIN, CHRONIC 12/19/2008  . Abdominal pain 12/19/2008  . CERVICAL CANCER, HX OF 12/19/2008  . Nephrolithiasis 12/19/2008    Past  Surgical History:  Procedure Laterality Date  . ABDOMINAL HYSTERECTOMY    . APPENDECTOMY    . CATARACT EXTRACTION W/PHACO Right 05/04/2013   Procedure: CATARACT EXTRACTION PHACO AND INTRAOCULAR LENS PLACEMENT (IOC);  Surgeon: Tonny Branch, MD;  Location: [facility];  Service: Ophthalmology;  Laterality: Right;  CDE:16.17  . CATARACT EXTRACTION W/PHACO Left 05/22/2013   Procedure: CATARACT EXTRACTION PHACO AND INTRAOCULAR LENS PLACEMENT (IOC);  Surgeon: Tonny Branch, MD;  Location: [facility];  Service: Ophthalmology;  Laterality: Left;  CDE:14.75  . COLONOSCOPY N/A 02/08/2014   Dr.Rourk- normal rectum, colonic polyps bx=tubular adenoma  . COLONOSCOPY W/ POLYPECTOMY  2006   [id number]:[id number], benign gastric nodule, multiple adenomatous polyps, one with tubular morphology  . COLONOSCOPY WITH PROPOFOL N/A 04/15/2017   normal  . ESOPHAGOGASTRODUODENOSCOPY  2006   LTJ:[id number] gastric nodule  . ESOPHAGOGASTRODUODENOSCOPY N/A 02/08/2014   Dr.Rourk- abnormal stomach and gstric nodule bx= hpylori  . EUS  2010   Dr. Ardis Hughs : Multiple shadowing calcifications in pancreas, consistent with Chronic Pancreatitis.  These are mainly confined to a collection of calcifications in head/uncinate pancreas. Otherwise the pancreatic parenchyma appears fairly normal.  The main pancreatic duct, CBD are both normal without stones, dilation.    . Ileocolonoscopy  01/11/2009   RMR: Polyp at the splenic flexure, status post hot snare removal/ Normal rectum, tubular adenoma  .  PARTIAL HYSTERECTOMY       OB History    Gravida  6   Para  3   Term  3   Preterm      AB  3   Living        SAB  3   IAB      Ectopic      Multiple      Live Births              Family History  Problem Relation Age of Onset  . Heart attack Father 23  . Colon cancer Maternal Grandmother     Social History   Tobacco Use  . Smoking status: Current Every Day Smoker    Packs/day: 0.75    Years: 50.00    Pack years: 37.50    Types:  Cigarettes    Start date: 07/18/1962  . Smokeless tobacco: Never Used  . Tobacco comment: 3/4 pack daily  Vaping Use  . Vaping Use: Never used  Substance Use Topics  . Alcohol use: No    Alcohol/week: 0.0 standard drinks    Comment: hx of ETOH about 20 years ago.   . Drug use: No    Home Medications Prior to Admission medications   Medication Sig Start Date End Date Taking? Authorizing Provider  albuterol (PROVENTIL HFA;VENTOLIN HFA) 108 (90 Base) MCG/ACT inhaler Inhale 2 puffs into the lungs every 4 (four) hours as needed for wheezing or shortness of breath. 02/12/18   Francine Graven, DO  Calcium Carbonate (CALCIUM 600 PO) Take 1 tablet by mouth daily.      [provider]  cilostazol (PLETAL) 100 MG tablet Take 100 mg by mouth 2 (two) times daily.  03/29/15   [provider]  CREON 24000-76000 units CPEP TAKE 2 CAPSULES WITH EACH MEALS AND 1 CAPSULE WITH SNACKS. MAX 2 SNACKS PER DAY. 10/02/20   Aliene Altes S, PA-C  cyclobenzaprine (FLEXERIL) 5 MG tablet Take 5 mg by mouth at bedtime. 05/31/18   [provider]  docusate sodium (COLACE) 100 MG capsule Take 100 mg by mouth 2 (two) times daily.    [provider]  DULoxetine (CYMBALTA) 60 MG capsule Take 60 mg by mouth 2 (two) times daily.     [provider]  escitalopram (LEXAPRO) 10 MG tablet Take 10 mg by mouth daily. 01/30/17   [provider]  Fluticasone-Salmeterol (WIXELA INHUB) 250-50 MCG/DOSE AEPB Inhale 1 puff into the lungs as needed.     [provider]  hydrOXYzine (ATARAX/VISTARIL) 25 MG tablet Take 1 tablet by mouth as needed for anxiety or itching.  04/21/16   [provider]  LINZESS 72 MCG capsule TAKE ONE CAPSULE BY MOUTH DAILY BEFORE BREAKFAST. 03/27/20   Annitta Needs, NP  LORazepam (ATIVAN) 1 MG tablet Take 1 mg by mouth every 8 (eight) hours as needed for anxiety. Reported on 11/19/2015    [provider]  lubiprostone (AMITIZA) 24 MCG  capsule Take 1 capsule (24 mcg total) by mouth 2 (two) times daily with a meal. 07/17/20   Annitta Needs, NP  Misc Natural Products (SINUS FORMULA) TABS Take 1-2 tablets by mouth daily as needed (for cough and congestion).    [provider]  nitroGLYCERIN (NITROSTAT) 0.4 MG SL tablet Place 1 tablet (0.4 mg total) under the tongue every 5 (five) minutes as needed. 11/21/14   Herminio Commons, MD  omeprazole (PRILOSEC) 20 MG capsule TAKE 1 CAPSULE BY MOUTH TWICE  DAILY. 06/28/20   Mahala Menghini, PA-C  oxyCODONE-acetaminophen (PERCOCET) 7.5-325 MG tablet Take 1 tablet by mouth 4 (four) times daily as needed for moderate pain or severe pain.  06/02/18   [provider]  pantoprazole (PROTONIX) 40 MG tablet Take 1 tablet (40 mg total) by mouth 2 (two) times daily before a meal. 07/17/20   Annitta Needs, NP  potassium chloride SA (K-DUR,KLOR-CON) 20 MEQ tablet Take 20 mEq by mouth daily.    [provider]  PROLIA 60 MG/ML SOSY injection Inject 60 mg into the skin every 6 (six) months.  05/31/18   [provider]  promethazine (PHENERGAN) 25 MG tablet Take 25 mg by mouth as needed for nausea or vomiting.     [provider]  RESTASIS 0.05 % ophthalmic emulsion Place 1 drop into both eyes 2 (two) times daily. 03/27/16   [provider]  Vitamin D, Ergocalciferol, (DRISDOL) 50000 units CAPS capsule Take 50,000 Units by mouth every Tuesday.     [provider]    Allergies    Ibuprofen, Iohexol, Other, Strawberry (diagnostic), and Strawberry extract  Review of Systems   Review of Systems  Physical Exam Updated Vital Signs BP 100/73   Pulse (!) 114   Temp 98 F (36.7 C) (Oral)   Resp 20   Ht 5\' 3"  (1.6 m)   Wt 36.3 kg   SpO2 96%   BMI 14.17 kg/m   Physical Exam  ED Results / Procedures / Treatments   Labs (all labs ordered are listed, but only abnormal results are displayed) Labs Reviewed - No data to  display  EKG None  Radiology No results found.  Procedures Procedures {Remember to document critical care time when appropriate:1}  Medications Ordered in ED Medications - No data to display  ED Course  I have reviewed the triage vital signs and the nursing notes.  Pertinent labs & imaging results that were available during my care of the patient were reviewed by me and considered in my medical decision making (see chart for details).    MDM Rules/Calculators/A&P                          *** Final Clinical Impression(s) / ED Diagnoses Final diagnoses:  None    Rx / DC Orders ED Discharge Orders    None

## 2020-12-16 NOTE — ED Notes (Signed)
ED Provider at bedside. 

## 2020-12-17 NOTE — ED Provider Notes (Signed)
[facility] Provider Note   CSN: [id number] Arrival date & time: 12/16/20  1349     History Chief Complaint  Patient presents with  . Nasal Congestion    [name] is a 75 y.o. female with a history including allergic rhinitis, Copd, GERD, chronic abdominal pain and tobacco abuse with a history of frequent sinus infections presenting with complaint constant nasal congestion and pressure behind her eyes which has been constant for the past 2 months. She also endorses chronic sore throat secondary to post nasal drip.  She cannot clear her nose by blowing, states sometimes coughs up thick sputum, but mostly PND is watery.  She has been treated with multiple doses of antibiotics including keflex completed within the past 10 days which did not relieve symptoms.  She believes her symptoms are somehow related to history of severe dental infections and frequently had to get her gums "opened and scraped", but is now edentulous, still has complaint of gum tenderness but no current swelling or localizing pain.  She has been referred to Dr. Wilburn Cornelia of ENT , appt on 4/10, but does feel she can wait that long.  She is currently taking benadryl for allergy sx as allegra and claritin have not been helpful.  Also uses nyquill sinus and has a nasal spray (unsure of name) but again not helping sx.  She denies loss of hearing, dizziness, fevers or chills.   The history is provided by the patient.       Past Medical History:  Diagnosis Date  . Allergic rhinitis   . Anxiety   . Anxiety and depression   . Back pain, chronic   . CAD (coronary artery disease)    palpitations, dizziness, chest pain  . Cervical cancer (HCC)    cervical  . Chronic abdominal pain    with chronic nausea, diarrhea  . Collagen vascular disease ([place])   . COPD (chronic obstructive pulmonary disease) ([place])   . Depression   . GERD (gastroesophageal reflux disease)   . Helicobacter pylori infection  2015   treated with prevpac  . Hematochezia   . History of kidney stones   . Hx of colonic polyps    adenomatous  . Hyperlipidemia    Lipid profile on 02/25/2011: 209, 113, 55, 132  . Nephrolithiasis   . Pancreatitis chronic   . Renal calculus   . Stroke [facility]) 3-4 yrs ago   left sided weakness  . Tobacco abuse   . Tubular adenoma 2015  . Weight loss    CT negative for occult malignancy, negative celiac, negative adrenal insufficiency    Patient Active Problem List   Diagnosis Date Noted  . History of Helicobacter pylori infection 05/14/2020  . Diarrhea 06/09/2017  . Colitis 07/01/2016  . RUQ pain 10/19/2012  . Nausea with vomiting 05/07/2012  . Constipation 05/07/2012  . Laboratory test 02/17/2012  . Hyperlipidemia   . Chest pain 01/07/2011  . ANOREXIA 05/27/2010  . WEIGHT LOSS 05/27/2010  . COLONIC POLYPS, ADENOMATOUS, HX OF 12/20/2008  . ANXIETY DEPRESSION 12/19/2008  . Tobacco abuse 12/19/2008  . ALLERGIC RHINITIS, SEASONAL 12/19/2008  . COPD 12/19/2008  . GERD 12/19/2008  . Chronic pancreatitis ([place]) 12/19/2008  . BACK PAIN, CHRONIC 12/19/2008  . Abdominal pain 12/19/2008  . CERVICAL CANCER, HX OF 12/19/2008  . Nephrolithiasis 12/19/2008    Past Surgical History:  Procedure Laterality Date  . ABDOMINAL HYSTERECTOMY    . APPENDECTOMY    . CATARACT EXTRACTION W/PHACO  Right 05/04/2013   Procedure: CATARACT EXTRACTION PHACO AND INTRAOCULAR LENS PLACEMENT (IOC);  Surgeon: Tonny Branch, MD;  Location: [facility];  Service: Ophthalmology;  Laterality: Right;  CDE:16.17  . CATARACT EXTRACTION W/PHACO Left 05/22/2013   Procedure: CATARACT EXTRACTION PHACO AND INTRAOCULAR LENS PLACEMENT (IOC);  Surgeon: Tonny Branch, MD;  Location: [facility];  Service: Ophthalmology;  Laterality: Left;  CDE:14.75  . COLONOSCOPY N/A 02/08/2014   Dr.Rourk- normal rectum, colonic polyps bx=tubular adenoma  . COLONOSCOPY W/ POLYPECTOMY  2006   [id number], benign gastric nodule, multiple adenomatous  polyps, one with tubular morphology  . COLONOSCOPY WITH PROPOFOL N/A 04/15/2017   normal  . ESOPHAGOGASTRODUODENOSCOPY  2006   TAV:[id number] gastric nodule  . ESOPHAGOGASTRODUODENOSCOPY N/A 02/08/2014   Dr.Rourk- abnormal stomach and gstric nodule bx= hpylori  . EUS  2010   Dr. Ardis Hughs : Multiple shadowing calcifications in pancreas, consistent with Chronic Pancreatitis.  These are mainly confined to a collection of calcifications in head/uncinate pancreas. Otherwise the pancreatic parenchyma appears fairly normal.  The main pancreatic duct, CBD are both normal without stones, dilation.    . Ileocolonoscopy  01/11/2009   RMR: Polyp at the splenic flexure, status post hot snare removal/ Normal rectum, tubular adenoma  . PARTIAL HYSTERECTOMY       OB History    Gravida  6   Para  3   Term  3   Preterm      AB  3   Living        SAB  3   IAB      Ectopic      Multiple      Live Births              Family History  Problem Relation Age of Onset  . Heart attack Father 30  . Colon cancer Maternal Grandmother     Social History   Tobacco Use  . Smoking status: Current Every Day Smoker    Packs/day: 0.75    Years: 50.00    Pack years: 37.50    Types: Cigarettes    Start date: 07/18/1962  . Smokeless tobacco: Never Used  . Tobacco comment: 3/4 pack daily  Vaping Use  . Vaping Use: Never used  Substance Use Topics  . Alcohol use: No    Alcohol/week: 0.0 standard drinks    Comment: hx of ETOH about 20 years ago.   . Drug use: No    Home Medications Prior to Admission medications   Medication Sig Start Date End Date Taking? Authorizing Provider  albuterol (PROVENTIL HFA;VENTOLIN HFA) 108 (90 Base) MCG/ACT inhaler Inhale 2 puffs into the lungs every 4 (four) hours as needed for wheezing or shortness of breath. 02/12/18   Francine Graven, DO  Calcium Carbonate (CALCIUM 600 PO)     [provider]  cilostazol (PLETAL) 100 MG tablet Take 100 mg by mouth 2  (two) times daily.  03/29/15   [provider]  CREON 24000-76000 units CPEP TAKE 2 CAPSULES WITH EACH MEALS AND 1 CAPSULE WITH SNACKS. MAX 2 SNACKS PER DAY. 10/02/20   Aliene Altes S, PA-C  cyclobenzaprine (FLEXERIL) 5 MG tablet Take 5 mg by mouth at bedtime. 05/31/18   [provider]  docusate sodium (COLACE) 100 MG capsule Take 100 mg by mouth 2 (two) times daily.    [provider]  DULoxetine (CYMBALTA) 60 MG capsule Take 60 mg by mouth 2 (two) times daily.     [provider]  escitalopram (  LEXAPRO) 10 MG tablet Take 10 mg by mouth daily. 01/30/17   [provider]  Fluticasone-Salmeterol (WIXELA INHUB) 250-50 MCG/DOSE AEPB Inhale 1 puff into the lungs as needed.     [provider]  hydrOXYzine (ATARAX/VISTARIL) 25 MG tablet Take 1 tablet by mouth as needed for anxiety or itching.  04/21/16   [provider]  LINZESS 72 MCG capsule TAKE ONE CAPSULE BY MOUTH DAILY BEFORE BREAKFAST. 03/27/20   Annitta Needs, NP  LORazepam (ATIVAN) 1 MG tablet Take 1 mg by mouth every 8 (eight) hours as needed for anxiety. Reported on 11/19/2015    [provider]  lubiprostone (AMITIZA) 24 MCG capsule Take 1 capsule (24 mcg total) by mouth 2 (two) times daily with a meal. 07/17/20   Annitta Needs, NP  Misc Natural Products (SINUS FORMULA) TABS Take 1-2 tablets by mouth daily as needed (for cough and congestion).    [provider]  nitroGLYCERIN (NITROSTAT) 0.4 MG SL tablet Place 1 tablet (0.4 mg total) under the tongue every 5 (five) minutes as needed. 11/21/14   Herminio Commons, MD  omeprazole (PRILOSEC) 20 MG capsule TAKE 1 CAPSULE BY MOUTH TWICE DAILY. 06/28/20   Mahala Menghini, PA-C  oxyCODONE-acetaminophen (PERCOCET) 7.5-325 MG tablet Take 1 tablet by mouth 4 (four) times daily as needed for moderate pain or severe pain.  06/02/18   [provider]  pantoprazole (PROTONIX) 40 MG tablet Take 1 tablet (40 mg total) by mouth 2  (two) times daily before a meal. 07/17/20   Annitta Needs, NP  potassium chloride SA (K-DUR,KLOR-CON) 20 MEQ tablet Take 20 mEq by mouth daily.    [provider]  PROLIA 60 MG/ML SOSY injection Inject 60 mg into the skin every 6 (six) months.  05/31/18   [provider]  promethazine (PHENERGAN) 25 MG tablet Take 25 mg by mouth as needed for nausea or vomiting.     [provider]  RESTASIS 0.05 % ophthalmic emulsion Place 1 drop into both eyes 2 (two) times daily. 03/27/16   [provider]  Vitamin D, Ergocalciferol, (DRISDOL) 50000 units CAPS capsule Take 50,000 Units by mouth every Tuesday.     [provider]    Allergies    Ibuprofen, Iohexol, Other, Strawberry (diagnostic), and Strawberry extract  Review of Systems   Review of Systems  Constitutional: Negative for chills and fever.  HENT: Positive for congestion, ear pain, postnasal drip, sinus pressure, sinus pain and sore throat. Negative for rhinorrhea, trouble swallowing and voice change.   Eyes: Negative for discharge.  Respiratory: Positive for cough. Negative for shortness of breath, wheezing and stridor.   Cardiovascular: Negative for chest pain.  Gastrointestinal: Negative for abdominal pain.  Genitourinary: Negative.   All other systems reviewed and are negative.   Physical Exam Updated Vital Signs BP 110/73 (BP Location: Right Arm)   Pulse 96   Temp 98 F (36.7 C) (Oral)   Resp 20   Ht 5\' 3"  (1.6 m)   Wt 36.3 kg   SpO2 95%   BMI 14.17 kg/m   Physical Exam Constitutional:      Appearance: She is well-developed.  HENT:     Head: Normocephalic and atraumatic.     Right Ear: Tympanic membrane and ear canal normal. No middle ear effusion. Tympanic membrane is not injected.     Left Ear: Tympanic membrane and ear canal normal.  No middle ear effusion. Tympanic membrane is not injected.  Nose: Mucosal edema and congestion present. No rhinorrhea.     Right Turbinates:  Enlarged.     Right Sinus: Maxillary sinus tenderness present.     Left Sinus: Maxillary sinus tenderness present.     Mouth/Throat:     Mouth: Mucous membranes are moist.     Pharynx: Oropharynx is clear. Uvula midline. No oropharyngeal exudate or posterior oropharyngeal erythema.     Tonsils: No tonsillar abscesses.  Eyes:     Conjunctiva/sclera: Conjunctivae normal.  Cardiovascular:     Rate and Rhythm: Normal rate.     Heart sounds: Normal heart sounds.  Pulmonary:     Effort: Pulmonary effort is normal. No respiratory distress.     Breath sounds: No wheezing or rales.  Abdominal:     Palpations: Abdomen is soft.     Tenderness: There is no abdominal tenderness.  Musculoskeletal:        General: Normal range of motion.  Lymphadenopathy:     Cervical: No cervical adenopathy.     Comments: No head or neck adenopathy  Skin:    General: Skin is warm and dry.     Findings: No rash.  Neurological:     Mental Status: She is alert and oriented to person, place, and time.     ED Results / Procedures / Treatments   Labs (all labs ordered are listed, but only abnormal results are displayed) Labs Reviewed - No data to display  EKG None  Radiology CT Maxillofacial Wo Contrast  Result Date: 12/16/2020 CLINICAL DATA:  Chronic sinus pain. Additional provided: Patient reports intermittent facial swelling since balance for 3 months. Treated with antibiotics without relief. EXAM: CT MAXILLOFACIAL WITHOUT CONTRAST TECHNIQUE: Multidetector CT images of the paranasal sinuses were obtained using the standard protocol without intravenous contrast. COMPARISON:  Maxillofacial CT 06/27/2019. FINDINGS: Paranasal sinuses: Frontal: Trace mucosal thickening within the inferior left frontal sinus. Partial opacification of the left frontal sinus drainage pathway. Normally aerated right frontal sinus. Patent right frontal sinus drainage pathway. Ethmoid: Minimal mucosal thickening within the left  frontoethmoidal recess. The ethmoid air cells are otherwise normally aerated. Maxillary: Mild mucosal thickening and small fluid level within the right maxillary sinus. The left maxillary sinus is normally aerated. Sphenoid: Hypoplastic right sphenoid sinus containing minimal mucosal thickening. The dominant left sphenoid sinus is normally aerated. Patent sphenoethmoidal recesses. Right ostiomeatal unit: Patent. Left ostiomeatal unit: Patent. Nasal passages: Mild leftward bony spurring of the nasal septum posteriorly. Mild rightward deviation and bony spurring of the nasal septum more anteriorly. Mild mucosal thickening within the bilateral nasal passages. Anatomy: Pneumatization is present superior to the anterior ethmoid notches bilaterally. Symmetric and intact olfactory grooves and fovea ethmoidalis, Keros I (1-10mm). Sellar sphenoid pneumatization pattern. Other: The patient is edentulous. No appreciable maxillofacial soft tissue swelling. The orbits are unremarkable. The mastoid air cells are excluded from the field of view. Bilateral TMJ osteoarthrosis. IMPRESSION: Persistent small air-fluid level and mild mucosal thickening within the right maxillary sinus, improved as compared to the prior maxillofacial CT of 06/27/2019. Redemonstrated trace mucosal thickening within the inferior left frontal sinus with mucosal thickening also partially obstructed left frontal sinus drainage pathway. Minimal mucosal thickening within a hypoplastic right sphenoid sinus. The right sphenoid sinus previously contained moderate fluid. Mild leftward bony spurring of the nasal septum posteriorly. Mild rightward deviation and bony spurring of the nasal septum anteriorly. Mild mucosal thickening within the bilateral nasal passages. The patient is edentulous. No appreciable maxillofacial soft tissue swelling. Bilateral TMJ osteoarthrosis.  Electronically Signed   By: Kellie Simmering DO   On: 12/16/2020 15:46    Procedures Procedures    Medications Ordered in ED Medications  dexamethasone (DECADRON) injection 10 mg (10 mg Intramuscular Given 12/16/20 1635)  acetaminophen (TYLENOL) tablet 650 mg (650 mg Oral Given 12/16/20 1635)    ED Course  I have reviewed the triage vital signs and the nursing notes.  Pertinent labs & imaging results that were available during my care of the patient were reviewed by me and considered in my medical decision making (see chart for details).    MDM Rules/Calculators/A&P                          CT imaging completed secondary to chronicity of sx and lack of response to otc meds and lack of response to courses of abx.  No acute sinusitis, no mass identified. Exam c/w chronic sinusitis.  Pt just completed course of abx, afebrile, no indication for additional abx. Has appt with Dr. Wilburn Cornelia in 2 weeks, pt states steroids injections have given temp relief - given decadron 10 mg. Advised to continue other home meds until she sees Dr. Wilburn Cornelia.  Final Clinical Impression(s) / ED Diagnoses Final diagnoses:  Nasal congestion    Rx / DC Orders ED Discharge Orders    None       Landis Martins 12/18/20 [place], MD 12/20/20 [phone]

## 2020-12-24 ENCOUNTER — Ambulatory Visit: Payer: Medicare Other | Admitting: Gastroenterology

## 2020-12-24 ENCOUNTER — Encounter: Payer: Self-pay | Admitting: Internal Medicine

## 2020-12-27 ENCOUNTER — Other Ambulatory Visit: Payer: Self-pay | Admitting: Gastroenterology

## 2021-01-24 ENCOUNTER — Other Ambulatory Visit (HOSPITAL_COMMUNITY): Payer: Self-pay | Admitting: Family Medicine

## 2021-01-24 DIAGNOSIS — M545 Low back pain, unspecified: Secondary | ICD-10-CM

## 2021-01-24 DIAGNOSIS — M542 Cervicalgia: Secondary | ICD-10-CM

## 2021-02-05 ENCOUNTER — Ambulatory Visit: Payer: Medicare Other | Admitting: Gastroenterology

## 2021-02-10 ENCOUNTER — Ambulatory Visit (HOSPITAL_COMMUNITY)
Admission: RE | Admit: 2021-02-10 | Discharge: 2021-02-10 | Disposition: A | Discharge status: Home / Self Care | Payer: Medicare Other | Source: Ambulatory Visit | Attending: Family Medicine | Admitting: Family Medicine

## 2021-02-10 ENCOUNTER — Other Ambulatory Visit: Payer: Self-pay

## 2021-02-10 DIAGNOSIS — M542 Cervicalgia: Secondary | ICD-10-CM | POA: Diagnosis present

## 2021-02-10 DIAGNOSIS — M545 Low back pain, unspecified: Secondary | ICD-10-CM | POA: Diagnosis not present

## 2021-02-20 ENCOUNTER — Ambulatory Visit: Payer: Medicare Other | Admitting: Gastroenterology

## 2021-04-08 ENCOUNTER — Other Ambulatory Visit (HOSPITAL_COMMUNITY): Payer: Self-pay | Admitting: Nurse Practitioner

## 2021-04-08 ENCOUNTER — Other Ambulatory Visit: Payer: Self-pay | Admitting: Nurse Practitioner

## 2021-04-08 DIAGNOSIS — M542 Cervicalgia: Secondary | ICD-10-CM

## 2021-04-21 ENCOUNTER — Ambulatory Visit (HOSPITAL_COMMUNITY): Payer: Medicare Other | Attending: Nurse Practitioner

## 2021-04-21 ENCOUNTER — Encounter (HOSPITAL_COMMUNITY): Payer: Self-pay

## 2021-06-20 IMAGING — US US ABDOMEN LIMITED
1 series · 14 of 25 positions shown · non-contrast
Comparison: 04/08/2019

CLINICAL DATA: Mid abdominal pain for 1 month

EXAM:
ULTRASOUND ABDOMEN LIMITED RIGHT UPPER QUADRANT

[Series 1: us abdomen limited ruq · 14 of 76 slices shown]
[im 1/76]
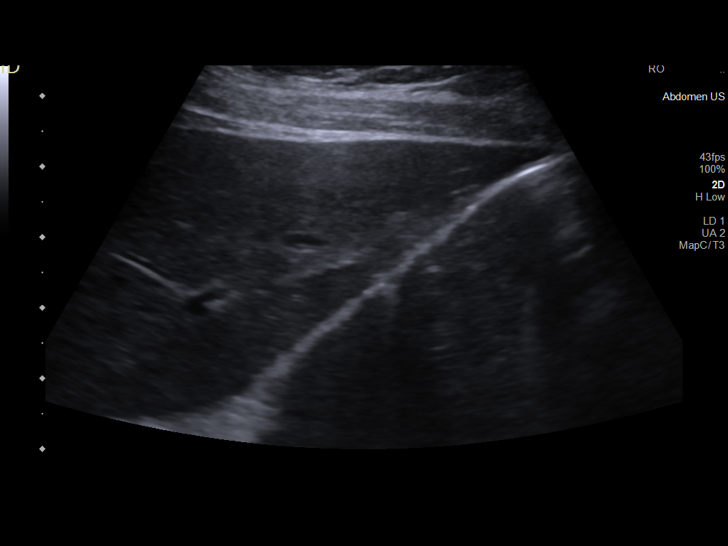
[im 7/76]
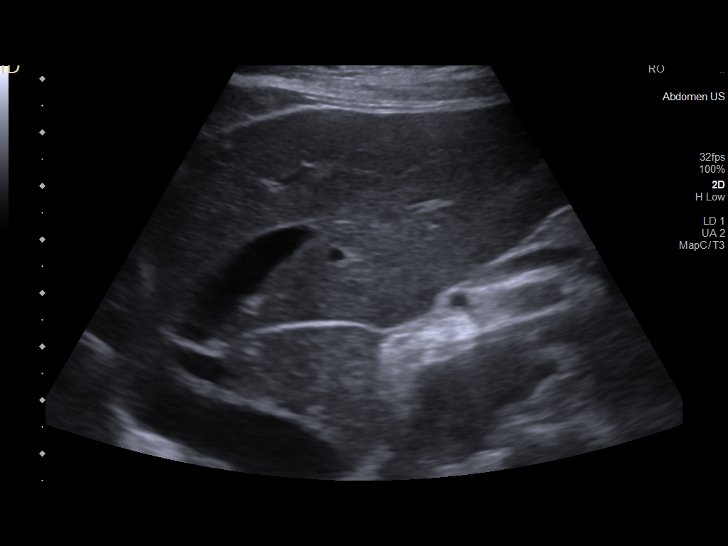
[im 13/76]
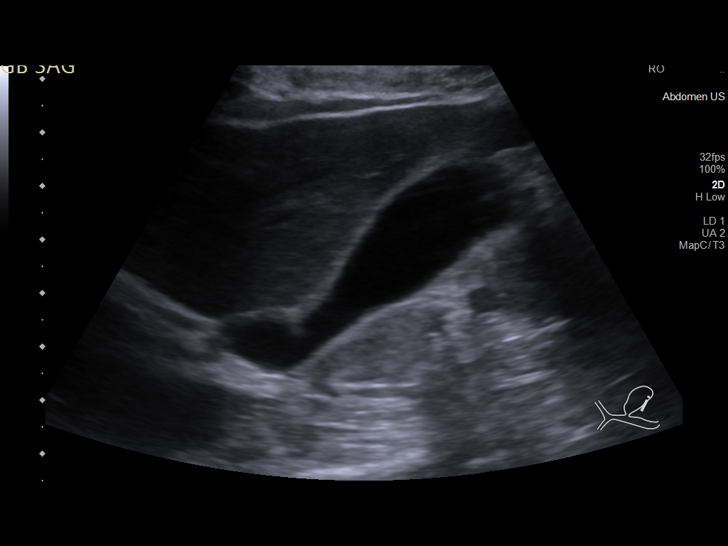
[im 19/76]
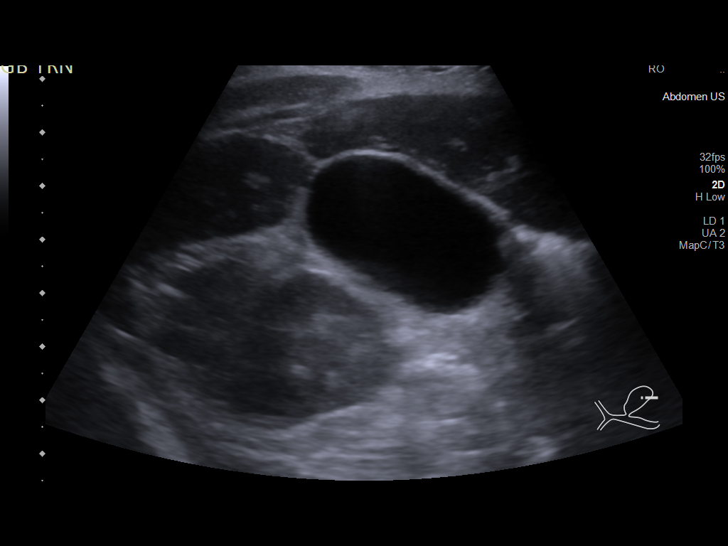
[im 26/76]
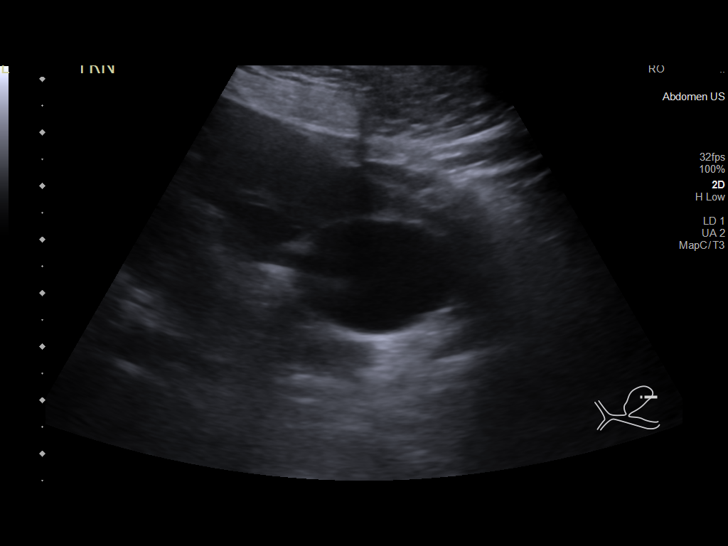
[im 29/76]
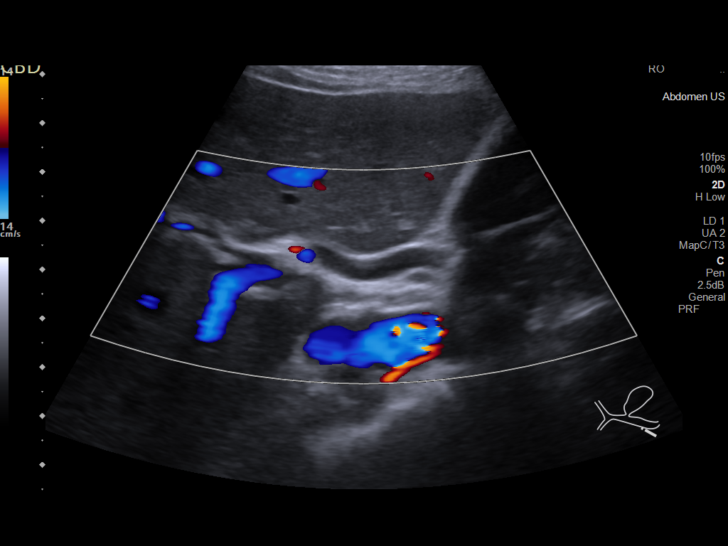
[im 35/76]
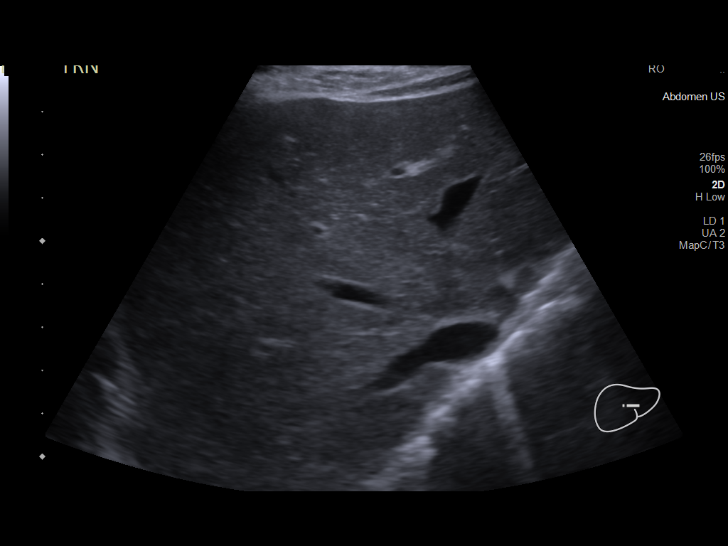
[im 41/76]
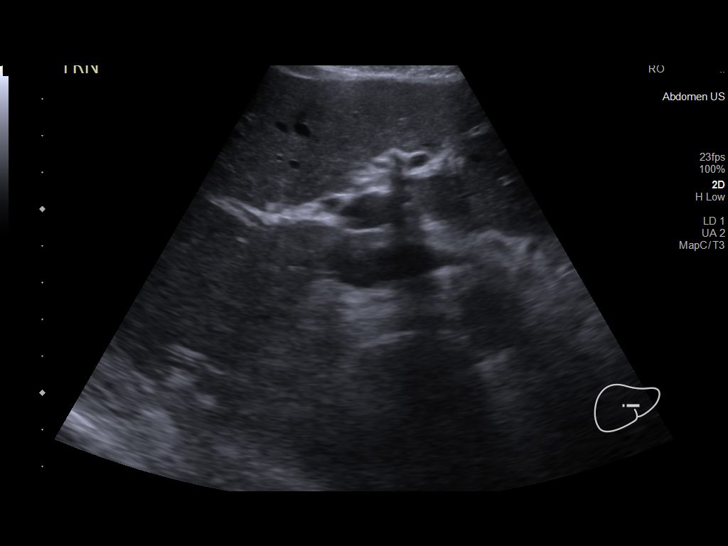
[im 47/76]
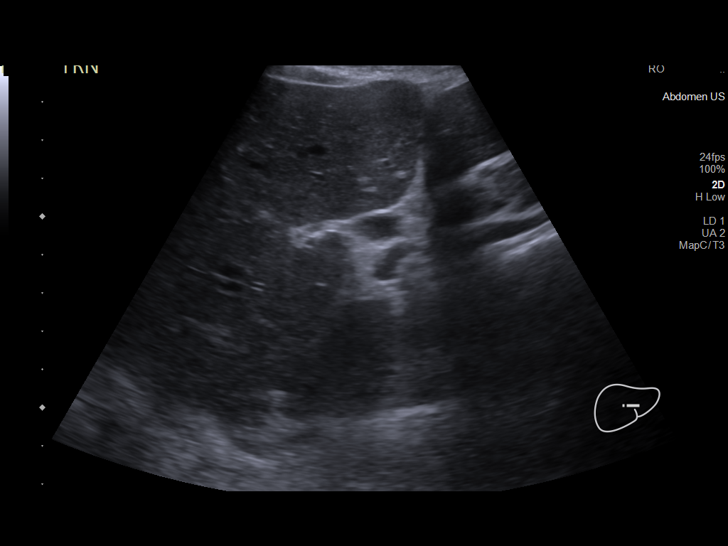
[im 51/76]
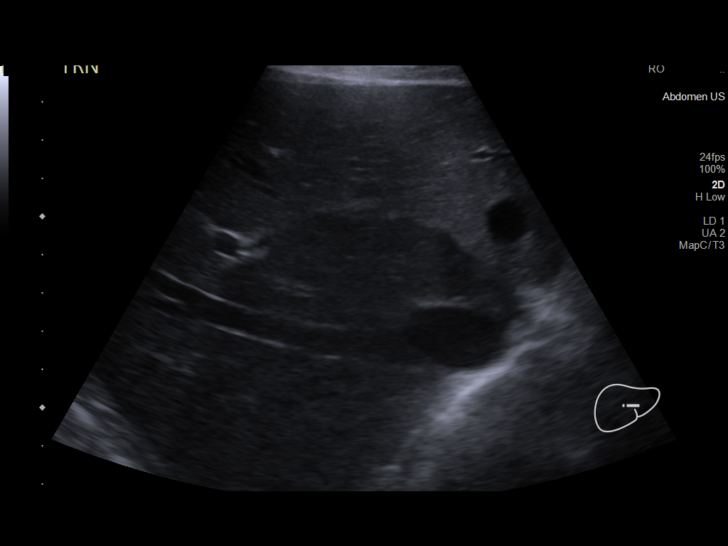
[im 57/76]
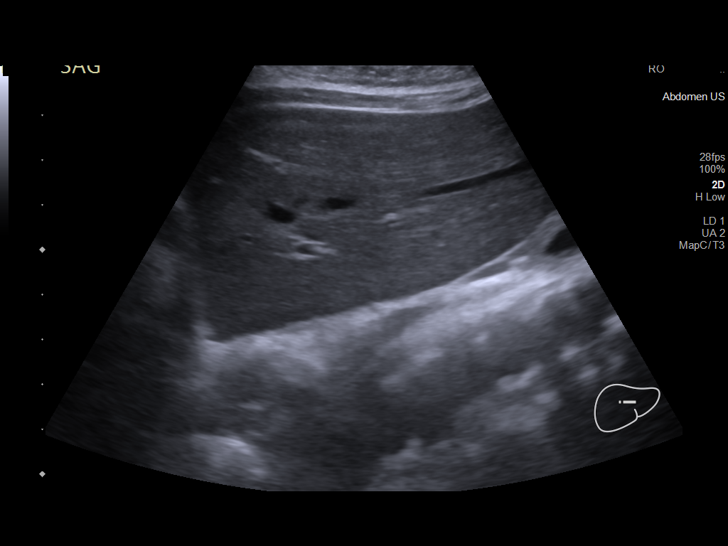
[im 63/76]
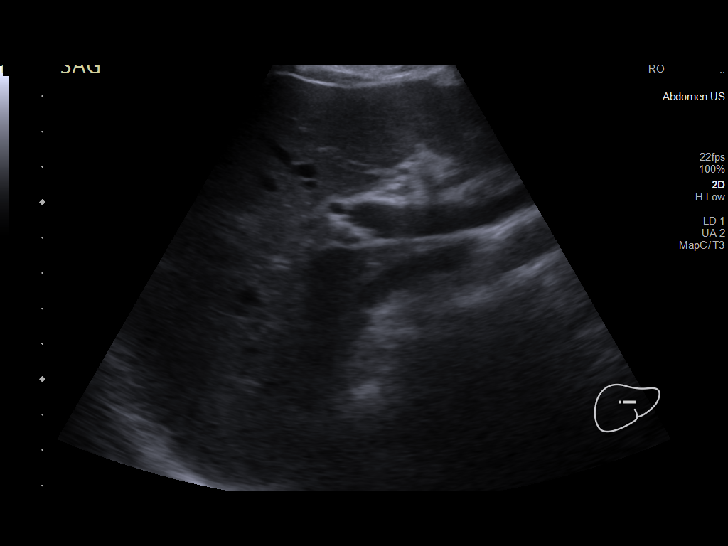
[im 69/76]
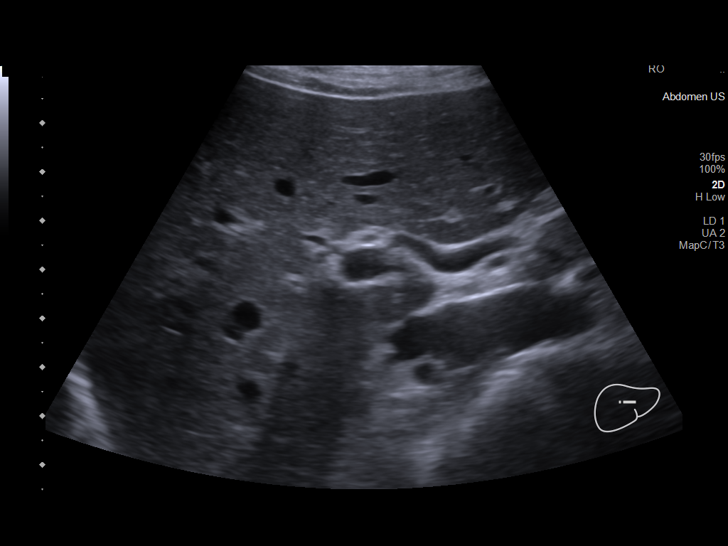
[im 76/76]
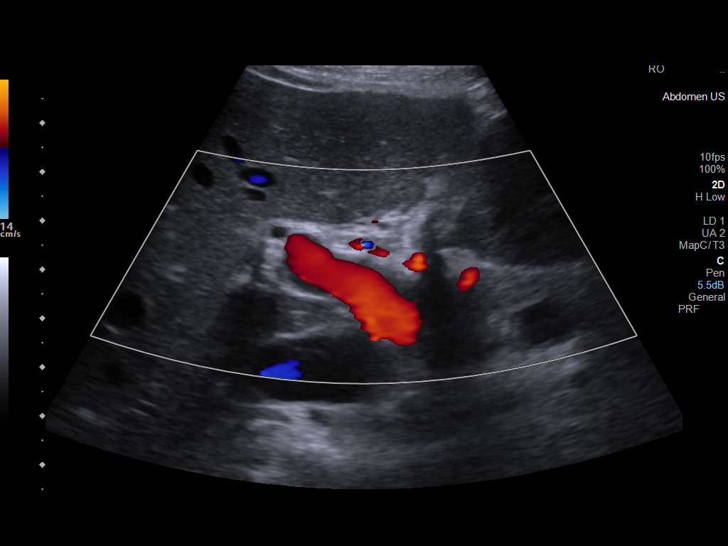

[14 of 25 positions shown; findings below may reference images not displayed]

FINDINGS: Gallbladder:

No gallstones or wall thickening visualized. No sonographic Murphy
sign noted by sonographer.

Common bile duct:

Diameter: 5 mm

Liver:

No focal lesion identified. Within normal limits in parenchymal
echogenicity. Portal vein is patent on color Doppler imaging with
normal direction of blood flow towards the liver.

Other: None.
IMPRESSION: Unremarkable right upper quadrant ultrasound.

## 2021-07-16 ENCOUNTER — Ambulatory Visit (INDEPENDENT_AMBULATORY_CARE_PROVIDER_SITE_OTHER): Payer: Medicare Other | Admitting: Gastroenterology

## 2021-07-16 ENCOUNTER — Other Ambulatory Visit: Payer: Self-pay

## 2021-07-16 ENCOUNTER — Encounter: Payer: Self-pay | Admitting: Gastroenterology

## 2021-07-16 VITALS — BP 96/56 | HR 94 | Temp 97.3°F | Ht 64.0 in | Wt 79.6 lb

## 2021-07-16 DIAGNOSIS — K5900 Constipation, unspecified: Secondary | ICD-10-CM

## 2021-07-16 DIAGNOSIS — K861 Other chronic pancreatitis: Secondary | ICD-10-CM

## 2021-07-16 DIAGNOSIS — K219 Gastro-esophageal reflux disease without esophagitis: Secondary | ICD-10-CM | POA: Diagnosis not present

## 2021-07-16 MED ORDER — LUBIPROSTONE 24 MCG PO CAPS: ORAL_CAPSULE | ORAL | 3 refills | Status: DC

## 2021-07-16 MED ORDER — PANTOPRAZOLE SODIUM 40 MG PO TBEC: 40.0000 mg | DELAYED_RELEASE_TABLET | Freq: Two times a day (BID) | ORAL | 3 refills | Status: AC

## 2021-07-16 NOTE — Patient Instructions (Signed)
I am glad you are doing better overall!  Continue pantoprazole (Protonix) twice a day, 30 minutes before breakfast and dinner.  Continue Creon with your meals.  I am glad Amitiza is working well for you!  We will see you back in 1 year or sooner if needed!  Have a wonderful birthday!   I enjoyed seeing you again today! As you know, I value our relationship and want to provide genuine, compassionate, and quality care. I welcome your feedback. If you receive a survey regarding your visit,  I greatly appreciate you taking time to fill this out. See you next time!  Annitta Needs, PhD, ANP-BC [facility]

## 2021-07-16 NOTE — Progress Notes (Signed)
Referring Provider: Lemmie Evens, MD Primary Care Physician:  Lemmie Evens, MD Primary GI: Dr. Gala Romney  Chief Complaint  Patient presents with   Constipation    Some better   Abdominal Pain    Right side     HPI:   [name] is a 75 y.o. female presenting today with a history of advanced COPD, chronic pancreatitis, chronic constipation, chronic abdominal pain, and failure to thrive. Weight 75 in May 2021, Aug 2021 was 80. Highest weight 87. History of H.pylori with documented eradication. CT Aug 2018 negative for chronic mesenteric ischemia. HIDA updated due to RUQ pain, which was normal. Updated US unremarkable. She has seen General Surgery with cholecystectomy not recommended. Colonoscopy due in 2023 due to history of adenomas.   Weight stable from last year (79 today, 78 last year) . States she has always weighed 87 lbs. Mom weighed 87 lbs.   Constipation: doing well with Amitiza 24 mcg po BID.   GERD: protonix working well. Better than omeprazole.   RUQ pain: flares 4-5 times per month. At baseline. States she feels this is better than it has been in the past. Declining updated imaging or EGD.   Past Medical History:  Diagnosis Date   Allergic rhinitis    Anxiety    Anxiety and depression    Back pain, chronic    CAD (coronary artery disease)    palpitations, dizziness, chest pain   Cervical cancer (HCC)    cervical   Chronic abdominal pain    with chronic nausea, diarrhea   Collagen vascular disease (HCC)    COPD (chronic obstructive pulmonary disease) (HCC)    Depression    GERD (gastroesophageal reflux disease)    Helicobacter pylori infection 2015   treated with prevpac   Hematochezia    History of kidney stones    Hx of colonic polyps    adenomatous   Hyperlipidemia    Lipid profile on 02/25/2011: 209, 113, 71, 132   Nephrolithiasis    Pancreatitis chronic    Renal calculus    Stroke ([place]) 3-4 yrs ago   left sided weakness   Tobacco abuse     Tubular adenoma 2015   Weight loss    CT negative for occult malignancy, negative celiac, negative adrenal insufficiency    Past Surgical History:  Procedure Laterality Date   ABDOMINAL HYSTERECTOMY     APPENDECTOMY     CATARACT EXTRACTION W/PHACO Right 05/04/2013   Procedure: CATARACT EXTRACTION PHACO AND INTRAOCULAR LENS PLACEMENT ([place]);  Surgeon: Tonny Branch, MD;  Location: [facility];  Service: Ophthalmology;  Laterality: Right;  CDE:16.17   CATARACT EXTRACTION W/PHACO Left 05/22/2013   Procedure: CATARACT EXTRACTION PHACO AND INTRAOCULAR LENS PLACEMENT ([facility]);  Surgeon: Tonny Branch, MD;  Location: [facility];  Service: Ophthalmology;  Laterality: Left;  CDE:14.75   COLONOSCOPY N/A 02/08/2014   Dr.Rourk- normal rectum, colonic polyps bx=tubular adenoma   COLONOSCOPY W/ POLYPECTOMY  2006   WSF:KCLEXNTZGYF, benign gastric nodule, multiple adenomatous polyps, one with tubular morphology   COLONOSCOPY WITH PROPOFOL N/A 04/15/2017   normal   ESOPHAGOGASTRODUODENOSCOPY  2006   VCB:SWHQPR gastric nodule   ESOPHAGOGASTRODUODENOSCOPY N/A 02/08/2014   Dr.Rourk- abnormal stomach and gstric nodule bx= hpylori   EUS  2010   Dr. Ardis Hughs : Multiple shadowing calcifications in pancreas, consistent with Chronic Pancreatitis.  These are mainly confined to a collection of calcifications in head/uncinate pancreas. Otherwise the pancreatic parenchyma appears fairly normal.  The main pancreatic duct, CBD  are both normal without stones, dilation.     Ileocolonoscopy  01/11/2009   RMR: Polyp at the splenic flexure, status post hot snare removal/ Normal rectum, tubular adenoma   PARTIAL HYSTERECTOMY      Current Outpatient Medications  Medication Sig Dispense Refill   albuterol (PROVENTIL HFA;VENTOLIN HFA) 108 (90 Base) MCG/ACT inhaler Inhale 2 puffs into the lungs every 4 (four) hours as needed for wheezing or shortness of breath. 1 Inhaler 0   Calcium Carbonate (CALCIUM 600 PO)      cilostazol (PLETAL) 100 MG  tablet Take 100 mg by mouth 2 (two) times daily.      CREON 24000-76000 units CPEP TAKE 2 CAPSULES WITH EACH MEALS AND 1 CAPSULE WITH SNACKS. MAX 2 SNACKS PER DAY. 240 capsule 5   cyclobenzaprine (FLEXERIL) 5 MG tablet Take 5 mg by mouth at bedtime.     DULoxetine (CYMBALTA) 60 MG capsule Take 60 mg by mouth 2 (two) times daily.      escitalopram (LEXAPRO) 10 MG tablet Take 10 mg by mouth daily.     Fluticasone-Salmeterol (ADVAIR) 250-50 MCG/DOSE AEPB Inhale 1 puff into the lungs as needed.      hydrOXYzine (ATARAX/VISTARIL) 25 MG tablet Take 1 tablet by mouth as needed for anxiety or itching.      LORazepam (ATIVAN) 1 MG tablet Take 1 mg by mouth every 8 (eight) hours as needed for anxiety. Reported on 11/19/2015     lubiprostone (AMITIZA) 24 MCG capsule TAKE 1 CAPSULE BY MOUTH TWICE DAILY WITH A MEAL. 60 capsule 5   Misc Natural Products (SINUS FORMULA) TABS Take 1-2 tablets by mouth daily as needed (for cough and congestion).     nitroGLYCERIN (NITROSTAT) 0.4 MG SL tablet Place 1 tablet (0.4 mg total) under the tongue every 5 (five) minutes as needed. 25 tablet 3   omeprazole (PRILOSEC) 20 MG capsule TAKE 1 CAPSULE BY MOUTH TWICE DAILY. 180 capsule 0   oxyCODONE-acetaminophen (PERCOCET) 10-325 MG tablet Take 1 tablet by mouth as needed.     pantoprazole (PROTONIX) 40 MG tablet Take 1 tablet (40 mg total) by mouth 2 (two) times daily before a meal. 60 tablet 5   potassium chloride SA (K-DUR,KLOR-CON) 20 MEQ tablet Take 20 mEq by mouth daily.     PROLIA 60 MG/ML SOSY injection Inject 60 mg into the skin every 6 (six) months.      promethazine (PHENERGAN) 25 MG tablet Take 25 mg by mouth as needed for nausea or vomiting.      RESTASIS 0.05 % ophthalmic emulsion Place 1 drop into both eyes 2 (two) times daily.     docusate sodium (COLACE) 100 MG capsule Take 100 mg by mouth 2 (two) times daily. (Patient not taking: Reported on 07/16/2021)     oxyCODONE-acetaminophen (PERCOCET) 7.5-325 MG tablet Take  1 tablet by mouth 4 (four) times daily as needed for moderate pain or severe pain.  (Patient not taking: Reported on 07/16/2021)     Vitamin D, Ergocalciferol, (DRISDOL) 50000 units CAPS capsule Take 50,000 Units by mouth every Tuesday.  (Patient not taking: Reported on 07/16/2021)     No current facility-administered medications for this visit.    Allergies as of 07/16/2021 - Review Complete 07/16/2021  Allergen Reaction Noted   Ibuprofen Anaphylaxis and Hives 12/19/2008   Iohexol Hives and Swelling 05/23/2004   Other Itching 11/09/2015   Strawberry (diagnostic) Itching 11/09/2015   Strawberry extract Itching 09/29/2017    Family History  Problem Relation Age  of Onset   Heart attack Father 63   Colon cancer Maternal Grandmother     Social History   Socioeconomic History   Marital status: Divorced    Spouse name: Not on file   Number of children: 3   Years of education: Not on file   Highest education level: Not on file  Occupational History   Occupation: disabled    Employer: UNEMPLOYED  Tobacco Use   Smoking status: Every Day    Packs/day: 0.75    Years: 50.00    Pack years: 37.50    Types: Cigarettes    Start date: 07/18/1962   Smokeless tobacco: Never   Tobacco comments:    3/4 pack daily  Vaping Use   Vaping Use: Never used  Substance and Sexual Activity   Alcohol use: No    Alcohol/week: 0.0 standard drinks    Comment: hx of ETOH about 20 years ago.    Drug use: No   Sexual activity: Never    Birth control/protection: Surgical  Other Topics Concern   Not on file  Social History Narrative   Not on file   Social Determinants of Health   Financial Resource Strain: Not on file  Food Insecurity: Not on file  Transportation Needs: Not on file  Physical Activity: Not on file  Stress: Not on file  Social Connections: Not on file    Review of Systems: Gen: Denies fever, chills, anorexia. Denies fatigue, weakness, weight loss.  CV: Denies chest pain,  palpitations, syncope, peripheral edema, and claudication. Resp: Denies dyspnea at rest, cough, wheezing, coughing up blood, and pleurisy. GI: see hPI Derm: Denies rash, itching, dry skin Psych: Denies depression, anxiety, memory loss, confusion. No homicidal or suicidal ideation.  Heme: Denies bruising, bleeding, and enlarged lymph nodes.  Physical Exam: BP (!) 96/56   Pulse 94   Temp (!) 97.3 F (36.3 C) (Temporal)   Ht 5\' 4"  (1.626 m)   Wt 79 lb 9.6 oz (36.1 kg)   BMI 13.66 kg/m  General:   Alert and oriented. No distress noted. Pleasant and cooperative.  Head:  Normocephalic and atraumatic. Eyes:  Conjuctiva clear without scleral icterus. Mouth:  mask in place Abdomen:  +BS, soft, non-tender and non-distended. No rebound or guarding. No HSM or masses noted. Msk:  Symmetrical without gross deformities. Normal posture. Extremities:  Without edema. Neurologic:  Alert and  oriented x4 Psych:  Alert and cooperative. Normal mood and affect.  ASSESSMENT: ILINE BUCHINGER is a 75 y.o. female presenting today with a history of advanced COPD, chronic pancreatitis, chronic constipation, chronic abdominal pain, and weight loss.  Chronic pancreatitis: continues on Creon. She notes this has been helpful for her.   Weight loss: stable from last year. Her highest weight was 87, which she states dates back to age 77. CTA on file from the past negative for chronic mesenteric ischemia.   RUQ pain: chronic. Actually feels improved from last year. Declining EGD or updated imaging. Appears at baseline if not improved.  GERD: doing better on pantoprazole, changed from omeprazole last year.   Constipation: Amitiza 24 mcg po BID with good response.  PLAN:  Continue Amitiza, Creon, and Protonix Return in 1 year Colonoscopy in 2023 Call if any changes and will see sooner  Annitta Needs, PhD, ANP-BC [facility]

## 2021-10-25 ENCOUNTER — Other Ambulatory Visit: Payer: Self-pay | Admitting: Gastroenterology

## 2022-05-13 ENCOUNTER — Encounter: Payer: Self-pay | Admitting: *Deleted

## 2022-06-15 ENCOUNTER — Other Ambulatory Visit: Payer: Self-pay

## 2022-06-15 ENCOUNTER — Emergency Department (HOSPITAL_COMMUNITY)
Admission: EM | Admit: 2022-06-15 | Discharge: 2022-06-15 | Disposition: A | Discharge status: Home / Self Care | Payer: Medicare Other | Attending: Emergency Medicine | Admitting: Emergency Medicine

## 2022-06-15 ENCOUNTER — Encounter (HOSPITAL_COMMUNITY): Payer: Self-pay | Admitting: Emergency Medicine

## 2022-06-15 DIAGNOSIS — R21 Rash and other nonspecific skin eruption: Secondary | ICD-10-CM | POA: Diagnosis present

## 2022-06-15 MED ORDER — DOXYCYCLINE HYCLATE 100 MG PO CAPS: 100.0000 mg | ORAL_CAPSULE | Freq: Two times a day (BID) | ORAL | 0 refills | Status: DC

## 2022-06-15 MED ORDER — MUPIROCIN CALCIUM 2 % EX CREA: 1.0000 | TOPICAL_CREAM | Freq: Two times a day (BID) | CUTANEOUS | 0 refills | Status: AC

## 2022-06-15 NOTE — ED Provider Notes (Signed)
[facility] EMERGENCY DEPARTMENT Provider Note   CSN: [id number] Arrival date & time: 06/15/22  1517     History  Chief Complaint  Patient presents with   Rash    [name] is a 76 y.o. female.   Rash Associated symptoms: no fever    This patient is a 76 year old female presenting with a complaint of a rash to her nose and her buttock.  She reports that the nose rash is somewhat constant, she uses a topical antibiotic ointment that her family doctor gives her but has run out.  She has also had a small abscess over her tailbone but this has spontaneously ruptured.  She does not have any fevers or chills.    Home Medications Prior to Admission medications   Medication Sig Start Date End Date Taking? Authorizing Provider  doxycycline (VIBRAMYCIN) 100 MG capsule Take 1 capsule (100 mg total) by mouth 2 (two) times daily for 7 days. 06/15/22 06/22/22 Yes Noemi Chapel, MD  mupirocin cream (BACTROBAN) 2 % Apply 1 Application topically 2 (two) times daily. 06/15/22  Yes Noemi Chapel, MD  albuterol (PROVENTIL HFA;VENTOLIN HFA) 108 (90 Base) MCG/ACT inhaler Inhale 2 puffs into the lungs every 4 (four) hours as needed for wheezing or shortness of breath. 02/12/18   Francine Graven, DO  Calcium Carbonate (CALCIUM 600 PO)     [provider]  cilostazol (PLETAL) 100 MG tablet Take 100 mg by mouth 2 (two) times daily.  03/29/15   [provider]  cyclobenzaprine (FLEXERIL) 5 MG tablet Take 5 mg by mouth at bedtime. 05/31/18   [provider]  DULoxetine (CYMBALTA) 60 MG capsule Take 60 mg by mouth 2 (two) times daily.     [provider]  escitalopram (LEXAPRO) 10 MG tablet Take 10 mg by mouth daily. 01/30/17   [provider]  Fluticasone-Salmeterol (ADVAIR) 250-50 MCG/DOSE AEPB Inhale 1 puff into the lungs as needed.     [provider]  hydrOXYzine (ATARAX/VISTARIL) 25 MG tablet Take 1 tablet by mouth as needed for anxiety or itching.   04/21/16   [provider]  LORazepam (ATIVAN) 1 MG tablet Take 1 mg by mouth every 8 (eight) hours as needed for anxiety. Reported on 11/19/2015    [provider]  lubiprostone (AMITIZA) 24 MCG capsule TAKE 1 CAPSULE BY MOUTH TWICE DAILY WITH A MEAL. 07/16/21   Annitta Needs, NP  Misc Natural Products (SINUS FORMULA) TABS Take 1-2 tablets by mouth daily as needed (for cough and congestion).    [provider]  nitroGLYCERIN (NITROSTAT) 0.4 MG SL tablet Place 1 tablet (0.4 mg total) under the tongue every 5 (five) minutes as needed. 11/21/14   Herminio Commons, MD  oxyCODONE-acetaminophen (PERCOCET) 10-325 MG tablet Take 1 tablet by mouth as needed. 12/27/20   [provider]  Pancrelipase, Lip-Prot-Amyl, (CREON) 24000-76000 units CPEP Take 2 capsules with meals three times daily. Take 1 capsule with snacks. 8 total capsules per day. 10/27/21   Mahala Menghini, PA-C  pantoprazole (PROTONIX) 40 MG tablet Take 1 tablet (40 mg total) by mouth 2 (two) times daily before a meal. 07/16/21   Annitta Needs, NP  potassium chloride SA (K-DUR,KLOR-CON) 20 MEQ tablet Take 20 mEq by mouth daily.    [provider]  PROLIA 60 MG/ML SOSY injection Inject 60 mg into the skin every 6 (six) months.  05/31/18   [provider]  promethazine (PHENERGAN) 25 MG tablet Take 25 mg  by mouth as needed for nausea or vomiting.     [provider]  RESTASIS 0.05 % ophthalmic emulsion Place 1 drop into both eyes 2 (two) times daily. 03/27/16   [provider]      Allergies    Ibuprofen, Iohexol, Other, Strawberry (diagnostic), and Strawberry extract    Review of Systems   Review of Systems  Constitutional:  Negative for fever.  Skin:  Positive for rash.    Physical Exam Updated Vital Signs BP 134/87 (BP Location: Right Arm)   Pulse 85   Temp 98.3 F (36.8 C) (Oral)   Resp 16   Ht 1.626 m ('5\' 4"'$ )   Wt 36.3 kg   SpO2 91%   BMI 13.73 kg/m   Physical Exam Vitals and nursing note reviewed.  Constitutional:      Appearance: She is well-developed. She is not diaphoretic.  HENT:     Head: Normocephalic and atraumatic.  Eyes:     General:        Right eye: No discharge.        Left eye: No discharge.     Conjunctiva/sclera: Conjunctivae normal.  Pulmonary:     Effort: Pulmonary effort is normal. No respiratory distress.  Musculoskeletal:     Right lower leg: No edema.     Left lower leg: No edema.  Skin:    General: Skin is warm and dry.     Findings: No erythema or rash.     Comments: Small excoriated papular rash across the nose and cheeks, 1 cm in diameter shallow ulceration over buttock from what appears to be a ruptured abscess.  There is no surrounding induration or fluctuance or drainage or foul smell.  Neurological:     Mental Status: She is alert.     Coordination: Coordination normal.     ED Results / Procedures / Treatments   Labs (all labs ordered are listed, but only abnormal results are displayed) Labs Reviewed - No data to display  EKG None  Radiology No results found.  Procedures Procedures    Medications Ordered in ED Medications - No data to display  ED Course/ Medical Decision Making/ A&P                           Medical Decision Making Risk Prescription drug management.   Patient is ambulatory, she has what appears to be a ruptured abscess over the pilonidal area which is completely flat and nontender.  This is drained spontaneously.  The rash on her face can be treated with mupirocin.  Otherwise this patient is in no distress and can be discharged home safely.  Vitals reviewed and normal.  Patient agreeable          Final Clinical Impression(s) / ED Diagnoses Final diagnoses:  Facial rash    Rx / DC Orders ED Discharge Orders          Ordered    mupirocin cream (BACTROBAN) 2 %  2 times daily        06/15/22 1643    doxycycline (VIBRAMYCIN) 100 MG capsule  2 times  daily        06/15/22 1643              Noemi Chapel, MD 06/15/22 1645

## 2022-06-15 NOTE — Discharge Instructions (Signed)
Please apply mupirocin twice a day to the wounds on your nose as well as your buttock.  Doxycycline is an antibiotic which is taken twice a day, this treats bacterial infections that can cause staph infections, it treats sinus infections and some pneumonia.  In this case I would like for you to take the antibiotic exactly as prescribed until it is completed.  Please be aware that occasionally people will get a rash if they are in the sunlight for extended periods of time while taking this medicine.  Thank you for allowing [place] to treat you in the emergency department today.  After reviewing your examination and potential testing that was done it appears that you are safe to go home.  I would like for you to follow-up with your doctor within the next several days, have them obtain your results and follow-up with them to review all of these tests.  If you should develop severe or worsening symptoms return to the emergency department immediately  Please stop smoking as this will help you to heal

## 2022-06-15 NOTE — ED Triage Notes (Signed)
Pt states she has a rash on her face and a coil on her buttocks.

## 2022-06-17 ENCOUNTER — Encounter: Payer: Self-pay | Admitting: *Deleted

## 2022-06-21 ENCOUNTER — Other Ambulatory Visit: Payer: Self-pay

## 2022-06-21 ENCOUNTER — Emergency Department (HOSPITAL_COMMUNITY): Payer: Medicare Other

## 2022-06-21 ENCOUNTER — Inpatient Hospital Stay (HOSPITAL_COMMUNITY)
Admission: EM | Admit: 2022-06-21 | Discharge: 2022-06-26 | DRG: 438 | Disposition: A | Discharge status: Home / Self Care | Payer: Medicare Other | Attending: Internal Medicine | Admitting: Internal Medicine

## 2022-06-21 ENCOUNTER — Encounter (HOSPITAL_COMMUNITY): Payer: Self-pay

## 2022-06-21 DIAGNOSIS — J449 Chronic obstructive pulmonary disease, unspecified: Secondary | ICD-10-CM | POA: Diagnosis present

## 2022-06-21 DIAGNOSIS — K5900 Constipation, unspecified: Secondary | ICD-10-CM | POA: Diagnosis present

## 2022-06-21 DIAGNOSIS — G8929 Other chronic pain: Secondary | ICD-10-CM | POA: Diagnosis present

## 2022-06-21 DIAGNOSIS — Z8541 Personal history of malignant neoplasm of cervix uteri: Secondary | ICD-10-CM

## 2022-06-21 DIAGNOSIS — J9811 Atelectasis: Secondary | ICD-10-CM | POA: Diagnosis present

## 2022-06-21 DIAGNOSIS — K859 Acute pancreatitis without necrosis or infection, unspecified: Secondary | ICD-10-CM | POA: Diagnosis not present

## 2022-06-21 DIAGNOSIS — Z681 Body mass index (BMI) 19 or less, adult: Secondary | ICD-10-CM | POA: Diagnosis not present

## 2022-06-21 DIAGNOSIS — I700 Atherosclerosis of aorta: Secondary | ICD-10-CM | POA: Diagnosis present

## 2022-06-21 DIAGNOSIS — Z800 Family history of malignant neoplasm of digestive organs: Secondary | ICD-10-CM | POA: Diagnosis not present

## 2022-06-21 DIAGNOSIS — R627 Adult failure to thrive: Secondary | ICD-10-CM | POA: Diagnosis present

## 2022-06-21 DIAGNOSIS — E876 Hypokalemia: Secondary | ICD-10-CM | POA: Diagnosis present

## 2022-06-21 DIAGNOSIS — E43 Unspecified severe protein-calorie malnutrition: Secondary | ICD-10-CM | POA: Insufficient documentation

## 2022-06-21 DIAGNOSIS — E785 Hyperlipidemia, unspecified: Secondary | ICD-10-CM | POA: Diagnosis present

## 2022-06-21 DIAGNOSIS — E860 Dehydration: Secondary | ICD-10-CM | POA: Diagnosis present

## 2022-06-21 DIAGNOSIS — F17210 Nicotine dependence, cigarettes, uncomplicated: Secondary | ICD-10-CM | POA: Diagnosis present

## 2022-06-21 DIAGNOSIS — I441 Atrioventricular block, second degree: Secondary | ICD-10-CM

## 2022-06-21 DIAGNOSIS — Z720 Tobacco use: Secondary | ICD-10-CM

## 2022-06-21 DIAGNOSIS — R112 Nausea with vomiting, unspecified: Secondary | ICD-10-CM

## 2022-06-21 DIAGNOSIS — Z79899 Other long term (current) drug therapy: Secondary | ICD-10-CM

## 2022-06-21 DIAGNOSIS — Z8249 Family history of ischemic heart disease and other diseases of the circulatory system: Secondary | ICD-10-CM | POA: Diagnosis not present

## 2022-06-21 DIAGNOSIS — R9431 Abnormal electrocardiogram [ECG] [EKG]: Secondary | ICD-10-CM

## 2022-06-21 DIAGNOSIS — K861 Other chronic pancreatitis: Secondary | ICD-10-CM | POA: Diagnosis present

## 2022-06-21 DIAGNOSIS — D72829 Elevated white blood cell count, unspecified: Secondary | ICD-10-CM | POA: Diagnosis not present

## 2022-06-21 DIAGNOSIS — R109 Unspecified abdominal pain: Secondary | ICD-10-CM | POA: Diagnosis present

## 2022-06-21 DIAGNOSIS — R1010 Upper abdominal pain, unspecified: Secondary | ICD-10-CM

## 2022-06-21 DIAGNOSIS — D75839 Thrombocytosis, unspecified: Secondary | ICD-10-CM

## 2022-06-21 DIAGNOSIS — K219 Gastro-esophageal reflux disease without esophagitis: Secondary | ICD-10-CM

## 2022-06-21 DIAGNOSIS — I2510 Atherosclerotic heart disease of native coronary artery without angina pectoris: Secondary | ICD-10-CM | POA: Diagnosis present

## 2022-06-21 DIAGNOSIS — E162 Hypoglycemia, unspecified: Secondary | ICD-10-CM | POA: Diagnosis present

## 2022-06-21 DIAGNOSIS — J9601 Acute respiratory failure with hypoxia: Secondary | ICD-10-CM | POA: Diagnosis present

## 2022-06-21 DIAGNOSIS — Z8673 Personal history of transient ischemic attack (TIA), and cerebral infarction without residual deficits: Secondary | ICD-10-CM

## 2022-06-21 DIAGNOSIS — Z90710 Acquired absence of both cervix and uterus: Secondary | ICD-10-CM

## 2022-06-21 LAB — COMPREHENSIVE METABOLIC PANEL
ALT: 8 U/L (ref 0–44)
AST: 15 U/L (ref 15–41)
Albumin: 3.5 g/dL (ref 3.5–5.0)
Alkaline Phosphatase: 54 U/L (ref 38–126)
Anion gap: 13 (ref 5–15)
BUN: 23 mg/dL (ref 8–23)
CO2: 23 mmol/L (ref 22–32)
Calcium: 8.8 mg/dL — ABNORMAL LOW (ref 8.9–10.3)
Chloride: 102 mmol/L (ref 98–111)
Creatinine, Ser: 0.54 mg/dL (ref 0.44–1.00)
GFR, Estimated: >60 mL/min (ref >60)
Glucose, Bld: 130 mg/dL — ABNORMAL HIGH (ref 70–99)
Potassium: 2.9 mmol/L — ABNORMAL LOW (ref 3.5–5.1)
Sodium: 138 mmol/L (ref 135–145)
Total Bilirubin: 0.7 mg/dL (ref 0.3–1.2)
Total Protein: 6.3 g/dL — ABNORMAL LOW (ref 6.5–8.1)

## 2022-06-21 LAB — CBC WITH DIFFERENTIAL/PLATELET
Abs Immature Granulocytes: 0.15 10*3/uL — ABNORMAL HIGH (ref 0.00–0.07)
Basophils Absolute: 0 10*3/uL (ref 0.0–0.1)
Basophils Relative: 0 %
Eosinophils Absolute: 0.5 10*3/uL (ref 0.0–0.5)
Eosinophils Relative: 3 %
HCT: 37.1 % (ref 36.0–46.0)
Hemoglobin: 12.8 g/dL (ref 12.0–15.0)
Immature Granulocytes: 1 %
Lymphocytes Relative: 7 %
Lymphs Abs: 1.4 10*3/uL (ref 0.7–4.0)
MCH: 30.6 pg (ref 26.0–34.0)
MCHC: 34.5 g/dL (ref 30.0–36.0)
MCV: 88.8 fL (ref 80.0–100.0)
Monocytes Absolute: 1.4 10*3/uL — ABNORMAL HIGH (ref 0.1–1.0)
Monocytes Relative: 7 %
Neutro Abs: 15.3 10*3/uL — ABNORMAL HIGH (ref 1.7–7.7)
Neutrophils Relative %: 82 %
Platelets: 539 10*3/uL — ABNORMAL HIGH (ref 150–400)
RBC: 4.18 MIL/uL (ref 3.87–5.11)
RDW: 13.2 % (ref 11.5–15.5)
WBC: 18.8 10*3/uL — ABNORMAL HIGH (ref 4.0–10.5)
nRBC: 0 % (ref 0.0–0.2)

## 2022-06-21 LAB — PHOSPHORUS: Phosphorus: 3 mg/dL (ref 2.5–4.6)

## 2022-06-21 LAB — MAGNESIUM: Magnesium: 1.5 mg/dL — ABNORMAL LOW (ref 1.7–2.4)

## 2022-06-21 LAB — LIPASE, BLOOD: Lipase: 1142 U/L — ABNORMAL HIGH (ref 11–51)

## 2022-06-21 MED ORDER — SODIUM CHLORIDE 0.9 % IV BOLUS
1000.0000 mL | Freq: Once | INTRAVENOUS | Status: AC
  Administered 2022-06-21: 1000 mL via INTRAVENOUS

## 2022-06-21 MED ORDER — PROCHLORPERAZINE EDISYLATE 10 MG/2ML IJ SOLN
10.0000 mg | Freq: Four times a day (QID) | INTRAMUSCULAR | Status: DC | PRN
  Administered 2022-06-22 – 2022-06-23 (×2): 10 mg via INTRAVENOUS
  Filled 2022-06-21 (×2): qty 2

## 2022-06-21 MED ORDER — PANCRELIPASE (LIP-PROT-AMYL) 12000-38000 UNITS PO CPEP: 24000.0000 [IU] | ORAL_CAPSULE | Freq: Three times a day (TID) | ORAL | Status: DC

## 2022-06-21 MED ORDER — FENTANYL CITRATE PF 50 MCG/ML IJ SOSY
12.5000 ug | PREFILLED_SYRINGE | Freq: Once | INTRAMUSCULAR | Status: AC
  Administered 2022-06-21: 12.5 ug via INTRAVENOUS
  Filled 2022-06-21: qty 1

## 2022-06-21 MED ORDER — ONDANSETRON HCL 4 MG/2ML IJ SOLN
4.0000 mg | Freq: Four times a day (QID) | INTRAMUSCULAR | Status: DC | PRN
  Administered 2022-06-21 – 2022-06-24 (×6): 4 mg via INTRAVENOUS
  Filled 2022-06-21 (×7): qty 2

## 2022-06-21 MED ORDER — MAGNESIUM SULFATE 2 GM/50ML IV SOLN
2.0000 g | Freq: Once | INTRAVENOUS | Status: AC
  Administered 2022-06-21: 2 g via INTRAVENOUS
  Filled 2022-06-21: qty 50

## 2022-06-21 MED ORDER — ENOXAPARIN SODIUM 30 MG/0.3ML IJ SOSY
30.0000 mg | PREFILLED_SYRINGE | INTRAMUSCULAR | Status: DC
  Administered 2022-06-21 – 2022-06-25 (×5): 30 mg via SUBCUTANEOUS
  Filled 2022-06-21 (×5): qty 0.3

## 2022-06-21 MED ORDER — HYDROMORPHONE HCL 1 MG/ML IJ SOLN
0.5000 mg | Freq: Once | INTRAMUSCULAR | Status: AC
  Administered 2022-06-21: 0.5 mg via INTRAVENOUS
  Filled 2022-06-21: qty 0.5

## 2022-06-21 MED ORDER — LACTATED RINGERS IV SOLN: INTRAVENOUS | Status: DC

## 2022-06-21 MED ORDER — ENSURE ENLIVE PO LIQD
237.0000 mL | Freq: Two times a day (BID) | ORAL | Status: DC
  Administered 2022-06-25 – 2022-06-26 (×3): 237 mL via ORAL
  Filled 2022-06-21 (×2): qty 237

## 2022-06-21 MED ORDER — ONDANSETRON HCL 4 MG/2ML IJ SOLN
4.0000 mg | Freq: Once | INTRAMUSCULAR | Status: AC
  Administered 2022-06-21: 4 mg via INTRAVENOUS
  Filled 2022-06-21: qty 2

## 2022-06-21 MED ORDER — PANCRELIPASE (LIP-PROT-AMYL) 12000-38000 UNITS PO CPEP
48000.0000 [IU] | ORAL_CAPSULE | Freq: Three times a day (TID) | ORAL | Status: DC
  Administered 2022-06-24: 48000 [IU] via ORAL
  Filled 2022-06-21 (×3): qty 4

## 2022-06-21 MED ORDER — PANTOPRAZOLE SODIUM 40 MG IV SOLR
40.0000 mg | Freq: Two times a day (BID) | INTRAVENOUS | Status: DC
  Administered 2022-06-21 – 2022-06-26 (×10): 40 mg via INTRAVENOUS
  Filled 2022-06-21 (×10): qty 10

## 2022-06-21 MED ORDER — POTASSIUM CHLORIDE 10 MEQ/100ML IV SOLN
10.0000 meq | INTRAVENOUS | Status: AC
  Administered 2022-06-21 (×4): 10 meq via INTRAVENOUS
  Filled 2022-06-21 (×4): qty 100

## 2022-06-21 MED ORDER — ONDANSETRON HCL 4 MG PO TABS: 4.0000 mg | ORAL_TABLET | Freq: Four times a day (QID) | ORAL | Status: DC | PRN

## 2022-06-21 MED ORDER — HYDROMORPHONE HCL 1 MG/ML IJ SOLN
0.5000 mg | INTRAMUSCULAR | Status: DC | PRN
  Administered 2022-06-21 – 2022-06-25 (×19): 0.5 mg via INTRAVENOUS
  Filled 2022-06-21 (×19): qty 0.5

## 2022-06-21 NOTE — ED Provider Notes (Signed)
Patient seen in conjunction with Small PA-C.  Briefly, patient presents for persistent vomiting and abdominal pain.  She has a history of chronic pancreatitis.   Plan: Labs, CT   4:51 PM Reassessment performed. Patient appears uncomfortable.  Labs and imaging personally reviewed and interpreted including: CBC with leukocytosis white blood cell count 18.8, hemoglobin 12.8; CMP with hypokalemia 2.9, kidney function 0.54 however patient is malnourished on exam; lipase markedly elevated at 1142; magnesium low at 1.5; phosphorus 3.0.  EKG with type II heart block.   Reviewed additional pertinent lab work and imaging with patient at bedside including: Pending CT, elevated lipase and electrolyte disturbance.   Most current vital signs reviewed and are as follows: BP 134/74   Pulse 67   Temp (!) 97 F (36.1 C) (Axillary)   Resp 16   Ht '5\' 4"'$  (1.626 m)   Wt 36.3 kg   SpO2 99%   BMI 13.73 kg/m   Plan: Imaging, with symptom control, admission.   Exam:  Gen appears uncomfortable but not in distress, cachectic; Heart RRR, nml S1,S2, no m/r/g; Lungs CTAB; Abd soft, generalized tenderness, no rebound or guarding; Ext 2+ pedal pulses bilaterally, no edema.     Carlisle Cater, PA-C 06/21/22 1653    Long, [facility], MD 06/22/22 2007

## 2022-06-21 NOTE — ED Notes (Signed)
Found a flea on abdomen of pt. States having "one dog."

## 2022-06-21 NOTE — H&P (Signed)
History and Physical    Patient: [name]:[id number] DOB: Dec 03, 1945 DOA: 06/21/2022 DOS: the patient was seen and examined on 06/21/2022 PCP: Lemmie Evens, MD  Patient coming from: Home  Chief Complaint:  Chief Complaint  Patient presents with   Abdominal Pain   HPI: [name] is a 76 y.o. female with medical history significant of chronic abdominal pain (RUQ) and chronic pancreatitis with calcifications of the pancreatic head, COPD, weight loss, constipation, GERD, failure to thrive and tobacco use who presents to the emergency department due to abdominal pain which started this morning.  Pain was sharp in nature and was epigastric with radiation to right upper quadrant and to the back, it was rated as 10/10 on pain scale and was associated with nausea and several episodes ( > 10) of nonbloody vomiting with difficulty in being able to tolerate any oral intake.  There was no known alleviating/aggravating factors.  Last pancreatitis flareup was about a month ago, but this self resolved without needing to go to the hospital.  She denies alcohol consumption, chest pain, shortness of breath, fever, chills, headache.  ED Course:  In the emergency department, temperature on arrival was 8F and all other vital signs were within normal range.  Work-up in the ED showed leukocytosis and thrombocytosis with normal hemoglobin and hematocrit.  BMP was normal except for hypokalemia and blood glucose of 130.  Lipase 1,142, phosphorus 3.0, magnesium 1.5. CT abdomen and pelvis without contrast showed: Inflammation/stranding around the pancreas. Enlargement pancreatic sq. Findings most compatible with acute pancreatitis. Adjacent inflammation surrounding the duodenum.   Calcifications in the pancreatic head compatible chronic pancreatitis.   Small amount of free fluid in the abdomen and pelvis.   Aortic atherosclerosis.  IV fentanyl and Dilaudid were given due to pain.  Magnesium and  potassium were replenished, Zofran was given due to nausea and vomiting and IV hydration was provided.  Hospitalist was asked to admit patient for further evaluation and management.   Review of Systems: Review of systems as noted in the HPI. All other systems reviewed and are negative.   Past Medical History:  Diagnosis Date   Allergic rhinitis    Anxiety    Anxiety and depression    Back pain, chronic    CAD (coronary artery disease)    palpitations, dizziness, chest pain   Cervical cancer (HCC)    cervical   Chronic abdominal pain    with chronic nausea, diarrhea   Collagen vascular disease (HCC)    COPD (chronic obstructive pulmonary disease) (HCC)    Depression    GERD (gastroesophageal reflux disease)    Helicobacter pylori infection 2015   treated with prevpac   Hematochezia    History of kidney stones    Hx of colonic polyps    adenomatous   Hyperlipidemia    Lipid profile on 02/25/2011: 209, 113, 22, 132   Nephrolithiasis    Pancreatitis chronic    Renal calculus    Stroke ([place]) 3-4 yrs ago   left sided weakness   Tobacco abuse    Tubular adenoma 2015   Weight loss    CT negative for occult malignancy, negative celiac, negative adrenal insufficiency   Past Surgical History:  Procedure Laterality Date   ABDOMINAL HYSTERECTOMY     APPENDECTOMY     CATARACT EXTRACTION W/PHACO Right 05/04/2013   Procedure: CATARACT EXTRACTION PHACO AND INTRAOCULAR LENS PLACEMENT ([place]);  Surgeon: Tonny Branch, MD;  Location: [facility];  Service: Ophthalmology;  Laterality: Right;  CDE:16.17   CATARACT EXTRACTION W/PHACO Left 05/22/2013   Procedure: CATARACT EXTRACTION PHACO AND INTRAOCULAR LENS PLACEMENT ([facility]);  Surgeon: Tonny Branch, MD;  Location: [facility];  Service: Ophthalmology;  Laterality: Left;  CDE:14.75   COLONOSCOPY N/A 02/08/2014   Dr.Rourk- normal rectum, colonic polyps bx=tubular adenoma   COLONOSCOPY W/ POLYPECTOMY  2006   UEA:[id number], benign gastric nodule, multiple  adenomatous polyps, one with tubular morphology   COLONOSCOPY WITH PROPOFOL N/A 04/15/2017   normal   ESOPHAGOGASTRODUODENOSCOPY  2006   WGN:FAOZHY gastric nodule   ESOPHAGOGASTRODUODENOSCOPY N/A 02/08/2014   Dr.Rourk- abnormal stomach and gstric nodule bx= hpylori   EUS  2010   Dr. Ardis Hughs : Multiple shadowing calcifications in pancreas, consistent with Chronic Pancreatitis.  These are mainly confined to a collection of calcifications in head/uncinate pancreas. Otherwise the pancreatic parenchyma appears fairly normal.  The main pancreatic duct, CBD are both normal without stones, dilation.     Ileocolonoscopy  01/11/2009   RMR: Polyp at the splenic flexure, status post hot snare removal/ Normal rectum, tubular adenoma   PARTIAL HYSTERECTOMY      Social History:  reports that she has been smoking cigarettes. She started smoking about 59 years ago. She has a 37.50 pack-year smoking history. She has never used smokeless tobacco. She reports that she does not drink alcohol and does not use drugs.   Allergies  Allergen Reactions   Ibuprofen Anaphylaxis and Hives   Iohexol Hives and Swelling    HIVES AND SWELLING WITH I.V.P DYE, NEEDS PRE-MEDS.    Other Itching   Strawberry (Diagnostic) Itching   Strawberry Extract Itching    Family History  Problem Relation Age of Onset   Heart attack Father 2   Colon cancer Maternal Grandmother      Prior to Admission medications   Medication Sig Start Date End Date Taking? Authorizing Provider  albuterol (PROVENTIL HFA;VENTOLIN HFA) 108 (90 Base) MCG/ACT inhaler Inhale 2 puffs into the lungs every 4 (four) hours as needed for wheezing or shortness of breath. 02/12/18  Yes Francine Graven, DO  Calcium Carbonate (CALCIUM 600 PO)    Yes [provider]  cilostazol (PLETAL) 100 MG tablet Take 100 mg by mouth 2 (two) times daily.  03/29/15  Yes [provider]  cyclobenzaprine (FLEXERIL) 5 MG tablet Take 5 mg by mouth at bedtime.  05/31/18  Yes [provider]  doxycycline (VIBRAMYCIN) 100 MG capsule Take 1 capsule (100 mg total) by mouth 2 (two) times daily for 7 days. 06/15/22 06/22/22 Yes Noemi Chapel, MD  DULoxetine (CYMBALTA) 60 MG capsule Take 60 mg by mouth 2 (two) times daily.    Yes [provider]  escitalopram (LEXAPRO) 10 MG tablet Take 10 mg by mouth daily. 01/30/17  Yes [provider]  Fluticasone-Salmeterol (ADVAIR) 250-50 MCG/DOSE AEPB Inhale 1 puff into the lungs as needed.    Yes [provider]  hydrOXYzine (ATARAX/VISTARIL) 25 MG tablet Take 1 tablet by mouth as needed for anxiety or itching.  04/21/16  Yes [provider]  LORazepam (ATIVAN) 1 MG tablet Take 1 mg by mouth every 8 (eight) hours as needed for anxiety. Reported on 11/19/2015   Yes [provider]  lubiprostone (AMITIZA) 24 MCG capsule TAKE 1 CAPSULE BY MOUTH TWICE DAILY WITH A MEAL. 07/16/21  Yes Annitta Needs, NP  mupirocin cream (BACTROBAN) 2 % Apply 1 Application topically 2 (two) times daily. 06/15/22  Yes Noemi Chapel, MD  oxyCODONE-acetaminophen (PERCOCET) 10-325 MG  tablet Take 1 tablet by mouth as needed. 12/27/20  Yes [provider]  Pancrelipase, Lip-Prot-Amyl, (CREON) 24000-76000 units CPEP Take 2 capsules with meals three times daily. Take 1 capsule with snacks. 8 total capsules per day. 10/27/21  Yes Mahala Menghini, PA-C  pantoprazole (PROTONIX) 40 MG tablet Take 1 tablet (40 mg total) by mouth 2 (two) times daily before a meal. 07/16/21  Yes Annitta Needs, NP  potassium chloride SA (K-DUR,KLOR-CON) 20 MEQ tablet Take 20 mEq by mouth daily.   Yes [provider]  PROLIA 60 MG/ML SOSY injection Inject 60 mg into the skin every 6 (six) months.  05/31/18  Yes [provider]  promethazine (PHENERGAN) 25 MG tablet Take 25 mg by mouth as needed for nausea or vomiting.    Yes [provider]  zolpidem (AMBIEN) 10 MG tablet Take 10 mg by mouth at bedtime.  06/12/22  Yes [provider]    Physical Exam: BP (!) 149/80   Pulse 82   Temp 97.6 F (36.4 C) (Oral)   Resp (!) 9   Ht '5\' 4"'$  (1.626 m)   Wt 36.3 kg   SpO2 96%   BMI 13.73 kg/m   General: 76 y.o. year-old female cachectic, ill-appearing, but in no acute distress.  Alert and oriented x3. HEENT: NCAT, EOMI, dry mucous membrane Neck: Supple, trachea medial Cardiovascular: Regular rate and rhythm with no rubs or gallops.  No thyromegaly or JVD noted.  No lower extremity edema. 2/4 pulses in all 4 extremities. Respiratory: Clear to auscultation with no wheezes or rales. Good inspiratory effort. Abdomen: Soft, tender to palpation of epigastric and right upper quadrant without guarding.  Nondistended with normal bowel sounds x4 quadrants. Muskuloskeletal: No cyanosis, clubbing or edema noted bilaterally Neuro: CN II-XII intact, strength 5/5 x 4, sensation, reflexes intact Skin: No ulcerative lesions noted or rashes, warm and dry Psychiatry: Judgement and insight appear normal. Mood is appropriate for condition and setting          Labs on Admission:  Basic Metabolic Panel: Recent Labs  Lab 06/21/22 1438 06/21/22 1442  NA 138  --   K 2.9*  --   CL 102  --   CO2 23  --   GLUCOSE 130*  --   BUN 23  --   CREATININE 0.54  --   CALCIUM 8.8*  --   MG  --  1.5*  PHOS  --  3.0   Liver Function Tests: Recent Labs  Lab 06/21/22 1438  AST 15  ALT 8  ALKPHOS 54  BILITOT 0.7  PROT 6.3*  ALBUMIN 3.5   Recent Labs  Lab 06/21/22 1438  LIPASE 1,142*   No results for input(s): "AMMONIA" in the last 168 hours. CBC: Recent Labs  Lab 06/21/22 1438  WBC 18.8*  NEUTROABS 15.3*  HGB 12.8  HCT 37.1  MCV 88.8  PLT 539*   Cardiac Enzymes: No results for input(s): "CKTOTAL", "CKMB", "CKMBINDEX", "TROPONINI" in the last 168 hours.  BNP (last 3 results) No results for input(s): "BNP" in the last 8760 hours.  ProBNP (last 3 results) No results for input(s): "PROBNP"  in the last 8760 hours.  CBG: No results for input(s): "GLUCAP" in the last 168 hours.  Radiological Exams on Admission: CT ABDOMEN PELVIS WO CONTRAST  Result Date: 06/21/2022 CLINICAL DATA:  Abdominal pain EXAM: CT ABDOMEN AND PELVIS WITHOUT CONTRAST TECHNIQUE: Multidetector CT imaging of the abdomen and pelvis was performed following the standard protocol  without IV contrast. RADIATION DOSE REDUCTION: This exam was performed according to the departmental dose-optimization program which includes automated exposure control, adjustment of the mA and/or kV according to patient size and/or use of iterative reconstruction technique. COMPARISON:  04/08/2019 FINDINGS: Lower chest: Emphysematous changes.  No acute abnormality. Hepatobiliary: No focal hepatic abnormality. Gallbladder unremarkable. Pancreas: There is stranding around the pancreas. Pancreatic head appears prominent. Findings suggestive of acute pancreatitis. Calcifications in the pancreatic head compatible chronic pancreatitis. No ductal dilatation. Spleen: No focal abnormality.  Normal size. Adrenals/Urinary Tract: No adrenal abnormality. No focal renal abnormality. No stones or hydronephrosis. Urinary bladder is unremarkable. Stomach/Bowel: Inflammation/stranding around the duodenum likely related to pancreatitis. Mild gaseous distention of the transverse colon might reflect focal ileus. Moderate stool burden in the colon. No evidence of bowel obstruction. Vascular/Lymphatic: Aortic atherosclerosis. No evidence of aneurysm or adenopathy. Reproductive: Prior hysterectomy.  No adnexal masses. Other: Small amount of free fluid in the abdomen and pelvis. Musculoskeletal: No acute bony abnormality. IMPRESSION: Inflammation/stranding around the pancreas. Enlargement pancreatic sq. Findings most compatible with acute pancreatitis. Adjacent inflammation surrounding the duodenum. Calcifications in the pancreatic head compatible chronic pancreatitis. Small  amount of free fluid in the abdomen and pelvis. Aortic atherosclerosis. Electronically Signed   By: Rolm Baptise M.D.   On: 06/21/2022 18:35    EKG: I independently viewed the EKG done and my findings are as followed: Normal sinus rhythm at a rate of 89 bpm with prolonged QTc 544 ms  Assessment/Plan Present on Admission:  Acute on chronic pancreatitis ([place])  Abdominal pain  Constipation  GERD  Tobacco abuse  Principal Problem:   Acute on chronic pancreatitis ([place]) Active Problems:   Tobacco abuse   COPD (chronic obstructive pulmonary disease) (HCC)   GERD   Abdominal pain   Nausea & vomiting   Constipation   Failure to thrive in adult   Hypokalemia   Hypomagnesemia   Leukocytosis   Thrombocytosis   Prolonged QT interval   Abdominal pain, nausea and vomiting secondary to acute on chronic pancreatitis CT abdomen pelvis without contrast was suggestive of acute pancreatitis Elevated lipase BISAP Score = 1 point Continue IV Compazine p.r.n Continue IV Dilaudid p.r.n for pain Continue Protonix Continue IV LR at 50 ml/Hr Continue full liquid diet with plan to advance diet as tolerated Continue Creon RUQ U/S in the morning to investigate biliary etiology (gallstone and bile duct dilatation)  Dehydration Continue IV hydration  Hypokalemia K+ is 2.9 K+ will be replenished Please monitor for AM K+ for further replenishmemnt  Hypomagnesemia Mg level is 1.5 This will be replenished Please continue to monitor Mg level and correct accordingly  Prolonged QT interval QTc 544 ms Avoid QT prolonging drugs Magnesium and potassium will be replenished Repeat EKG in the morning  Leukocytosis possibly reactive WBC 18.8, this may be due to stress demargination Continue to monitor WBC with morning labs  Thrombocytosis possibly reactive Platelets 539, this may be due to stress demargination Continue to monitor platelet level with morning labs  GERD Continue  Protonix  Constipation Continue Amitiza  COPD Continue ventilating, Dulera  Failure to thrive in adult (BMI 13.73) Protein supplement to be provided Dietitian will be consulted and we shall await further recommendation  Tobacco abuse Patient was counseled on tobacco abuse cessation  DVT prophylaxis: Lovenox  Code Status: Full code  Consults: Dietitian  Family Communication: None at bedside  Severity of Illness: The appropriate patient status for this patient is INPATIENT. Inpatient status is judged to  be reasonable and necessary in order to provide the required intensity of service to ensure the patient's safety. The patient's presenting symptoms, physical exam findings, and initial radiographic and laboratory data in the context of their chronic comorbidities is felt to place them at high risk for further clinical deterioration. Furthermore, it is not anticipated that the patient will be medically stable for discharge from the hospital within 2 midnights of admission.   * I certify that at the point of admission it is my clinical judgment that the patient will require inpatient hospital care spanning beyond 2 midnights from the point of admission due to high intensity of service, high risk for further deterioration and high frequency of surveillance required.*  Author: Bernadette Hoit, DO 06/21/2022 9:27 PM  For on call review [email or web address].

## 2022-06-21 NOTE — ED Triage Notes (Signed)
[facility] reports pt coming from home c/o RUQ pain that radiates to her right flank and back. Pt has had N/V with it, hx of pancreatitis.

## 2022-06-21 NOTE — ED Provider Notes (Signed)
[facility] EMERGENCY DEPARTMENT Provider Note   CSN: [id number] Arrival date & time: 06/21/22  1344     History  Chief Complaint  Patient presents with   Abdominal Pain    [name] is a 76 y.o. female, hx of chronic pancreatitis, who presents to the ED 2/2 to abdominal pain since this AM.  Has unable to tolerate p.o. intake since then.  She states she has vomited 10+ times.  Describes the abdominal pain as stabbing, epigastric focus, but all over at the same time per patient.  Denies any issues with bowels, last bowel movement was yesterday.  No chest pain, shortness of breath, fever, chills.  Pain is a 10 out of 10.  Has not taken anything for the pain.   Abdominal Pain Associated symptoms: nausea and vomiting   Associated symptoms: no chest pain, no constipation, no diarrhea and no shortness of breath        Home Medications Prior to Admission medications   Medication Sig Start Date End Date Taking? Authorizing Provider  albuterol (PROVENTIL HFA;VENTOLIN HFA) 108 (90 Base) MCG/ACT inhaler Inhale 2 puffs into the lungs every 4 (four) hours as needed for wheezing or shortness of breath. 02/12/18  Yes Francine Graven, DO  Calcium Carbonate (CALCIUM 600 PO)    Yes [provider]  cilostazol (PLETAL) 100 MG tablet Take 100 mg by mouth 2 (two) times daily.  03/29/15  Yes [provider]  cyclobenzaprine (FLEXERIL) 5 MG tablet Take 5 mg by mouth at bedtime. 05/31/18  Yes [provider]  doxycycline (VIBRAMYCIN) 100 MG capsule Take 1 capsule (100 mg total) by mouth 2 (two) times daily for 7 days. 06/15/22 06/22/22 Yes Noemi Chapel, MD  DULoxetine (CYMBALTA) 60 MG capsule Take 60 mg by mouth 2 (two) times daily.    Yes [provider]  escitalopram (LEXAPRO) 10 MG tablet Take 10 mg by mouth daily. 01/30/17  Yes [provider]  Fluticasone-Salmeterol (ADVAIR) 250-50 MCG/DOSE AEPB Inhale 1 puff into the lungs as needed.    Yes [provider]  hydrOXYzine (ATARAX/VISTARIL) 25 MG tablet Take 1 tablet by mouth as needed for anxiety or itching.  04/21/16  Yes [provider]  LORazepam (ATIVAN) 1 MG tablet Take 1 mg by mouth every 8 (eight) hours as needed for anxiety. Reported on 11/19/2015   Yes [provider]  lubiprostone (AMITIZA) 24 MCG capsule TAKE 1 CAPSULE BY MOUTH TWICE DAILY WITH A MEAL. 07/16/21  Yes Annitta Needs, NP  mupirocin cream (BACTROBAN) 2 % Apply 1 Application topically 2 (two) times daily. 06/15/22  Yes Noemi Chapel, MD  oxyCODONE-acetaminophen (PERCOCET) 10-325 MG tablet Take 1 tablet by mouth as needed. 12/27/20  Yes [provider]  Pancrelipase, Lip-Prot-Amyl, (CREON) 24000-76000 units CPEP Take 2 capsules with meals three times daily. Take 1 capsule with snacks. 8 total capsules per day. 10/27/21  Yes Mahala Menghini, PA-C  pantoprazole (PROTONIX) 40 MG tablet Take 1 tablet (40 mg total) by mouth 2 (two) times daily before a meal. 07/16/21  Yes Annitta Needs, NP  potassium chloride SA (K-DUR,KLOR-CON) 20 MEQ tablet Take 20 mEq by mouth daily.   Yes [provider]  PROLIA 60 MG/ML SOSY injection Inject 60 mg into the skin every 6 (six) months.  05/31/18  Yes [provider]  promethazine (PHENERGAN) 25 MG tablet Take 25 mg by mouth as needed for nausea or vomiting.    Yes [provider]  zolpidem (  AMBIEN) 10 MG tablet Take 10 mg by mouth at bedtime. 06/12/22  Yes [provider]      Allergies    Ibuprofen, Iohexol, Other, Strawberry (diagnostic), and Strawberry extract    Review of Systems   Review of Systems  Respiratory:  Negative for shortness of breath.   Cardiovascular:  Negative for chest pain.  Gastrointestinal:  Positive for abdominal pain, nausea and vomiting. Negative for constipation and diarrhea.    Physical Exam Updated Vital Signs BP (!) 147/79 (BP Location: Right Arm)   Pulse 85   Temp 98.3 F (36.8 C)   Resp 14    Ht '5\' 4"'$  (1.626 m)   Wt 36.3 kg   SpO2 94%   BMI 13.73 kg/m  Physical Exam Vitals and nursing note reviewed.  Constitutional:      Appearance: Normal appearance.     Comments: +emaciated  HENT:     Head: Normocephalic and atraumatic.     Nose: Nose normal.     Mouth/Throat:     Mouth: Mucous membranes are moist.  Eyes:     Extraocular Movements: Extraocular movements intact.     Conjunctiva/sclera: Conjunctivae normal.     Pupils: Pupils are equal, round, and reactive to light.  Cardiovascular:     Rate and Rhythm: Normal rate and regular rhythm.  Pulmonary:     Effort: Pulmonary effort is normal.     Breath sounds: Normal breath sounds.  Abdominal:     General: Abdomen is flat. Bowel sounds are normal.     Palpations: Abdomen is soft.     Tenderness: There is generalized abdominal tenderness and tenderness in the epigastric area. There is guarding.  Musculoskeletal:        General: Normal range of motion.     Cervical back: Normal range of motion and neck supple.  Skin:    General: Skin is warm and dry.     Capillary Refill: Capillary refill takes less than 2 seconds.  Neurological:     General: No focal deficit present.     Mental Status: She is alert.  Psychiatric:        Mood and Affect: Mood normal.        Thought Content: Thought content normal.     ED Results / Procedures / Treatments   Labs (all labs ordered are listed, but only abnormal results are displayed) Labs Reviewed  COMPREHENSIVE METABOLIC PANEL - Abnormal; Notable for the following components:      Result Value   Potassium 2.9 (*)    Glucose, Bld 130 (*)    Calcium 8.8 (*)    Total Protein 6.3 (*)    All other components within normal limits  LIPASE, BLOOD - Abnormal; Notable for the following components:   Lipase 1,142 (*)    All other components within normal limits  CBC WITH DIFFERENTIAL/PLATELET - Abnormal; Notable for the following components:   WBC 18.8 (*)    Platelets 539 (*)     Neutro Abs 15.3 (*)    Monocytes Absolute 1.4 (*)    Abs Immature Granulocytes 0.15 (*)    All other components within normal limits  MAGNESIUM - Abnormal; Notable for the following components:   Magnesium 1.5 (*)    All other components within normal limits  PHOSPHORUS  URINALYSIS, ROUTINE W REFLEX MICROSCOPIC  COMPREHENSIVE METABOLIC PANEL  CBC  APTT  MAGNESIUM  PHOSPHORUS    EKG EKG Interpretation  Date/Time:  Sunday June 21 2022 13:55:49 EDT  Ventricular Rate:  60 PR Interval:  245 QRS Duration: 110 QT Interval:  453 QTC Calculation: 453 R Axis:   78 Text Interpretation: Second degree AV block, Consider left atrial enlargement Borderline T abnormalities, anterior leads block is new. PR not consistently prolonging Confirmed by Davonna Belling [phone]) on 06/21/2022 2:10:12 PM  Radiology CT ABDOMEN PELVIS WO CONTRAST  Result Date: 06/21/2022 CLINICAL DATA:  Abdominal pain EXAM: CT ABDOMEN AND PELVIS WITHOUT CONTRAST TECHNIQUE: Multidetector CT imaging of the abdomen and pelvis was performed following the standard protocol without IV contrast. RADIATION DOSE REDUCTION: This exam was performed according to the departmental dose-optimization program which includes automated exposure control, adjustment of the mA and/or kV according to patient size and/or use of iterative reconstruction technique. COMPARISON:  04/08/2019 FINDINGS: Lower chest: Emphysematous changes.  No acute abnormality. Hepatobiliary: No focal hepatic abnormality. Gallbladder unremarkable. Pancreas: There is stranding around the pancreas. Pancreatic head appears prominent. Findings suggestive of acute pancreatitis. Calcifications in the pancreatic head compatible chronic pancreatitis. No ductal dilatation. Spleen: No focal abnormality.  Normal size. Adrenals/Urinary Tract: No adrenal abnormality. No focal renal abnormality. No stones or hydronephrosis. Urinary bladder is unremarkable. Stomach/Bowel:  Inflammation/stranding around the duodenum likely related to pancreatitis. Mild gaseous distention of the transverse colon might reflect focal ileus. Moderate stool burden in the colon. No evidence of bowel obstruction. Vascular/Lymphatic: Aortic atherosclerosis. No evidence of aneurysm or adenopathy. Reproductive: Prior hysterectomy.  No adnexal masses. Other: Dalon Reichart amount of free fluid in the abdomen and pelvis. Musculoskeletal: No acute bony abnormality. IMPRESSION: Inflammation/stranding around the pancreas. Enlargement pancreatic sq. Findings most compatible with acute pancreatitis. Adjacent inflammation surrounding the duodenum. Calcifications in the pancreatic head compatible chronic pancreatitis. Ahliya Glatt amount of free fluid in the abdomen and pelvis. Aortic atherosclerosis. Electronically Signed   By: Rolm Baptise M.D.   On: 06/21/2022 18:35    Procedures Procedures    Medications Ordered in ED Medications  enoxaparin (LOVENOX) injection 30 mg (30 mg Subcutaneous Given 06/21/22 2109)  ondansetron (ZOFRAN) injection 4 mg (4 mg Intravenous Given 06/21/22 2108)  lipase/protease/amylase (CREON) capsule 48,000 Units (has no administration in time range)  HYDROmorphone (DILAUDID) injection 0.5 mg (0.5 mg Intravenous Given 06/22/22 0129)  lactated ringers infusion ( Intravenous New Bag/Given 06/21/22 2106)  pantoprazole (PROTONIX) injection 40 mg (40 mg Intravenous Given 06/21/22 2116)  lubiprostone (AMITIZA) capsule 24 mcg (has no administration in time range)  albuterol (PROVENTIL) (2.5 MG/3ML) 0.083% nebulizer solution 3 mL (has no administration in time range)  mometasone-formoterol (DULERA) 200-5 MCG/ACT inhaler 2 puff (has no administration in time range)  feeding supplement (ENSURE ENLIVE / ENSURE PLUS) liquid 237 mL (has no administration in time range)  prochlorperazine (COMPAZINE) injection 10 mg (10 mg Intravenous Given 06/22/22 0005)  sodium chloride 0.9 % bolus 1,000 mL (0 mLs Intravenous  Stopped 06/21/22 1638)  ondansetron (ZOFRAN) injection 4 mg (4 mg Intravenous Given 06/21/22 1422)  fentaNYL (SUBLIMAZE) injection 12.5 mcg (12.5 mcg Intravenous Given 06/21/22 1422)  HYDROmorphone (DILAUDID) injection 0.5 mg (0.5 mg Intravenous Given 06/21/22 1510)  magnesium sulfate IVPB 2 g 50 mL (0 g Intravenous Stopped 06/21/22 1725)  potassium chloride 10 mEq in 100 mL IVPB (0 mEq Intravenous Stopped 06/21/22 2001)  HYDROmorphone (DILAUDID) injection 0.5 mg (0.5 mg Intravenous Given 06/21/22 1744)  magnesium sulfate IVPB 2 g 50 mL (0 g Intravenous Stopped 06/21/22 2231)    ED Course/ Medical Decision Making/ A&P Clinical Course as of 06/22/22 0151  Sun Jun 21, 2022  1536 Lipase(!): 1,142 [  BS]    Clinical Course User Index [BS] Shaylon Aden, Si Gaul, PA                           Medical Decision Making Amount and/or Complexity of Data Reviewed Labs: ordered. Decision-making details documented in ED Course. Radiology: ordered.  Risk Prescription drug management. Decision regarding hospitalization.   This patient presents to the ED for concern of abdominal pain, this involves an extensive number of treatment options, and is a complaint that carries with it a high risk of complications and morbidity.     Co morbidities that complicate the patient evaluation  Chronic pancreatitis,chronic abdominal pain   Lab Tests:  I Ordered, and personally interpreted labs.  The pertinent results include:  lipase of 1142, K of 2.9, Mg of 1.5, leukocytosis of 18k+   Imaging Studies ordered:  I ordered imaging studies including CT abd/pelvis w/o contrast  I independently visualized and interpreted imaging which showed likely acute on chronic pancreatitis I agree with the radiologist interpretation   Cardiac Monitoring: / EKG:  The patient was maintained on a cardiac monitor.  I personally viewed and interpreted the cardiac monitored which showed an underlying rhythm of: second degree AV  block-->this is new and will require f/u w/cardiology. New based off past EKGs, pt however does not have any chest pain, shortness of breath, or syncope and is an incidental finding  Consultations Obtained:  I requested consultation with the hospitalist,  and discussed lab and imaging findings as well as pertinent plan - they recommend: admission   Problem List / ED Course / Critical interventions / Medication management  I ordered medication including fentanyl, dilaudid  for pain. 34mq Kcl IV for hypokalemia. 2g MgSO4 for hypomagnesemia.  Reevaluation of the patient after these medicines showed that the patient improved I have reviewed the patients home medicines and have made adjustments as needed   Test / Admission - Considered:  Patient is a 76year old female, history of chronic pancreatitis, chronic abdominal pain, who presents to the ED secondary for abdominal pain since this AM.  She had guarding on exam, and was diffusely tender.  She required multiple doses of pain medications to help improve her pain.  She is found to be hypokalemic with a potassium of 2.9, 40 mEq IV potassium chloride was ordered, and started magnesium was low as well with at 1.5, she was given 2 g of magnesium sulfate.  EKG was concerning for new second-degree block, and it is advised that cardiology follow-up with this patient--I believe this finding is incidental as patient denies any chest pain, shortness of breath, syncope.  Patient admitted to hospitalist secondary to findings of acute on chronic pancreatitis on CT, Marlow Berenguer amount of free fluid on CT, unfortunately patient was not able to drink oral contrast secondary to severe nausea even with pain medications.  She was admitted to the hospital for pain control, further IV fluids, monitoring of her electrolytes. Final Clinical Impression(s) / ED Diagnoses Final diagnoses:  [place] chronic [place]  Second degree AV block, Mobitz type II  Hypokalemia   Hypomagnesemia    Rx / DC Orders ED Discharge Orders     None         Katlen Seyer, BSi Gaul PA 06/22/22 0155    PDavonna Belling MD 06/22/22 [phone]

## 2022-06-21 NOTE — ED Notes (Signed)
Pt informed of need for urine sample. Unable to give sample at this time.

## 2022-06-22 ENCOUNTER — Inpatient Hospital Stay (HOSPITAL_COMMUNITY): Payer: Medicare Other

## 2022-06-22 DIAGNOSIS — K859 Acute pancreatitis without necrosis or infection, unspecified: Secondary | ICD-10-CM | POA: Diagnosis not present

## 2022-06-22 DIAGNOSIS — K861 Other chronic pancreatitis: Secondary | ICD-10-CM | POA: Diagnosis not present

## 2022-06-22 LAB — CBC
HCT: 36.7 % (ref 36.0–46.0)
Hemoglobin: 12.6 g/dL (ref 12.0–15.0)
MCH: 31 pg (ref 26.0–34.0)
MCHC: 34.3 g/dL (ref 30.0–36.0)
MCV: 90.4 fL (ref 80.0–100.0)
Platelets: 528 10*3/uL — ABNORMAL HIGH (ref 150–400)
RBC: 4.06 MIL/uL (ref 3.87–5.11)
RDW: 13.3 % (ref 11.5–15.5)
WBC: 23.2 10*3/uL — ABNORMAL HIGH (ref 4.0–10.5)
nRBC: 0 % (ref 0.0–0.2)

## 2022-06-22 LAB — URINALYSIS, ROUTINE W REFLEX MICROSCOPIC
Bacteria, UA: NONE SEEN
Bilirubin Urine: NEGATIVE
Glucose, UA: NEGATIVE mg/dL
Ketones, ur: 5 mg/dL — AB
Leukocytes,Ua: NEGATIVE
Nitrite: NEGATIVE
Protein, ur: NEGATIVE mg/dL
Specific Gravity, Urine: 1.016 (ref 1.005–1.030)
pH: 6 (ref 5.0–8.0)

## 2022-06-22 LAB — COMPREHENSIVE METABOLIC PANEL
ALT: 8 U/L (ref 0–44)
AST: 15 U/L (ref 15–41)
Albumin: 3.2 g/dL — ABNORMAL LOW (ref 3.5–5.0)
Alkaline Phosphatase: 49 U/L (ref 38–126)
Anion gap: 7 (ref 5–15)
BUN: 13 mg/dL (ref 8–23)
CO2: 25 mmol/L (ref 22–32)
Calcium: 7.5 mg/dL — ABNORMAL LOW (ref 8.9–10.3)
Chloride: 103 mmol/L (ref 98–111)
Creatinine, Ser: 0.41 mg/dL — ABNORMAL LOW (ref 0.44–1.00)
GFR, Estimated: >60 mL/min (ref >60)
Glucose, Bld: 96 mg/dL (ref 70–99)
Potassium: 3.8 mmol/L (ref 3.5–5.1)
Sodium: 135 mmol/L (ref 135–145)
Total Bilirubin: 0.8 mg/dL (ref 0.3–1.2)
Total Protein: 5.8 g/dL — ABNORMAL LOW (ref 6.5–8.1)

## 2022-06-22 LAB — PHOSPHORUS: Phosphorus: 2 mg/dL — ABNORMAL LOW (ref 2.5–4.6)

## 2022-06-22 LAB — LIPID PANEL
Cholesterol: 144 mg/dL (ref 0–200)
HDL: 44 mg/dL (ref >40)
LDL Cholesterol: 85 mg/dL (ref 0–99)
Total CHOL/HDL Ratio: 3.3 RATIO
Triglycerides: 77 mg/dL (ref <150)
VLDL: 15 mg/dL (ref 0–40)

## 2022-06-22 LAB — MAGNESIUM: Magnesium: 2.2 mg/dL (ref 1.7–2.4)

## 2022-06-22 LAB — APTT: aPTT: 36 seconds (ref 24–36)

## 2022-06-22 MED ORDER — LUBIPROSTONE 24 MCG PO CAPS
24.0000 ug | ORAL_CAPSULE | Freq: Two times a day (BID) | ORAL | Status: DC
  Administered 2022-06-24: 24 ug via ORAL
  Filled 2022-06-22 (×2): qty 1

## 2022-06-22 MED ORDER — LACTATED RINGERS IV SOLN: INTRAVENOUS | Status: DC

## 2022-06-22 MED ORDER — ALBUTEROL SULFATE (2.5 MG/3ML) 0.083% IN NEBU: 3.0000 mL | INHALATION_SOLUTION | RESPIRATORY_TRACT | Status: DC | PRN

## 2022-06-22 MED ORDER — MOMETASONE FURO-FORMOTEROL FUM 200-5 MCG/ACT IN AERO
2.0000 | INHALATION_SPRAY | Freq: Two times a day (BID) | RESPIRATORY_TRACT | Status: DC
  Administered 2022-06-22 – 2022-06-26 (×9): 2 via RESPIRATORY_TRACT
  Filled 2022-06-22: qty 8.8

## 2022-06-22 NOTE — Progress Notes (Signed)
PROGRESS NOTE    [name]  ZOX:[id number] DOB: [birth year] DOA: 06/21/2022 PCP: Lemmie Evens, MD   Brief Narrative:    [name] is a 76 y.o. female with medical history significant of chronic abdominal pain (RUQ) and chronic pancreatitis with calcifications of the pancreatic head, COPD, weight loss, constipation, GERD, failure to thrive and tobacco use who presents to the emergency department due to abdominal pain which started this morning.  Patient was admitted with acute on chronic pancreatitis and has been started on aggressive IV fluid.  Assessment & Plan:   Principal Problem:   Acute on chronic pancreatitis (HCC) Active Problems:   Tobacco abuse   COPD (chronic obstructive pulmonary disease) (HCC)   GERD   Abdominal pain   Nausea & vomiting   Constipation   Failure to thrive in adult   Hypokalemia   Hypomagnesemia   Leukocytosis   Thrombocytosis   Prolonged QT interval   Dehydration  Assessment and Plan:   Abdominal pain, nausea and vomiting secondary to acute on chronic pancreatitis CT abdomen pelvis without contrast was suggestive of acute pancreatitis Elevated lipase, monitor BISAP Score = 1 point Continue IV Compazine p.r.n Continue IV Dilaudid p.r.n for pain Continue Protonix Continue IV LR at 125 mL/h Keep n.p.o. Continue Creon No significant elevation in triglycerides and no significant alcohol use RUQ U/S in the morning to investigate biliary etiology (gallstone and bile duct dilatation)   Dehydration Continue IV hydration   Prolonged QT interval QTc 544 ms Avoid QT prolonging drugs Magnesium and potassium will be replenished Repeat EKG in the morning   Leukocytosis possibly reactive WBC 18.8, this may be due to stress demargination Continue to monitor WBC with morning labs   Thrombocytosis possibly reactive Platelets 539, this may be due to stress demargination Continue to monitor platelet level with morning labs    GERD Continue Protonix   Constipation Continue Amitiza   COPD Continue ventilating, Dulera   Failure to thrive in adult (BMI 13.73) Protein supplement to be provided Dietitian will be consulted and we shall await further recommendation   Tobacco abuse Patient was counseled on tobacco abuse cessation    DVT prophylaxis:Lovenox Code Status: Full Family Communication: Granddaughter at bedside 9/25 Disposition Plan:  Status is: Inpatient Remains inpatient appropriate because: Need for IV fluid.   Consultants:  None  Procedures:  None  Antimicrobials:  None   Subjective: Patient seen and evaluated today with minimal improvement in abdominal pain noted which she currently rates 6/10.  She continues to have ongoing nausea, but no further vomiting.  Objective: Vitals:   06/22/22 0030 06/22/22 0106 06/22/22 0500 06/22/22 0857  BP: (!) 148/84 (!) 147/79 125/77   Pulse: 82 85 94   Resp: '17 14 16   '$ Temp:  98.3 F (36.8 C) 97.6 F (36.4 C)   TempSrc:      SpO2: 96% 94% 92% 93%  Weight:      Height:        Intake/Output Summary (Last 24 hours) at 06/22/2022 0904 Last data filed at 06/22/2022 0500 Gross per 24 hour  Intake 957.92 ml  Output --  Net 957.92 ml   Filed Weights   06/21/22 1351  Weight: 36.3 kg    Examination:  General exam: Appears calm and comfortable  Respiratory system: Clear to auscultation. Respiratory effort normal. Cardiovascular system: S1 & S2 heard, RRR.  Gastrointestinal system: Abdomen is soft Central nervous system: Alert and awake Extremities: No edema Skin: No significant lesions  noted Psychiatry: Flat affect.    Data Reviewed: I have personally reviewed following labs and imaging studies  CBC: Recent Labs  Lab 06/21/22 1438  WBC 18.8*  NEUTROABS 15.3*  HGB 12.8  HCT 37.1  MCV 88.8  PLT 130*   Basic Metabolic Panel: Recent Labs  Lab 06/21/22 1438 06/21/22 1442 06/22/22 0629  NA 138  --  135  K 2.9*  --  3.8   CL 102  --  103  CO2 23  --  25  GLUCOSE 130*  --  96  BUN 23  --  13  CREATININE 0.54  --  0.41*  CALCIUM 8.8*  --  7.5*  MG  --  1.5* 2.2  PHOS  --  3.0 2.0*   GFR: Estimated Creatinine Clearance: 34.8 mL/min (A) (by C-G formula based on SCr of 0.41 mg/dL (L)). Liver Function Tests: Recent Labs  Lab 06/21/22 1438 06/22/22 0629  AST 15 15  ALT 8 8  ALKPHOS 54 49  BILITOT 0.7 0.8  PROT 6.3* 5.8*  ALBUMIN 3.5 3.2*   Recent Labs  Lab 06/21/22 1438  LIPASE 1,142*   No results for input(s): "AMMONIA" in the last 168 hours. Coagulation Profile: No results for input(s): "INR", "PROTIME" in the last 168 hours. Cardiac Enzymes: No results for input(s): "CKTOTAL", "CKMB", "CKMBINDEX", "TROPONINI" in the last 168 hours. BNP (last 3 results) No results for input(s): "PROBNP" in the last 8760 hours. HbA1C: No results for input(s): "HGBA1C" in the last 72 hours. CBG: No results for input(s): "GLUCAP" in the last 168 hours. Lipid Profile: Recent Labs    06/22/22 0629  CHOL 144  HDL 44  LDLCALC 85  TRIG 77  CHOLHDL 3.3   Thyroid Function Tests: No results for input(s): "TSH", "T4TOTAL", "FREET4", "T3FREE", "THYROIDAB" in the last 72 hours. Anemia Panel: No results for input(s): "VITAMINB12", "FOLATE", "FERRITIN", "TIBC", "IRON", "RETICCTPCT" in the last 72 hours. Sepsis Labs: No results for input(s): "PROCALCITON", "LATICACIDVEN" in the last 168 hours.  No results found for this or any previous visit (from the past 240 hour(s)).       Radiology Studies: CT ABDOMEN PELVIS WO CONTRAST  Result Date: 06/21/2022 CLINICAL DATA:  Abdominal pain EXAM: CT ABDOMEN AND PELVIS WITHOUT CONTRAST TECHNIQUE: Multidetector CT imaging of the abdomen and pelvis was performed following the standard protocol without IV contrast. RADIATION DOSE REDUCTION: This exam was performed according to the departmental dose-optimization program which includes automated exposure control, adjustment  of the mA and/or kV according to patient size and/or use of iterative reconstruction technique. COMPARISON:  04/08/2019 FINDINGS: Lower chest: Emphysematous changes.  No acute abnormality. Hepatobiliary: No focal hepatic abnormality. Gallbladder unremarkable. Pancreas: There is stranding around the pancreas. Pancreatic head appears prominent. Findings suggestive of acute pancreatitis. Calcifications in the pancreatic head compatible chronic pancreatitis. No ductal dilatation. Spleen: No focal abnormality.  Normal size. Adrenals/Urinary Tract: No adrenal abnormality. No focal renal abnormality. No stones or hydronephrosis. Urinary bladder is unremarkable. Stomach/Bowel: Inflammation/stranding around the duodenum likely related to pancreatitis. Mild gaseous distention of the transverse colon might reflect focal ileus. Moderate stool burden in the colon. No evidence of bowel obstruction. Vascular/Lymphatic: Aortic atherosclerosis. No evidence of aneurysm or adenopathy. Reproductive: Prior hysterectomy.  No adnexal masses. Other: Small amount of free fluid in the abdomen and pelvis. Musculoskeletal: No acute bony abnormality. IMPRESSION: Inflammation/stranding around the pancreas. Enlargement pancreatic sq. Findings most compatible with acute pancreatitis. Adjacent inflammation surrounding the duodenum. Calcifications in the pancreatic head compatible chronic  pancreatitis. Small amount of free fluid in the abdomen and pelvis. Aortic atherosclerosis. Electronically Signed   By: Rolm Baptise M.D.   On: 06/21/2022 18:35        Scheduled Meds:  enoxaparin (LOVENOX) injection  30 mg Subcutaneous Q24H   feeding supplement  237 mL Oral BID BM   lipase/protease/amylase  48,000 Units Oral TID WC   lubiprostone  24 mcg Oral BID WC   mometasone-formoterol  2 puff Inhalation BID   pantoprazole (PROTONIX) IV  40 mg Intravenous Q12H   Continuous Infusions:  lactated ringers 125 mL/hr at 06/22/22 0832     LOS: 1 day     Time spent: 35 minutes    Ethridge Sollenberger Darleen Crocker, DO [facility]  If 7PM-7AM, please contact night-coverage [email or web address] 06/22/2022, 9:04 AM

## 2022-06-22 NOTE — TOC Progression Note (Signed)
Transition of Care [facility]) - Progression Note    Patient Details  Name: CHONTE RICKE MRN: [id number] Date of Birth: Aug 06, 1946  Transition of Care [facility]) CM/SW Contact  Salome Arnt, [place] Phone Number: 06/22/2022, 9:57 AM  Clinical Narrative:   Transition of Care (TOC) Screening Note   Patient Details  Name: Rashea Hoskie [place] Date of Birth: [birth year]   Transition of Care [facility]) CM/SW Contact:    Salome Arnt, [place] Phone Number: 06/22/2022, 9:57 AM    Transition of Care Department [facility]) has reviewed patient and no TOC needs have been identified at this time. We will continue to monitor patient advancement through interdisciplinary progression rounds. If new patient transition needs arise, please place a TOC consult.            Expected Discharge Plan and Services                                                 Social Determinants of Health (SDOH) Interventions    Readmission Risk Interventions     No data to display

## 2022-06-22 NOTE — Progress Notes (Addendum)
Initial Nutrition Assessment  DOCUMENTATION CODES:  Severe malnutrition in context of chronic illness  Underweight  INTERVENTION:  Ensure Enlive po BID, each supplement provides 350 kcal and 20 grams of protein.   Multivitamin daily  Pancreatitis Eating Plan and Malnutrition handouts attached to AVS  NUTRITION DIAGNOSIS:   Severe Malnutrition related to chronic illness (acute on chronic pancreatitis) as evidenced by per patient/family report, energy intake < or equal to 75% for > or equal to 1 month, moderate fat depletion, severe fat depletion, moderate muscle depletion, severe muscle depletion.   GOAL:  Patient will meet greater than or equal to 90% of their needs  MONITOR:  Diet advancement, Labs, Weight trends  REASON FOR ASSESSMENT:   Consult Assessment of nutrition requirement/status  ASSESSMENT: Patient is an underweight 76 yo female with history of chronic pancreatitis, constipation, COPD, weight loss, stroke, CAD and Failure to Thrive in adult. Presents acute on chronic pancreatitis.  Patient NPO for ultrasound.   Usual intake -sandwich mid-day (pimento cheese) and frozen meal for dinner. (Not a breakfast eater). She drinks sweet tea with meals. Poor dentition noted. Able to feed herself and prepares her own food.   Patient says, "I have always been small". Patient likes chocolate Ensure but can't afford. Talked with her about trying alternate generic brand Equate which has similar nutrient profile and is more affordable. Encouraged consistent protein, fruits and vegetables daily.   Based on diet history- chronically undernourished-suspect social/environmental factors as well as acute on chronic pancreatitis. Minimal muscle and fat reserves.  Patient usual weight 35-36 kg range since October of 2019.   Medications reviewed. Creon 48,000 -TID, compazine -prn.  Lactated Ringers '@125'$  ml/hr.      Latest Ref Rng & Units 06/22/2022    6:29 AM 06/21/2022    2:38 PM  05/14/2020    3:17 PM  BMP  Glucose 70 - 99 mg/dL 96  130  82   BUN 8 - 23 mg/dL '13  23  15   '$ Creatinine 0.44 - 1.00 mg/dL 0.41  0.54  0.61   BUN/Creat Ratio 6 - 22 (calc)   NOT APPLICABLE   Sodium 656 - 145 mmol/L 135  138  140   Potassium 3.5 - 5.1 mmol/L 3.8  2.9  4.2   Chloride 98 - 111 mmol/L 103  102  101   CO2 22 - 32 mmol/L '25  23  31   '$ Calcium 8.9 - 10.3 mg/dL 7.5  8.8  10.1        NUTRITION - FOCUSED PHYSICAL EXAM:  Flowsheet Row Most Recent Value  Orbital Region Moderate depletion  Upper Arm Region Severe depletion  Thoracic and Lumbar Region Moderate depletion  Buccal Region Severe depletion  Temple Region Mild depletion  Clavicle Bone Region Severe depletion  Clavicle and Acromion Bone Region Moderate depletion  Dorsal Hand Moderate depletion  Patellar Region Severe depletion  Anterior Thigh Region Severe depletion  Posterior Calf Region Severe depletion  Edema (RD Assessment) None  Eyes Reviewed  Mouth Reviewed  Skin Reviewed  Nails Reviewed       Diet Order:   Diet Order             Diet NPO time specified Except for: Ice Chips, Sips with Meds  Diet effective now                   EDUCATION NEEDS:  Education needs have been addressed  Skin:  Skin Assessment: Reviewed RN Assessment  Last  BM:  9/24  Height:   Ht Readings from Last 1 Encounters:  06/21/22 '5\' 4"'$  (1.626 m)    Weight:   Wt Readings from Last 1 Encounters:  06/21/22 36.3 kg    Ideal Body Weight:   55 kg  BMI:  Body mass index is 13.73 kg/m.  Estimated Nutritional Needs:   Kcal:  1400-1500  Protein:  62-70 gr  Fluid:  >1100 ml daily  Colman Cater MS,RD,CSG,LDN Contact: Shea Evans

## 2022-06-23 DIAGNOSIS — K859 Acute pancreatitis without necrosis or infection, unspecified: Secondary | ICD-10-CM | POA: Diagnosis not present

## 2022-06-23 DIAGNOSIS — K861 Other chronic pancreatitis: Secondary | ICD-10-CM | POA: Diagnosis not present

## 2022-06-23 LAB — CBC
HCT: 33.3 % — ABNORMAL LOW (ref 36.0–46.0)
Hemoglobin: 11.4 g/dL — ABNORMAL LOW (ref 12.0–15.0)
MCH: 31.1 pg (ref 26.0–34.0)
MCHC: 34.2 g/dL (ref 30.0–36.0)
MCV: 91 fL (ref 80.0–100.0)
Platelets: 385 10*3/uL (ref 150–400)
RBC: 3.66 MIL/uL — ABNORMAL LOW (ref 3.87–5.11)
RDW: 13.4 % (ref 11.5–15.5)
WBC: 19.3 10*3/uL — ABNORMAL HIGH (ref 4.0–10.5)
nRBC: 0 % (ref 0.0–0.2)

## 2022-06-23 LAB — COMPREHENSIVE METABOLIC PANEL
ALT: 7 U/L (ref 0–44)
AST: 11 U/L — ABNORMAL LOW (ref 15–41)
Albumin: 2.4 g/dL — ABNORMAL LOW (ref 3.5–5.0)
Alkaline Phosphatase: 44 U/L (ref 38–126)
Anion gap: 7 (ref 5–15)
BUN: 7 mg/dL — ABNORMAL LOW (ref 8–23)
CO2: 25 mmol/L (ref 22–32)
Calcium: 7.4 mg/dL — ABNORMAL LOW (ref 8.9–10.3)
Chloride: 101 mmol/L (ref 98–111)
Creatinine, Ser: 0.41 mg/dL — ABNORMAL LOW (ref 0.44–1.00)
GFR, Estimated: >60 mL/min (ref >60)
Glucose, Bld: 59 mg/dL — ABNORMAL LOW (ref 70–99)
Potassium: 3.5 mmol/L (ref 3.5–5.1)
Sodium: 133 mmol/L — ABNORMAL LOW (ref 135–145)
Total Bilirubin: 1.1 mg/dL (ref 0.3–1.2)
Total Protein: 5.1 g/dL — ABNORMAL LOW (ref 6.5–8.1)

## 2022-06-23 LAB — MAGNESIUM: Magnesium: 1.9 mg/dL (ref 1.7–2.4)

## 2022-06-23 LAB — LIPASE, BLOOD: Lipase: 116 U/L — ABNORMAL HIGH (ref 11–51)

## 2022-06-23 NOTE — Progress Notes (Signed)
Pt spo2 77% on room air o2 back on at 1.5lpm spo2 increased to 82% increased o2 to 3lpm cann spo2 increased to 92%

## 2022-06-23 NOTE — Progress Notes (Signed)
PROGRESS NOTE    [name]  YHC:[id number] DOB: 1946/03/16 DOA: 06/21/2022 PCP: Lemmie Evens, MD   Brief Narrative:    [name] is a 76 y.o. female with medical history significant of chronic abdominal pain (RUQ) and chronic pancreatitis with calcifications of the pancreatic head, COPD, weight loss, constipation, GERD, failure to thrive and tobacco use who presents to the emergency department due to abdominal pain which started this morning.  Patient was admitted with acute on chronic pancreatitis and has been started on aggressive IV fluid.  Assessment & Plan:   Principal Problem:   Acute on chronic pancreatitis (HCC) Active Problems:   Tobacco abuse   COPD (chronic obstructive pulmonary disease) (HCC)   GERD   Abdominal pain   Nausea & vomiting   Constipation   Failure to thrive in adult   Hypokalemia   Hypomagnesemia   Leukocytosis   Thrombocytosis   Prolonged QT interval   Dehydration  Assessment and Plan:   Abdominal pain, nausea and vomiting secondary to acute on chronic pancreatitis CT abdomen pelvis without contrast was suggestive of acute pancreatitis Elevated lipase, monitor BISAP Score = 1 point Continue IV Compazine p.r.n Continue IV Dilaudid p.r.n for pain Continue Protonix Continue IV LR at 125 mL/h Keep n.p.o. Continue Creon No significant elevation in triglycerides and no significant alcohol use RUQ U/S with no acute findings   Dehydration Continue IV hydration   Prolonged QT interval QTc 544 ms Avoid QT prolonging drugs Magnesium and potassium will be replenished Repeat EKG in the morning   Leukocytosis possibly reactive WBC 18.8, this may be due to stress demargination Continue to monitor WBC with morning labs   Thrombocytosis possibly reactive Platelets 539, this may be due to stress demargination Continue to monitor platelet level with morning labs   GERD Continue Protonix   Constipation Continue Amitiza    COPD Continue ventilating, Dulera   Failure to thrive in adult (BMI 13.73) Protein supplement to be provided Dietitian will be consulted and we shall await further recommendation   Tobacco abuse Patient was counseled on tobacco abuse cessation     DVT prophylaxis:Lovenox Code Status: Full Family Communication: Granddaughter at bedside 9/26 Disposition Plan:  Status is: Inpatient Remains inpatient appropriate because: Need for IV fluid.     Consultants:  None   Procedures:  None   Antimicrobials:  None  Subjective: Patient seen and evaluated today with ongoing epigastric pain she rates 7/10.  She denies any significant nausea or vomiting.  She does not have much of an appetite.  She would like to have some coffee.  Objective: Vitals:   06/22/22 2017 06/22/22 2057 06/23/22 0607 06/23/22 0729  BP:  103/73 116/67   Pulse:  (!) 101 84   Resp:  19 20   Temp:  99 F (37.2 C) 98.5 F (36.9 C)   TempSrc:  Oral Oral   SpO2: (!) 84% 96% 96% 95%  Weight:      Height:        Intake/Output Summary (Last 24 hours) at 06/23/2022 1109 Last data filed at 06/23/2022 0322 Gross per 24 hour  Intake 2000 ml  Output --  Net 2000 ml   Filed Weights   06/21/22 1351  Weight: 36.3 kg    Examination:  General exam: Appears calm and comfortable  Respiratory system: Clear to auscultation. Respiratory effort normal. Cardiovascular system: S1 & S2 heard, RRR.  Gastrointestinal system: Abdomen is soft Central nervous system: Alert and awake Extremities: No  edema Skin: No significant lesions noted Psychiatry: Flat affect.    Data Reviewed: I have personally reviewed following labs and imaging studies  CBC: Recent Labs  Lab 06/21/22 1438 06/22/22 0629 06/23/22 0833  WBC 18.8* 23.2* 19.3*  NEUTROABS 15.3*  --   --   HGB 12.8 12.6 11.4*  HCT 37.1 36.7 33.3*  MCV 88.8 90.4 91.0  PLT 539* 528* 144   Basic Metabolic Panel: Recent Labs  Lab 06/21/22 1438 06/21/22 1442  06/22/22 0629 06/23/22 0833  NA 138  --  135 133*  K 2.9*  --  3.8 3.5  CL 102  --  103 101  CO2 23  --  25 25  GLUCOSE 130*  --  96 59*  BUN 23  --  13 7*  CREATININE 0.54  --  0.41* 0.41*  CALCIUM 8.8*  --  7.5* 7.4*  MG  --  1.5* 2.2 1.9  PHOS  --  3.0 2.0*  --    GFR: Estimated Creatinine Clearance: 34.8 mL/min (A) (by C-G formula based on SCr of 0.41 mg/dL (L)). Liver Function Tests: Recent Labs  Lab 06/21/22 1438 06/22/22 0629 06/23/22 0833  AST 15 15 11*  ALT '8 8 7  '$ ALKPHOS 54 49 44  BILITOT 0.7 0.8 1.1  PROT 6.3* 5.8* 5.1*  ALBUMIN 3.5 3.2* 2.4*   Recent Labs  Lab 06/21/22 1438 06/23/22 0833  LIPASE 1,142* 116*   No results for input(s): "AMMONIA" in the last 168 hours. Coagulation Profile: No results for input(s): "INR", "PROTIME" in the last 168 hours. Cardiac Enzymes: No results for input(s): "CKTOTAL", "CKMB", "CKMBINDEX", "TROPONINI" in the last 168 hours. BNP (last 3 results) No results for input(s): "PROBNP" in the last 8760 hours. HbA1C: No results for input(s): "HGBA1C" in the last 72 hours. CBG: No results for input(s): "GLUCAP" in the last 168 hours. Lipid Profile: Recent Labs    06/22/22 0629  CHOL 144  HDL 44  LDLCALC 85  TRIG 77  CHOLHDL 3.3   Thyroid Function Tests: No results for input(s): "TSH", "T4TOTAL", "FREET4", "T3FREE", "THYROIDAB" in the last 72 hours. Anemia Panel: No results for input(s): "VITAMINB12", "FOLATE", "FERRITIN", "TIBC", "IRON", "RETICCTPCT" in the last 72 hours. Sepsis Labs: No results for input(s): "PROCALCITON", "LATICACIDVEN" in the last 168 hours.  No results found for this or any previous visit (from the past 240 hour(s)).       Radiology Studies: US Abdomen Limited  Result Date: 06/22/2022 CLINICAL DATA:  Abdominal pain, nausea, and vomiting. Findings of acute pancreatitis on recent CT. EXAM: ULTRASOUND ABDOMEN LIMITED RIGHT UPPER QUADRANT COMPARISON:  CT abdomen and pelvis 06/21/2022 FINDINGS:  Gallbladder: No gallstones or wall thickening visualized. No sonographic Murphy sign noted by sonographer. Common bile duct: Diameter: 3 mm Liver: No focal lesion identified. Within normal limits in parenchymal echogenicity. Portal vein is patent on color Doppler imaging with normal direction of blood flow towards the liver. Other: Trace perihepatic fluid. IMPRESSION: 1. Trace perihepatic fluid. 2. Normal appearance of the gallbladder.  No biliary dilatation. Electronically Signed   By: Logan Bores M.D.   On: 06/22/2022 11:50   CT ABDOMEN PELVIS WO CONTRAST  Result Date: 06/21/2022 CLINICAL DATA:  Abdominal pain EXAM: CT ABDOMEN AND PELVIS WITHOUT CONTRAST TECHNIQUE: Multidetector CT imaging of the abdomen and pelvis was performed following the standard protocol without IV contrast. RADIATION DOSE REDUCTION: This exam was performed according to the departmental dose-optimization program which includes automated exposure control, adjustment of the mA and/or  kV according to patient size and/or use of iterative reconstruction technique. COMPARISON:  04/08/2019 FINDINGS: Lower chest: Emphysematous changes.  No acute abnormality. Hepatobiliary: No focal hepatic abnormality. Gallbladder unremarkable. Pancreas: There is stranding around the pancreas. Pancreatic head appears prominent. Findings suggestive of acute pancreatitis. Calcifications in the pancreatic head compatible chronic pancreatitis. No ductal dilatation. Spleen: No focal abnormality.  Normal size. Adrenals/Urinary Tract: No adrenal abnormality. No focal renal abnormality. No stones or hydronephrosis. Urinary bladder is unremarkable. Stomach/Bowel: Inflammation/stranding around the duodenum likely related to pancreatitis. Mild gaseous distention of the transverse colon might reflect focal ileus. Moderate stool burden in the colon. No evidence of bowel obstruction. Vascular/Lymphatic: Aortic atherosclerosis. No evidence of aneurysm or adenopathy.  Reproductive: Prior hysterectomy.  No adnexal masses. Other: Small amount of free fluid in the abdomen and pelvis. Musculoskeletal: No acute bony abnormality. IMPRESSION: Inflammation/stranding around the pancreas. Enlargement pancreatic sq. Findings most compatible with acute pancreatitis. Adjacent inflammation surrounding the duodenum. Calcifications in the pancreatic head compatible chronic pancreatitis. Small amount of free fluid in the abdomen and pelvis. Aortic atherosclerosis. Electronically Signed   By: Rolm Baptise M.D.   On: 06/21/2022 18:35        Scheduled Meds:  enoxaparin (LOVENOX) injection  30 mg Subcutaneous Q24H   feeding supplement  237 mL Oral BID BM   lipase/protease/amylase  48,000 Units Oral TID WC   lubiprostone  24 mcg Oral BID WC   mometasone-formoterol  2 puff Inhalation BID   pantoprazole (PROTONIX) IV  40 mg Intravenous Q12H   Continuous Infusions:  lactated ringers 125 mL/hr at 06/23/22 0322     LOS: 2 days    Time spent: 35 minutes    Quetzally Callas Darleen Crocker, DO [facility]  If 7PM-7AM, please contact night-coverage [email or web address] 06/23/2022, 11:09 AM

## 2022-06-24 ENCOUNTER — Inpatient Hospital Stay (HOSPITAL_COMMUNITY): Payer: Medicare Other

## 2022-06-24 DIAGNOSIS — E43 Unspecified severe protein-calorie malnutrition: Secondary | ICD-10-CM | POA: Insufficient documentation

## 2022-06-24 DIAGNOSIS — K859 Acute pancreatitis without necrosis or infection, unspecified: Secondary | ICD-10-CM | POA: Diagnosis not present

## 2022-06-24 DIAGNOSIS — K861 Other chronic pancreatitis: Secondary | ICD-10-CM | POA: Diagnosis not present

## 2022-06-24 LAB — COMPREHENSIVE METABOLIC PANEL
ALT: 5 U/L (ref 0–44)
AST: 8 U/L — ABNORMAL LOW (ref 15–41)
Albumin: 1.7 g/dL — ABNORMAL LOW (ref 3.5–5.0)
Alkaline Phosphatase: 33 U/L — ABNORMAL LOW (ref 38–126)
Anion gap: 10 (ref 5–15)
BUN: 7 mg/dL — ABNORMAL LOW (ref 8–23)
CO2: 19 mmol/L — ABNORMAL LOW (ref 22–32)
Calcium: 6.9 mg/dL — ABNORMAL LOW (ref 8.9–10.3)
Chloride: 103 mmol/L (ref 98–111)
Creatinine, Ser: 0.35 mg/dL — ABNORMAL LOW (ref 0.44–1.00)
GFR, Estimated: >60 mL/min (ref >60)
Glucose, Bld: 41 mg/dL — CL (ref 70–99)
Potassium: 3.4 mmol/L — ABNORMAL LOW (ref 3.5–5.1)
Sodium: 132 mmol/L — ABNORMAL LOW (ref 135–145)
Total Bilirubin: 1.2 mg/dL (ref 0.3–1.2)
Total Protein: 4 g/dL — ABNORMAL LOW (ref 6.5–8.1)

## 2022-06-24 LAB — CBC
HCT: 28.2 % — ABNORMAL LOW (ref 36.0–46.0)
Hemoglobin: 9.4 g/dL — ABNORMAL LOW (ref 12.0–15.0)
MCH: 30.9 pg (ref 26.0–34.0)
MCHC: 33.3 g/dL (ref 30.0–36.0)
MCV: 92.8 fL (ref 80.0–100.0)
Platelets: 280 10*3/uL (ref 150–400)
RBC: 3.04 MIL/uL — ABNORMAL LOW (ref 3.87–5.11)
RDW: 13.4 % (ref 11.5–15.5)
WBC: 13.4 10*3/uL — ABNORMAL HIGH (ref 4.0–10.5)
nRBC: 0 % (ref 0.0–0.2)

## 2022-06-24 LAB — GLUCOSE, CAPILLARY
Glucose-Capillary: 71 mg/dL (ref 70–99)
Glucose-Capillary: 140 mg/dL — ABNORMAL HIGH (ref 70–99)
Glucose-Capillary: 99 mg/dL (ref 70–99)

## 2022-06-24 LAB — MAGNESIUM: Magnesium: 1.5 mg/dL — ABNORMAL LOW (ref 1.7–2.4)

## 2022-06-24 MED ORDER — MAGNESIUM SULFATE 2 GM/50ML IV SOLN
2.0000 g | Freq: Once | INTRAVENOUS | Status: AC
  Administered 2022-06-24: 2 g via INTRAVENOUS
  Filled 2022-06-24: qty 50

## 2022-06-24 MED ORDER — POTASSIUM CHLORIDE 10 MEQ/100ML IV SOLN
10.0000 meq | INTRAVENOUS | Status: AC
  Administered 2022-06-24 (×4): 10 meq via INTRAVENOUS
  Filled 2022-06-24 (×4): qty 100

## 2022-06-24 NOTE — Progress Notes (Signed)
   06/24/22 0845  Provider Notification  Provider Name/Title shah  Date Provider Notified 06/24/22  Time Provider Notified 0845  Method of Notification Page  Notification Reason Critical result  Test performed and critical result glucose  Date Critical Result Received 06/24/22  Time Critical Result Received 0845  Provider response In department  Date of Provider Response 06/24/22  Time of Provider Response 0846   MD Manuella Ghazi states to give juice. Patient was given juice. Will check CBG in 15 minutes.

## 2022-06-24 NOTE — Progress Notes (Signed)
Patient ambulated to the bathroom and got SOB. O2 was 94% after sitting down. MD Manuella Ghazi notified.

## 2022-06-24 NOTE — Progress Notes (Signed)
   06/24/22 1100  Vitals  Temp 98.5 F (36.9 C)  Temp Source Oral  BP (!) 92/56  MAP (mmHg) 68  BP Location Left Arm  BP Method Automatic  Patient Position (if appropriate) Lying  Pulse Rate 82  Pulse Rate Source Dinamap  Resp 19  Level of Consciousness  Level of Consciousness Alert  MEWS COLOR  MEWS Score Color Green  Oxygen Therapy  SpO2 98 %  O2 Device Nasal Cannula  O2 Flow Rate (L/min) 3 L/min  PCA/Epidural/Spinal Assessment  Respiratory Pattern Regular;Unlabored  MEWS Score  MEWS Temp 0  MEWS Systolic 1  MEWS Pulse 0  MEWS RR 0  MEWS LOC 0  MEWS Score 1   MD Manuella Ghazi notified

## 2022-06-24 NOTE — Progress Notes (Signed)
patient states that her elbow is numb and was wondering if it could be from the potassium going in.Patient's iv is on that arm, but shows no signs of infiltration. MD Manuella Ghazi notified.

## 2022-06-24 NOTE — Progress Notes (Signed)
PROGRESS NOTE    [name]  RCV:[id number] DOB: [birth year] DOA: 06/21/2022 PCP: Lemmie Evens, MD   Brief Narrative:    [name] is a 76 y.o. female with medical history significant of chronic abdominal pain (RUQ) and chronic pancreatitis with calcifications of the pancreatic head, COPD, weight loss, constipation, GERD, failure to thrive and tobacco use who presents to the emergency department due to abdominal pain which started this morning.  Patient was admitted with acute on chronic pancreatitis and has been started on aggressive IV fluid.  Assessment & Plan:   Principal Problem:   Acute on chronic pancreatitis (HCC) Active Problems:   Tobacco abuse   COPD (chronic obstructive pulmonary disease) (HCC)   GERD   Abdominal pain   Nausea & vomiting   Constipation   Failure to thrive in adult   Hypokalemia   Hypomagnesemia   Leukocytosis   Thrombocytosis   Prolonged QT interval   Dehydration  Assessment and Plan:  Abdominal pain, nausea and vomiting secondary to acute on chronic pancreatitis in the setting of chronic abdominal pain CT abdomen pelvis without contrast was suggestive of acute pancreatitis Elevated lipase, monitor BISAP Score = 1 point Continue IV Compazine p.r.n Continue IV Dilaudid p.r.n for pain Continue Protonix Continue IV LR at 125 mL/h Start clear liquid diet Holding Creon No significant elevation in triglycerides and no significant alcohol use RUQ U/S with no acute findings Discussed case with GI 9/27 with plans to continue current clinical support and consider repeat CT imaging by 48-72 hours if there is lack of improvement  Acute hypoxemic respiratory failure Due to to worsening atelectasis from chronic pain, seen on chest x-ray 9/27 Incentive spirometry ordered Up in chair Wean oxygen as tolerated   Dehydration Continue IV hydration  Hypokalemia/hypomagnesemia Replete and reevaluate   Prolonged QT interval QTc 544  ms Avoid QT prolonging drugs Magnesium and potassium will be replenished Repeat EKG in the morning   Leukocytosis possibly reactive-improving WBC 18.8, this may be due to stress demargination Continue to monitor WBC with morning labs   Thrombocytosis possibly reactive Platelets 539, this may be due to stress demargination Continue to monitor platelet level with morning labs   GERD Continue Protonix   Constipation Continue Amitiza   COPD Continue ventilating, Dulera   Failure to thrive in adult (BMI 13.73) Protein supplement to be provided Dietitian will be consulted and we shall await further recommendation   Tobacco abuse Patient was counseled on tobacco abuse cessation  Hypoglycemia Start with clear liquid diet and see how this is tolerated     DVT prophylaxis:Lovenox Code Status: Full Family Communication: Granddaughter at bedside 9/26 Disposition Plan:  Status is: Inpatient Remains inpatient appropriate because: Need for IV fluid.     Consultants:  None   Procedures:  None   Antimicrobials:  None   Subjective: Patient seen and evaluated today with ongoing abdominal pain rated 7/10.  No nausea or vomiting noted.  Noted to be having some hypoglycemia and worsening hypoxemia today.  Objective: Vitals:   06/23/22 2016 06/23/22 2222 06/24/22 0330 06/24/22 0728  BP:  (!) 96/58 (!) 87/56   Pulse:  90 83   Resp:  20 18   Temp:  98.1 F (36.7 C) 98.2 F (36.8 C)   TempSrc:  Oral Oral   SpO2: (!) 77% 97% 100% 100%  Weight:      Height:       No intake or output data in the 24 hours ending  06/24/22 1007 Filed Weights   06/21/22 1351  Weight: 36.3 kg    Examination:  General exam: Appears calm and comfortable, frail/thin Respiratory system: Clear to auscultation. Respiratory effort normal.  3 L nasal cannula Cardiovascular system: S1 & S2 heard, RRR.  Gastrointestinal system: Abdomen is soft Central nervous system: Alert and awake Extremities:  No edema Skin: No significant lesions noted Psychiatry: Flat affect.    Data Reviewed: I have personally reviewed following labs and imaging studies  CBC: Recent Labs  Lab 06/21/22 1438 06/22/22 0629 06/23/22 0833 06/24/22 0648  WBC 18.8* 23.2* 19.3* 13.4*  NEUTROABS 15.3*  --   --   --   HGB 12.8 12.6 11.4* 9.4*  HCT 37.1 36.7 33.3* 28.2*  MCV 88.8 90.4 91.0 92.8  PLT 539* 528* 385 353   Basic Metabolic Panel: Recent Labs  Lab 06/21/22 1438 06/21/22 1442 06/22/22 0629 06/23/22 0833 06/24/22 0648  NA 138  --  135 133* 132*  K 2.9*  --  3.8 3.5 3.4*  CL 102  --  103 101 103  CO2 23  --  25 25 19*  GLUCOSE 130*  --  96 59* 41*  BUN 23  --  13 7* 7*  CREATININE 0.54  --  0.41* 0.41* 0.35*  CALCIUM 8.8*  --  7.5* 7.4* 6.9*  MG  --  1.5* 2.2 1.9 1.5*  PHOS  --  3.0 2.0*  --   --    GFR: Estimated Creatinine Clearance: 34.8 mL/min (A) (by C-G formula based on SCr of 0.35 mg/dL (L)). Liver Function Tests: Recent Labs  Lab 06/21/22 1438 06/22/22 0629 06/23/22 0833 06/24/22 0648  AST 15 15 11* 8*  ALT '8 8 7 5  '$ ALKPHOS 54 49 44 33*  BILITOT 0.7 0.8 1.1 1.2  PROT 6.3* 5.8* 5.1* 4.0*  ALBUMIN 3.5 3.2* 2.4* 1.7*   Recent Labs  Lab 06/21/22 1438 06/23/22 0833  LIPASE 1,142* 116*   No results for input(s): "AMMONIA" in the last 168 hours. Coagulation Profile: No results for input(s): "INR", "PROTIME" in the last 168 hours. Cardiac Enzymes: No results for input(s): "CKTOTAL", "CKMB", "CKMBINDEX", "TROPONINI" in the last 168 hours. BNP (last 3 results) No results for input(s): "PROBNP" in the last 8760 hours. HbA1C: No results for input(s): "HGBA1C" in the last 72 hours. CBG: Recent Labs  Lab 06/24/22 0922  GLUCAP 71   Lipid Profile: Recent Labs    06/22/22 0629  CHOL 144  HDL 44  LDLCALC 85  TRIG 77  CHOLHDL 3.3   Thyroid Function Tests: No results for input(s): "TSH", "T4TOTAL", "FREET4", "T3FREE", "THYROIDAB" in the last 72 hours. Anemia  Panel: No results for input(s): "VITAMINB12", "FOLATE", "FERRITIN", "TIBC", "IRON", "RETICCTPCT" in the last 72 hours. Sepsis Labs: No results for input(s): "PROCALCITON", "LATICACIDVEN" in the last 168 hours.  No results found for this or any previous visit (from the past 240 hour(s)).       Radiology Studies: DG CHEST PORT 1 VIEW  Result Date: 06/24/2022 CLINICAL DATA:  Hypoxemia. EXAM: PORTABLE CHEST 1 VIEW COMPARISON:  July 10, 2018. FINDINGS: The heart size and mediastinal contours are within normal limits. Emphysematous disease is noted. Hyperexpansion of the lungs is noted. Increased right basilar opacity is noted concerning for worsening atelectasis or pneumonia. The visualized skeletal structures are unremarkable. IMPRESSION: Increased right basilar opacity is noted concerning for worsening atelectasis or pneumonia. Followup PA and lateral chest X-ray is recommended in 3-4 weeks following trial of antibiotic therapy  to ensure resolution and exclude underlying malignancy. Emphysema (ICD10-J43.9). Electronically Signed   By: Marijo Conception M.D.   On: 06/24/2022 09:41   US Abdomen Limited  Result Date: 06/22/2022 CLINICAL DATA:  Abdominal pain, nausea, and vomiting. Findings of acute pancreatitis on recent CT. EXAM: ULTRASOUND ABDOMEN LIMITED RIGHT UPPER QUADRANT COMPARISON:  CT abdomen and pelvis 06/21/2022 FINDINGS: Gallbladder: No gallstones or wall thickening visualized. No sonographic Murphy sign noted by sonographer. Common bile duct: Diameter: 3 mm Liver: No focal lesion identified. Within normal limits in parenchymal echogenicity. Portal vein is patent on color Doppler imaging with normal direction of blood flow towards the liver. Other: Trace perihepatic fluid. IMPRESSION: 1. Trace perihepatic fluid. 2. Normal appearance of the gallbladder.  No biliary dilatation. Electronically Signed   By: Logan Bores M.D.   On: 06/22/2022 11:50        Scheduled Meds:  enoxaparin  (LOVENOX) injection  30 mg Subcutaneous Q24H   feeding supplement  237 mL Oral BID BM   lipase/protease/amylase  48,000 Units Oral TID WC   lubiprostone  24 mcg Oral BID WC   mometasone-formoterol  2 puff Inhalation BID   pantoprazole (PROTONIX) IV  40 mg Intravenous Q12H   Continuous Infusions:  lactated ringers 125 mL/hr at 06/23/22 0322     LOS: 3 days    Time spent: 35 minutes    Mekhai Venuto Darleen Crocker, DO [facility]  If 7PM-7AM, please contact night-coverage [email or web address] 06/24/2022, 10:07 AM

## 2022-06-25 DIAGNOSIS — K861 Other chronic pancreatitis: Secondary | ICD-10-CM | POA: Diagnosis not present

## 2022-06-25 DIAGNOSIS — K859 Acute pancreatitis without necrosis or infection, unspecified: Secondary | ICD-10-CM | POA: Diagnosis not present

## 2022-06-25 MED ORDER — OXYCODONE-ACETAMINOPHEN 5-325 MG PO TABS: 1.0000 | ORAL_TABLET | ORAL | Status: DC | PRN

## 2022-06-25 MED ORDER — CYCLOBENZAPRINE HCL 10 MG PO TABS
5.0000 mg | ORAL_TABLET | Freq: Every day | ORAL | Status: DC
  Administered 2022-06-25: 5 mg via ORAL
  Filled 2022-06-25: qty 1

## 2022-06-25 MED ORDER — ESCITALOPRAM OXALATE 10 MG PO TABS
10.0000 mg | ORAL_TABLET | Freq: Every day | ORAL | Status: DC
  Administered 2022-06-25 – 2022-06-26 (×2): 10 mg via ORAL
  Filled 2022-06-25 (×2): qty 1

## 2022-06-25 MED ORDER — OXYCODONE-ACETAMINOPHEN 10-325 MG PO TABS: 1.0000 | ORAL_TABLET | ORAL | Status: DC | PRN

## 2022-06-25 MED ORDER — DULOXETINE HCL 60 MG PO CPEP
60.0000 mg | ORAL_CAPSULE | Freq: Two times a day (BID) | ORAL | Status: DC
  Administered 2022-06-25 – 2022-06-26 (×3): 60 mg via ORAL
  Filled 2022-06-25 (×3): qty 1

## 2022-06-25 MED ORDER — LUBIPROSTONE 24 MCG PO CAPS
24.0000 ug | ORAL_CAPSULE | Freq: Two times a day (BID) | ORAL | Status: DC
  Administered 2022-06-25 – 2022-06-26 (×2): 24 ug via ORAL
  Filled 2022-06-25 (×2): qty 1

## 2022-06-25 MED ORDER — OXYCODONE HCL 5 MG PO TABS
5.0000 mg | ORAL_TABLET | ORAL | Status: DC | PRN
  Administered 2022-06-25 – 2022-06-26 (×5): 5 mg via ORAL
  Filled 2022-06-25 (×5): qty 1

## 2022-06-25 MED ORDER — PANCRELIPASE (LIP-PROT-AMYL) 12000-38000 UNITS PO CPEP
24000.0000 [IU] | ORAL_CAPSULE | Freq: Three times a day (TID) | ORAL | Status: DC
  Administered 2022-06-25 – 2022-06-26 (×3): 24000 [IU] via ORAL
  Filled 2022-06-25 (×3): qty 2

## 2022-06-25 NOTE — Progress Notes (Signed)
PROGRESS NOTE    ANJELIQUE MAKAR  SAY:[id number] DOB: 18-Feb-1946 DOA: 06/21/2022 PCP: Lemmie Evens, MD   Brief Narrative:    [name] is a 76 y.o. female with medical history significant of chronic abdominal pain (RUQ) and chronic pancreatitis with calcifications of the pancreatic head, COPD, weight loss, constipation, GERD, failure to thrive and tobacco use who presents to the emergency department due to abdominal pain which started this morning.  Patient was admitted with acute on chronic pancreatitis and has been started on aggressive IV fluid.  Assessment & Plan:   Principal Problem:   Acute on chronic pancreatitis (HCC) Active Problems:   Tobacco abuse   COPD (chronic obstructive pulmonary disease) (HCC)   GERD   Abdominal pain   Nausea & vomiting   Constipation   Failure to thrive in adult   Hypokalemia   Hypomagnesemia   Leukocytosis   Thrombocytosis   Prolonged QT interval   Dehydration   Protein-calorie malnutrition, severe  Assessment and Plan:   Abdominal pain, nausea and vomiting secondary to acute on chronic pancreatitis in the setting of chronic abdominal pain CT abdomen pelvis without contrast was suggestive of acute pancreatitis Elevated lipase, monitor BISAP Score = 1 point Continue IV Compazine p.r.n Continue IV Dilaudid p.r.n for pain Continue Protonix Stop IV fluid Advance to full liquid diet today Resume home Creon No significant elevation in triglycerides and no significant alcohol use RUQ U/S with no acute findings Discussed case with GI 9/27 with plans to continue current clinical support and consider repeat CT imaging by 48-72 hours if there is lack of improvement   Acute hypoxemic respiratory failure Due to to worsening atelectasis from chronic pain, seen on chest x-ray 9/27 Incentive spirometry ordered Up in chair Wean oxygen as tolerated   Dehydration Continue IV hydration   Hypokalemia/hypomagnesemia Replete and  reevaluate   Prolonged QT interval QTc 544 ms Avoid QT prolonging drugs Magnesium and potassium will be replenished Repeat EKG in the morning   Leukocytosis possibly reactive-improving WBC 18.8, this may be due to stress demargination Continue to monitor WBC with morning labs   Thrombocytosis possibly reactive Platelets 539, this may be due to stress demargination Continue to monitor platelet level with morning labs   GERD Continue Protonix   Constipation Continue Amitiza   COPD Continue ventilating, Dulera   Failure to thrive in adult (BMI 13.73) Protein supplement to be provided Dietitian will be consulted and we shall await further recommendation   Tobacco abuse Patient was counseled on tobacco abuse cessation   Hypoglycemia Start with clear liquid diet and see how this is tolerated     DVT prophylaxis:Lovenox Code Status: Full Family Communication: Granddaughter at bedside 9/26 Disposition Plan:  Status is: Inpatient Remains inpatient appropriate because: Need for IV fluid.     Consultants:  None   Procedures:  None   Antimicrobials:  None     Subjective: Patient seen and evaluated today with ongoing chronic abdominal pain, but this appears to be a chronic issue for her.  Appears to be tolerating clear liquid diet and would like to be advanced.  Objective: Vitals:   06/24/22 2004 06/24/22 2124 06/25/22 0615 06/25/22 0824  BP:  118/71 98/65   Pulse:  91 85   Resp:  18 18   Temp:  98.8 F (37.1 C) 98.5 F (36.9 C)   TempSrc:      SpO2: 93% 95% 96% 90%  Weight:      Height:  Intake/Output Summary (Last 24 hours) at 06/25/2022 0900 Last data filed at 06/25/2022 0320 Gross per 24 hour  Intake 3480 ml  Output --  Net 3480 ml   Filed Weights   06/21/22 1351  Weight: 36.3 kg    Examination:  General exam: Appears calm and comfortable, thin/frail Respiratory system: Clear to auscultation. Respiratory effort normal. Cardiovascular  system: S1 & S2 heard, RRR.  Gastrointestinal system: Abdomen is soft Central nervous system: Alert and awake Extremities: No edema Skin: No significant lesions noted Psychiatry: Flat affect.    Data Reviewed: I have personally reviewed following labs and imaging studies  CBC: Recent Labs  Lab 06/21/22 1438 06/22/22 0629 06/23/22 0833 06/24/22 0648  WBC 18.8* 23.2* 19.3* 13.4*  NEUTROABS 15.3*  --   --   --   HGB 12.8 12.6 11.4* 9.4*  HCT 37.1 36.7 33.3* 28.2*  MCV 88.8 90.4 91.0 92.8  PLT 539* 528* 385 275   Basic Metabolic Panel: Recent Labs  Lab 06/21/22 1438 06/21/22 1442 06/22/22 0629 06/23/22 0833 06/24/22 0648  NA 138  --  135 133* 132*  K 2.9*  --  3.8 3.5 3.4*  CL 102  --  103 101 103  CO2 23  --  25 25 19*  GLUCOSE 130*  --  96 59* 41*  BUN 23  --  13 7* 7*  CREATININE 0.54  --  0.41* 0.41* 0.35*  CALCIUM 8.8*  --  7.5* 7.4* 6.9*  MG  --  1.5* 2.2 1.9 1.5*  PHOS  --  3.0 2.0*  --   --    GFR: Estimated Creatinine Clearance: 34.8 mL/min (A) (by C-G formula based on SCr of 0.35 mg/dL (L)). Liver Function Tests: Recent Labs  Lab 06/21/22 1438 06/22/22 0629 06/23/22 0833 06/24/22 0648  AST 15 15 11* 8*  ALT '8 8 7 5  '$ ALKPHOS 54 49 44 33*  BILITOT 0.7 0.8 1.1 1.2  PROT 6.3* 5.8* 5.1* 4.0*  ALBUMIN 3.5 3.2* 2.4* 1.7*   Recent Labs  Lab 06/21/22 1438 06/23/22 0833  LIPASE 1,142* 116*   No results for input(s): "AMMONIA" in the last 168 hours. Coagulation Profile: No results for input(s): "INR", "PROTIME" in the last 168 hours. Cardiac Enzymes: No results for input(s): "CKTOTAL", "CKMB", "CKMBINDEX", "TROPONINI" in the last 168 hours. BNP (last 3 results) No results for input(s): "PROBNP" in the last 8760 hours. HbA1C: No results for input(s): "HGBA1C" in the last 72 hours. CBG: Recent Labs  Lab 06/24/22 0922 06/24/22 1123 06/24/22 1622  GLUCAP 71 140* 99   Lipid Profile: No results for input(s): "CHOL", "HDL", "LDLCALC", "TRIG",  "CHOLHDL", "LDLDIRECT" in the last 72 hours. Thyroid Function Tests: No results for input(s): "TSH", "T4TOTAL", "FREET4", "T3FREE", "THYROIDAB" in the last 72 hours. Anemia Panel: No results for input(s): "VITAMINB12", "FOLATE", "FERRITIN", "TIBC", "IRON", "RETICCTPCT" in the last 72 hours. Sepsis Labs: No results for input(s): "PROCALCITON", "LATICACIDVEN" in the last 168 hours.  No results found for this or any previous visit (from the past 240 hour(s)).       Radiology Studies: DG CHEST PORT 1 VIEW  Result Date: 06/24/2022 CLINICAL DATA:  Hypoxemia. EXAM: PORTABLE CHEST 1 VIEW COMPARISON:  July 10, 2018. FINDINGS: The heart size and mediastinal contours are within normal limits. Emphysematous disease is noted. Hyperexpansion of the lungs is noted. Increased right basilar opacity is noted concerning for worsening atelectasis or pneumonia. The visualized skeletal structures are unremarkable. IMPRESSION: Increased right basilar opacity is noted concerning  for worsening atelectasis or pneumonia. Followup PA and lateral chest X-ray is recommended in 3-4 weeks following trial of antibiotic therapy to ensure resolution and exclude underlying malignancy. Emphysema (ICD10-J43.9). Electronically Signed   By: Marijo Conception M.D.   On: 06/24/2022 09:41        Scheduled Meds:  enoxaparin (LOVENOX) injection  30 mg Subcutaneous Q24H   feeding supplement  237 mL Oral BID BM   mometasone-formoterol  2 puff Inhalation BID   pantoprazole (PROTONIX) IV  40 mg Intravenous Q12H     LOS: 4 days    Time spent: 35 minutes    Zacarias Krauter Darleen Crocker, DO [facility]  If 7PM-7AM, please contact night-coverage [email or web address] 06/25/2022, 9:00 AM

## 2022-06-26 DIAGNOSIS — K859 Acute pancreatitis without necrosis or infection, unspecified: Secondary | ICD-10-CM | POA: Diagnosis not present

## 2022-06-26 DIAGNOSIS — K861 Other chronic pancreatitis: Secondary | ICD-10-CM | POA: Diagnosis not present

## 2022-06-26 LAB — CBC
HCT: 30.2 % — ABNORMAL LOW (ref 36.0–46.0)
Hemoglobin: 10.4 g/dL — ABNORMAL LOW (ref 12.0–15.0)
MCH: 31 pg (ref 26.0–34.0)
MCHC: 34.4 g/dL (ref 30.0–36.0)
MCV: 89.9 fL (ref 80.0–100.0)
Platelets: 351 10*3/uL (ref 150–400)
RBC: 3.36 MIL/uL — ABNORMAL LOW (ref 3.87–5.11)
RDW: 13.2 % (ref 11.5–15.5)
WBC: 8.7 10*3/uL (ref 4.0–10.5)
nRBC: 0 % (ref 0.0–0.2)

## 2022-06-26 LAB — COMPREHENSIVE METABOLIC PANEL
ALT: 8 U/L (ref 0–44)
AST: 15 U/L (ref 15–41)
Albumin: 2 g/dL — ABNORMAL LOW (ref 3.5–5.0)
Alkaline Phosphatase: 47 U/L (ref 38–126)
Anion gap: 4 — ABNORMAL LOW (ref 5–15)
BUN: <5 mg/dL — ABNORMAL LOW (ref 8–23)
CO2: 30 mmol/L (ref 22–32)
Calcium: 7.4 mg/dL — ABNORMAL LOW (ref 8.9–10.3)
Chloride: 100 mmol/L (ref 98–111)
Creatinine, Ser: 0.33 mg/dL — ABNORMAL LOW (ref 0.44–1.00)
GFR, Estimated: >60 mL/min (ref >60)
Glucose, Bld: 89 mg/dL (ref 70–99)
Potassium: 2.8 mmol/L — ABNORMAL LOW (ref 3.5–5.1)
Sodium: 134 mmol/L — ABNORMAL LOW (ref 135–145)
Total Bilirubin: 0.6 mg/dL (ref 0.3–1.2)
Total Protein: 4.8 g/dL — ABNORMAL LOW (ref 6.5–8.1)

## 2022-06-26 LAB — MAGNESIUM: Magnesium: 1.6 mg/dL — ABNORMAL LOW (ref 1.7–2.4)

## 2022-06-26 MED ORDER — ONDANSETRON HCL 4 MG PO TABS: 4.0000 mg | ORAL_TABLET | Freq: Every day | ORAL | 1 refills | Status: AC | PRN

## 2022-06-26 MED ORDER — ENSURE ENLIVE PO LIQD: 237.0000 mL | Freq: Two times a day (BID) | ORAL | 12 refills | Status: DC

## 2022-06-26 NOTE — Progress Notes (Signed)
Pt sitting on side of bed ra and 85% placed back on 3L [place] and ambulated approx 40 feet and sats down to 82%. Pt back to bed on her 3L , took several minutes for pt to obtain 87% with resting and 02 at 3L. Dr Manuella Ghazi aware

## 2022-06-26 NOTE — TOC Transition Note (Signed)
Transition of Care [facility]) - CM/SW Discharge Note   Patient Details  Name: [name] MRN: [id number] Date of Birth: 1946-08-06  [facility] CM/SW Contact:  Boneta Lucks, RN Phone Number: 06/26/2022, 11:40 AM   Clinical Narrative:   Patient discharging home. Needing home oxygen. Choices given to sister. [employer] given the referral. [name] sister is on her way to transport home.  Orders and note have been placed.    Final next level of care: Home/Self Care Barriers to Discharge: Barriers Resolved   Patient Goals and CMS Choice Patient states their goals for this hospitalization and ongoing recovery are:: to go home CMS Medicare.gov Compare Post Acute Care list provided to:: Patient Represenative (must comment) Choice offered to / list presented to : Sibling  Discharge Placement               Patient to be transferred to facility by: sister Name of family member notified: [name] Patient and family notified of of transfer: 06/26/22  Discharge Plan and Services               DME Arranged: Oxygen DME Agency: [facility] Date DME Agency Contacted: 06/26/22 Time DME Agency Contacted: [id number] Representative spoke with at DME Agency: Ashly       Readmission Risk Interventions    06/26/2022   11:38 AM  Readmission Risk Prevention Plan  Transportation Screening Complete  PCP or Specialist Appt within 5-7 Days Complete  Home Care Screening Complete  Medication Review (RN CM) Complete

## 2022-06-26 NOTE — Progress Notes (Signed)
SATURATION QUALIFICATIONS:  Patient Saturations on Room Air at Rest = 85%  Patient Saturations on Room Air while Ambulating = 82%  Patient Saturations on 3 Liters of oxygen while Ambulating = 87%  Pt needs oxygen d/t destat.

## 2022-06-26 NOTE — Progress Notes (Signed)
SATURATION QUALIFICATIONS: (This note is used to comply with regulatory documentation for home oxygen)  Patient Saturations on Room Air at Rest = 85%  Patient Saturations on Room Air while Ambulating = na  Patient Saturations on 3 Liters of oxygen while Ambulating = 82%  Please briefly explain why patient needs home oxygen: Pt sat back down and needed several minutes of rest on 02 3L [place] before sats would remain at 87%

## 2022-06-26 NOTE — Discharge Summary (Signed)
Physician Discharge Summary  [name] HDQ:[id number] DOB: 04/02/1946 DOA: 06/21/2022  PCP: Lemmie Evens, MD  Admit date: 06/21/2022  Discharge date: 06/26/2022  Admitted From:Home  Disposition:  Home  Recommendations for Outpatient Follow-up:  Follow up with PCP in 1-2 weeks Continue on home medications as prior  Home Health: None  Equipment/Devices: 3 L nasal cannula oxygen  Discharge Condition:Stable  CODE STATUS: Full  Diet recommendation: Full diet and advance to soft diet/low-fat  Brief/Interim Summary: [name] is a 76 y.o. female with medical history significant of chronic abdominal pain (RUQ) and chronic pancreatitis with calcifications of the pancreatic head, COPD, weight loss, constipation, GERD, failure to thrive and tobacco use who presents to the emergency department due to abdominal pain which started this morning.  Patient was admitted with acute on chronic pancreatitis and has been started on aggressive IV fluid.  She continues to have her ongoing chronic abdominal pain, but states that some of the intensity has improved considerably and she has no further nausea or vomiting.  She is tolerating full liquid diet and appears stable for discharge today.  She has required 3 L nasal cannula during the course of the stay on account of the fact that she developed some atelectasis due to her abdominal pain.  She will be discharged on some home oxygen as well.  Discharge Diagnoses:  Principal Problem:   Acute on chronic pancreatitis (HCC) Active Problems:   Tobacco abuse   COPD (chronic obstructive pulmonary disease) (HCC)   GERD   Abdominal pain   Nausea & vomiting   Constipation   Failure to thrive in adult   Hypokalemia   Hypomagnesemia   Leukocytosis   Thrombocytosis   Prolonged QT interval   Dehydration   Protein-calorie malnutrition, severe  Plan we will discharge diagnosis: Acute on chronic pancreatitis.  Acute hypoxemic respiratory  failure secondary to atelectasis in the setting of abdominal pain.  Discharge Instructions  Discharge Instructions     Diet - low sodium heart healthy   Complete by: As directed    Increase activity slowly   Complete by: As directed       Allergies as of 06/26/2022       Reactions   Ibuprofen Anaphylaxis, Hives   Iohexol Hives, Swelling   HIVES AND SWELLING WITH I.V.P DYE, NEEDS PRE-MEDS.   Other Itching   Strawberry (diagnostic) Itching   Strawberry Extract Itching        Medication List     STOP taking these medications    doxycycline 100 MG capsule Commonly known as: VIBRAMYCIN       TAKE these medications    albuterol 108 (90 Base) MCG/ACT inhaler Commonly known as: VENTOLIN HFA Inhale 2 puffs into the lungs every 4 (four) hours as needed for wheezing or shortness of breath.   CALCIUM 600 PO   cilostazol 100 MG tablet Commonly known as: PLETAL Take 100 mg by mouth 2 (two) times daily.   Creon 24000-76000 units Cpep Generic drug: Pancrelipase (Lip-Prot-Amyl) Take 2 capsules with meals three times daily. Take 1 capsule with snacks. 8 total capsules per day.   cyclobenzaprine 5 MG tablet Commonly known as: FLEXERIL Take 5 mg by mouth at bedtime.   DULoxetine 60 MG capsule Commonly known as: CYMBALTA Take 60 mg by mouth 2 (two) times daily.   escitalopram 10 MG tablet Commonly known as: LEXAPRO Take 10 mg by mouth daily.   feeding supplement Liqd Take 237 mLs by mouth 2 (two)  times daily between meals.   Fluticasone-Salmeterol 250-50 MCG/DOSE Aepb Commonly known as: ADVAIR Inhale 1 puff into the lungs as needed.   hydrOXYzine 25 MG tablet Commonly known as: ATARAX Take 1 tablet by mouth as needed for anxiety or itching.   LORazepam 1 MG tablet Commonly known as: ATIVAN Take 1 mg by mouth every 8 (eight) hours as needed for anxiety. Reported on 11/19/2015   lubiprostone 24 MCG capsule Commonly known as: AMITIZA TAKE 1 CAPSULE BY MOUTH  TWICE DAILY WITH A MEAL.   mupirocin cream 2 % Commonly known as: BACTROBAN Apply 1 Application topically 2 (two) times daily.   ondansetron 4 MG tablet Commonly known as: Zofran Take 1 tablet (4 mg total) by mouth daily as needed for nausea or vomiting.   oxyCODONE-acetaminophen 10-325 MG tablet Commonly known as: PERCOCET Take 1 tablet by mouth as needed.   pantoprazole 40 MG tablet Commonly known as: Protonix Take 1 tablet (40 mg total) by mouth 2 (two) times daily before a meal.   potassium chloride SA 20 MEQ tablet Commonly known as: KLOR-CON M Take 20 mEq by mouth daily.   Prolia 60 MG/ML Sosy injection Generic drug: denosumab Inject 60 mg into the skin every 6 (six) months.   promethazine 25 MG tablet Commonly known as: PHENERGAN Take 25 mg by mouth as needed for nausea or vomiting.   zolpidem 10 MG tablet Commonly known as: AMBIEN Take 10 mg by mouth at bedtime.               Durable Medical Equipment  (From admission, onward)           Start     Ordered   06/26/22 1145  For home use only DME oxygen  Once       Question Answer Comment  Length of Need 12 Months   Mode or (Route) Nasal cannula   Liters per Minute 3   Frequency Continuous (stationary and portable oxygen unit needed)   Oxygen conserving device Yes   Oxygen delivery system Gas      06/26/22 1144            Follow-up Information     Lemmie Evens, MD. Schedule an appointment as soon as possible for a visit in 1 week(s).   Specialty: Family Medicine Contact information: [place] [address] [phone]         [place] Follow up.   Why: Home oxygen Contact information: [place] [address] [phone]                Allergies  Allergen Reactions   Ibuprofen Anaphylaxis and Hives   Iohexol Hives and Swelling    HIVES AND SWELLING WITH I.V.P DYE, NEEDS PRE-MEDS.    Other Itching   Strawberry (Diagnostic)  Itching   Strawberry Extract Itching    Consultations: None   Procedures/Studies: DG CHEST PORT 1 VIEW  Result Date: 06/24/2022 CLINICAL DATA:  Hypoxemia. EXAM: PORTABLE CHEST 1 VIEW COMPARISON:  July 10, 2018. FINDINGS: The heart size and mediastinal contours are within normal limits. Emphysematous disease is noted. Hyperexpansion of the lungs is noted. Increased right basilar opacity is noted concerning for worsening atelectasis or pneumonia. The visualized skeletal structures are unremarkable. IMPRESSION: Increased right basilar opacity is noted concerning for worsening atelectasis or pneumonia. Followup PA and lateral chest X-ray is recommended in 3-4 weeks following trial of antibiotic therapy to ensure resolution and exclude underlying malignancy. Emphysema (ICD10-J43.9). Electronically  Signed   By: Marijo Conception M.D.   On: 06/24/2022 09:41   US Abdomen Limited  Result Date: 06/22/2022 CLINICAL DATA:  Abdominal pain, nausea, and vomiting. Findings of acute pancreatitis on recent CT. EXAM: ULTRASOUND ABDOMEN LIMITED RIGHT UPPER QUADRANT COMPARISON:  CT abdomen and pelvis 06/21/2022 FINDINGS: Gallbladder: No gallstones or wall thickening visualized. No sonographic Murphy sign noted by sonographer. Common bile duct: Diameter: 3 mm Liver: No focal lesion identified. Within normal limits in parenchymal echogenicity. Portal vein is patent on color Doppler imaging with normal direction of blood flow towards the liver. Other: Trace perihepatic fluid. IMPRESSION: 1. Trace perihepatic fluid. 2. Normal appearance of the gallbladder.  No biliary dilatation. Electronically Signed   By: Logan Bores M.D.   On: 06/22/2022 11:50   CT ABDOMEN PELVIS WO CONTRAST  Result Date: 06/21/2022 CLINICAL DATA:  Abdominal pain EXAM: CT ABDOMEN AND PELVIS WITHOUT CONTRAST TECHNIQUE: Multidetector CT imaging of the abdomen and pelvis was performed following the standard protocol without IV contrast. RADIATION DOSE  REDUCTION: This exam was performed according to the departmental dose-optimization program which includes automated exposure control, adjustment of the mA and/or kV according to patient size and/or use of iterative reconstruction technique. COMPARISON:  04/08/2019 FINDINGS: Lower chest: Emphysematous changes.  No acute abnormality. Hepatobiliary: No focal hepatic abnormality. Gallbladder unremarkable. Pancreas: There is stranding around the pancreas. Pancreatic head appears prominent. Findings suggestive of acute pancreatitis. Calcifications in the pancreatic head compatible chronic pancreatitis. No ductal dilatation. Spleen: No focal abnormality.  Normal size. Adrenals/Urinary Tract: No adrenal abnormality. No focal renal abnormality. No stones or hydronephrosis. Urinary bladder is unremarkable. Stomach/Bowel: Inflammation/stranding around the duodenum likely related to pancreatitis. Mild gaseous distention of the transverse colon might reflect focal ileus. Moderate stool burden in the colon. No evidence of bowel obstruction. Vascular/Lymphatic: Aortic atherosclerosis. No evidence of aneurysm or adenopathy. Reproductive: Prior hysterectomy.  No adnexal masses. Other: Small amount of free fluid in the abdomen and pelvis. Musculoskeletal: No acute bony abnormality. IMPRESSION: Inflammation/stranding around the pancreas. Enlargement pancreatic sq. Findings most compatible with acute pancreatitis. Adjacent inflammation surrounding the duodenum. Calcifications in the pancreatic head compatible chronic pancreatitis. Small amount of free fluid in the abdomen and pelvis. Aortic atherosclerosis. Electronically Signed   By: Rolm Baptise M.D.   On: 06/21/2022 18:35     Discharge Exam: Vitals:   06/26/22 0415 06/26/22 0719  BP: (!) 108/59   Pulse: 85   Resp: 19   Temp: 98.8 F (37.1 C)   SpO2: 90% 91%   Vitals:   06/25/22 1921 06/25/22 2113 06/26/22 0415 06/26/22 0719  BP:  107/63 (!) 108/59   Pulse:  87 85    Resp:  16 19   Temp:  98.4 F (36.9 C) 98.8 F (37.1 C)   TempSrc:  Oral    SpO2: 92% 92% 90% 91%  Weight:      Height:        General: Pt is alert, awake, not in acute distress Cardiovascular: RRR, S1/S2 +, no rubs, no gallops Respiratory: CTA bilaterally, no wheezing, no rhonchi Abdominal: Soft, NT, ND, bowel sounds + Extremities: no edema, no cyanosis    The results of significant diagnostics from this hospitalization (including imaging, microbiology, ancillary and laboratory) are listed below for reference.     Microbiology: No results found for this or any previous visit (from the past 240 hour(s)).   Labs: BNP (last 3 results) No results for input(s): "BNP" in the last 8760 hours. Basic  Metabolic Panel: Recent Labs  Lab 06/21/22 1438 06/21/22 1442 06/22/22 0629 06/23/22 0833 06/24/22 0648 06/26/22 0437  NA 138  --  135 133* 132* 134*  K 2.9*  --  3.8 3.5 3.4* 2.8*  CL 102  --  103 101 103 100  CO2 23  --  25 25 19* 30  GLUCOSE 130*  --  96 59* 41* 89  BUN 23  --  13 7* 7* <5*  CREATININE 0.54  --  0.41* 0.41* 0.35* 0.33*  CALCIUM 8.8*  --  7.5* 7.4* 6.9* 7.4*  MG  --  1.5* 2.2 1.9 1.5* 1.6*  PHOS  --  3.0 2.0*  --   --   --    Liver Function Tests: Recent Labs  Lab 06/21/22 1438 06/22/22 0629 06/23/22 0833 06/24/22 0648 06/26/22 0437  AST 15 15 11* 8* 15  ALT '8 8 7 5 8  '$ ALKPHOS 54 49 44 33* 47  BILITOT 0.7 0.8 1.1 1.2 0.6  PROT 6.3* 5.8* 5.1* 4.0* 4.8*  ALBUMIN 3.5 3.2* 2.4* 1.7* 2.0*   Recent Labs  Lab 06/21/22 1438 06/23/22 0833  LIPASE 1,142* 116*   No results for input(s): "AMMONIA" in the last 168 hours. CBC: Recent Labs  Lab 06/21/22 1438 06/22/22 0629 06/23/22 0833 06/24/22 0648 06/26/22 0437  WBC 18.8* 23.2* 19.3* 13.4* 8.7  NEUTROABS 15.3*  --   --   --   --   HGB 12.8 12.6 11.4* 9.4* 10.4*  HCT 37.1 36.7 33.3* 28.2* 30.2*  MCV 88.8 90.4 91.0 92.8 89.9  PLT 539* 528* 385 280 351   Cardiac Enzymes: No results for  input(s): "CKTOTAL", "CKMB", "CKMBINDEX", "TROPONINI" in the last 168 hours. BNP: Invalid input(s): "POCBNP" CBG: Recent Labs  Lab 06/24/22 0922 06/24/22 1123 06/24/22 1622  GLUCAP 71 140* 99   D-Dimer No results for input(s): "DDIMER" in the last 72 hours. Hgb A1c No results for input(s): "HGBA1C" in the last 72 hours. Lipid Profile No results for input(s): "CHOL", "HDL", "LDLCALC", "TRIG", "CHOLHDL", "LDLDIRECT" in the last 72 hours. Thyroid function studies No results for input(s): "TSH", "T4TOTAL", "T3FREE", "THYROIDAB" in the last 72 hours.  Invalid input(s): "FREET3" Anemia work up No results for input(s): "VITAMINB12", "FOLATE", "FERRITIN", "TIBC", "IRON", "RETICCTPCT" in the last 72 hours. Urinalysis    Component Value Date/Time   COLORURINE YELLOW 06/21/2022 0520   APPEARANCEUR CLEAR 06/21/2022 0520   LABSPEC 1.016 06/21/2022 0520   PHURINE 6.0 06/21/2022 0520   GLUCOSEU NEGATIVE 06/21/2022 0520   HGBUR MODERATE (A) 06/21/2022 0520   BILIRUBINUR NEGATIVE 06/21/2022 0520   KETONESUR 5 (A) 06/21/2022 0520   PROTEINUR NEGATIVE 06/21/2022 0520   UROBILINOGEN 4.0 (H) 03/09/2014 2351   NITRITE NEGATIVE 06/21/2022 0520   LEUKOCYTESUR NEGATIVE 06/21/2022 0520   Sepsis Labs Recent Labs  Lab 06/22/22 0629 06/23/22 0833 06/24/22 0648 06/26/22 0437  WBC 23.2* 19.3* 13.4* 8.7   Microbiology No results found for this or any previous visit (from the past 240 hour(s)).   Time coordinating discharge: 35 minutes  SIGNED:   Rodena Goldmann, DO [facility] 06/26/2022, 12:01 PM  If 7PM-7AM, please contact night-coverage [email or web address]

## 2022-06-26 NOTE — Care Management Important Message (Signed)
Important Message  Patient Details  Name: [name] MRN: [id number] Date of Birth: April 30, 1946   Medicare Important Message Given:  Yes     Tommy Medal 06/26/2022, 11:23 AM

## 2022-08-27 ENCOUNTER — Other Ambulatory Visit: Payer: Self-pay | Admitting: Gastroenterology

## 2022-10-09 ENCOUNTER — Other Ambulatory Visit (HOSPITAL_COMMUNITY): Payer: Self-pay | Admitting: Family Medicine

## 2022-10-09 DIAGNOSIS — Z780 Asymptomatic menopausal state: Secondary | ICD-10-CM

## 2022-10-27 ENCOUNTER — Other Ambulatory Visit (HOSPITAL_COMMUNITY): Payer: Medicare Other

## 2022-11-09 ENCOUNTER — Other Ambulatory Visit (HOSPITAL_COMMUNITY): Payer: Self-pay

## 2022-11-09 ENCOUNTER — Other Ambulatory Visit: Payer: Self-pay | Admitting: Gastroenterology

## 2022-11-13 ENCOUNTER — Other Ambulatory Visit (HOSPITAL_COMMUNITY): Payer: Self-pay

## 2022-12-11 ENCOUNTER — Ambulatory Visit (HOSPITAL_COMMUNITY)
Admission: RE | Admit: 2022-12-11 | Discharge: 2022-12-11 | Disposition: A | Discharge status: Home / Self Care | Payer: 59 | Source: Ambulatory Visit | Attending: Family Medicine | Admitting: Family Medicine

## 2022-12-11 DIAGNOSIS — Z780 Asymptomatic menopausal state: Secondary | ICD-10-CM | POA: Insufficient documentation

## 2023-02-23 ENCOUNTER — Other Ambulatory Visit: Payer: Self-pay | Admitting: Gastroenterology

## 2023-02-25 NOTE — Telephone Encounter (Signed)
Refilled but needs OV.

## 2023-03-23 ENCOUNTER — Ambulatory Visit: Payer: 59 | Admitting: Gastroenterology

## 2023-06-25 ENCOUNTER — Emergency Department (HOSPITAL_COMMUNITY)
Admission: EM | Admit: 2023-06-25 | Discharge: 2023-06-26 | Disposition: A | Discharge status: Home / Self Care | Payer: 59 | Attending: Emergency Medicine | Admitting: Emergency Medicine

## 2023-06-25 ENCOUNTER — Other Ambulatory Visit: Payer: Self-pay

## 2023-06-25 ENCOUNTER — Encounter (HOSPITAL_COMMUNITY): Payer: Self-pay

## 2023-06-25 ENCOUNTER — Emergency Department (HOSPITAL_COMMUNITY): Payer: 59

## 2023-06-25 DIAGNOSIS — Z7951 Long term (current) use of inhaled steroids: Secondary | ICD-10-CM | POA: Insufficient documentation

## 2023-06-25 DIAGNOSIS — J449 Chronic obstructive pulmonary disease, unspecified: Secondary | ICD-10-CM | POA: Diagnosis not present

## 2023-06-25 DIAGNOSIS — R0789 Other chest pain: Secondary | ICD-10-CM | POA: Diagnosis present

## 2023-06-25 LAB — BASIC METABOLIC PANEL
Anion gap: 9 (ref 5–15)
BUN: 9 mg/dL (ref 8–23)
CO2: 28 mmol/L (ref 22–32)
Calcium: 8.8 mg/dL — ABNORMAL LOW (ref 8.9–10.3)
Chloride: 100 mmol/L (ref 98–111)
Creatinine, Ser: 0.48 mg/dL (ref 0.44–1.00)
GFR, Estimated: >60 mL/min (ref >60)
Glucose, Bld: 91 mg/dL (ref 70–99)
Potassium: 3.2 mmol/L — ABNORMAL LOW (ref 3.5–5.1)
Sodium: 137 mmol/L (ref 135–145)

## 2023-06-25 LAB — CBC
HCT: 39.9 % (ref 36.0–46.0)
Hemoglobin: 13.5 g/dL (ref 12.0–15.0)
MCH: 30.9 pg (ref 26.0–34.0)
MCHC: 33.8 g/dL (ref 30.0–36.0)
MCV: 91.3 fL (ref 80.0–100.0)
Platelets: 296 10*3/uL (ref 150–400)
RBC: 4.37 MIL/uL (ref 3.87–5.11)
RDW: 13.7 % (ref 11.5–15.5)
WBC: 7.2 10*3/uL (ref 4.0–10.5)
nRBC: 0 % (ref 0.0–0.2)

## 2023-06-25 LAB — TROPONIN I (HIGH SENSITIVITY): Troponin I (High Sensitivity): 2 ng/L (ref <18)

## 2023-06-25 NOTE — ED Triage Notes (Signed)
Pt presents to ED with right sided chest pain radiating around rib cage to back. Pt has hx of COPD, says she has oxygen at home, but hasn't had to use it in 5 months. Pt says she is always a little sob, but the chest pain is new. Pt ambulatory from WR to room 12.

## 2023-06-26 LAB — TROPONIN I (HIGH SENSITIVITY): Troponin I (High Sensitivity): 2 ng/L (ref <18)

## 2023-06-26 NOTE — ED Provider Notes (Signed)
[place] EMERGENCY DEPARTMENT AT [facility] Provider Note   CSN: [id number] Arrival date & time: 06/25/23  2046     History  Chief Complaint  Patient presents with   Chest Pain    [name] is a 77 y.o. female.  Patient is a 77 year old female with history of COPD.  Patient presenting today with complaints of chest discomfort.  This started this morning when she woke from sleep.  She describes a sharp pain to the right side of her chest that is worse when she moves or breathes.  She denies any cough.  No fevers or chills.  She denies any nausea, diaphoresis, or radiation to the arm or jaw.  The history is provided by the patient.       Home Medications Prior to Admission medications   Medication Sig Start Date End Date Taking? Authorizing Provider  acetaminophen (TYLENOL) 325 MG tablet Take 325 mg by mouth every 6 (six) hours as needed for mild pain. 08/31/17  Yes [provider]  albuterol (PROVENTIL HFA;VENTOLIN HFA) 108 (90 Base) MCG/ACT inhaler Inhale 2 puffs into the lungs every 4 (four) hours as needed for wheezing or shortness of breath. 02/12/18  Yes Samuel Jester, DO  Calcium Carbonate (CALCIUM 600 PO)    Yes [provider]  cilostazol (PLETAL) 100 MG tablet Take 100 mg by mouth 2 (two) times daily.  03/29/15  Yes [provider]  cyclobenzaprine (FLEXERIL) 5 MG tablet Take 5 mg by mouth at bedtime. 05/31/18  Yes [provider]  cycloSPORINE (RESTASIS) 0.05 % ophthalmic emulsion Place 1 drop into both eyes 2 (two) times daily. 03/27/16  Yes [provider]  Docusate Sodium (DSS) 100 MG CAPS Take 100 mg by mouth daily as needed (constipation). 01/03/21  Yes [provider]  DULoxetine (CYMBALTA) 60 MG capsule Take 60 mg by mouth 2 (two) times daily.    Yes [provider]  escitalopram (LEXAPRO) 10 MG tablet Take 10 mg by mouth daily. 01/30/17  Yes [provider]  ezetimibe (ZETIA) 10  MG tablet Take 10 mg by mouth daily. 06/11/23  Yes [provider]  feeding supplement (ENSURE ENLIVE / ENSURE PLUS) LIQD Take 237 mLs by mouth 2 (two) times daily between meals. 06/26/22  Yes Shah, Pratik D, DO  fluticasone (FLONASE) 50 MCG/ACT nasal spray Place 2 sprays into both nostrils daily. 01/13/23  Yes [provider]  Fluticasone-Salmeterol (ADVAIR) 250-50 MCG/DOSE AEPB Inhale 1 puff into the lungs as needed.    Yes [provider]  hydrOXYzine (ATARAX/VISTARIL) 25 MG tablet Take 1 tablet by mouth as needed for anxiety or itching.  04/21/16  Yes [provider]  linaclotide (LINZESS) 145 MCG CAPS capsule Take 145 mcg by mouth daily before breakfast. 04/16/17  Yes [provider]  LORazepam (ATIVAN) 1 MG tablet Take 1 mg by mouth every 8 (eight) hours as needed for anxiety. Reported on 11/19/2015   Yes [provider]  lovastatin (MEVACOR) 10 MG tablet Take 10 mg by mouth daily. 06/23/23  Yes [provider]  lubiprostone (AMITIZA) 24 MCG capsule TAKE 1 CAPSULE BY MOUTH TWICE DAILY WITH A MEAL. 02/25/23  Yes Gelene Mink, NP  mupirocin cream (BACTROBAN) 2 % Apply 1 Application topically 2 (two) times daily. 06/15/22  Yes Eber Hong, MD  ondansetron [facility]) 4 MG tablet Take 1 tablet (4 mg total) by mouth daily as needed for nausea or vomiting. 06/26/22 06/26/23 Yes Sherryll Burger, Pratik D, DO  oxyCODONE-acetaminophen (PERCOCET) 10-325 MG tablet Take 1 tablet by mouth as needed. 12/27/20  Yes [provider]  Pancrelipase, Lip-Prot-Amyl, (CREON) 24000-76000 units CPEP TAKE 2 CAPSULES WITH EACH MEALS AND 1 CAPSULE WITH SNACKS. MAX 2 SNACKS PER DAY. 11/10/22  Yes Gelene Mink, NP  pantoprazole (PROTONIX) 40 MG tablet Take 1 tablet (40 mg total) by mouth 2 (two) times daily before a meal. 07/16/21  Yes Gelene Mink, NP  potassium chloride SA (K-DUR,KLOR-CON) 20 MEQ tablet Take 20 mEq by mouth daily.   Yes [provider]  PROLIA 60  MG/ML SOSY injection Inject 60 mg into the skin every 6 (six) months.  05/31/18  Yes [provider]  promethazine (PHENERGAN) 25 MG tablet Take 25 mg by mouth as needed for nausea or vomiting.    Yes [provider]  Vitamin D, Ergocalciferol, (DRISDOL) 1.25 MG (50000 UNIT) CAPS capsule Take 50,000 Units by mouth every 7 (seven) days. 08/31/17  Yes [provider]  potassium chloride (KLOR-CON) 10 MEQ tablet Take 10 mEq by mouth daily. 06/23/23   [provider]      Allergies    Ibuprofen, Iohexol, Other, Strawberry (diagnostic), and Strawberry extract    Review of Systems   Review of Systems  All other systems reviewed and are negative.   Physical Exam Updated Vital Signs BP 124/79   Pulse 74   Temp 97.9 F (36.6 C) (Oral)   Resp 18   Ht 5\' 4"  (1.626 m)   Wt 36.3 kg   SpO2 93%   BMI 13.74 kg/m  Physical Exam Vitals and nursing note reviewed.  Constitutional:      General: She is not in acute distress.    Appearance: She is well-developed. She is not diaphoretic.  HENT:     Head: Normocephalic and atraumatic.  Cardiovascular:     Rate and Rhythm: Normal rate and regular rhythm.     Heart sounds: No murmur heard.    No friction rub. No gallop.  Pulmonary:     Effort: Pulmonary effort is normal. No respiratory distress.     Breath sounds: Normal breath sounds. No wheezing.     Comments: There is tenderness to palpation of the right lateral chest wall.  This reproduces her symptoms.  There is no crepitus or palpable abnormality. Abdominal:     General: Bowel sounds are normal. There is no distension.     Palpations: Abdomen is soft.     Tenderness: There is no abdominal tenderness.  Musculoskeletal:        General: Normal range of motion.     Cervical back: Normal range of motion and neck supple.  Skin:    General: Skin is warm and dry.  Neurological:     General: No focal deficit present.     Mental Status: She is alert and oriented  to person, place, and time.     ED Results / Procedures / Treatments   Labs (all labs ordered are listed, but only abnormal results are displayed) Labs Reviewed  BASIC METABOLIC PANEL - Abnormal; Notable for the following components:      Result Value   Potassium 3.2 (*)    Calcium 8.8 (*)    All other components within normal limits  CBC  TROPONIN I (HIGH SENSITIVITY)  TROPONIN I (HIGH SENSITIVITY)    EKG EKG Interpretation Date/Time:  Friday June 25 2023 21:28:38 EDT Ventricular Rate:  80 PR Interval:  156 QRS Duration:  102 QT  Interval:  387 QTC Calculation: 447 R Axis:   57  Text Interpretation: Sinus rhythm Anteroseptal infarct, old No significant change since 06/21/2022 Confirmed by Geoffery Lyons ([id number]) on 06/26/2023 12:34:27 AM  Radiology DG Chest 2 View  Result Date: 06/25/2023 CLINICAL DATA:  Chest pain EXAM: CHEST - 2 VIEW COMPARISON:  06/24/2022 FINDINGS: Severe emphysema. Heart and mediastinal contours are within normal limits. No focal opacities or effusions. No acute bony abnormality. IMPRESSION: Severe emphysema. No active cardiopulmonary disease. Electronically Signed   By: Charlett Nose M.D.   On: 06/25/2023 22:32    Procedures Procedures    Medications Ordered in ED Medications - No data to display  ED Course/ Medical Decision Making/ A&P  Patient is a 77 year old female presenting with right-sided chest pain as described in the HPI.  Patient arrives with stable vital signs, is afebrile, with no hypoxia.  Physical examination reveals tenderness to the right lateral upper chest.  Laboratory studies including CBC, metabolic panel, and troponin x 2 are all normal.  Chest x-ray shows no acute process.  I highly suspect a musculoskeletal etiology.  There is nothing in the workup that indicates a cardiac etiology and feel as though patient can safely be discharged.  I have also considered pulmonary embolism, but highly doubt this.  Patient to be  discharged with continued use of her oxycodone.  Final Clinical Impression(s) / ED Diagnoses Final diagnoses:  None    Rx / DC Orders ED Discharge Orders     None         Geoffery Lyons, MD 06/26/23 [phone]

## 2023-06-26 NOTE — Discharge Instructions (Signed)
Continue taking oxycodone as previously prescribed as needed for pain.  Follow-up with your primary doctor if not improving in the next week.

## 2023-07-03 ENCOUNTER — Emergency Department (HOSPITAL_COMMUNITY): Payer: 59

## 2023-07-03 ENCOUNTER — Encounter (HOSPITAL_COMMUNITY): Payer: Self-pay

## 2023-07-03 ENCOUNTER — Other Ambulatory Visit: Payer: Self-pay

## 2023-07-03 ENCOUNTER — Emergency Department (HOSPITAL_COMMUNITY)
Admission: EM | Admit: 2023-07-03 | Discharge: 2023-07-03 | Disposition: A | Discharge status: Home / Self Care | Payer: 59 | Attending: Emergency Medicine | Admitting: Emergency Medicine

## 2023-07-03 DIAGNOSIS — R0781 Pleurodynia: Secondary | ICD-10-CM | POA: Insufficient documentation

## 2023-07-03 DIAGNOSIS — R21 Rash and other nonspecific skin eruption: Secondary | ICD-10-CM | POA: Diagnosis present

## 2023-07-03 DIAGNOSIS — B000 Eczema herpeticum: Secondary | ICD-10-CM | POA: Diagnosis not present

## 2023-07-03 DIAGNOSIS — L309 Dermatitis, unspecified: Secondary | ICD-10-CM

## 2023-07-03 MED ORDER — TRIAMCINOLONE ACETONIDE 0.1 % EX CREA: 1.0000 | TOPICAL_CREAM | Freq: Two times a day (BID) | CUTANEOUS | 0 refills | Status: AC

## 2023-07-03 MED ORDER — DOXYCYCLINE HYCLATE 100 MG PO CAPS: 100.0000 mg | ORAL_CAPSULE | Freq: Two times a day (BID) | ORAL | 0 refills | Status: DC

## 2023-07-03 NOTE — ED Provider Notes (Signed)
[place] EMERGENCY DEPARTMENT AT [facility] Provider Note   CSN: [id number] Arrival date & time: 07/03/23  1522     History  Chief Complaint  Patient presents with   Rash    Left foot    [name] is a 77 y.o. female.   Rash  77 year old female known to the emergency department for a visit about a week ago for some chest pain, severe emphysema history, also has a history of eczema and bilateral ankles right more than left.  Reports having increasing itching, she has had this for years, has been prescribed different creams, nothing really helps and it seems to be getting worse recently.  It is only on her ankles.  He also endorses a mild cough associated with the right-sided chest pain.    Home Medications Prior to Admission medications   Medication Sig Start Date End Date Taking? Authorizing Provider  triamcinolone cream (KENALOG) 0.1 % Apply 1 Application topically 2 (two) times daily. 07/03/23  Yes Eber Hong, MD  acetaminophen (TYLENOL) 325 MG tablet Take 325 mg by mouth every 6 (six) hours as needed for mild pain. 08/31/17   [provider]  albuterol (PROVENTIL HFA;VENTOLIN HFA) 108 (90 Base) MCG/ACT inhaler Inhale 2 puffs into the lungs every 4 (four) hours as needed for wheezing or shortness of breath. 02/12/18   Samuel Jester, DO  Calcium Carbonate (CALCIUM 600 PO)     [provider]  cilostazol (PLETAL) 100 MG tablet Take 100 mg by mouth 2 (two) times daily.  03/29/15   [provider]  cyclobenzaprine (FLEXERIL) 5 MG tablet Take 5 mg by mouth at bedtime. 05/31/18   [provider]  cycloSPORINE (RESTASIS) 0.05 % ophthalmic emulsion Place 1 drop into both eyes 2 (two) times daily. 03/27/16   [provider]  Docusate Sodium (DSS) 100 MG CAPS Take 100 mg by mouth daily as needed (constipation). 01/03/21   [provider]  DULoxetine (CYMBALTA) 60 MG capsule Take 60 mg by mouth 2 (two) times daily.      [provider]  escitalopram (LEXAPRO) 10 MG tablet Take 10 mg by mouth daily. 01/30/17   [provider]  ezetimibe (ZETIA) 10 MG tablet Take 10 mg by mouth daily. 06/11/23   [provider]  feeding supplement (ENSURE ENLIVE / ENSURE PLUS) LIQD Take 237 mLs by mouth 2 (two) times daily between meals. 06/26/22   Sherryll Burger, Pratik D, DO  fluticasone (FLONASE) 50 MCG/ACT nasal spray Place 2 sprays into both nostrils daily. 01/13/23   [provider]  Fluticasone-Salmeterol (ADVAIR) 250-50 MCG/DOSE AEPB Inhale 1 puff into the lungs as needed.     [provider]  hydrOXYzine (ATARAX/VISTARIL) 25 MG tablet Take 1 tablet by mouth as needed for anxiety or itching.  04/21/16   [provider]  linaclotide (LINZESS) 145 MCG CAPS capsule Take 145 mcg by mouth daily before breakfast. 04/16/17   [provider]  LORazepam (ATIVAN) 1 MG tablet Take 1 mg by mouth every 8 (eight) hours as needed for anxiety. Reported on 11/19/2015    [provider]  lovastatin (MEVACOR) 10 MG tablet Take 10 mg by mouth daily. 06/23/23   [provider]  lubiprostone (AMITIZA) 24 MCG capsule TAKE 1 CAPSULE BY MOUTH TWICE DAILY WITH A MEAL. 02/25/23   Gelene Mink, NP  mupirocin cream (BACTROBAN) 2 % Apply 1 Application topically 2 (two) times daily. 06/15/22   Eber Hong, MD  oxyCODONE-acetaminophen [facility])  10-325 MG tablet Take 1 tablet by mouth as needed. 12/27/20   [provider]  Pancrelipase, Lip-Prot-Amyl, (CREON) 24000-76000 units CPEP TAKE 2 CAPSULES WITH EACH MEALS AND 1 CAPSULE WITH SNACKS. MAX 2 SNACKS PER DAY. 11/10/22   Gelene Mink, NP  pantoprazole (PROTONIX) 40 MG tablet Take 1 tablet (40 mg total) by mouth 2 (two) times daily before a meal. 07/16/21   Gelene Mink, NP  potassium chloride (KLOR-CON) 10 MEQ tablet Take 10 mEq by mouth daily. 06/23/23   [provider]  potassium chloride SA (K-DUR,KLOR-CON) 20 MEQ tablet Take  20 mEq by mouth daily.    [provider]  PROLIA 60 MG/ML SOSY injection Inject 60 mg into the skin every 6 (six) months.  05/31/18   [provider]  promethazine (PHENERGAN) 25 MG tablet Take 25 mg by mouth as needed for nausea or vomiting.     [provider]  Vitamin D, Ergocalciferol, (DRISDOL) 1.25 MG (50000 UNIT) CAPS capsule Take 50,000 Units by mouth every 7 (seven) days. 08/31/17   [provider]      Allergies    Ibuprofen, Iohexol, Other, Strawberry (diagnostic), and Strawberry extract    Review of Systems   Review of Systems  Skin:  Positive for rash.  All other systems reviewed and are negative.   Physical Exam Updated Vital Signs BP 114/76 (BP Location: Left Arm)   Pulse 69   Temp 98.3 F (36.8 C) (Oral)   Resp 16   Ht 1.626 m (5\' 4" )   Wt 36.3 kg   SpO2 98%   BMI 13.74 kg/m  Physical Exam Vitals and nursing note reviewed.  Constitutional:      General: She is not in acute distress.    Appearance: She is well-developed.  HENT:     Head: Normocephalic and atraumatic.     Mouth/Throat:     Pharynx: No oropharyngeal exudate.  Eyes:     General: No scleral icterus.       Right eye: No discharge.        Left eye: No discharge.     Conjunctiva/sclera: Conjunctivae normal.     Pupils: Pupils are equal, round, and reactive to light.  Neck:     Thyroid: No thyromegaly.     Vascular: No JVD.  Cardiovascular:     Rate and Rhythm: Normal rate and regular rhythm.     Heart sounds: Normal heart sounds. No murmur heard.    No friction rub. No gallop.  Pulmonary:     Effort: Pulmonary effort is normal. No respiratory distress.     Breath sounds: No wheezing or rales.     Comments: Speaks in full sentences, no oxygen needed, decreased breath sounds bilaterally Abdominal:     General: Bowel sounds are normal. There is no distension.     Palpations: Abdomen is soft. There is no mass.     Tenderness: There is no abdominal  tenderness.  Musculoskeletal:        General: No tenderness. Normal range of motion.     Cervical back: Normal range of motion and neck supple.  Lymphadenopathy:     Cervical: No cervical adenopathy.  Skin:    General: Skin is warm and dry.     Findings: No erythema or rash.     Comments: Eczematous rash to the bilateral ankles right worse than left, there is no rash over the right chest wall  Neurological:     Mental Status:  She is alert.     Coordination: Coordination normal.  Psychiatric:        Behavior: Behavior normal.     ED Results / Procedures / Treatments   Labs (all labs ordered are listed, but only abnormal results are displayed) Labs Reviewed - No data to display  EKG None  Radiology No results found.  Procedures Procedures    Medications Ordered in ED Medications - No data to display  ED Course/ Medical Decision Making/ A&P                                 Medical Decision Making Amount and/or Complexity of Data Reviewed Radiology: ordered.  Risk Prescription drug management.    This patient presents to the ED for concern of chest pain and bilateral ankle rash differential diagnosis includes definitely eczema on the ankles, this is a dermatitis and can tolerate a topical steroid.  I have reviewed her x-ray from a week ago, it will need to be repeated to make sure she has not developed complication such as a pneumothorax given her severe emphysema otherwise she appears clinically stable    Additional history obtained:  Additional history obtained from medical record External records from outside source obtained and reviewed including prior imaging including x-ray from a week ago   Lab Tests:  I Ordered, and personally interpreted labs.  The pertinent results include: None indicated   Imaging Studies ordered:  I ordered imaging studies including chest x-ray I independently visualized and interpreted imaging which showed Zima, no  pneumothorax I agree with the radiologist interpretation   Medicines ordered and prescription drug management:  I ordered medication including triamcinolone for dermatitis I have reviewed the patients home medicines and have made adjustments as needed   Problem List / ED Course:  Patient stable for discharge, no abnormal vital signs to suggest that she has a pulmonary embolism pneumonia heart failure or any other worsening pulmonary symptoms, her pain is only when taking a deep breath focally on the right side, has been there for more than a week   Social Determinants of Health:  Chronic tobacco abuse  I have discussed with the patient at the bedside the results, and the meaning of these results.  They have had opportunity to ask questions,  expressed their understanding to the need for follow-up with primary care physician           Final Clinical Impression(s) / ED Diagnoses Final diagnoses:  Pleuritic chest pain  Eczema, unspecified type    Rx / DC Orders ED Discharge Orders          Ordered    triamcinolone cream (KENALOG) 0.1 %  2 times daily        07/03/23 1729              Eber Hong, MD 07/03/23 1731

## 2023-07-03 NOTE — ED Triage Notes (Signed)
Pt presents with eczema to left foot, radiating up leg.

## 2023-07-03 NOTE — Discharge Instructions (Addendum)
Apply triamcinolone 2 or 3 times per day to the affected rash on your feet.  Your x-ray shows no signs of pneumonia  The x-ray shows a possible early pneumonia,  Doxycycline is an antibiotic which is taken twice a day, this treats bacterial infections that can cause staph infections, it treats sinus infections and some pneumonia. It also treats tick infections like Lyme's disease.  In this case I would like for you to take the antibiotic exactly as prescribed until it is completed.  Please be aware that occasionally people will get a rash if they are in the sunlight for extended periods of time while taking this medicine.  Thank you for allowing [place] to treat you in the emergency department today.  After reviewing your examination and potential testing that was done it appears that you are safe to go home.  I would like for you to follow-up with your doctor within the next several days, have them obtain your records and follow-up with them to review all potential tests and results from your visit.  If you should develop severe or worsening symptoms return to the emergency department immediately

## 2023-07-11 ENCOUNTER — Emergency Department (HOSPITAL_COMMUNITY): Payer: 59

## 2023-07-11 ENCOUNTER — Other Ambulatory Visit: Payer: Self-pay

## 2023-07-11 ENCOUNTER — Inpatient Hospital Stay (HOSPITAL_COMMUNITY)
Admission: EM | Admit: 2023-07-11 | Discharge: 2023-07-14 | DRG: 392 | Disposition: A | Discharge status: Home / Self Care | Payer: 59 | Attending: Internal Medicine | Admitting: Internal Medicine

## 2023-07-11 DIAGNOSIS — R627 Adult failure to thrive: Secondary | ICD-10-CM | POA: Diagnosis present

## 2023-07-11 DIAGNOSIS — Z90711 Acquired absence of uterus with remaining cervical stump: Secondary | ICD-10-CM

## 2023-07-11 DIAGNOSIS — F341 Dysthymic disorder: Secondary | ICD-10-CM | POA: Diagnosis present

## 2023-07-11 DIAGNOSIS — F32A Depression, unspecified: Secondary | ICD-10-CM | POA: Diagnosis present

## 2023-07-11 DIAGNOSIS — R54 Age-related physical debility: Secondary | ICD-10-CM | POA: Diagnosis present

## 2023-07-11 DIAGNOSIS — R197 Diarrhea, unspecified: Secondary | ICD-10-CM | POA: Diagnosis not present

## 2023-07-11 DIAGNOSIS — K625 Hemorrhage of anus and rectum: Secondary | ICD-10-CM | POA: Diagnosis not present

## 2023-07-11 DIAGNOSIS — F17210 Nicotine dependence, cigarettes, uncomplicated: Secondary | ICD-10-CM | POA: Diagnosis present

## 2023-07-11 DIAGNOSIS — Z87442 Personal history of urinary calculi: Secondary | ICD-10-CM

## 2023-07-11 DIAGNOSIS — K5909 Other constipation: Secondary | ICD-10-CM | POA: Diagnosis present

## 2023-07-11 DIAGNOSIS — K529 Noninfective gastroenteritis and colitis, unspecified: Principal | ICD-10-CM | POA: Diagnosis present

## 2023-07-11 DIAGNOSIS — R112 Nausea with vomiting, unspecified: Secondary | ICD-10-CM | POA: Diagnosis not present

## 2023-07-11 DIAGNOSIS — Z8701 Personal history of pneumonia (recurrent): Secondary | ICD-10-CM

## 2023-07-11 DIAGNOSIS — K922 Gastrointestinal hemorrhage, unspecified: Secondary | ICD-10-CM | POA: Diagnosis present

## 2023-07-11 DIAGNOSIS — J439 Emphysema, unspecified: Secondary | ICD-10-CM | POA: Diagnosis present

## 2023-07-11 DIAGNOSIS — K219 Gastro-esophageal reflux disease without esophagitis: Secondary | ICD-10-CM | POA: Diagnosis present

## 2023-07-11 DIAGNOSIS — T402X5A Adverse effect of other opioids, initial encounter: Secondary | ICD-10-CM | POA: Diagnosis present

## 2023-07-11 DIAGNOSIS — R109 Unspecified abdominal pain: Secondary | ICD-10-CM | POA: Diagnosis present

## 2023-07-11 DIAGNOSIS — K861 Other chronic pancreatitis: Secondary | ICD-10-CM | POA: Diagnosis present

## 2023-07-11 DIAGNOSIS — S32029A Unspecified fracture of second lumbar vertebra, initial encounter for closed fracture: Secondary | ICD-10-CM | POA: Diagnosis present

## 2023-07-11 DIAGNOSIS — Z7902 Long term (current) use of antithrombotics/antiplatelets: Secondary | ICD-10-CM

## 2023-07-11 DIAGNOSIS — F419 Anxiety disorder, unspecified: Secondary | ICD-10-CM | POA: Diagnosis present

## 2023-07-11 DIAGNOSIS — D72829 Elevated white blood cell count, unspecified: Secondary | ICD-10-CM | POA: Diagnosis present

## 2023-07-11 DIAGNOSIS — E876 Hypokalemia: Secondary | ICD-10-CM | POA: Diagnosis present

## 2023-07-11 DIAGNOSIS — Z8249 Family history of ischemic heart disease and other diseases of the circulatory system: Secondary | ICD-10-CM

## 2023-07-11 DIAGNOSIS — K921 Melena: Secondary | ICD-10-CM

## 2023-07-11 DIAGNOSIS — J449 Chronic obstructive pulmonary disease, unspecified: Secondary | ICD-10-CM | POA: Diagnosis present

## 2023-07-11 DIAGNOSIS — X58XXXA Exposure to other specified factors, initial encounter: Secondary | ICD-10-CM | POA: Diagnosis present

## 2023-07-11 DIAGNOSIS — R6510 Systemic inflammatory response syndrome (SIRS) of non-infectious origin without acute organ dysfunction: Secondary | ICD-10-CM | POA: Diagnosis present

## 2023-07-11 DIAGNOSIS — I700 Atherosclerosis of aorta: Secondary | ICD-10-CM | POA: Diagnosis present

## 2023-07-11 DIAGNOSIS — G8929 Other chronic pain: Secondary | ICD-10-CM | POA: Diagnosis present

## 2023-07-11 DIAGNOSIS — E785 Hyperlipidemia, unspecified: Secondary | ICD-10-CM | POA: Diagnosis present

## 2023-07-11 DIAGNOSIS — Z860100 Personal history of colon polyps, unspecified: Secondary | ICD-10-CM

## 2023-07-11 DIAGNOSIS — I2510 Atherosclerotic heart disease of native coronary artery without angina pectoris: Secondary | ICD-10-CM | POA: Diagnosis present

## 2023-07-11 DIAGNOSIS — Z8619 Personal history of other infectious and parasitic diseases: Secondary | ICD-10-CM

## 2023-07-11 DIAGNOSIS — Z79899 Other long term (current) drug therapy: Secondary | ICD-10-CM

## 2023-07-11 DIAGNOSIS — Z8541 Personal history of malignant neoplasm of cervix uteri: Secondary | ICD-10-CM

## 2023-07-11 DIAGNOSIS — E860 Dehydration: Secondary | ICD-10-CM | POA: Diagnosis present

## 2023-07-11 DIAGNOSIS — Z800 Family history of malignant neoplasm of digestive organs: Secondary | ICD-10-CM

## 2023-07-11 DIAGNOSIS — E872 Acidosis, unspecified: Secondary | ICD-10-CM | POA: Diagnosis present

## 2023-07-11 DIAGNOSIS — Z8673 Personal history of transient ischemic attack (TIA), and cerebral infarction without residual deficits: Secondary | ICD-10-CM

## 2023-07-11 LAB — CBC WITH DIFFERENTIAL/PLATELET
Abs Immature Granulocytes: 0.35 10*3/uL — ABNORMAL HIGH (ref 0.00–0.07)
Basophils Absolute: 0 10*3/uL (ref 0.0–0.1)
Basophils Relative: 0 %
Eosinophils Absolute: 0 10*3/uL (ref 0.0–0.5)
Eosinophils Relative: 0 %
HCT: 40.2 % (ref 36.0–46.0)
Hemoglobin: 13.4 g/dL (ref 12.0–15.0)
Immature Granulocytes: 1 %
Lymphocytes Relative: 4 %
Lymphs Abs: 1 10*3/uL (ref 0.7–4.0)
MCH: 30.7 pg (ref 26.0–34.0)
MCHC: 33.3 g/dL (ref 30.0–36.0)
MCV: 92 fL (ref 80.0–100.0)
Monocytes Absolute: 4.1 10*3/uL — ABNORMAL HIGH (ref 0.1–1.0)
Monocytes Relative: 15 %
Neutro Abs: 22.1 10*3/uL — ABNORMAL HIGH (ref 1.7–7.7)
Neutrophils Relative %: 80 %
Platelets: 316 10*3/uL (ref 150–400)
RBC: 4.37 MIL/uL (ref 3.87–5.11)
RDW: 14 % (ref 11.5–15.5)
WBC: 27.6 10*3/uL — ABNORMAL HIGH (ref 4.0–10.5)
nRBC: 0 % (ref 0.0–0.2)

## 2023-07-11 LAB — CBC
HCT: 37.7 % (ref 36.0–46.0)
HCT: 37.7 % (ref 36.0–46.0)
HCT: 36.4 % (ref 36.0–46.0)
Hemoglobin: 12.8 g/dL (ref 12.0–15.0)
Hemoglobin: 12.9 g/dL (ref 12.0–15.0)
Hemoglobin: 12.6 g/dL (ref 12.0–15.0)
MCH: 31.1 pg (ref 26.0–34.0)
MCH: 31.4 pg (ref 26.0–34.0)
MCH: 31.3 pg (ref 26.0–34.0)
MCHC: 34 g/dL (ref 30.0–36.0)
MCHC: 34.2 g/dL (ref 30.0–36.0)
MCHC: 34.6 g/dL (ref 30.0–36.0)
MCV: 91.5 fL (ref 80.0–100.0)
MCV: 91.7 fL (ref 80.0–100.0)
MCV: 90.5 fL (ref 80.0–100.0)
Platelets: 275 10*3/uL (ref 150–400)
Platelets: 279 10*3/uL (ref 150–400)
Platelets: 281 10*3/uL (ref 150–400)
RBC: 4.12 MIL/uL (ref 3.87–5.11)
RBC: 4.11 MIL/uL (ref 3.87–5.11)
RBC: 4.02 MIL/uL (ref 3.87–5.11)
RDW: 13.9 % (ref 11.5–15.5)
RDW: 14.2 % (ref 11.5–15.5)
RDW: 14.3 % (ref 11.5–15.5)
WBC: 24.2 10*3/uL — ABNORMAL HIGH (ref 4.0–10.5)
WBC: 19 10*3/uL — ABNORMAL HIGH (ref 4.0–10.5)
WBC: 17.6 10*3/uL — ABNORMAL HIGH (ref 4.0–10.5)
nRBC: 0 % (ref 0.0–0.2)
nRBC: 0 % (ref 0.0–0.2)
nRBC: 0 % (ref 0.0–0.2)

## 2023-07-11 LAB — PROCALCITONIN: Procalcitonin: 0.77 ng/mL

## 2023-07-11 LAB — LACTIC ACID, PLASMA
Lactic Acid, Venous: 3.9 mmol/L (ref 0.5–1.9)
Lactic Acid, Venous: 2.7 mmol/L (ref 0.5–1.9)
Lactic Acid, Venous: 1.9 mmol/L (ref 0.5–1.9)
Lactic Acid, Venous: 1.7 mmol/L (ref 0.5–1.9)

## 2023-07-11 LAB — COMPREHENSIVE METABOLIC PANEL
ALT: 28 U/L (ref 0–44)
AST: 37 U/L (ref 15–41)
Albumin: 4.2 g/dL (ref 3.5–5.0)
Alkaline Phosphatase: 81 U/L (ref 38–126)
Anion gap: 14 (ref 5–15)
BUN: 23 mg/dL (ref 8–23)
CO2: 23 mmol/L (ref 22–32)
Calcium: 9.2 mg/dL (ref 8.9–10.3)
Chloride: 100 mmol/L (ref 98–111)
Creatinine, Ser: 0.63 mg/dL (ref 0.44–1.00)
GFR, Estimated: >60 mL/min (ref >60)
Glucose, Bld: 189 mg/dL — ABNORMAL HIGH (ref 70–99)
Potassium: 3.3 mmol/L — ABNORMAL LOW (ref 3.5–5.1)
Sodium: 137 mmol/L (ref 135–145)
Total Bilirubin: 0.7 mg/dL (ref 0.3–1.2)
Total Protein: 6.9 g/dL (ref 6.5–8.1)

## 2023-07-11 LAB — URINALYSIS, ROUTINE W REFLEX MICROSCOPIC
Bacteria, UA: NONE SEEN
Bilirubin Urine: NEGATIVE
Glucose, UA: NEGATIVE mg/dL
Ketones, ur: NEGATIVE mg/dL
Nitrite: NEGATIVE
Protein, ur: 30 mg/dL — AB
RBC / HPF: >50 RBC/hpf (ref 0–5)
Specific Gravity, Urine: 1.029 (ref 1.005–1.030)
pH: 6 (ref 5.0–8.0)

## 2023-07-11 LAB — LIPASE, BLOOD: Lipase: 29 U/L (ref 11–51)

## 2023-07-11 LAB — MAGNESIUM
Magnesium: 1.8 mg/dL (ref 1.7–2.4)
Magnesium: 1.7 mg/dL (ref 1.7–2.4)

## 2023-07-11 LAB — PROTIME-INR
INR: 1 (ref 0.8–1.2)
Prothrombin Time: 13.4 s (ref 11.4–15.2)

## 2023-07-11 LAB — TYPE AND SCREEN
ABO/RH(D): A POS
Antibody Screen: NEGATIVE

## 2023-07-11 LAB — PHOSPHORUS: Phosphorus: 2.9 mg/dL (ref 2.5–4.6)

## 2023-07-11 LAB — POC OCCULT BLOOD, ED: Fecal Occult Bld: POSITIVE — AB

## 2023-07-11 MED ORDER — OXYCODONE HCL 5 MG PO TABS: 5.0000 mg | ORAL_TABLET | ORAL | Status: DC | PRN

## 2023-07-11 MED ORDER — PROMETHAZINE HCL 12.5 MG PO TABS: 25.0000 mg | ORAL_TABLET | Freq: Every day | ORAL | Status: DC | PRN

## 2023-07-11 MED ORDER — TRAZODONE HCL 50 MG PO TABS
50.0000 mg | ORAL_TABLET | Freq: Every evening | ORAL | Status: DC | PRN
  Administered 2023-07-13: 50 mg via ORAL
  Filled 2023-07-11: qty 1

## 2023-07-11 MED ORDER — HYALURONIDASE HUMAN 150 UNIT/ML IJ SOLN
150.0000 [IU] | Freq: Once | INTRAMUSCULAR | Status: AC
  Administered 2023-07-11: 150 [IU] via SUBCUTANEOUS
  Filled 2023-07-11: qty 1

## 2023-07-11 MED ORDER — HYDROMORPHONE HCL 1 MG/ML IJ SOLN
0.5000 mg | Freq: Once | INTRAMUSCULAR | Status: AC
  Administered 2023-07-11: 0.5 mg via INTRAVENOUS
  Filled 2023-07-11: qty 0.5

## 2023-07-11 MED ORDER — SODIUM CHLORIDE 0.9 % IV SOLN
3.0000 g | Freq: Two times a day (BID) | INTRAVENOUS | Status: DC
  Administered 2023-07-11 – 2023-07-13 (×4): 3 g via INTRAVENOUS
  Filled 2023-07-11 (×4): qty 8

## 2023-07-11 MED ORDER — PRAVASTATIN SODIUM 10 MG PO TABS
20.0000 mg | ORAL_TABLET | Freq: Every day | ORAL | Status: DC
  Administered 2023-07-12 – 2023-07-13 (×2): 20 mg via ORAL
  Filled 2023-07-11 (×2): qty 2

## 2023-07-11 MED ORDER — METOCLOPRAMIDE HCL 5 MG/ML IJ SOLN
10.0000 mg | Freq: Four times a day (QID) | INTRAMUSCULAR | Status: DC | PRN
  Administered 2023-07-11: 10 mg via INTRAVENOUS
  Filled 2023-07-11: qty 2

## 2023-07-11 MED ORDER — SODIUM CHLORIDE 0.9% FLUSH: 3.0000 mL | Freq: Two times a day (BID) | INTRAVENOUS | Status: DC

## 2023-07-11 MED ORDER — HYDROMORPHONE HCL 1 MG/ML IJ SOLN
0.5000 mg | INTRAMUSCULAR | Status: DC | PRN
  Administered 2023-07-12 – 2023-07-13 (×8): 1 mg via INTRAVENOUS
  Administered 2023-07-13: 0.5 mg via INTRAVENOUS
  Administered 2023-07-14: 1 mg via INTRAVENOUS
  Filled 2023-07-11 (×10): qty 1

## 2023-07-11 MED ORDER — HEPARIN SODIUM (PORCINE) 5000 UNIT/ML IJ SOLN: 5000.0000 [IU] | Freq: Three times a day (TID) | INTRAMUSCULAR | Status: DC

## 2023-07-11 MED ORDER — EZETIMIBE 10 MG PO TABS
10.0000 mg | ORAL_TABLET | Freq: Every day | ORAL | Status: DC
  Administered 2023-07-12 – 2023-07-14 (×3): 10 mg via ORAL
  Filled 2023-07-11 (×3): qty 1

## 2023-07-11 MED ORDER — OXYCODONE-ACETAMINOPHEN 5-325 MG PO TABS
1.0000 | ORAL_TABLET | Freq: Four times a day (QID) | ORAL | Status: DC | PRN
  Administered 2023-07-11 – 2023-07-14 (×4): 1 via ORAL
  Filled 2023-07-11 (×4): qty 1

## 2023-07-11 MED ORDER — ACETAMINOPHEN 325 MG PO TABS
650.0000 mg | ORAL_TABLET | Freq: Four times a day (QID) | ORAL | Status: DC | PRN
  Filled 2023-07-11: qty 2

## 2023-07-11 MED ORDER — DULOXETINE HCL 30 MG PO CPEP
60.0000 mg | ORAL_CAPSULE | Freq: Two times a day (BID) | ORAL | Status: DC
  Administered 2023-07-11 – 2023-07-14 (×7): 60 mg via ORAL
  Filled 2023-07-11 (×7): qty 2

## 2023-07-11 MED ORDER — OXYCODONE-ACETAMINOPHEN 10-325 MG PO TABS: 1.0000 | ORAL_TABLET | Freq: Four times a day (QID) | ORAL | Status: DC | PRN

## 2023-07-11 MED ORDER — POTASSIUM CHLORIDE CRYS ER 20 MEQ PO TBCR
20.0000 meq | EXTENDED_RELEASE_TABLET | Freq: Two times a day (BID) | ORAL | Status: AC
  Administered 2023-07-11 (×2): 20 meq via ORAL
  Filled 2023-07-11 (×2): qty 1

## 2023-07-11 MED ORDER — POTASSIUM CHLORIDE 10 MEQ/100ML IV SOLN
10.0000 meq | INTRAVENOUS | Status: DC
  Administered 2023-07-11: 10 meq via INTRAVENOUS
  Filled 2023-07-11 (×2): qty 100

## 2023-07-11 MED ORDER — SODIUM CHLORIDE 0.9 % IV SOLN
1.5000 g | Freq: Once | INTRAVENOUS | Status: AC
  Administered 2023-07-11: 1.5 g via INTRAVENOUS
  Filled 2023-07-11: qty 4

## 2023-07-11 MED ORDER — SODIUM CHLORIDE 0.9 % IV SOLN: INTRAVENOUS | Status: AC

## 2023-07-11 MED ORDER — MEGESTROL ACETATE 400 MG/10ML PO SUSP
400.0000 mg | Freq: Every day | ORAL | Status: DC
  Administered 2023-07-12 – 2023-07-14 (×3): 400 mg via ORAL
  Filled 2023-07-11 (×3): qty 10

## 2023-07-11 MED ORDER — SENNOSIDES-DOCUSATE SODIUM 8.6-50 MG PO TABS: 1.0000 | ORAL_TABLET | Freq: Every evening | ORAL | Status: DC | PRN

## 2023-07-11 MED ORDER — SODIUM CHLORIDE 0.9 % IV SOLN: INTRAVENOUS | Status: DC

## 2023-07-11 MED ORDER — SODIUM CHLORIDE 0.9% FLUSH: 3.0000 mL | INTRAVENOUS | Status: DC | PRN

## 2023-07-11 MED ORDER — IPRATROPIUM BROMIDE 0.02 % IN SOLN: 0.5000 mg | Freq: Four times a day (QID) | RESPIRATORY_TRACT | Status: DC | PRN

## 2023-07-11 MED ORDER — HYDRALAZINE HCL 20 MG/ML IJ SOLN: 10.0000 mg | INTRAMUSCULAR | Status: DC | PRN

## 2023-07-11 MED ORDER — SODIUM CHLORIDE 0.9 % IV BOLUS
1000.0000 mL | Freq: Once | INTRAVENOUS | Status: AC
  Administered 2023-07-11: 1000 mL via INTRAVENOUS

## 2023-07-11 MED ORDER — BISACODYL 5 MG PO TBEC: 5.0000 mg | DELAYED_RELEASE_TABLET | Freq: Every day | ORAL | Status: DC | PRN

## 2023-07-11 MED ORDER — LORAZEPAM 1 MG PO TABS: 1.0000 mg | ORAL_TABLET | Freq: Three times a day (TID) | ORAL | Status: DC | PRN

## 2023-07-11 MED ORDER — SODIUM CHLORIDE 0.9% FLUSH
3.0000 mL | Freq: Two times a day (BID) | INTRAVENOUS | Status: DC
  Administered 2023-07-11 – 2023-07-14 (×7): 3 mL via INTRAVENOUS

## 2023-07-11 MED ORDER — OXYCODONE HCL 5 MG PO TABS
5.0000 mg | ORAL_TABLET | Freq: Four times a day (QID) | ORAL | Status: DC | PRN
  Administered 2023-07-11 – 2023-07-14 (×4): 5 mg via ORAL
  Filled 2023-07-11 (×4): qty 1

## 2023-07-11 MED ORDER — RISAQUAD PO CAPS
2.0000 | ORAL_CAPSULE | Freq: Three times a day (TID) | ORAL | Status: DC
  Administered 2023-07-11 – 2023-07-14 (×10): 2 via ORAL
  Filled 2023-07-11 (×10): qty 2

## 2023-07-11 MED ORDER — ACETAMINOPHEN 650 MG RE SUPP: 650.0000 mg | Freq: Four times a day (QID) | RECTAL | Status: DC | PRN

## 2023-07-11 MED ORDER — TRAZODONE HCL 50 MG PO TABS: 25.0000 mg | ORAL_TABLET | Freq: Every evening | ORAL | Status: DC | PRN

## 2023-07-11 MED ORDER — PANCRELIPASE (LIP-PROT-AMYL) 12000-38000 UNITS PO CPEP
12000.0000 [IU] | ORAL_CAPSULE | Freq: Three times a day (TID) | ORAL | Status: DC
  Administered 2023-07-12 – 2023-07-14 (×8): 12000 [IU] via ORAL
  Filled 2023-07-11 (×8): qty 1

## 2023-07-11 MED ORDER — FLEET ENEMA RE ENEM: 1.0000 | ENEMA | Freq: Once | RECTAL | Status: DC | PRN

## 2023-07-11 MED ORDER — SODIUM CHLORIDE 0.9 % IV SOLN
1.5000 g | Freq: Two times a day (BID) | INTRAVENOUS | Status: DC
  Filled 2023-07-11 (×2): qty 4

## 2023-07-11 MED ORDER — PANTOPRAZOLE SODIUM 40 MG IV SOLR
40.0000 mg | Freq: Two times a day (BID) | INTRAVENOUS | Status: DC
  Administered 2023-07-11 – 2023-07-12 (×3): 40 mg via INTRAVENOUS
  Filled 2023-07-11 (×3): qty 10

## 2023-07-11 MED ORDER — METOCLOPRAMIDE HCL 5 MG/ML IJ SOLN
10.0000 mg | Freq: Once | INTRAMUSCULAR | Status: AC
  Administered 2023-07-11: 10 mg via INTRAVENOUS
  Filled 2023-07-11: qty 2

## 2023-07-11 MED ORDER — SODIUM CHLORIDE 0.9 % IV SOLN: 250.0000 mL | INTRAVENOUS | Status: DC | PRN

## 2023-07-11 MED ORDER — MAGNESIUM SULFATE 2 GM/50ML IV SOLN
2.0000 g | Freq: Once | INTRAVENOUS | Status: AC
  Administered 2023-07-11: 2 g via INTRAVENOUS
  Filled 2023-07-11: qty 50

## 2023-07-11 NOTE — Assessment & Plan Note (Signed)
-  Monitoring -Serum potassium 2.7 - Repleting p.o./IV

## 2023-07-11 NOTE — Assessment & Plan Note (Signed)
-   Possible infectious process enteritis-with GI bleed -NPO, as needed Reglan -Continue IV fluids -N.p.o. for now

## 2023-07-11 NOTE — Progress Notes (Signed)
PT Cancellation Note  Patient Details Name: BONNETTA ALLBEE MRN: [id number] DOB: 12-Aug-1946   Cancelled Treatment:     PT initially entering room to start evaluation. MD entered room and stated "you can assess her later today."  PT will attempt to evaluate at later time pending pt status and as schedule allows.    Nelida Meuse 07/11/2023, 8:52 AM

## 2023-07-11 NOTE — ED Triage Notes (Signed)
Patient from home for N/V for 2 days and diarrhea with blood in stool that started tonight. Patient reports being recently treated for pneumonia with abx and steroids but unable to keep them down. Patient has a history of pancreatitis. EMS placed a 20G IV in the LFA. Upon arrival to ER, patient is alert and oriented, reports 10/10 pain all over; bloody stool noted on exterior of patient's rectum during provider exam

## 2023-07-11 NOTE — Assessment & Plan Note (Signed)
-  Status post gentle IV fluid hydration, encouraging p.o. hydration

## 2023-07-11 NOTE — Progress Notes (Signed)
PT Cancellation Note  Patient Details Name: [name] MRN: [id number] DOB: [birth year]   Cancelled Treatment:     Will hold PT evaluation as discussed with MD. PT will evaluate pt on Monday. Pt with increased nausea.   Nelida Meuse PT, DPT Physical Therapist with [facility] 07/11/2023, 11:52 AM

## 2023-07-11 NOTE — Assessment & Plan Note (Signed)
-  As needed Ativan, Home medication reviewed, Cymbalta, Lexapro, Atarax and home medication of Ativan reviewed Will be resumed accordingly

## 2023-07-11 NOTE — Assessment & Plan Note (Signed)
-  Hemodynamically stable, - Patient met SIRS criteria-with leukocytosis and lactic acidosis -Likely diet due to gastroenteritis  CBC 19.0 >>> 16.2 >>>  Lactic acid 3.9 >>1.9  Lipase: 29   CT abdominal/pelvic without contrast -negative any other organ findings, pancreas within normal limits IMPRESSION: 1. New but likely subacute fracture of the anterior aspect of the superior endplate of L2 with 20% loss of anterior vertebral body height. 2. No other acute findings are noted in the abdomen or pelvis to account for the patient's symptoms on today's noncontrast CT examination. 3. Advanced emphysema in the visualized lung bases. 4. Aortic atherosclerosis.  EG-sinus rhythm within normal limits  -Blood cultures x 2-no growth to date -Will continue  IV Unasyn -Will continue with gentle IV fluid hydration

## 2023-07-11 NOTE — ED Notes (Addendum)
Pt called out for her L forearm swelling from her IV that was running 0.9% NaCl and 10 mEq/100 mL of potassium. Swelling noted, IV appears to be extravasated, IV taken out, arm elevated and heat applied to extremity--MD made aware. MD states to elevated and apply heat, also followed up with pharmacy and they stated they will look more into the protocol and call back

## 2023-07-11 NOTE — ED Notes (Signed)
Pt is NPO at this time

## 2023-07-11 NOTE — Assessment & Plan Note (Signed)
-   Continue statin -N.p.o., will be reinitiated in a.m.

## 2023-07-11 NOTE — Assessment & Plan Note (Signed)
-   Initiating Protonix IV 40 mg twice daily -Switching to p.o.

## 2023-07-11 NOTE — Consult Note (Signed)
Referring Provider: No ref. provider found Primary Care Physician:  Benita Stabile, MD Primary Gastroenterologist:  Dr. 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  Denies much in the way of abdominal cramps.  She presented to the ED for further evaluation. Initial evaluation revealed hemodynamically stability, H&H 13.4/40.2 (hemiglobin 2 weeks ago 13.5/39.9).  However, white count elevated 27.6.  Also, lactic acid elevated on admission but has subsequently normalized. CT scan of the abdomen demonstrated subacute L2 fracture.  No acute intra-abdominal findings.  Patient states she has not had any further diarrhea but passed a small amount of blood today.  She has been started on Augmentin, blood cultures taken.  A GIP was ordered.  No stools to submit thus far.  Recent steroids and antibiotics for pneumonia.  GI history significant for longstanding chronic pancreatitis with associated weight loss, failure to thrive.  Originally diagnosed by E[facility] Dr. Christella Hartigan 2010; saw Dr. Margaretha Glassing at [facility] around 2022. EUS with celiac block attempted at that time but aborted due lack of good fat planes precluding injection.    Until this acute illness, patient has actually done fairly well.  On PPI and pancreatic enzymes.  Amitiza for constipation -likely an opioid component.  She is approximately 85 pounds on this admission; was 79 pounds when last seen in our office 2 years ago.  Prior GI evaluation for abdominal pain included ultrasound/HIDA, CTA abdomen -all of which were negative.  Due for surveillance colonoscopy 2023 for history of colonic  adenoma-not done as of yet.   Past Medical History:  Diagnosis Date   Allergic rhinitis    Anxiety    Anxiety and depression    Back pain, chronic    CAD (coronary artery disease)    palpitations, dizziness, chest pain   Cervical cancer (HCC)    cervical   Chronic abdominal pain    with chronic nausea, diarrhea   Collagen vascular disease (HCC)    COPD (chronic obstructive pulmonary disease) (HCC)    Depression    GERD (gastroesophageal reflux disease)    Helicobacter pylori infection 2015   treated with prevpac   Hematochezia    History of kidney stones    Hx of colonic polyps    adenomatous   Hyperlipidemia    Lipid profile on 02/25/2011: 209, 113, 55, 132   Nephrolithiasis    Pancreatitis chronic    Renal calculus    Stroke (HCC) 3-4 yrs ago   left sided weakness   Tobacco abuse    Tubular adenoma 2015   Weight loss    CT negative for occult malignancy, negative celiac, negative adrenal insufficiency    Past Surgical History:  Procedure Laterality Date   ABDOMINAL HYSTERECTOMY     APPENDECTOMY     CATARACT EXTRACTION W/PHACO Right 05/04/2013   Procedure: CATARACT EXTRACTION PHACO AND INTRAOCULAR LENS PLACEMENT (IOC);  Surgeon: Gemma Payor, MD;  Location: [facility];  Service: Ophthalmology;  Laterality: Right;  CDE:16.17   CATARACT EXTRACTION W/PHACO Left 05/22/2013   Procedure: CATARACT EXTRACTION PHACO AND INTRAOCULAR LENS PLACEMENT (IOC);  Surgeon: Gemma Payor, MD;  Location: [facility];  Service: Ophthalmology;  Laterality: Left;  CDE:14.75   COLONOSCOPY N/A 02/08/2014  Dr.Jhovanny Guinta- normal rectum, colonic polyps bx=tubular adenoma   COLONOSCOPY W/ POLYPECTOMY  2006   [id number], benign gastric nodule, multiple adenomatous polyps, one with tubular morphology   COLONOSCOPY WITH PROPOFOL N/A 04/15/2017   normal   ESOPHAGOGASTRODUODENOSCOPY  2006   [id number] gastric nodule   ESOPHAGOGASTRODUODENOSCOPY N/A 02/08/2014   Dr.Ethne Jeon- abnormal stomach and gstric nodule bx=  hpylori   EUS  2010   Dr. Christella Hartigan : Multiple shadowing calcifications in pancreas, consistent with Chronic Pancreatitis.  These are mainly confined to a collection of calcifications in head/uncinate pancreas. Otherwise the pancreatic parenchyma appears fairly normal.  The main pancreatic duct, CBD are both normal without stones, dilation.     Ileocolonoscopy  01/11/2009   RMR: Polyp at the splenic flexure, status post hot snare removal/ Normal rectum, tubular adenoma   PARTIAL HYSTERECTOMY      Prior to Admission medications   Medication Sig Start Date End Date Taking? Authorizing Provider  acetaminophen (TYLENOL) 325 MG tablet Take 325 mg by mouth every 6 (six) hours as needed for mild pain. 08/31/17   [provider]  albuterol (PROVENTIL HFA;VENTOLIN HFA) 108 (90 Base) MCG/ACT inhaler Inhale 2 puffs into the lungs every 4 (four) hours as needed for wheezing or shortness of breath. 02/12/18   Samuel Jester, DO  Calcium Carbonate (CALCIUM 600 PO)     [provider]  cilostazol (PLETAL) 100 MG tablet Take 100 mg by mouth 2 (two) times daily.  03/29/15   [provider]  cyclobenzaprine (FLEXERIL) 5 MG tablet Take 5 mg by mouth at bedtime. 05/31/18   [provider]  cycloSPORINE (RESTASIS) 0.05 % ophthalmic emulsion Place 1 drop into both eyes 2 (two) times daily. 03/27/16   [provider]  Docusate Sodium (DSS) 100 MG CAPS Take 100 mg by mouth daily as needed (constipation). 01/03/21   [provider]  doxycycline (VIBRAMYCIN) 100 MG capsule Take 1 capsule (100 mg total) by mouth 2 (two) times daily. 07/03/23   Eber Hong, MD  DULoxetine (CYMBALTA) 60 MG capsule Take 60 mg by mouth 2 (two) times daily.     [provider]  escitalopram (LEXAPRO) 10 MG tablet Take 10 mg by mouth daily. 01/30/17   [provider]  ezetimibe (ZETIA) 10 MG tablet Take 10 mg by mouth daily. 06/11/23   [provider]  feeding supplement  (ENSURE ENLIVE / ENSURE PLUS) LIQD Take 237 mLs by mouth 2 (two) times daily between meals. 06/26/22   Sherryll Burger, Pratik D, DO  fluticasone (FLONASE) 50 MCG/ACT nasal spray Place 2 sprays into both nostrils daily. 01/13/23   [provider]  Fluticasone-Salmeterol (ADVAIR) 250-50 MCG/DOSE AEPB Inhale 1 puff into the lungs as needed.     [provider]  hydrOXYzine (ATARAX/VISTARIL) 25 MG tablet Take 1 tablet by mouth as needed for anxiety or itching.  04/21/16   [provider]  linaclotide (LINZESS) 145 MCG CAPS capsule Take 145 mcg by mouth daily before breakfast. 04/16/17   [provider]  LORazepam (ATIVAN) 1 MG tablet Take 1 mg by mouth every 8 (eight) hours as needed for anxiety. Reported on 11/19/2015    [provider]  lovastatin (MEVACOR) 10 MG tablet Take 10 mg by mouth daily. 06/23/23   [provider]  lubiprostone (AMITIZA) 24 MCG capsule TAKE 1 CAPSULE BY MOUTH TWICE DAILY WITH A MEAL. 02/25/23   Gelene Mink, NP  mupirocin cream (BACTROBAN) 2 % Apply 1 Application topically 2 (two) times daily.  06/15/22   Eber Hong, MD  oxyCODONE-acetaminophen (PERCOCET) 10-325 MG tablet Take 1 tablet by mouth as needed. 12/27/20   [provider]  Pancrelipase, Lip-Prot-Amyl, (CREON) 24000-76000 units CPEP TAKE 2 CAPSULES WITH EACH MEALS AND 1 CAPSULE WITH SNACKS. MAX 2 SNACKS PER DAY. 11/10/22   Gelene Mink, NP  pantoprazole (PROTONIX) 40 MG tablet Take 1 tablet (40 mg total) by mouth 2 (two) times daily before a meal. 07/16/21   Gelene Mink, NP  potassium chloride (KLOR-CON) 10 MEQ tablet Take 10 mEq by mouth daily. 06/23/23   [provider]  potassium chloride SA (K-DUR,KLOR-CON) 20 MEQ tablet Take 20 mEq by mouth daily.    [provider]  PROLIA 60 MG/ML SOSY injection Inject 60 mg into the skin every 6 (six) months.  05/31/18   [provider]  promethazine (PHENERGAN) 25 MG tablet Take 25 mg by mouth as needed  for nausea or vomiting.     [provider]  triamcinolone cream (KENALOG) 0.1 % Apply 1 Application topically 2 (two) times daily. 07/03/23   Eber Hong, MD  Vitamin D, Ergocalciferol, (DRISDOL) 1.25 MG (50000 UNIT) CAPS capsule Take 50,000 Units by mouth every 7 (seven) days. 08/31/17   [provider]    Current Facility-Administered Medications  Medication Dose Route Frequency Provider Last Rate Last Admin   0.9 %  sodium chloride infusion  250 mL Intravenous PRN Shahmehdi, Seyed A, MD       0.9 %  sodium chloride infusion   Intravenous Continuous Shahmehdi, Seyed A, MD       acetaminophen (TYLENOL) tablet 650 mg  650 mg Oral Q6H PRN Shahmehdi, Seyed A, MD       Or   acetaminophen (TYLENOL) suppository 650 mg  650 mg Rectal Q6H PRN Shahmehdi, Seyed A, MD       ampicillin-sulbactam (UNASYN) 1.5 g in sodium chloride 0.9 % 100 mL IVPB  1.5 g Intravenous Q12H Shahmehdi, Seyed A, MD       DULoxetine (CYMBALTA) DR capsule 60 mg  60 mg Oral BID Kendell Bane, MD       [START ON 07/12/2023] ezetimibe (ZETIA) tablet 10 mg  10 mg Oral Daily Shahmehdi, Seyed A, MD       hydrALAZINE (APRESOLINE) injection 10 mg  10 mg Intravenous Q4H PRN Shahmehdi, Seyed A, MD       HYDROmorphone (DILAUDID) injection 0.5-1 mg  0.5-1 mg Intravenous Q2H PRN Shahmehdi, Seyed A, MD       ipratropium (ATROVENT) nebulizer solution 0.5 mg  0.5 mg Nebulization Q6H PRN Shahmehdi, Gemma Payor, MD       [START ON 07/12/2023] lipase/protease/amylase (CREON) capsule 12,000 Units  12,000 Units Oral TID AC Shahmehdi, Seyed A, MD       LORazepam (ATIVAN) tablet 1 mg  1 mg Oral Q8H PRN Shahmehdi, Seyed A, MD       magnesium sulfate IVPB 2 g 50 mL  2 g Intravenous Once Shahmehdi, Seyed A, MD       metoCLOPramide (REGLAN) injection 10 mg  10 mg Intravenous Q6H PRN Shahmehdi, Seyed A, MD       oxyCODONE-acetaminophen (PERCOCET) 10-325 MG per tablet 1 tablet  1 tablet Oral Q6H PRN Shahmehdi, Seyed A, MD        pantoprazole (PROTONIX) injection 40 mg  40 mg Intravenous Q12H Shahmehdi, Seyed A, MD       potassium chloride 10 mEq in 100 mL IVPB  10 mEq Intravenous  Q1 Hr x 4 Zierle-Ghosh, Asia B, DO   Stopped at 07/11/23 0728   [START ON 07/12/2023] pravastatin (PRAVACHOL) tablet 20 mg  20 mg Oral q1800 Shahmehdi, Seyed A, MD       promethazine (PHENERGAN) tablet 25 mg  25 mg Oral PRN Shahmehdi, Seyed A, MD       sodium chloride flush (NS) 0.9 % injection 3 mL  3 mL Intravenous Q12H Shahmehdi, Seyed A, MD       sodium chloride flush (NS) 0.9 % injection 3 mL  3 mL Intravenous PRN Shahmehdi, Seyed A, MD       sodium phosphate (FLEET) enema 1 enema  1 enema Rectal Once PRN Shahmehdi, Seyed A, MD       traZODone (DESYREL) tablet 50 mg  50 mg Oral QHS PRN Shahmehdi, Gemma Payor, MD       Current Outpatient Medications  Medication Sig Dispense Refill   acetaminophen (TYLENOL) 325 MG tablet Take 325 mg by mouth every 6 (six) hours as needed for mild pain.     albuterol (PROVENTIL HFA;VENTOLIN HFA) 108 (90 Base) MCG/ACT inhaler Inhale 2 puffs into the lungs every 4 (four) hours as needed for wheezing or shortness of breath. 1 Inhaler 0   Calcium Carbonate (CALCIUM 600 PO)      cilostazol (PLETAL) 100 MG tablet Take 100 mg by mouth 2 (two) times daily.      cyclobenzaprine (FLEXERIL) 5 MG tablet Take 5 mg by mouth at bedtime.     cycloSPORINE (RESTASIS) 0.05 % ophthalmic emulsion Place 1 drop into both eyes 2 (two) times daily.     Docusate Sodium (DSS) 100 MG CAPS Take 100 mg by mouth daily as needed (constipation).     doxycycline (VIBRAMYCIN) 100 MG capsule Take 1 capsule (100 mg total) by mouth 2 (two) times daily. 20 capsule 0   DULoxetine (CYMBALTA) 60 MG capsule Take 60 mg by mouth 2 (two) times daily.      escitalopram (LEXAPRO) 10 MG tablet Take 10 mg by mouth daily.     ezetimibe (ZETIA) 10 MG tablet Take 10 mg by mouth daily.     feeding supplement (ENSURE ENLIVE / ENSURE PLUS) LIQD Take 237 mLs by mouth  2 (two) times daily between meals. 237 mL 12   fluticasone (FLONASE) 50 MCG/ACT nasal spray Place 2 sprays into both nostrils daily.     Fluticasone-Salmeterol (ADVAIR) 250-50 MCG/DOSE AEPB Inhale 1 puff into the lungs as needed.      hydrOXYzine (ATARAX/VISTARIL) 25 MG tablet Take 1 tablet by mouth as needed for anxiety or itching.      linaclotide (LINZESS) 145 MCG CAPS capsule Take 145 mcg by mouth daily before breakfast.     LORazepam (ATIVAN) 1 MG tablet Take 1 mg by mouth every 8 (eight) hours as needed for anxiety. Reported on 11/19/2015     lovastatin (MEVACOR) 10 MG tablet Take 10 mg by mouth daily.     lubiprostone (AMITIZA) 24 MCG capsule TAKE 1 CAPSULE BY MOUTH TWICE DAILY WITH A MEAL. 60 capsule 0   mupirocin cream (BACTROBAN) 2 % Apply 1 Application topically 2 (two) times daily. 15 g 0   oxyCODONE-acetaminophen (PERCOCET) 10-325 MG tablet Take 1 tablet by mouth as needed.     Pancrelipase, Lip-Prot-Amyl, (CREON) 24000-76000 units CPEP TAKE 2 CAPSULES WITH EACH MEALS AND 1 CAPSULE WITH SNACKS. MAX 2 SNACKS PER DAY. 240 capsule 3   pantoprazole (PROTONIX) 40 MG tablet Take 1 tablet (40 mg  total) by mouth 2 (two) times daily before a meal. 9 tablet 3   potassium chloride (KLOR-CON) 10 MEQ tablet Take 10 mEq by mouth daily.     potassium chloride SA (K-DUR,KLOR-CON) 20 MEQ tablet Take 20 mEq by mouth daily.     PROLIA 60 MG/ML SOSY injection Inject 60 mg into the skin every 6 (six) months.      promethazine (PHENERGAN) 25 MG tablet Take 25 mg by mouth as needed for nausea or vomiting.      triamcinolone cream (KENALOG) 0.1 % Apply 1 Application topically 2 (two) times daily. 30 g 0   Vitamin D, Ergocalciferol, (DRISDOL) 1.25 MG (50000 UNIT) CAPS capsule Take 50,000 Units by mouth every 7 (seven) days.      Allergies as of 07/11/2023 - Review Complete 07/11/2023  Allergen Reaction Noted   Ibuprofen Anaphylaxis and Hives 12/19/2008   Iohexol Hives and Swelling 05/23/2004   Other  Itching 11/09/2015   Strawberry (diagnostic) Itching 11/09/2015   Strawberry extract Itching 09/29/2017    Family History  Problem Relation Age of Onset   Heart attack Father 30   Colon cancer Maternal Grandmother     Social History   Socioeconomic History   Marital status: Divorced    Spouse name: Not on file   Number of children: 3   Years of education: Not on file   Highest education level: Not on file  Occupational History   Occupation: disabled    Employer: UNEMPLOYED  Tobacco Use   Smoking status: Every Day    Current packs/day: 0.75    Average packs/day: 0.8 packs/day for 61.0 years (45.7 ttl pk-yrs)    Types: Cigarettes    Start date: 07/18/1962   Smokeless tobacco: Never   Tobacco comments:    3/4 pack daily  Vaping Use   Vaping status: Never Used  Substance and Sexual Activity   Alcohol use: No    Alcohol/week: 0.0 standard drinks of alcohol    Comment: hx of ETOH about 20 years ago.    Drug use: No   Sexual activity: Never    Birth control/protection: Surgical  Other Topics Concern   Not on file  Social History Narrative   Not on file   Social Determinants of Health   Financial Resource Strain: Not on file  Food Insecurity: No Food Insecurity (06/22/2022)   Hunger Vital Sign    Worried About Running Out of Food in the Last Year: Never true    Ran Out of Food in the Last Year: Never true  Transportation Needs: No Transportation Needs (06/22/2022)   PRAPARE - Administrator, Civil Service (Medical): No    Lack of Transportation (Non-Medical): No  Physical Activity: Not on file  Stress: Not on file  Social Connections: Not on file  Intimate Partner Violence: Not At Risk (06/22/2022)   Humiliation, Afraid, Rape, and Kick questionnaire    Fear of Current or Ex-Partner: No    Emotionally Abused: No    Physically Abused: No    Sexually Abused: No    Review of Systems:  As in history of present illness  Physical Exam: Vital signs in  last 24 hours: Temp:  [97.6 F (36.4 C)-98.9 F (37.2 C)] 98.9 F (37.2 C) (10/13 0833) Pulse Rate:  [61-79] 67 (10/13 0833) Resp:  [13-22] 21 (10/13 0833) BP: (100-143)/(52-112) 119/67 (10/13 0833) SpO2:  [92 %-97 %] 93 % (10/13 0833) Weight:  [33.5 kg] 33.5 kg (10/13 0345)  General:   Alert, chronically malnourished appearing pleasant and cooperative in NAD; accompanied by her sister, Dawn Lungs:  Clear throughout to auscultation.   No wheezes, crackles, or rhonchi. No acute distress. Heart:  Regular rate and rhythm; no murmurs, clicks, rubs,  or gallops. Abdomen: Flat.  Positive bowel sounds soft and nontender without obvious mass organomegaly Intake/Output from previous day: No intake/output data recorded. Intake/Output this shift: No intake/output data recorded.  Lab Results: Recent Labs    07/11/23 0403 07/11/23 0633  WBC 27.6* 24.2*  HGB 13.4 12.8  HCT 40.2 37.7  PLT 316 275   BMET Recent Labs    07/11/23 0403  NA 137  K 3.3*  CL 100  CO2 23  GLUCOSE 189*  BUN 23  CREATININE 0.63  CALCIUM 9.2   LFT Recent Labs    07/11/23 0403  PROT 6.9  ALBUMIN 4.2  AST 37  ALT 28  ALKPHOS 81  BILITOT 0.7   PT/INR Recent Labs    07/11/23 0403  LABPROT 13.4  INR 1.0   Hepatitis Panel No results for input(s): "HEPBSAG", "HCVAB", "HEPAIGM", "HEPBIGM" in the last 72 hours. C-Diff No results for input(s): "CDIFFTOX" in the last 72 hours.  Studies/Results: CT ABDOMEN PELVIS WO CONTRAST  Result Date: 07/11/2023 CLINICAL DATA:  77 year old female with history of nausea, vomiting and diarrhea for 2 days with blood in stool since yesterday evening. EXAM: CT ABDOMEN AND PELVIS WITHOUT CONTRAST TECHNIQUE: Multidetector CT imaging of the abdomen and pelvis was performed following the standard protocol without IV contrast. RADIATION DOSE REDUCTION: This exam was performed according to the departmental dose-optimization program which includes automated exposure control,  adjustment of the mA and/or kV according to patient size and/or use of iterative reconstruction technique. COMPARISON:  CT of the abdomen and pelvis 06/21/2022. FINDINGS: Lower chest: Advanced emphysematous changes are noted throughout the visualize lung bases. Atherosclerotic calcifications in the thoracic aorta. Hepatobiliary: No definite suspicious cystic or solid hepatic lesions are confidently identified on today's noncontrast CT examination. Unenhanced appearance of the gallbladder is unremarkable. Pancreas: No definite pancreatic mass or peripancreatic fluid collections or inflammatory changes are noted on today's noncontrast CT examination. Spleen: Unremarkable. Adrenals/Urinary Tract: Unenhanced appearance of the kidneys and bilateral adrenal glands is normal. No hydroureteronephrosis. Urinary bladder is are unremarkable in appearance. Stomach/Bowel: Unenhanced appearance of the stomach is unremarkable. No pathologic dilatation of small bowel or colon. The appendix is not confidently identified and may be surgically absent. Regardless, there are no inflammatory changes noted adjacent to the cecum to suggest the presence of an acute appendicitis at this time. Vascular/Lymphatic: Extensive atherosclerosis throughout the abdominal aorta and pelvic vasculature. No lymphadenopathy noted in the abdomen or pelvis. Reproductive: Status post hysterectomy. Ovaries are not confidently identified may be surgically absent or atrophic. Other: No significant volume of ascites.  No pneumoperitoneum. Musculoskeletal: Compression fracture of superior endplate of L2, new compared to the prior study, with approximately 20% loss of anterior vertebral body height. Fracture line is still visible, although there appears to be some bony resorption associated with the fracture, suggesting a subacute injury. No surrounding soft tissue swelling. There are no aggressive appearing lytic or blastic lesions noted in the visualized  portions of the skeleton. IMPRESSION: 1. New but likely subacute fracture of the anterior aspect of the superior endplate of L2 with 20% loss of anterior vertebral body height. 2. No other acute findings are noted in the abdomen or pelvis to account for the patient's symptoms on today's noncontrast  CT examination. 3. Advanced emphysema in the visualized lung bases. 4. Aortic atherosclerosis. 5. Additional incidental findings, as above. Electronically Signed   By: Trudie Reed M.D.   On: 07/11/2023 05:16    Impression: 77 year old lady with advanced chronic pancreatitis, secondary to failure to thrive, opioid-induced constipation advanced COPD admitted to the hospital with SIRS syndrome characterized by leukocytosis, elevated lactic acid along with acute GI symptoms including nausea, vomiting, diarrhea and rectal bleeding.   She has remained hemodynamically stable.  Hemoglobin near baseline.  Lactic acid level has normalized.  Marked improvement in GI symptoms over the past 24 hours. Unasyn on board.  Blood cultures taken. This lady actually appears better than when I last saw her in the office a couple years ago.  Etiology of her acute illness is not well-defined at this time.  AnS acute foodborne illness with associated anorectal bleeding remains in the differential.  She denies any superimposed acute abdominal pain and no CT evidence of colitis makes a bout of ischemic colitis less likely. At baseline, she seems to be doing fairly well from a standpoint of chronic calcific pancreatitis.  Her weight is up 7 pounds since she was last seen in office a couple of years ago which is a significant improvement.  Needs an updated colonoscopy, most likely will be elective at a later date, given history of colon polyps and history of recent rectal bleeding.  Recommendations:  -Agree with symptomatic treatment -Continue PPI, pancreatic enzymes -Advance diet as tolerated. -If recurrent diarrhea, consider C.  difficile testing -Agree with IV antibiotics empirically at this time. -We will reassess tomorrow morning.   Notice:  This dictation was prepared with Dragon dictation along with smaller phrase technology. Any transcriptional errors that result from this process are unintentional and may not be corrected upon review.

## 2023-07-11 NOTE — Assessment & Plan Note (Signed)
-  Possibly due to gastroenteritis, Hemoccult positive x 2 -Continue gentle hydration -Stool studies as needed

## 2023-07-11 NOTE — ED Provider Notes (Signed)
[place] EMERGENCY DEPARTMENT AT [facility] Provider Note   CSN: [id number] Arrival date & time: 07/11/23  4098     History  Chief Complaint  Patient presents with   Nausea   Emesis   Rectal Bleeding    [name] is a 77 y.o. female.  Presents to the emergency ferment for evaluation of abdominal pain and rectal bleeding.  Patient reports a history of pancreatitis and is concerned that this may be recurrent pancreatitis.       Home Medications Prior to Admission medications   Medication Sig Start Date End Date Taking? Authorizing Provider  acetaminophen (TYLENOL) 325 MG tablet Take 325 mg by mouth every 6 (six) hours as needed for mild pain. 08/31/17   [provider]  albuterol (PROVENTIL HFA;VENTOLIN HFA) 108 (90 Base) MCG/ACT inhaler Inhale 2 puffs into the lungs every 4 (four) hours as needed for wheezing or shortness of breath. 02/12/18   Samuel Jester, DO  Calcium Carbonate (CALCIUM 600 PO)     [provider]  cilostazol (PLETAL) 100 MG tablet Take 100 mg by mouth 2 (two) times daily.  03/29/15   [provider]  cyclobenzaprine (FLEXERIL) 5 MG tablet Take 5 mg by mouth at bedtime. 05/31/18   [provider]  cycloSPORINE (RESTASIS) 0.05 % ophthalmic emulsion Place 1 drop into both eyes 2 (two) times daily. 03/27/16   [provider]  Docusate Sodium (DSS) 100 MG CAPS Take 100 mg by mouth daily as needed (constipation). 01/03/21   [provider]  doxycycline (VIBRAMYCIN) 100 MG capsule Take 1 capsule (100 mg total) by mouth 2 (two) times daily. 07/03/23   Eber Hong, MD  DULoxetine (CYMBALTA) 60 MG capsule Take 60 mg by mouth 2 (two) times daily.     [provider]  escitalopram (LEXAPRO) 10 MG tablet Take 10 mg by mouth daily. 01/30/17   [provider]  ezetimibe (ZETIA) 10 MG tablet Take 10 mg by mouth daily. 06/11/23   [provider]  feeding supplement (ENSURE ENLIVE /  ENSURE PLUS) LIQD Take 237 mLs by mouth 2 (two) times daily between meals. 06/26/22   Sherryll Burger, Pratik D, DO  fluticasone (FLONASE) 50 MCG/ACT nasal spray Place 2 sprays into both nostrils daily. 01/13/23   [provider]  Fluticasone-Salmeterol (ADVAIR) 250-50 MCG/DOSE AEPB Inhale 1 puff into the lungs as needed.     [provider]  hydrOXYzine (ATARAX/VISTARIL) 25 MG tablet Take 1 tablet by mouth as needed for anxiety or itching.  04/21/16   [provider]  linaclotide (LINZESS) 145 MCG CAPS capsule Take 145 mcg by mouth daily before breakfast. 04/16/17   [provider]  LORazepam (ATIVAN) 1 MG tablet Take 1 mg by mouth every 8 (eight) hours as needed for anxiety. Reported on 11/19/2015    [provider]  lovastatin (MEVACOR) 10 MG tablet Take 10 mg by mouth daily. 06/23/23   [provider]  lubiprostone (AMITIZA) 24 MCG capsule TAKE 1 CAPSULE BY MOUTH TWICE DAILY WITH A MEAL. 02/25/23   Gelene Mink, NP  mupirocin cream (BACTROBAN) 2 % Apply 1 Application topically 2 (two) times daily. 06/15/22   Eber Hong, MD  oxyCODONE-acetaminophen (PERCOCET) 10-325 MG tablet Take 1 tablet by mouth as needed. 12/27/20   [provider]  Pancrelipase, Lip-Prot-Amyl, (CREON) 24000-76000 units CPEP TAKE 2 CAPSULES WITH EACH MEALS AND 1 CAPSULE WITH SNACKS. MAX 2 SNACKS PER DAY. 11/10/22   Gelene Mink, NP  pantoprazole (PROTONIX) 40 MG tablet Take 1 tablet (40 mg total) by mouth 2 (two) times daily before a meal. 07/16/21   Gelene Mink, NP  potassium chloride (KLOR-CON) 10 MEQ tablet Take 10 mEq by mouth daily. 06/23/23   [provider]  potassium chloride SA (K-DUR,KLOR-CON) 20 MEQ tablet Take 20 mEq by mouth daily.    [provider]  PROLIA 60 MG/ML SOSY injection Inject 60 mg into the skin every 6 (six) months.  05/31/18   [provider]  promethazine (PHENERGAN) 25 MG tablet Take 25 mg by mouth as needed for nausea or  vomiting.     [provider]  triamcinolone cream (KENALOG) 0.1 % Apply 1 Application topically 2 (two) times daily. 07/03/23   Eber Hong, MD  Vitamin D, Ergocalciferol, (DRISDOL) 1.25 MG (50000 UNIT) CAPS capsule Take 50,000 Units by mouth every 7 (seven) days. 08/31/17   [provider]      Allergies    Ibuprofen, Iohexol, Other, Strawberry (diagnostic), and Strawberry extract    Review of Systems   Review of Systems  Physical Exam Updated Vital Signs BP (!) 134/112 (BP Location: Right Arm)   Pulse 79   Temp 97.6 F (36.4 C) (Oral)   Resp 20   Ht 5\' 4"  (1.626 m)   Wt 33.5 kg   SpO2 97%   BMI 12.68 kg/m  Physical Exam Vitals and nursing note reviewed.  Constitutional:      General: She is not in acute distress.    Appearance: She is underweight.  HENT:     Head: Normocephalic and atraumatic.     Mouth/Throat:     Mouth: Mucous membranes are moist.  Eyes:     General: Vision grossly intact. Gaze aligned appropriately.     Extraocular Movements: Extraocular movements intact.     Conjunctiva/sclera: Conjunctivae normal.  Cardiovascular:     Rate and Rhythm: Normal rate and regular rhythm.     Pulses: Normal pulses.     Heart sounds: Normal heart sounds, S1 normal and S2 normal. No murmur heard.    No friction rub. No gallop.  Pulmonary:     Effort: Pulmonary effort is normal. No respiratory distress.     Breath sounds: Normal breath sounds.  Abdominal:     General: Bowel sounds are normal.     Palpations: Abdomen is soft.     Tenderness: There is generalized abdominal tenderness. There is no guarding or rebound.     Hernia: No hernia is present.  Musculoskeletal:        General: No swelling.     Cervical back: Full passive range of motion without pain, normal range of motion and neck supple. No spinous process tenderness or muscular tenderness. Normal range of motion.     Right lower leg: No edema.     Left lower leg: No edema.  Skin:     General: Skin is warm and dry.     Capillary Refill: Capillary refill takes less than 2 seconds.     Findings: No ecchymosis, erythema, rash or wound.  Neurological:     General: No focal deficit present.     Mental Status: She is alert and oriented to person, place, and time.     GCS: GCS eye subscore is 4. GCS verbal subscore is 5. GCS motor subscore is 6.     Cranial Nerves: Cranial nerves 2-12 are intact.     Sensory: Sensation is intact.  Motor: Motor function is intact.     Coordination: Coordination is intact.  Psychiatric:        Attention and Perception: Attention normal.        Mood and Affect: Mood normal.        Speech: Speech normal.        Behavior: Behavior normal.     ED Results / Procedures / Treatments   Labs (all labs ordered are listed, but only abnormal results are displayed) Labs Reviewed  CBC WITH DIFFERENTIAL/PLATELET - Abnormal; Notable for the following components:      Result Value   WBC 27.6 (*)    Neutro Abs 22.1 (*)    Monocytes Absolute 4.1 (*)    Abs Immature Granulocytes 0.35 (*)    All other components within normal limits  COMPREHENSIVE METABOLIC PANEL - Abnormal; Notable for the following components:   Potassium 3.3 (*)    Glucose, Bld 189 (*)    All other components within normal limits  LACTIC ACID, PLASMA - Abnormal; Notable for the following components:   Lactic Acid, Venous 3.9 (*)    All other components within normal limits  POC OCCULT BLOOD, ED - Abnormal; Notable for the following components:   Fecal Occult Bld POSITIVE (*)    All other components within normal limits  GASTROINTESTINAL PANEL BY PCR, STOOL (REPLACES STOOL CULTURE)  LIPASE, BLOOD  MAGNESIUM  PROTIME-INR  TYPE AND SCREEN    EKG EKG Interpretation Date/Time:  Sunday July 11 2023 04:22:14 EDT Ventricular Rate:  55 PR Interval:  196 QRS Duration:  110 QT Interval:  486 QTC Calculation: 465 R Axis:   68  Text Interpretation: Sinus rhythm Anteroseptal  infarct, age indeterminate Confirmed by Gilda Crease [phone]) on 07/11/2023 4:24:47 AM  Radiology CT ABDOMEN PELVIS WO CONTRAST  Result Date: 07/11/2023 CLINICAL DATA:  77 year old female with history of nausea, vomiting and diarrhea for 2 days with blood in stool since yesterday evening. EXAM: CT ABDOMEN AND PELVIS WITHOUT CONTRAST TECHNIQUE: Multidetector CT imaging of the abdomen and pelvis was performed following the standard protocol without IV contrast. RADIATION DOSE REDUCTION: This exam was performed according to the departmental dose-optimization program which includes automated exposure control, adjustment of the mA and/or kV according to patient size and/or use of iterative reconstruction technique. COMPARISON:  CT of the abdomen and pelvis 06/21/2022. FINDINGS: Lower chest: Advanced emphysematous changes are noted throughout the visualize lung bases. Atherosclerotic calcifications in the thoracic aorta. Hepatobiliary: No definite suspicious cystic or solid hepatic lesions are confidently identified on today's noncontrast CT examination. Unenhanced appearance of the gallbladder is unremarkable. Pancreas: No definite pancreatic mass or peripancreatic fluid collections or inflammatory changes are noted on today's noncontrast CT examination. Spleen: Unremarkable. Adrenals/Urinary Tract: Unenhanced appearance of the kidneys and bilateral adrenal glands is normal. No hydroureteronephrosis. Urinary bladder is are unremarkable in appearance. Stomach/Bowel: Unenhanced appearance of the stomach is unremarkable. No pathologic dilatation of small bowel or colon. The appendix is not confidently identified and may be surgically absent. Regardless, there are no inflammatory changes noted adjacent to the cecum to suggest the presence of an acute appendicitis at this time. Vascular/Lymphatic: Extensive atherosclerosis throughout the abdominal aorta and pelvic vasculature. No lymphadenopathy noted in the  abdomen or pelvis. Reproductive: Status post hysterectomy. Ovaries are not confidently identified may be surgically absent or atrophic. Other: No significant volume of ascites.  No pneumoperitoneum. Musculoskeletal: Compression fracture of superior endplate of L2, new compared to the prior study, with approximately 20%  loss of anterior vertebral body height. Fracture line is still visible, although there appears to be some bony resorption associated with the fracture, suggesting a subacute injury. No surrounding soft tissue swelling. There are no aggressive appearing lytic or blastic lesions noted in the visualized portions of the skeleton. IMPRESSION: 1. New but likely subacute fracture of the anterior aspect of the superior endplate of L2 with 20% loss of anterior vertebral body height. 2. No other acute findings are noted in the abdomen or pelvis to account for the patient's symptoms on today's noncontrast CT examination. 3. Advanced emphysema in the visualized lung bases. 4. Aortic atherosclerosis. 5. Additional incidental findings, as above. Electronically Signed   By: Trudie Reed M.D.   On: 07/11/2023 05:16    Procedures Procedures    Medications Ordered in ED Medications  ampicillin-sulbactam (UNASYN) 1.5 g in sodium chloride 0.9 % 100 mL IVPB (has no administration in time range)  sodium chloride 0.9 % bolus 1,000 mL (has no administration in time range)  HYDROmorphone (DILAUDID) injection 0.5 mg (0.5 mg Intravenous Given 07/11/23 0422)  metoCLOPramide (REGLAN) injection 10 mg (10 mg Intravenous Given 07/11/23 0421)    ED Course/ Medical Decision Making/ A&P                                 Medical Decision Making Amount and/or Complexity of Data Reviewed Labs: ordered. Radiology: ordered.  Risk Prescription drug management.   Patient presents with diffuse abdominal pain and rectal bleeding.  I reviewed previous endoscopy.  Upper endoscopy did not show any evidence of ulcer  or abnormality in 2019.  Colonoscopy in 2018 was normal.  Colonoscopy in 2015 was normal except 2 polyps were removed.  Abdominal exam reveals diffuse tenderness, no signs of acute peritonitis.  Patient is not hypotensive.  She is afebrile.  Lab work reveals a significant leukocytosis.  Kidney function is normal.  Lipase is not elevated.  Hemoglobin is 13, no sign of anemia.  Lactic acid is elevated.  This combined with white blood cell count, must consider possibility of infection.  Patient could be experiencing colitis.  Administered Unasyn.  CT scan does not show any acute abnormality.  Patient is extremely thin, however, and IV contrast could not be given based on allergy.  There could be inflammatory changes that are not visible on the noncontrast CT.  Will admit patient.        Final Clinical Impression(s) / ED Diagnoses Final diagnoses:  Rectal bleeding    Rx / DC Orders ED Discharge Orders     None         Eveleigh Crumpler, Canary Brim, MD 07/11/23 [phone]

## 2023-07-11 NOTE — Assessment & Plan Note (Signed)
-   Possible source of infection intra-abdominal -Monitoring WBC, lactic acid closely -Patient started on Unasyn will be continued for now

## 2023-07-11 NOTE — ED Notes (Signed)
Pharmacy called back to inform they added Hylenex 150 units for the infiltrated L forearm IV that had potassium diluted with NaCL running in it. MD made aware

## 2023-07-11 NOTE — H&P (Signed)
History and Physical   Patient: [name]                            PCP: [name]                    DOB: 1946/09/07            DOA: 07/11/2023 ONG:[id number]             DOS: 07/11/2023, 10:26 AM  [name]  Patient coming from:   HOME  I have personally reviewed patient's medical records, in electronic medical records, including:  [facility], and care everywhere.    Chief Complaint:   Chief Complaint  Patient presents with   Nausea   Emesis   Rectal Bleeding    History of present illness:    [name] is a 77 yo female with extensive history of chronic pancreatitis, anxiety, depression, chronic back pain, CAD, cervical cancer, COPD, GERD, previous H. pylori infection, HLD, nephrolithiasis, CVA, chronic tobacco abuse, with recent history of pneumonia was treated with antibiotics and steroids.  Presented overnight for nausea vomiting and diarrhea x 2 days.  Patient noted blood in her stool which is started tonight. Patient denies having any fever, acute on chronic pain 10 out of 10 all over her body.  Per patient recently was treated with oral antibiotics and p.o. steroids for presumed pneumonia--patient believes that this likely has exacerbated her nausea, vomiting diarrhea  ED course/evaluation Blood pressure (!) 123/52, pulse 66, temperature 97.6 F (36.4 C), RR 20,  SpO2 97%. CBC 27.6, hemoglobin 13,  neutrophil 22.1, monocytes 4.1, potassium 3.3, glucose 189, lactic acid 3.9 Lipase: 29   CT abdominal/pelvic without contrast -negative any other organ findings, pancreas within normal limits IMPRESSION: 1. New but likely subacute fracture of the anterior aspect of the superior endplate of L2 with 20% loss of anterior vertebral body height. 2. No other acute findings are noted in the abdomen or pelvis to account for the patient's symptoms on today's noncontrast CT examination. 3. Advanced emphysema in the visualized lung bases. 4. Aortic  atherosclerosis.  EG-sinus rhythm within normal limits  Endoscopy evidences of ulcer in 2019, colonoscopy 2018-normal colonoscopy 2015 normal with 2 polyps removed    Patient Denies having: Fever, Chills, Cough, SOB, Chest Pain, headache, dizziness, lightheadedness,  Dysuria, Joint pain, rash, open wounds   Review of Systems: As per HPI, otherwise 10 point review of systems were negative.   ----------------------------------------------------------------------------------------------------------------------  Allergies  Allergen Reactions   Ibuprofen Anaphylaxis and Hives   Iohexol Hives and Swelling    HIVES AND SWELLING WITH I.V.P DYE, NEEDS PRE-MEDS.    Other Itching   Strawberry (Diagnostic) Itching   Strawberry Extract Itching    Home MEDs:  Prior to Admission medications   Medication Sig Start Date End Date Taking? Authorizing Provider  acetaminophen (TYLENOL) 325 MG tablet Take 325 mg by mouth every 6 (six) hours as needed for mild pain. 08/31/17   Provider, Historical, [name]  albuterol (PROVENTIL HFA;VENTOLIN HFA) 108 (90 Base) MCG/ACT inhaler Inhale 2 puffs into the lungs every 4 (four) hours as needed for wheezing or shortness of breath. 02/12/18   Samuel Jester, DO  Calcium Carbonate (CALCIUM 600 PO)     Provider, Historical, [name]  cilostazol (PLETAL) 100 MG tablet Take 100 mg by mouth 2 (two) times daily.  03/29/15   Provider, Historical, [name]  cyclobenzaprine (FLEXERIL) 5 MG tablet Take 5 mg by mouth at bedtime. 05/31/18   Provider, Historical, [name]  cycloSPORINE (RESTASIS) 0.05 % ophthalmic emulsion Place 1 drop into both eyes 2 (two) times daily. 03/27/16   Provider, Historical, [name]  Docusate Sodium (DSS) 100 MG CAPS Take 100 mg by mouth daily as needed (constipation). 01/03/21   Provider, Historical, [name]  doxycycline (VIBRAMYCIN) 100 MG capsule Take 1 capsule (100 mg total) by mouth 2 (two) times daily. 07/03/23   Eber Hong, [name]  DULoxetine (CYMBALTA) 60 MG capsule Take 60 mg by  mouth 2 (two) times daily.     Provider, Historical, [name]  escitalopram (LEXAPRO) 10 MG tablet Take 10 mg by mouth daily. 01/30/17   Provider, Historical, [name]  ezetimibe (ZETIA) 10 MG tablet Take 10 mg by mouth daily. 06/11/23   Provider, Historical, [name]  feeding supplement (ENSURE ENLIVE / ENSURE PLUS) LIQD Take 237 mLs by mouth 2 (two) times daily between meals. 06/26/22   Sherryll Burger, Pratik D, DO  fluticasone (FLONASE) 50 MCG/ACT nasal spray Place 2 sprays into both nostrils daily. 01/13/23   Provider, Historical, [name]  Fluticasone-Salmeterol (ADVAIR) 250-50 MCG/DOSE AEPB Inhale 1 puff into the lungs as needed.     Provider, Historical, [name]  hydrOXYzine (ATARAX/VISTARIL) 25 MG tablet Take 1 tablet by mouth as needed for anxiety or itching.  04/21/16   Provider, Historical, [name]  linaclotide (LINZESS) 145 MCG CAPS capsule Take 145 mcg by mouth daily before breakfast. 04/16/17   Provider, Historical, [name]  LORazepam (ATIVAN) 1 MG tablet Take 1 mg by mouth every 8 (eight) hours as needed for anxiety. Reported on 11/19/2015    Provider, Historical, [name]  lovastatin (MEVACOR) 10 MG tablet Take 10 mg by mouth daily. 06/23/23   Provider, Historical, [name]  lubiprostone (AMITIZA) 24 MCG capsule TAKE 1 CAPSULE BY MOUTH TWICE DAILY WITH A MEAL. 02/25/23   Gelene Mink, NP  mupirocin cream (BACTROBAN) 2 % Apply 1 Application topically 2 (two) times daily. 06/15/22   Eber Hong, [name]  oxyCODONE-acetaminophen (PERCOCET) 10-325 MG tablet Take 1 tablet by mouth as needed. 12/27/20   Provider, Historical, [name]  Pancrelipase, Lip-Prot-Amyl, (CREON) 24000-76000 units CPEP TAKE 2 CAPSULES WITH EACH MEALS AND 1 CAPSULE WITH SNACKS. MAX 2 SNACKS PER DAY. 11/10/22   Gelene Mink, NP  pantoprazole (PROTONIX) 40 MG tablet Take 1 tablet (40 mg total) by mouth 2 (two) times daily before a meal. 07/16/21   Gelene Mink, NP  potassium chloride (KLOR-CON) 10 MEQ tablet Take 10 mEq by mouth daily. 06/23/23   Provider, Historical, [name]  potassium chloride SA  (K-DUR,KLOR-CON) 20 MEQ tablet Take 20 mEq by mouth daily.    Provider, Historical, [name]  PROLIA 60 MG/ML SOSY injection Inject 60 mg into the skin every 6 (six) months.  05/31/18   Provider, Historical, [name]  promethazine (PHENERGAN) 25 MG tablet Take 25 mg by mouth as needed for nausea or vomiting.     Provider, Historical, [name]  triamcinolone cream (KENALOG) 0.1 % Apply 1 Application topically 2 (two) times daily. 07/03/23   Eber Hong, [name]  Vitamin D, Ergocalciferol, (DRISDOL) 1.25 MG (50000 UNIT) CAPS capsule Take 50,000 Units by mouth every 7 (seven) days. 08/31/17   Provider, Historical, [name]    PRN MEDs: sodium chloride, acetaminophen **OR** acetaminophen, hydrALAZINE, HYDROmorphone (DILAUDID) injection, ipratropium, LORazepam, metoCLOPramide (REGLAN) injection, oxyCODONE-acetaminophen **AND** oxyCODONE, promethazine, sodium chloride flush, sodium phosphate, traZODone  Past Medical History:  Diagnosis Date   Allergic rhinitis  Anxiety    Anxiety and depression    Back pain, chronic    CAD (coronary artery disease)    palpitations, dizziness, chest pain   Cervical cancer (HCC)    cervical   Chronic abdominal pain    with chronic nausea, diarrhea   Collagen vascular disease (HCC)    COPD (chronic obstructive pulmonary disease) (HCC)    Depression    GERD (gastroesophageal reflux disease)    Helicobacter pylori infection 2015   treated with prevpac   Hematochezia    History of kidney stones    Hx of colonic polyps    adenomatous   Hyperlipidemia    Lipid profile on 02/25/2011: 209, 113, 55, 132   Nephrolithiasis    Pancreatitis chronic    Renal calculus    Stroke (HCC) 3-4 yrs ago   left sided weakness   Tobacco abuse    Tubular adenoma 2015   Weight loss    CT negative for occult malignancy, negative celiac, negative adrenal insufficiency    Past Surgical History:  Procedure Laterality Date   ABDOMINAL HYSTERECTOMY     APPENDECTOMY     CATARACT EXTRACTION W/PHACO Right  05/04/2013   Procedure: CATARACT EXTRACTION PHACO AND INTRAOCULAR LENS PLACEMENT (IOC);  Surgeon: Gemma Payor, [name];  Location: [facility];  Service: Ophthalmology;  Laterality: Right;  CDE:16.17   CATARACT EXTRACTION W/PHACO Left 05/22/2013   Procedure: CATARACT EXTRACTION PHACO AND INTRAOCULAR LENS PLACEMENT (IOC);  Surgeon: Gemma Payor, [name];  Location: [facility];  Service: Ophthalmology;  Laterality: Left;  CDE:14.75   COLONOSCOPY N/A 02/08/2014   Dr.Rourk- normal rectum, colonic polyps bx=tubular adenoma   COLONOSCOPY W/ POLYPECTOMY  2006   [id number], benign gastric nodule, multiple adenomatous polyps, one with tubular morphology   COLONOSCOPY WITH PROPOFOL N/A 04/15/2017   normal   ESOPHAGOGASTRODUODENOSCOPY  2006   [id number] gastric nodule   ESOPHAGOGASTRODUODENOSCOPY N/A 02/08/2014   Dr.Rourk- abnormal stomach and gstric nodule bx= hpylori   EUS  2010   Dr. Christella Hartigan : Multiple shadowing calcifications in pancreas, consistent with Chronic Pancreatitis.  These are mainly confined to a collection of calcifications in head/uncinate pancreas. Otherwise the pancreatic parenchyma appears fairly normal.  The main pancreatic duct, CBD are both normal without stones, dilation.     Ileocolonoscopy  01/11/2009   RMR: Polyp at the splenic flexure, status post hot snare removal/ Normal rectum, tubular adenoma   PARTIAL HYSTERECTOMY       reports that she has been smoking cigarettes. She started smoking about 61 years ago. She has a 45.7 pack-year smoking history. She has never used smokeless tobacco. She reports that she does not drink alcohol and does not use drugs.   Family History  Problem Relation Age of Onset   Heart attack Father 30   Colon cancer Maternal Grandmother     Physical Exam:   Vitals:   07/11/23 0800 07/11/23 0830 07/11/23 0833 07/11/23 0912  BP: (!) 143/60  119/67 (!) 161/82  Pulse: 63 68 67 (!) 57  Resp: 13 16 (!) 21 18  Temp:   98.9 F (37.2 C)   TempSrc:   Oral   SpO2: 96%  93% 93% 98%  Weight:      Height:       Constitutional: NAD, calm, comfortable-chronically ill, cachectic lady Eyes: PERRL, lids and conjunctivae normal ENMT: Mucous membranes are moist. Posterior pharynx clear of any exudate or lesions.Normal dentition.  Neck: normal, supple, no masses, no thyromegaly Respiratory: clear to auscultation bilaterally, no  wheezing, no crackles. Normal respiratory effort. No accessory muscle use.  Cardiovascular: Regular rate and rhythm, no murmurs / rubs / gallops. No extremity edema. 2+ pedal pulses. No carotid bruits.  Abdomen: no tenderness, no masses palpated. No hepatosplenomegaly. Bowel sounds positive.  Musculoskeletal: no clubbing / cyanosis. No joint deformity upper and lower extremities. Good ROM, no contractures. Normal muscle tone.  Neurologic: CN II-XII grossly intact. Sensation intact, DTR normal. Strength 5/5 in all 4.  Psychiatric: Normal judgment and insight. Alert and oriented x 3. Normal mood.  Skin: no rashes, lesions, ulcers. No induration Decubitus/ulcers:  Wounds: per nursing documentation         Labs on admission:    I have personally reviewed following labs and imaging studies  CBC: Recent Labs  Lab 07/11/23 0403 07/11/23 0633  WBC 27.6* 24.2*  NEUTROABS 22.1*  --   HGB 13.4 12.8  HCT 40.2 37.7  MCV 92.0 91.5  PLT 316 275   Basic Metabolic Panel: Recent Labs  Lab 07/11/23 0403 07/11/23 0719  NA 137  --   K 3.3*  --   CL 100  --   CO2 23  --   GLUCOSE 189*  --   BUN 23  --   CREATININE 0.63  --   CALCIUM 9.2  --   MG 1.8 1.7  PHOS  --  2.9   GFR: Estimated Creatinine Clearance: 31.6 mL/min (by C-G formula based on SCr of 0.63 mg/dL). Liver Function Tests: Recent Labs  Lab 07/11/23 0403  AST 37  ALT 28  ALKPHOS 81  BILITOT 0.7  PROT 6.9  ALBUMIN 4.2   Recent Labs  Lab 07/11/23 0403  LIPASE 29   No results for input(s): "AMMONIA" in the last 168 hours. Coagulation Profile: Recent Labs   Lab 07/11/23 0403  INR 1.0   Cardiac Enzymes: No results for input(s): "CKTOTAL", "CKMB", "CKMBINDEX", "TROPONINI" in the last 168 hours. BNP (last 3 results) No results for input(s): "PROBNP" in the last 8760 hours. HbA1C: No results for input(s): "HGBA1C" in the last 72 hours. CBG: No results for input(s): "GLUCAP" in the last 168 hours. Lipid Profile: No results for input(s): "CHOL", "HDL", "LDLCALC", "TRIG", "CHOLHDL", "LDLDIRECT" in the last 72 hours. Thyroid Function Tests: No results for input(s): "TSH", "T4TOTAL", "FREET4", "T3FREE", "THYROIDAB" in the last 72 hours. Anemia Panel: No results for input(s): "VITAMINB12", "FOLATE", "FERRITIN", "TIBC", "IRON", "RETICCTPCT" in the last 72 hours. Urine analysis:    Component Value Date/Time   COLORURINE AMBER (A) 07/11/2023 0839   APPEARANCEUR HAZY (A) 07/11/2023 0839   LABSPEC 1.029 07/11/2023 0839   PHURINE 6.0 07/11/2023 0839   GLUCOSEU NEGATIVE 07/11/2023 0839   HGBUR LARGE (A) 07/11/2023 0839   BILIRUBINUR NEGATIVE 07/11/2023 0839   KETONESUR NEGATIVE 07/11/2023 0839   PROTEINUR 30 (A) 07/11/2023 0839   UROBILINOGEN 4.0 (H) 03/09/2014 2351   NITRITE NEGATIVE 07/11/2023 0839   LEUKOCYTESUR LARGE (A) 07/11/2023 0839    Last A1C:  No results found for: "HGBA1C"   Radiologic Exams on Admission:   CT ABDOMEN PELVIS WO CONTRAST  Result Date: 07/11/2023 CLINICAL DATA:  77 year old female with history of nausea, vomiting and diarrhea for 2 days with blood in stool since yesterday evening. EXAM: CT ABDOMEN AND PELVIS WITHOUT CONTRAST TECHNIQUE: Multidetector CT imaging of the abdomen and pelvis was performed following the standard protocol without IV contrast. RADIATION DOSE REDUCTION: This exam was performed according to the departmental dose-optimization program which includes automated exposure control, adjustment of the  mA and/or kV according to patient size and/or use of iterative reconstruction technique. COMPARISON:   CT of the abdomen and pelvis 06/21/2022. FINDINGS: Lower chest: Advanced emphysematous changes are noted throughout the visualize lung bases. Atherosclerotic calcifications in the thoracic aorta. Hepatobiliary: No definite suspicious cystic or solid hepatic lesions are confidently identified on today's noncontrast CT examination. Unenhanced appearance of the gallbladder is unremarkable. Pancreas: No definite pancreatic mass or peripancreatic fluid collections or inflammatory changes are noted on today's noncontrast CT examination. Spleen: Unremarkable. Adrenals/Urinary Tract: Unenhanced appearance of the kidneys and bilateral adrenal glands is normal. No hydroureteronephrosis. Urinary bladder is are unremarkable in appearance. Stomach/Bowel: Unenhanced appearance of the stomach is unremarkable. No pathologic dilatation of small bowel or colon. The appendix is not confidently identified and may be surgically absent. Regardless, there are no inflammatory changes noted adjacent to the cecum to suggest the presence of an acute appendicitis at this time. Vascular/Lymphatic: Extensive atherosclerosis throughout the abdominal aorta and pelvic vasculature. No lymphadenopathy noted in the abdomen or pelvis. Reproductive: Status post hysterectomy. Ovaries are not confidently identified may be surgically absent or atrophic. Other: No significant volume of ascites.  No pneumoperitoneum. Musculoskeletal: Compression fracture of superior endplate of L2, new compared to the prior study, with approximately 20% loss of anterior vertebral body height. Fracture line is still visible, although there appears to be some bony resorption associated with the fracture, suggesting a subacute injury. No surrounding soft tissue swelling. There are no aggressive appearing lytic or blastic lesions noted in the visualized portions of the skeleton. IMPRESSION: 1. New but likely subacute fracture of the anterior aspect of the superior endplate of  L2 with 20% loss of anterior vertebral body height. 2. No other acute findings are noted in the abdomen or pelvis to account for the patient's symptoms on today's noncontrast CT examination. 3. Advanced emphysema in the visualized lung bases. 4. Aortic atherosclerosis. 5. Additional incidental findings, as above. Electronically Signed   By: Trudie Reed M.D.   On: 07/11/2023 05:16    EKG:   Independently reviewed.  Orders placed or performed during the hospital encounter of 07/11/23   ED EKG   ED EKG   EKG 12-Lead   EKG 12-Lead   EKG 12-Lead   ---------------------------------------------------------------------------------------------------------------------------------------    Assessment / Plan:   Principal Problem:   GI bleed Active Problems:   Nausea & vomiting   Diarrhea   Leukocytosis   Dehydration   SIRS (systemic inflammatory response syndrome) (HCC)   COPD (chronic obstructive pulmonary disease) (HCC)   GERD   Abdominal pain   ANXIETY DEPRESSION   Chronic pancreatitis (HCC)   Hyperlipidemia   Hypokalemia   Hypomagnesemia   Assessment and Plan: * GI bleed -Associate with nausea/vomiting/diarrhea 2 episodes -Per patient reporting bloody bowel movement earlier this morning -Hemoglobin stable at 13 -will continue to monitor H&H -Hemoglobin positive x 2 -Continue with Protonix 40 mg IV twice daily -N.p.o. -GI consulted -appreciate evaluation recommendations   CT abdominal/pelvic without contrast -negative any other organ findings, pancreas within normal limits IMPRESSION: 1. New but likely subacute fracture of the anterior aspect of the superior endplate of L2 with 20% loss of anterior vertebral body height. 2. No other acute findings are noted in the abdomen or pelvis to account for the patient's symptoms on today's noncontrast CT examination. 3. Advanced emphysema in the visualized lung bases. 4. Aortic atherosclerosis.  EG-sinus rhythm within normal  limits  Endoscopy evidences of ulcer in 2019, colonoscopy 2018-normal colonoscopy 2015  normal with 2 polyps remove  SIRS (systemic inflammatory response syndrome) (HCC) - Patient meets SIRS criteria-with leukocytosis and lactic acidosis -Likely diet due to gastroenteritis  CBC 27.6, Lactic acid 3.9 Lipase: 29   CT abdominal/pelvic without contrast -negative any other organ findings, pancreas within normal limits IMPRESSION: 1. New but likely subacute fracture of the anterior aspect of the superior endplate of L2 with 20% loss of anterior vertebral body height. 2. No other acute findings are noted in the abdomen or pelvis to account for the patient's symptoms on today's noncontrast CT examination. 3. Advanced emphysema in the visualized lung bases. 4. Aortic atherosclerosis.  EG-sinus rhythm within normal limits  -Obtain blood cultures, and possible stool cultures -Patient started on Unasyn will be continued for now -Will continue with gentle IV fluid hydration  Dehydration -Gentle IV fluid hydration  Leukocytosis - Possible source of infection intra-abdominal -Monitoring WBC, lactic acid closely -Patient started on Unasyn will be continued for now  Diarrhea -Possibly due to gastroenteritis, Hemoccult positive x 2 -Continue gentle hydration -Stool studies as needed  Nausea & vomiting - Possible infectious process enteritis-with GI bleed -NPO, as needed Reglan -Continue IV fluids -N.p.o. for now  Abdominal pain - Acute on chronic pain, associated abdominal pain no nausea vomiting diarrhea -Will continue with as needed IV/p.o. analgesics  GERD - Initiating Protonix IV 40 mg twice daily  COPD (chronic obstructive pulmonary disease) (HCC) - Currently stable, on 2 L of oxygen -Monitor closely continue as needed DuoNeb bronchodilator inhalers  Chronic pancreatitis (HCC) - CT abdomen pelvis regarding pancreatic contour within normal limits -Lipase level within normal  limits -No signs of acute on chronic pancreatitis  ANXIETY DEPRESSION -As needed Ativan, Home medication reviewed, Cymbalta, Lexapro, Atarax and home medication of Ativan reviewed Will be resumed accordingly  Hypomagnesemia - Repleting with 2 g IV  Hypokalemia -Monitoring - Repleting p.o./IV  Hyperlipidemia - Continue statin -N.p.o., will be reinitiated in a.m.   Consults called:  GI  -------------------------------------------------------------------------------------------------------------------------------------------- DVT prophylaxis:  TED hose Start: 07/11/23 0719 SCDs Start: 07/11/23 0719   Code Status:   Code Status: Full Code   Admission status: Patient will be admitted as Observation, with a greater than 2 midnight length of stay. Level of care: Telemetry   Family Communication:  none at bedside  (The above findings and plan of care has been discussed with patient in detail, the patient expressed understanding and agreement of above plan)  --------------------------------------------------------------------------------------------------------------------------------------------------  Disposition Plan:  Anticipated 1-2 days Status is: Observation The patient remains OBS appropriate and will d/c before 2 midnights.     ----------------------------------------------------------------------------------------------------------------------------------------------------  Time spent:  55  Min.  Was spent seeing and evaluating the patient, reviewing all medical records, drawn plan of care.  SIGNED: Kendell Bane, [name], FHM. FAAFP. [place] [facility], Pager  (Please use [email or web address] to page/ or secure chat through epic) If 7PM-7AM, please contact night-coverage [email or web address],  07/11/2023, 10:26 AM

## 2023-07-11 NOTE — Progress Notes (Signed)
   07/11/23 0843  TOC Brief Assessment  Insurance and Status Reviewed  Patient has primary care physician No (Added PCP list to AVS)  Home environment has been reviewed From home  Prior level of function: Independent  Prior/Current Home Services No current home services  Social Determinants of Health Reivew SDOH reviewed no interventions necessary  Readmission risk has been reviewed Yes  Transition of care needs transition of care needs identified, TOC will continue to follow (Open consult for possible DME needs.)

## 2023-07-11 NOTE — Assessment & Plan Note (Signed)
-  Still reporting rectal bleed -H&H remained stable, GI following, recommending possible inpatient colonoscopy in a.m. 07/14/23  -Still complaining of nausea, but no vomiting diarrhea has improved -1 bowel movement since admission, reporting diarrhea has improved -Hemoglobin stable at 13 ->> 12.8 -Hemoglobin positive x 2 -Continue with Protonix 40 mg IV twice daily -GI started diet advancing slowly -GI consulted -recommending no intervention at this point   CT abdominal/pelvic without contrast -negative any other organ findings, pancreas within normal limits IMPRESSION: 1. New but likely subacute fracture of the anterior aspect of the superior endplate of L2 with 20% loss of anterior vertebral body height. 2. No other acute findings are noted in the abdomen or pelvis to account for the patient's symptoms on today's noncontrast CT examination. 3. Advanced emphysema in the visualized lung bases. 4. Aortic atherosclerosis.  EG-sinus rhythm within normal limits  Endoscopy evidences of ulcer in 2019, colonoscopy 2018-normal colonoscopy 2015 normal with 2 polyps remove

## 2023-07-11 NOTE — Assessment & Plan Note (Signed)
-  Improved abdominal pain Improved nausea vomiting -Improved diarrhea -Will continue with as needed IV/p.o. analgesics

## 2023-07-11 NOTE — ED Notes (Signed)
IV access reestablished, 22 G in RUA, blood return and flushes without any resistance

## 2023-07-11 NOTE — Assessment & Plan Note (Signed)
-   Currently stable, on 2 L of oxygen -Monitor closely continue as needed DuoNeb bronchodilator inhalers

## 2023-07-11 NOTE — Discharge Instructions (Signed)

## 2023-07-11 NOTE — ED Notes (Signed)
ED TO INPATIENT HANDOFF REPORT  ED Nurse Name and Phone #: Jacques Earthly Name/Age/Gender [name] 77 y.o. female Room/Bed: APA19/APA19  Code Status   Code Status: Prior  Home/SNF/Other Home Patient oriented to: self, place, time, and situation Is this baseline? Yes   Triage Complete: Triage complete  Chief Complaint GI bleed [K92.2]  Triage Note Patient from home for N/V for 2 days and diarrhea with blood in stool that started tonight. Patient reports being recently treated for pneumonia with abx and steroids but unable to keep them down. Patient has a history of pancreatitis. EMS placed a 20G IV in the LFA. Upon arrival to ER, patient is alert and oriented, reports 10/10 pain all over; bloody stool noted on exterior of patient's rectum during provider exam   Allergies Allergies  Allergen Reactions   Ibuprofen Anaphylaxis and Hives   Iohexol Hives and Swelling    HIVES AND SWELLING WITH I.V.P DYE, NEEDS PRE-MEDS.    Other Itching   Strawberry (Diagnostic) Itching   Strawberry Extract Itching    Level of Care/Admitting Diagnosis ED Disposition     ED Disposition  Admit   Condition  --   Comment  Hospital Area: [facility] [[id number]]  Level of Care: Telemetry [5]  Covid Evaluation: Asymptomatic - no recent exposure (last 10 days) testing not required  Diagnosis: GI bleed [161096]  Admitting Physician: Lilyan Gilford [[id number]]  Attending Physician: Lilyan Gilford [[id number]]          B Medical/Surgery History Past Medical History:  Diagnosis Date   Allergic rhinitis    Anxiety    Anxiety and depression    Back pain, chronic    CAD (coronary artery disease)    palpitations, dizziness, chest pain   Cervical cancer (HCC)    cervical   Chronic abdominal pain    with chronic nausea, diarrhea   Collagen vascular disease (HCC)    COPD (chronic obstructive pulmonary disease) (HCC)    Depression    GERD (gastroesophageal reflux  disease)    Helicobacter pylori infection 2015   treated with prevpac   Hematochezia    History of kidney stones    Hx of colonic polyps    adenomatous   Hyperlipidemia    Lipid profile on 02/25/2011: 209, 113, 55, 132   Nephrolithiasis    Pancreatitis chronic    Renal calculus    Stroke (HCC) 3-4 yrs ago   left sided weakness   Tobacco abuse    Tubular adenoma 2015   Weight loss    CT negative for occult malignancy, negative celiac, negative adrenal insufficiency   Past Surgical History:  Procedure Laterality Date   ABDOMINAL HYSTERECTOMY     APPENDECTOMY     CATARACT EXTRACTION W/PHACO Right 05/04/2013   Procedure: CATARACT EXTRACTION PHACO AND INTRAOCULAR LENS PLACEMENT (IOC);  Surgeon: Gemma Payor, MD;  Location: [facility];  Service: Ophthalmology;  Laterality: Right;  CDE:16.17   CATARACT EXTRACTION W/PHACO Left 05/22/2013   Procedure: CATARACT EXTRACTION PHACO AND INTRAOCULAR LENS PLACEMENT (IOC);  Surgeon: Gemma Payor, MD;  Location: [facility];  Service: Ophthalmology;  Laterality: Left;  CDE:14.75   COLONOSCOPY N/A 02/08/2014   Dr.Rourk- normal rectum, colonic polyps bx=tubular adenoma   COLONOSCOPY W/ POLYPECTOMY  2006   GNF:AOZHYQMVHQI, benign gastric nodule, multiple adenomatous polyps, one with tubular morphology   COLONOSCOPY WITH PROPOFOL N/A 04/15/2017   normal   ESOPHAGOGASTRODUODENOSCOPY  2006   ONG:EXBMWU gastric nodule   ESOPHAGOGASTRODUODENOSCOPY N/A 02/08/2014  Dr.Rourk- abnormal stomach and gstric nodule bx= hpylori   EUS  2010   Dr. Christella Hartigan : Multiple shadowing calcifications in pancreas, consistent with Chronic Pancreatitis.  These are mainly confined to a collection of calcifications in head/uncinate pancreas. Otherwise the pancreatic parenchyma appears fairly normal.  The main pancreatic duct, CBD are both normal without stones, dilation.     Ileocolonoscopy  01/11/2009   RMR: Polyp at the splenic flexure, status post hot snare removal/ Normal rectum, tubular  adenoma   PARTIAL HYSTERECTOMY       A IV Location/Drains/Wounds Patient Lines/Drains/Airways Status     Active Line/Drains/Airways     Name Placement date Placement time Site Days   Peripheral IV 07/11/23 Anterior;Left Forearm 07/11/23  0334  Forearm  less than 1            Intake/Output Last 24 hours No intake or output data in the 24 hours ending 07/11/23 4098  Labs/Imaging Results for orders placed or performed during the hospital encounter of 07/11/23 (from the past 48 hour(s))  POC occult blood, ED     Status: Abnormal   Collection Time: 07/11/23  3:52 AM  Result Value Ref Range   Fecal Occult Bld POSITIVE (A) NEGATIVE  CBC with Differential/Platelet     Status: Abnormal   Collection Time: 07/11/23  4:03 AM  Result Value Ref Range   WBC 27.6 (H) 4.0 - 10.5 K/uL   RBC 4.37 3.87 - 5.11 MIL/uL   Hemoglobin 13.4 12.0 - 15.0 g/dL   HCT 11.9 14.7 - 82.9 %   MCV 92.0 80.0 - 100.0 fL   MCH 30.7 26.0 - 34.0 pg   MCHC 33.3 30.0 - 36.0 g/dL   RDW 56.2 13.0 - 86.5 %   Platelets 316 150 - 400 K/uL   nRBC 0.0 0.0 - 0.2 %   Neutrophils Relative % 80 %   Neutro Abs 22.1 (H) 1.7 - 7.7 K/uL   Lymphocytes Relative 4 %   Lymphs Abs 1.0 0.7 - 4.0 K/uL   Monocytes Relative 15 %   Monocytes Absolute 4.1 (H) 0.1 - 1.0 K/uL   Eosinophils Relative 0 %   Eosinophils Absolute 0.0 0.0 - 0.5 K/uL   Basophils Relative 0 %   Basophils Absolute 0.0 0.0 - 0.1 K/uL   Immature Granulocytes 1 %   Abs Immature Granulocytes 0.35 (H) 0.00 - 0.07 K/uL    Comment: Performed at [facility], [address] [place], [place] [address]  Comprehensive metabolic panel     Status: Abnormal   Collection Time: 07/11/23  4:03 AM  Result Value Ref Range   Sodium 137 135 - 145 mmol/L   Potassium 3.3 (L) 3.5 - 5.1 mmol/L   Chloride 100 98 - 111 mmol/L   CO2 23 22 - 32 mmol/L   Glucose, Bld 189 (H) 70 - 99 mg/dL    Comment: Glucose reference range applies only to samples taken after fasting for at least  8 hours.   BUN 23 8 - 23 mg/dL   Creatinine, Ser 6.29 0.44 - 1.00 mg/dL   Calcium 9.2 8.9 - 52.8 mg/dL   Total Protein 6.9 6.5 - 8.1 g/dL   Albumin 4.2 3.5 - 5.0 g/dL   AST 37 15 - 41 U/L   ALT 28 0 - 44 U/L   Alkaline Phosphatase 81 38 - 126 U/L   Total Bilirubin 0.7 0.3 - 1.2 mg/dL   GFR, Estimated >41 >32 mL/min    Comment: (NOTE) Calculated  using the CKD-EPI Creatinine Equation (2021)    Anion gap 14 5 - 15    Comment: Performed at [facility], [address] [place], [place] [address]  Lipase, blood     Status: None   Collection Time: 07/11/23  4:03 AM  Result Value Ref Range   Lipase 29 11 - 51 U/L    Comment: Performed at [facility], [address] [place], [place] [address]  Magnesium     Status: None   Collection Time: 07/11/23  4:03 AM  Result Value Ref Range   Magnesium 1.8 1.7 - 2.4 mg/dL    Comment: Performed at [facility], [address] [place], [place] [address]  Lactic acid, plasma     Status: Abnormal   Collection Time: 07/11/23  4:03 AM  Result Value Ref Range   Lactic Acid, Venous 3.9 (HH) 0.5 - 1.9 mmol/L    Comment: CRITICAL RESULT CALLED TO, READ BACK BY AND VERIFIED WITH CLINTON,A @ 0439 ON 07/11/23 BY JUW Performed at [facility], [address] [place], [place] [address]   Protime-INR     Status: None   Collection Time: 07/11/23  4:03 AM  Result Value Ref Range   Prothrombin Time 13.4 11.4 - 15.2 seconds   INR 1.0 0.8 - 1.2    Comment: (NOTE) INR goal varies based on device and disease states. Performed at [facility], [address] [place], [place] [address]   Type and screen     Status: None   Collection Time: 07/11/23  4:03 AM  Result Value Ref Range   ABO/RH(D) A POS    Antibody Screen NEG    Sample Expiration      07/14/2023,2359 Performed at [facility], [address], [place], [place] [address]    CT ABDOMEN PELVIS WO CONTRAST  Result Date: 07/11/2023 CLINICAL DATA:  77 year old female with history of nausea, vomiting  and diarrhea for 2 days with blood in stool since yesterday evening. EXAM: CT ABDOMEN AND PELVIS WITHOUT CONTRAST TECHNIQUE: Multidetector CT imaging of the abdomen and pelvis was performed following the standard protocol without IV contrast. RADIATION DOSE REDUCTION: This exam was performed according to the departmental dose-optimization program which includes automated exposure control, adjustment of the mA and/or kV according to patient size and/or use of iterative reconstruction technique. COMPARISON:  CT of the abdomen and pelvis 06/21/2022. FINDINGS: Lower chest: Advanced emphysematous changes are noted throughout the visualize lung bases. Atherosclerotic calcifications in the thoracic aorta. Hepatobiliary: No definite suspicious cystic or solid hepatic lesions are confidently identified on today's noncontrast CT examination. Unenhanced appearance of the gallbladder is unremarkable. Pancreas: No definite pancreatic mass or peripancreatic fluid collections or inflammatory changes are noted on today's noncontrast CT examination. Spleen: Unremarkable. Adrenals/Urinary Tract: Unenhanced appearance of the kidneys and bilateral adrenal glands is normal. No hydroureteronephrosis. Urinary bladder is are unremarkable in appearance. Stomach/Bowel: Unenhanced appearance of the stomach is unremarkable. No pathologic dilatation of small bowel or colon. The appendix is not confidently identified and may be surgically absent. Regardless, there are no inflammatory changes noted adjacent to the cecum to suggest the presence of an acute appendicitis at this time. Vascular/Lymphatic: Extensive atherosclerosis throughout the abdominal aorta and pelvic vasculature. No lymphadenopathy noted in the abdomen or pelvis. Reproductive: Status post hysterectomy. Ovaries are not confidently identified may be surgically absent or atrophic. Other: No significant volume of ascites.  No pneumoperitoneum. Musculoskeletal: Compression fracture  of superior endplate of L2, new compared to the prior study,  with approximately 20% loss of anterior vertebral body height. Fracture line is still visible, although there appears to be some bony resorption associated with the fracture, suggesting a subacute injury. No surrounding soft tissue swelling. There are no aggressive appearing lytic or blastic lesions noted in the visualized portions of the skeleton. IMPRESSION: 1. New but likely subacute fracture of the anterior aspect of the superior endplate of L2 with 20% loss of anterior vertebral body height. 2. No other acute findings are noted in the abdomen or pelvis to account for the patient's symptoms on today's noncontrast CT examination. 3. Advanced emphysema in the visualized lung bases. 4. Aortic atherosclerosis. 5. Additional incidental findings, as above. Electronically Signed   By: Trudie Reed M.D.   On: 07/11/2023 05:16    Pending Labs Unresulted Labs (From admission, onward)     Start     Ordered   07/11/23 0915  CBC  Now then every 6 hours,   R (with TIMED occurrences)      07/11/23 0614   07/11/23 0713  Lactic acid, plasma  (Lactic Acid)  STAT Now then every 3 hours,   R (with STAT occurrences)      07/11/23 0614   07/11/23 0615  Procalcitonin  Add-on,   AD       References:    Procalcitonin Lower Respiratory Tract Infection AND Sepsis Procalcitonin Algorithm   07/11/23 0614   07/11/23 0529  Gastrointestinal Panel by PCR , Stool  (Gastrointestinal Panel by PCR, Stool                                                                                                                                                     **Does Not include CLOSTRIDIUM DIFFICILE testing. **If CDIFF testing is needed, place order from the "C Difficile Testing" order set.**)  Once,   URGENT        07/11/23 0528            Vitals/Pain Today's Vitals   07/11/23 0345 07/11/23 0500  BP: (!) 134/112   Pulse: 79   Resp: 20   Temp: 97.6 F (36.4 C)    TempSrc: Oral   SpO2: 97%   Weight: 33.5 kg   Height: 5\' 4"  (1.626 m)   PainSc: 10-Worst pain ever 6     Isolation Precautions Enteric precautions (UV disinfection)  Medications Medications  0.9 %  sodium chloride infusion (has no administration in time range)  potassium chloride 10 mEq in 100 mL IVPB (has no administration in time range)  HYDROmorphone (DILAUDID) injection 0.5 mg (0.5 mg Intravenous Given 07/11/23 0422)  metoCLOPramide (REGLAN) injection 10 mg (10 mg Intravenous Given 07/11/23 0421)  ampicillin-sulbactam (UNASYN) 1.5 g in sodium chloride 0.9 % 100 mL IVPB (0 g Intravenous Stopped 07/11/23 0617)  sodium chloride 0.9 % bolus 1,000 mL (1,000  mLs Intravenous New Bag/Given 07/11/23 0542)    Mobility walks     Focused Assessments    R Recommendations: See Admitting Provider Note  Report given to:   Additional Notes: A&O; 20G LFA

## 2023-07-11 NOTE — ED Notes (Signed)
Hylenex injection given by EDP in L forearm

## 2023-07-11 NOTE — Assessment & Plan Note (Signed)
-   CT abdomen pelvis regarding pancreatic contour within normal limits -Lipase level within normal limits -No signs of acute on chronic pancreatitis

## 2023-07-11 NOTE — ED Notes (Signed)
Patient transported to CT 

## 2023-07-11 NOTE — Assessment & Plan Note (Signed)
-   Repleting with 2 g IV -Monitoring

## 2023-07-11 NOTE — Hospital Course (Addendum)
[name] is a 77 yo female with extensive history of chronic pancreatitis, anxiety, depression, chronic back pain, CAD, cervical cancer, COPD, GERD, previous H. pylori infection, HLD, nephrolithiasis, CVA, tobacco abuse, with recent history of pneumonia was treated with antibiotics and steroids.  Presented overnight for nausea vomiting and diarrhea x 2 days.  Patient noted blood in her stool which is started the evening prior to admission. Patient denies having any fever, acute on chronic pain 10 out of 10 all over her body.  Per patient recently was treated with oral antibiotics and p.o. steroids for presumed pneumonia--patient believes that this likely has exacerbated her nausea, vomiting diarrhea Due to her hematochezia and diarrhea, GI was consulted to assist.  ED course/evaluation Blood pressure (!) 123/52, pulse 66, temperature 97.6 F (36.4 C), RR 20,  SpO2 97%. CBC 27.6, hemoglobin 13,  neutrophil 22.1, monocytes 4.1, potassium 3.3, glucose 189, lactic acid 3.9 Lipase: 29   CT abdominal/pelvic without contrast -negative any other organ findings, pancreas within normal limits IMPRESSION: 1. New but likely subacute fracture of the anterior aspect of the superior endplate of L2 with 20% loss of anterior vertebral body height. 2. No other acute findings are noted in the abdomen or pelvis to account for the patient's symptoms on today's noncontrast CT examination. 3. Advanced emphysema in the visualized lung bases. 4. Aortic atherosclerosis.  EG-sinus rhythm within normal limits  Endoscopy evidences of ulcer in 2019, colonoscopy 2018-normal colonoscopy 2015 normal with 2 polyps removed

## 2023-07-11 NOTE — Care Management Obs Status (Signed)
MEDICARE OBSERVATION STATUS NOTIFICATION   Patient Details  Name: [name] MRN: [id number] Date of Birth: November 26, 1945   Medicare Observation Status Notification Given:  Yes    Domenic Schoenberger Marsh Dolly, LCSW 07/11/2023, 5:53 PM

## 2023-07-12 DIAGNOSIS — Z860100 Personal history of colon polyps, unspecified: Secondary | ICD-10-CM | POA: Diagnosis not present

## 2023-07-12 DIAGNOSIS — E860 Dehydration: Secondary | ICD-10-CM | POA: Diagnosis present

## 2023-07-12 DIAGNOSIS — K529 Noninfective gastroenteritis and colitis, unspecified: Secondary | ICD-10-CM | POA: Diagnosis present

## 2023-07-12 DIAGNOSIS — R197 Diarrhea, unspecified: Secondary | ICD-10-CM | POA: Diagnosis not present

## 2023-07-12 DIAGNOSIS — K625 Hemorrhage of anus and rectum: Secondary | ICD-10-CM | POA: Diagnosis not present

## 2023-07-12 DIAGNOSIS — S32029A Unspecified fracture of second lumbar vertebra, initial encounter for closed fracture: Secondary | ICD-10-CM | POA: Diagnosis present

## 2023-07-12 DIAGNOSIS — E872 Acidosis, unspecified: Secondary | ICD-10-CM | POA: Diagnosis present

## 2023-07-12 DIAGNOSIS — K219 Gastro-esophageal reflux disease without esophagitis: Secondary | ICD-10-CM | POA: Diagnosis present

## 2023-07-12 DIAGNOSIS — J439 Emphysema, unspecified: Secondary | ICD-10-CM | POA: Diagnosis present

## 2023-07-12 DIAGNOSIS — Z8249 Family history of ischemic heart disease and other diseases of the circulatory system: Secondary | ICD-10-CM | POA: Diagnosis not present

## 2023-07-12 DIAGNOSIS — F32A Depression, unspecified: Secondary | ICD-10-CM | POA: Diagnosis present

## 2023-07-12 DIAGNOSIS — R109 Unspecified abdominal pain: Secondary | ICD-10-CM | POA: Diagnosis not present

## 2023-07-12 DIAGNOSIS — G8929 Other chronic pain: Secondary | ICD-10-CM | POA: Diagnosis present

## 2023-07-12 DIAGNOSIS — X58XXXA Exposure to other specified factors, initial encounter: Secondary | ICD-10-CM | POA: Diagnosis present

## 2023-07-12 DIAGNOSIS — Z87442 Personal history of urinary calculi: Secondary | ICD-10-CM | POA: Diagnosis not present

## 2023-07-12 DIAGNOSIS — K5791 Diverticulosis of intestine, part unspecified, without perforation or abscess with bleeding: Secondary | ICD-10-CM | POA: Diagnosis not present

## 2023-07-12 DIAGNOSIS — Z8619 Personal history of other infectious and parasitic diseases: Secondary | ICD-10-CM | POA: Diagnosis not present

## 2023-07-12 DIAGNOSIS — F419 Anxiety disorder, unspecified: Secondary | ICD-10-CM | POA: Diagnosis present

## 2023-07-12 DIAGNOSIS — R6510 Systemic inflammatory response syndrome (SIRS) of non-infectious origin without acute organ dysfunction: Secondary | ICD-10-CM | POA: Diagnosis present

## 2023-07-12 DIAGNOSIS — Z8701 Personal history of pneumonia (recurrent): Secondary | ICD-10-CM | POA: Diagnosis not present

## 2023-07-12 DIAGNOSIS — K861 Other chronic pancreatitis: Secondary | ICD-10-CM | POA: Diagnosis present

## 2023-07-12 DIAGNOSIS — R112 Nausea with vomiting, unspecified: Secondary | ICD-10-CM | POA: Diagnosis present

## 2023-07-12 DIAGNOSIS — K5909 Other constipation: Secondary | ICD-10-CM | POA: Diagnosis present

## 2023-07-12 DIAGNOSIS — I2510 Atherosclerotic heart disease of native coronary artery without angina pectoris: Secondary | ICD-10-CM | POA: Diagnosis present

## 2023-07-12 DIAGNOSIS — E785 Hyperlipidemia, unspecified: Secondary | ICD-10-CM | POA: Diagnosis present

## 2023-07-12 DIAGNOSIS — F17210 Nicotine dependence, cigarettes, uncomplicated: Secondary | ICD-10-CM | POA: Diagnosis present

## 2023-07-12 DIAGNOSIS — Z8673 Personal history of transient ischemic attack (TIA), and cerebral infarction without residual deficits: Secondary | ICD-10-CM | POA: Diagnosis not present

## 2023-07-12 DIAGNOSIS — R627 Adult failure to thrive: Secondary | ICD-10-CM | POA: Diagnosis present

## 2023-07-12 DIAGNOSIS — E876 Hypokalemia: Secondary | ICD-10-CM | POA: Diagnosis present

## 2023-07-12 DIAGNOSIS — K922 Gastrointestinal hemorrhage, unspecified: Secondary | ICD-10-CM | POA: Diagnosis present

## 2023-07-12 DIAGNOSIS — I700 Atherosclerosis of aorta: Secondary | ICD-10-CM | POA: Diagnosis present

## 2023-07-12 LAB — CBC
HCT: 36.3 % (ref 36.0–46.0)
HCT: 38.7 % (ref 36.0–46.0)
Hemoglobin: 12.4 g/dL (ref 12.0–15.0)
Hemoglobin: 12.8 g/dL (ref 12.0–15.0)
MCH: 31.2 pg (ref 26.0–34.0)
MCH: 30.7 pg (ref 26.0–34.0)
MCHC: 34.2 g/dL (ref 30.0–36.0)
MCHC: 33.1 g/dL (ref 30.0–36.0)
MCV: 91.4 fL (ref 80.0–100.0)
MCV: 92.8 fL (ref 80.0–100.0)
Platelets: 214 10*3/uL (ref 150–400)
Platelets: 270 10*3/uL (ref 150–400)
RBC: 3.97 MIL/uL (ref 3.87–5.11)
RBC: 4.17 MIL/uL (ref 3.87–5.11)
RDW: 14.3 % (ref 11.5–15.5)
RDW: 14.2 % (ref 11.5–15.5)
WBC: 14.2 10*3/uL — ABNORMAL HIGH (ref 4.0–10.5)
WBC: 16.2 10*3/uL — ABNORMAL HIGH (ref 4.0–10.5)
nRBC: 0 % (ref 0.0–0.2)
nRBC: 0 % (ref 0.0–0.2)

## 2023-07-12 LAB — GLUCOSE, CAPILLARY: Glucose-Capillary: 72 mg/dL (ref 70–99)

## 2023-07-12 LAB — BASIC METABOLIC PANEL
Anion gap: 7 (ref 5–15)
BUN: 12 mg/dL (ref 8–23)
CO2: 26 mmol/L (ref 22–32)
Calcium: 8.1 mg/dL — ABNORMAL LOW (ref 8.9–10.3)
Chloride: 104 mmol/L (ref 98–111)
Creatinine, Ser: 0.41 mg/dL — ABNORMAL LOW (ref 0.44–1.00)
GFR, Estimated: >60 mL/min (ref >60)
Glucose, Bld: 79 mg/dL (ref 70–99)
Potassium: 3.4 mmol/L — ABNORMAL LOW (ref 3.5–5.1)
Sodium: 137 mmol/L (ref 135–145)

## 2023-07-12 LAB — PROTIME-INR
INR: 1.1 (ref 0.8–1.2)
Prothrombin Time: 14.4 s (ref 11.4–15.2)

## 2023-07-12 LAB — APTT: aPTT: 32 s (ref 24–36)

## 2023-07-12 MED ORDER — PANTOPRAZOLE SODIUM 40 MG PO TBEC
40.0000 mg | DELAYED_RELEASE_TABLET | Freq: Two times a day (BID) | ORAL | Status: DC
  Administered 2023-07-12 (×2): 40 mg via ORAL
  Filled 2023-07-12 (×3): qty 1

## 2023-07-12 NOTE — Assessment & Plan Note (Signed)
-   Continue monitoring appreciate GI follow-up -Possible colonoscopy in a.m. per GI

## 2023-07-12 NOTE — Progress Notes (Signed)
PROGRESS NOTE    Patient: [name]                            PCP: Benita Stabile, MD                    DOB: 08-17-1946            DOA: 07/11/2023 ZOX:[id number]             DOS: 07/12/2023, 12:43 PM   LOS: 0 days   Date of Service: The patient was seen and examined on 07/12/2023  Subjective:   The patient was seen and examined this morning. Hemodynamically stable. Still reporting of abdominal discomfort, rectal bleed overnight Reporting improved diarrhea  Brief Narrative:   [name] is a 77 yo female with extensive history of chronic pancreatitis, anxiety, depression, chronic back pain, CAD, cervical cancer, COPD, GERD, previous H. pylori infection, HLD, nephrolithiasis, CVA, chronic tobacco abuse, with recent history of pneumonia was treated with antibiotics and steroids.  Presented overnight for nausea vomiting and diarrhea x 2 days.  Patient noted blood in her stool which is started tonight. Patient denies having any fever, acute on chronic pain 10 out of 10 all over her body.  Per patient recently was treated with oral antibiotics and p.o. steroids for presumed pneumonia--patient believes that this likely has exacerbated her nausea, vomiting diarrhea  ED course/evaluation Blood pressure (!) 123/52, pulse 66, temperature 97.6 F (36.4 C), RR 20,  SpO2 97%. CBC 27.6, hemoglobin 13,  neutrophil 22.1, monocytes 4.1, potassium 3.3, glucose 189, lactic acid 3.9 Lipase: 29   CT abdominal/pelvic without contrast -negative any other organ findings, pancreas within normal limits IMPRESSION: 1. New but likely subacute fracture of the anterior aspect of the superior endplate of L2 with 20% loss of anterior vertebral body height. 2. No other acute findings are noted in the abdomen or pelvis to account for the patient's symptoms on today's noncontrast CT examination. 3. Advanced emphysema in the visualized lung bases. 4. Aortic atherosclerosis.  EG-sinus rhythm within  normal limits  Endoscopy evidences of ulcer in 2019, colonoscopy 2018-normal colonoscopy 2015 normal with 2 polyps removed    Assessment & Plan:   Principal Problem:   GI bleed Active Problems:   Nausea & vomiting   Diarrhea   Leukocytosis   Dehydration   SIRS (systemic inflammatory response syndrome) (HCC)   COPD (chronic obstructive pulmonary disease) (HCC)   GERD   Abdominal pain   ANXIETY DEPRESSION   Chronic pancreatitis (HCC)   Hyperlipidemia   Hypokalemia   Hypomagnesemia   Hematochezia   GIB (gastrointestinal bleeding)     Assessment and Plan: * GI bleed -Patient still reporting rectal bleed this morning  -Still complaining of nausea, but no vomiting diarrhea has improved -1 bowel movement since admission, reporting diarrhea has improved Hemoglobin remained stable -Hemoglobin stable at 13 ->> 12.8 -Hemoglobin positive x 2 -Continue with Protonix 40 mg IV twice daily -GI started diet advancing slowly -GI consulted -recommending no intervention at this point   CT abdominal/pelvic without contrast -negative any other organ findings, pancreas within normal limits IMPRESSION: 1. New but likely subacute fracture of the anterior aspect of the superior endplate of L2 with 20% loss of anterior vertebral body height. 2. No other acute findings are noted in the abdomen or pelvis to account for the patient's symptoms on today's noncontrast CT examination. 3. Advanced emphysema in  the visualized lung bases. 4. Aortic atherosclerosis.  EG-sinus rhythm within normal limits  Endoscopy evidences of ulcer in 2019, colonoscopy 2018-normal colonoscopy 2015 normal with 2 polyps remove  SIRS (systemic inflammatory response syndrome) (HCC) - Patient meets SIRS criteria-with leukocytosis and lactic acidosis -Likely diet due to gastroenteritis  CBC 19.0 >>> 16.2 Lactic acid 3.9 >>1.9  Lipase: 29   CT abdominal/pelvic without contrast -negative any other organ findings,  pancreas within normal limits IMPRESSION: 1. New but likely subacute fracture of the anterior aspect of the superior endplate of L2 with 20% loss of anterior vertebral body height. 2. No other acute findings are noted in the abdomen or pelvis to account for the patient's symptoms on today's noncontrast CT examination. 3. Advanced emphysema in the visualized lung bases. 4. Aortic atherosclerosis.  EG-sinus rhythm within normal limits  -Blood cultures x 2-no growth to date -Will try to obtain stool cultures -Will continue  IV Unasyn -Will continue with gentle IV fluid hydration  Dehydration -Status post gentle IV fluid hydration, encouraging p.o. hydration  Leukocytosis - Continue leukocytosis, afebrile - Possible source of infection intra-abdominal -Monitoring WBC, lactic acid closely -Patient started on Unasyn will be continued for now  Diarrhea - Improved diarrhea, nursing staff reported 1 BM over past 24 hours -Possibly due to gastroenteritis, Hemoccult positive x 2 -Continue gentle hydration--encouraging p.o. hydration -Stool studies as needed  Nausea & vomiting - Possible infectious process enteritis-with GI bleed -Much improved -As needed Reglan -Status post IV fluid hydration -Advancing diet   Abdominal pain -Acute on chronic, improving, Improved nausea vomiting -Proved diarrhea -Will continue with as needed IV/p.o. analgesics  GERD - Initiating Protonix IV 40 mg twice daily -Switching to p.o.  COPD (chronic obstructive pulmonary disease) (HCC) - Currently stable, on 2 L of oxygen -Monitor closely continue as needed DuoNeb bronchodilator inhalers  Chronic pancreatitis (HCC) - CT abdomen pelvis regarding pancreatic contour within normal limits -Lipase level within normal limits -No signs of acute on chronic pancreatitis  ANXIETY DEPRESSION -As needed Ativan,  Home medication reviewed, Cymbalta, Lexapro, Atarax and home medication of Ativan reviewed Will be  resumed accordingly  GIB (gastrointestinal bleeding) - Continue monitoring appreciate GI follow-up  Hypomagnesemia - Repleting with 2 g IV  Hypokalemia -Monitoring - Repleting p.o./IV   Hyperlipidemia - Continue statin -      ------------------------------------------------------------------------------------------------------------------------------------------- Nutritional status:  The patient's BMI is: Body mass index is 12.6 kg/m. I agree with the assessment and plan as outlined -------------------------------------------------------------------------------------------------------------------------------------------  DVT prophylaxis:  TED hose Start: 07/11/23 0719 SCDs Start: 07/11/23 0719   Code Status:   Code Status: Full Code  Family Communication: No family member present at bedside- attempt will be made to update daily  -Advance care planning has been discussed.   Admission status:   Status is: Inpatient Remains inpatient appropriate because: Needing further evaluation for GI bleed,   Disposition: From  - home             Planning for discharge in 1-2 days: to   Procedures:   No admission procedures for hospital encounter.   Antimicrobials:  Anti-infectives (From admission, onward)    Start     Dose/Rate Route Frequency Ordered Stop   07/11/23 1800  ampicillin-sulbactam (UNASYN) 1.5 g in sodium chloride 0.9 % 100 mL IVPB  Status:  Discontinued        1.5 g 200 mL/hr over 30 Minutes Intravenous Every 12 hours 07/11/23 0717 07/11/23 0954   07/11/23 1800  Ampicillin-Sulbactam (UNASYN) 3  g in sodium chloride 0.9 % 100 mL IVPB        3 g 200 mL/hr over 30 Minutes Intravenous Every 12 hours 07/11/23 0954     07/11/23 0545  ampicillin-sulbactam (UNASYN) 1.5 g in sodium chloride 0.9 % 100 mL IVPB        1.5 g 200 mL/hr over 30 Minutes Intravenous  Once 07/11/23 0530 07/11/23 0617        Medication:   acidophilus  2 capsule Oral TID   DULoxetine   60 mg Oral BID   ezetimibe  10 mg Oral Daily   lipase/protease/amylase  12,000 Units Oral TID AC   megestrol  400 mg Oral Daily   pantoprazole  40 mg Oral BID   pravastatin  20 mg Oral q1800   sodium chloride flush  3 mL Intravenous Q12H    sodium chloride, acetaminophen **OR** acetaminophen, hydrALAZINE, HYDROmorphone (DILAUDID) injection, ipratropium, LORazepam, metoCLOPramide (REGLAN) injection, oxyCODONE-acetaminophen **AND** oxyCODONE, promethazine, sodium chloride flush, sodium phosphate, traZODone   Objective:   Vitals:   07/11/23 2058 07/11/23 2129 07/12/23 0437 07/12/23 0504  BP: 124/75  (!) 99/57   Pulse: 65  64   Resp: 19  17   Temp: 98.3 F (36.8 C) 98.5 F (36.9 C) 98.7 F (37.1 C)   TempSrc: Oral Oral Oral   SpO2: 97%  94%   Weight:    33.3 kg  Height:        Intake/Output Summary (Last 24 hours) at 07/12/2023 1243 Last data filed at 07/12/2023 0900 Gross per 24 hour  Intake 480 ml  Output 300 ml  Net 180 ml   Filed Weights   07/11/23 0345 07/12/23 0504  Weight: 33.5 kg 33.3 kg     Physical examination:   Constitution:  Alert, cooperative, no distress,  Appears calm and comfortable  Psychiatric:   Normal and stable mood and affect, cognition intact,   HEENT:        Normocephalic, PERRL, otherwise with in Normal limits  Chest:         Chest symmetric Cardio vascular:  S1/S2, RRR, No murmure, No Rubs or Gallops  pulmonary: Clear to auscultation bilaterally, respirations unlabored, negative wheezes / crackles Abdomen: Soft, non-tender, non-distended, bowel sounds,no masses, no organomegaly Muscular skeletal: Limited exam - in bed, able to move all 4 extremities,   Neuro: CNII-XII intact. , normal motor and sensation, reflexes intact  Extremities: No pitting edema lower extremities, +2 pulses  Skin: Dry, warm to touch, negative for any Rashes, No open wounds Wounds: per nursing  documentation   ------------------------------------------------------------------------------------------------------------------------------------------    LABs:     Latest Ref Rng & Units 07/12/2023    9:11 AM 07/12/2023    4:19 AM 07/11/2023    8:34 PM  CBC  WBC 4.0 - 10.5 K/uL 16.2  14.2  17.6   Hemoglobin 12.0 - 15.0 g/dL 16.1  09.6  04.5   Hematocrit 36.0 - 46.0 % 38.7  36.3  36.4   Platelets 150 - 400 K/uL 270  214  281       Latest Ref Rng & Units 07/12/2023    4:19 AM 07/11/2023    4:03 AM 06/25/2023    9:58 PM  CMP  Glucose 70 - 99 mg/dL 79  409  91   BUN 8 - 23 mg/dL 12  23  9    Creatinine 0.44 - 1.00 mg/dL 8.11  9.14  7.82   Sodium 135 - 145 mmol/L 137  137  137   Potassium 3.5 - 5.1 mmol/L 3.4  3.3  3.2   Chloride 98 - 111 mmol/L 104  100  100   CO2 22 - 32 mmol/L 26  23  28    Calcium 8.9 - 10.3 mg/dL 8.1  9.2  8.8   Total Protein 6.5 - 8.1 g/dL  6.9    Total Bilirubin 0.3 - 1.2 mg/dL  0.7    Alkaline Phos 38 - 126 U/L  81    AST 15 - 41 U/L  37    ALT 0 - 44 U/L  28         Micro Results Recent Results (from the past 240 hour(s))  Culture, blood (Routine X 2) w Reflex to ID Panel     Status: None (Preliminary result)   Collection Time: 07/11/23  8:41 AM   Specimen: BLOOD RIGHT ARM  Result Value Ref Range Status   Specimen Description BLOOD RIGHT ARM AEROBIC BOTTLE ONLY  Final   Special Requests   Final    Blood Culture results may not be optimal due to an excessive volume of blood received in culture bottles   Culture   Final    NO GROWTH < 24 HOURS Performed at [facility], [address], [place], [place] [address]    Report Status PENDING  Incomplete  Culture, blood (Routine X 2) w Reflex to ID Panel     Status: None (Preliminary result)   Collection Time: 07/11/23  8:41 AM   Specimen: BLOOD LEFT ARM  Result Value Ref Range Status   Specimen Description BLOOD LEFT ARM AEROBIC BOTTLE ONLY  Final   Special Requests   Final    Blood Culture  results may not be optimal due to an excessive volume of blood received in culture bottles   Culture   Final    NO GROWTH < 24 HOURS Performed at [facility], [address], [place], [place] [address]    Report Status PENDING  Incomplete    Radiology Reports No results found.  SIGNED: Kendell Bane, MD, FHM. FAAFP. [facility] - [facility] Time spent - 35 min.  In seeing, evaluating and examining the patient. Reviewing medical records, labs, drawn plan of care. [facility],  Pager (please use [email or web address] to page/ text) Please use Epic Secure Chat for non-urgent communication (7AM-7PM)  If 7PM-7AM, please contact night-coverage [email or web address], 07/12/2023, 12:43 PM

## 2023-07-12 NOTE — Progress Notes (Signed)
Gastroenterology Progress Note    Patient ID: EDITA WEYENBERG; [id number]; 1946-05-09    Subjective   Bright red blood a few hours ago. 2-3 episodes of bleeding overnight. Pain improved. No more N/V. No diarrhea. No diet. Would like to try clear liquids.    Objective   Vital signs in last 24 hours Temp:  [98.3 F (36.8 C)-98.8 F (37.1 C)] 98.7 F (37.1 C) (10/14 0437) Pulse Rate:  [64-70] 64 (10/14 0437) Resp:  [17-20] 17 (10/14 0437) BP: (96-124)/(57-75) 99/57 (10/14 0437) SpO2:  [94 %-98 %] 94 % (10/14 0437) Weight:  [33.3 kg] 33.3 kg (10/14 0504) Last BM Date : 07/11/23  Physical Exam General:   Alert and oriented, pleasant, thin, frail-appearing Head:  Normocephalic and atraumatic. Abdomen:  Bowel sounds present, soft, TTP right-sided abdomen (baseline for patient's chronic pain) Extremities:  Without edema. Neurologic:  Alert and  oriented x4 Psych:  Alert and cooperative. Normal mood and affect.  Intake/Output from previous day: 10/13 0701 - 10/14 0700 In: 360 [P.O.:360] Out: 300 [Urine:300] Intake/Output this shift: Total I/O In: 120 [P.O.:120] Out: -   Lab Results  Recent Labs    07/11/23 2034 07/12/23 0419 07/12/23 0911  WBC 17.6* 14.2* 16.2*  HGB 12.6 12.4 12.8  HCT 36.4 36.3 38.7  PLT 281 214 270   BMET Recent Labs    07/11/23 0403 07/12/23 0419  NA 137 137  K 3.3* 3.4*  CL 100 104  CO2 23 26  GLUCOSE 189* 79  BUN 23 12  CREATININE 0.63 0.41*  CALCIUM 9.2 8.1*   LFT Recent Labs    07/11/23 0403  PROT 6.9  ALBUMIN 4.2  AST 37  ALT 28  ALKPHOS 81  BILITOT 0.7   PT/INR Recent Labs    07/11/23 0403 07/12/23 0419  LABPROT 13.4 14.4  INR 1.0 1.1   Studies/Results CT ABDOMEN PELVIS WO CONTRAST  Result Date: 07/11/2023 CLINICAL DATA:  77 year old female with history of nausea, vomiting and diarrhea for 2 days with blood in stool since yesterday evening. EXAM: CT ABDOMEN AND PELVIS WITHOUT CONTRAST TECHNIQUE:  Multidetector CT imaging of the abdomen and pelvis was performed following the standard protocol without IV contrast. RADIATION DOSE REDUCTION: This exam was performed according to the departmental dose-optimization program which includes automated exposure control, adjustment of the mA and/or kV according to patient size and/or use of iterative reconstruction technique. COMPARISON:  CT of the abdomen and pelvis 06/21/2022. FINDINGS: Lower chest: Advanced emphysematous changes are noted throughout the visualize lung bases. Atherosclerotic calcifications in the thoracic aorta. Hepatobiliary: No definite suspicious cystic or solid hepatic lesions are confidently identified on today's noncontrast CT examination. Unenhanced appearance of the gallbladder is unremarkable. Pancreas: No definite pancreatic mass or peripancreatic fluid collections or inflammatory changes are noted on today's noncontrast CT examination. Spleen: Unremarkable. Adrenals/Urinary Tract: Unenhanced appearance of the kidneys and bilateral adrenal glands is normal. No hydroureteronephrosis. Urinary bladder is are unremarkable in appearance. Stomach/Bowel: Unenhanced appearance of the stomach is unremarkable. No pathologic dilatation of small bowel or colon. The appendix is not confidently identified and may be surgically absent. Regardless, there are no inflammatory changes noted adjacent to the cecum to suggest the presence of an acute appendicitis at this time. Vascular/Lymphatic: Extensive atherosclerosis throughout the abdominal aorta and pelvic vasculature. No lymphadenopathy noted in the abdomen or pelvis. Reproductive: Status post hysterectomy. Ovaries are not confidently identified may be surgically absent or atrophic. Other: No significant volume of ascites.  No pneumoperitoneum. Musculoskeletal:  Compression fracture of superior endplate of L2, new compared to the prior study, with approximately 20% loss of anterior vertebral body height.  Fracture line is still visible, although there appears to be some bony resorption associated with the fracture, suggesting a subacute injury. No surrounding soft tissue swelling. There are no aggressive appearing lytic or blastic lesions noted in the visualized portions of the skeleton. IMPRESSION: 1. New but likely subacute fracture of the anterior aspect of the superior endplate of L2 with 20% loss of anterior vertebral body height. 2. No other acute findings are noted in the abdomen or pelvis to account for the patient's symptoms on today's noncontrast CT examination. 3. Advanced emphysema in the visualized lung bases. 4. Aortic atherosclerosis. 5. Additional incidental findings, as above. Electronically Signed   By: Trudie Reed M.D.   On: 07/11/2023 05:16   DG Chest 2 View  Result Date: 07/03/2023 CLINICAL DATA:  Right chest pain, cough EXAM: CHEST - 2 VIEW COMPARISON:  06/25/2023 FINDINGS: The lungs are markedly hyperinflated and there is coarsening of the interstitial pattern in keeping with changes of advanced emphysema. There is developing focal infiltrate within the superior segment of the right lower lobe, possibly infectious in the acute setting. No pneumothorax or pleural effusion. Cardiac size within normal limits. Pulmonary vascularity is normal. No acute bone abnormality. IMPRESSION: 1. Advanced emphysema. 2. Developing focal infiltrate within the superior segment of the right lower lobe, possibly infectious in the acute setting. Follow-up chest radiograph is recommended in 3-4 weeks to document resolution. Electronically Signed   By: Helyn Numbers M.D.   On: 07/03/2023 17:39   DG Chest 2 View  Result Date: 06/25/2023 CLINICAL DATA:  Chest pain EXAM: CHEST - 2 VIEW COMPARISON:  06/24/2022 FINDINGS: Severe emphysema. Heart and mediastinal contours are within normal limits. No focal opacities or effusions. No acute bony abnormality. IMPRESSION: Severe emphysema. No active cardiopulmonary  disease. Electronically Signed   By: Charlett Nose M.D.   On: 06/25/2023 22:32    Assessment  77 y.o. female with a history of chronic pancreatitis, failure to thrive, OIC, advanced COPD, admitted currently with SIRS and acute symptoms of N/V, diarrhea, and rectal bleeding.   She has chronic abdominal pain that is now at her baseline, and she has had no diarrhea today but continues with low-volume hematochezia. Hgb remaining stable/normal and does not appear to be significant GI bleeding. Stool studies not collected as diarrhea resolved.   Will advance diet to clear liquids. She is actually due for colonoscopy. If continues with stuttering bleeding, can consider inpatient colonoscopy.     Plan / Recommendations  Clear liquids Reassess in am. If remains stable, could pursue outpatient colonoscopy.  Can pursue inpatient colonoscopy if persistent or worsening bleeding, anemia, etc    LOS: 0 days    07/12/2023, 11:25 AM  Gelene Mink, PhD, ANP-BC [facility] Gastroenterology

## 2023-07-13 DIAGNOSIS — R109 Unspecified abdominal pain: Secondary | ICD-10-CM | POA: Diagnosis not present

## 2023-07-13 DIAGNOSIS — R6510 Systemic inflammatory response syndrome (SIRS) of non-infectious origin without acute organ dysfunction: Secondary | ICD-10-CM | POA: Diagnosis not present

## 2023-07-13 DIAGNOSIS — K625 Hemorrhage of anus and rectum: Secondary | ICD-10-CM | POA: Diagnosis not present

## 2023-07-13 LAB — BASIC METABOLIC PANEL
Anion gap: 9 (ref 5–15)
BUN: 6 mg/dL — ABNORMAL LOW (ref 8–23)
CO2: 28 mmol/L (ref 22–32)
Calcium: 8.1 mg/dL — ABNORMAL LOW (ref 8.9–10.3)
Chloride: 98 mmol/L (ref 98–111)
Creatinine, Ser: 0.47 mg/dL (ref 0.44–1.00)
GFR, Estimated: >60 mL/min (ref >60)
Glucose, Bld: 94 mg/dL (ref 70–99)
Potassium: 2.7 mmol/L — CL (ref 3.5–5.1)
Sodium: 135 mmol/L (ref 135–145)

## 2023-07-13 LAB — CBC
HCT: 35.8 % — ABNORMAL LOW (ref 36.0–46.0)
Hemoglobin: 12.2 g/dL (ref 12.0–15.0)
MCH: 31.4 pg (ref 26.0–34.0)
MCHC: 34.1 g/dL (ref 30.0–36.0)
MCV: 92.3 fL (ref 80.0–100.0)
Platelets: 303 10*3/uL (ref 150–400)
RBC: 3.88 MIL/uL (ref 3.87–5.11)
RDW: 14 % (ref 11.5–15.5)
WBC: 9.5 10*3/uL (ref 4.0–10.5)
nRBC: 0 % (ref 0.0–0.2)

## 2023-07-13 LAB — GLUCOSE, CAPILLARY: Glucose-Capillary: 96 mg/dL (ref 70–99)

## 2023-07-13 MED ORDER — POTASSIUM CHLORIDE 10 MEQ/100ML IV SOLN
10.0000 meq | INTRAVENOUS | Status: DC
  Administered 2023-07-13 (×4): 10 meq via INTRAVENOUS
  Filled 2023-07-13 (×4): qty 100

## 2023-07-13 MED ORDER — PANTOPRAZOLE SODIUM 40 MG PO TBEC
40.0000 mg | DELAYED_RELEASE_TABLET | Freq: Two times a day (BID) | ORAL | Status: DC
  Administered 2023-07-14: 40 mg via ORAL
  Filled 2023-07-13: qty 1

## 2023-07-13 MED ORDER — SODIUM CHLORIDE 0.9 % IV SOLN
3.0000 g | Freq: Three times a day (TID) | INTRAVENOUS | Status: DC
  Administered 2023-07-13 – 2023-07-14 (×3): 3 g via INTRAVENOUS
  Filled 2023-07-13 (×3): qty 8

## 2023-07-13 MED ORDER — POTASSIUM CHLORIDE CRYS ER 20 MEQ PO TBCR
20.0000 meq | EXTENDED_RELEASE_TABLET | Freq: Once | ORAL | Status: AC
  Administered 2023-07-13: 20 meq via ORAL
  Filled 2023-07-13: qty 1

## 2023-07-13 NOTE — Progress Notes (Signed)
OT Cancellation Note  Patient Details Name: [name] MRN: [id number] DOB: 1946/04/30   Cancelled Treatment:    Reason Eval/Treat Not Completed: OT screened, no needs identified, will sign off. Pt worked with physical therapy who reported pt ambulated in the room independently and stated that the family can take care of her. Pt will be removed from OT list.   Danie Chandler OT, MOT   Danie Chandler 07/13/2023, 12:23 PM

## 2023-07-13 NOTE — Progress Notes (Signed)
PT Cancellation Note  Patient Details Name: [name] MRN: [id number] DOB: 03/17/1946   Cancelled Treatment:    Reason Eval/Treat Not Completed: PT screened, no needs identified, will sign off.  Patient ambulating independently in room and states her family will be able to take care of her and declines physical therapy follow up at home.  Patient encouraged to ambulate with nursing staff and mobility tech for length of stay.   12:07 PM, 07/13/23 Ocie Bob, MPT Physical Therapist with [facility] [phone] office [phone] mobile phone

## 2023-07-13 NOTE — Progress Notes (Signed)
Lab stated pt has CRITICAL POTASSIUM results:  2.7. Informed Dr. Julieta Bellini @t  [phone]

## 2023-07-13 NOTE — Progress Notes (Signed)
Ate about 1/3 of soft diet for supper. Denies any increased pain, denies nausea.

## 2023-07-13 NOTE — Progress Notes (Signed)
Mobility Specialist Progress Note:    07/13/23 1410  Mobility  Activity Ambulated with assistance in hallway  Level of Assistance Contact guard assist, steadying assist  Assistive Device None  Distance Ambulated (ft) 140 ft  Range of Motion/Exercises Active;All extremities  Activity Response Tolerated well  Mobility Referral Yes  $Mobility charge 1 Mobility  Mobility Specialist Start Time (ACUTE ONLY) 1410  Mobility Specialist Stop Time (ACUTE ONLY) 1420  Mobility Specialist Time Calculation (min) (ACUTE ONLY) 10 min   Pt received in bed, NT in room. Agreeable to mobility, required CGA to stand and ambulate with no AD. Tolerated well, c/o weakness and fatigue. Returned pt to room, left supine. All needs met.   Lawerance Bach Mobility Specialist Please contact via Special educational needs teacher or  Rehab office at [phone]

## 2023-07-13 NOTE — Progress Notes (Addendum)
Subjective: Passing a little mucous with a little blood tinge in it. Reports she continued with intermittent rectal bleeding up until 2 am this morning. Reports she would pass white mucous and red blood only. No passage of stool since admission. She is passing gas.   Abdominal pain has improved. Now back to her baseline.   No nausea or vomiting today.   Doesn't like clear liquids, so not eating/drinking much.   Objective: Vital signs in last 24 hours: Temp:  [97.6 F (36.4 C)-98.8 F (37.1 C)] 98.1 F (36.7 C) (10/15 0458) Pulse Rate:  [62-69] 64 (10/15 0458) Resp:  [17-18] 17 (10/15 0458) BP: (103-129)/(55-82) 122/73 (10/15 0458) SpO2:  [92 %-95 %] 95 % (10/15 0458) Weight:  [34 kg] 34 kg (10/15 0458) Last BM Date : 07/12/23 General:   Alert and oriented, pleasant, NAD.  Head:  Normocephalic and atraumatic.  Abdomen:  Bowel sounds present, soft, non-distended. Mild generalized TTP. No rebound or guarding.  Msk:  Symmetrical without gross deformities. Normal posture. Extremities:  Without edema. Neurologic:  Alert and  oriented x4;  grossly normal neurologically. Psych: Normal mood and affect.  Intake/Output from previous day: 10/14 0701 - 10/15 0700 In: 360 [P.O.:360] Out: 200 [Urine:200] Intake/Output this shift: Total I/O In: 300 [P.O.:300] Out: -   Lab Results: Recent Labs    07/11/23 2034 07/12/23 0419 07/12/23 0911  WBC 17.6* 14.2* 16.2*  HGB 12.6 12.4 12.8  HCT 36.4 36.3 38.7  PLT 281 214 270   BMET Recent Labs    07/11/23 0403 07/12/23 0419 07/13/23 0455  NA 137 137 135  K 3.3* 3.4* 2.7*  CL 100 104 98  CO2 23 26 28   GLUCOSE 189* 79 94  BUN 23 12 6*  CREATININE 0.63 0.41* 0.47  CALCIUM 9.2 8.1* 8.1*   LFT Recent Labs    07/11/23 0403  PROT 6.9  ALBUMIN 4.2  AST 37  ALT 28  ALKPHOS 81  BILITOT 0.7   PT/INR Recent Labs    07/11/23 0403 07/12/23 0419  LABPROT 13.4 14.4  INR 1.0 1.1    Assessment: 77 y.o. female with a  history of chronic pancreatitis, failure to thrive, OIC, advanced COPD, admitted currently with SIRS and acute symptoms of N/V, diarrhea, and rectal bleeding.   She had improvement in abdominal pain back to baseline and resolution of diarrhea by the time of admission, but continued with intermittent rectal bleeding. Reports this was slowly decreasing in amount yesterday. This morning passed a small amount of white mucous with slight tinge of blood. Stool studies not completed as diarrhea resolved.  Encouragingly, Hgb has remained within normal limits.   Etiology of rectal bleeding is not clear. No acute findings on CT this admission. Considering elevated lactic acid and rectal bleeding developing after acute onset abdominal pain, ischemic injury remains in the differential.  Discussed possibility of a colonoscopy.  Patient is on the fence about this.  Notably, she is overdue for surveillance colonoscopy history of colon polyps.  Considering rectal bleeding seems to have tapered off and hemoglobin remained within normal limits, could consider outpatient colonoscopy.    Plan: Continue to monitor for overt GI bleeding.  Continue to trend H/H As long as rectal bleeding has resolved and Hgb remains stable, can consider outpatient colonoscopy.  Consider advancing diet this evening as long as no recurrent rectal bleeding.    LOS: 1 day    07/13/2023, 11:37 AM   Ermalinda Memos, PA-C [facility]

## 2023-07-13 NOTE — Progress Notes (Signed)
Did not drink much of clear liquid trays and asked for food.  Since no bms and just slight blood tinged mucus from rectum diet was advanced to soft diet. Fourth potassium run infusing , continuing at reduced rate due to irritation.  Has been ambulating with standby to bathroom and ambulated in hallway with mobility tech.

## 2023-07-13 NOTE — Plan of Care (Signed)
Pt continues to experience right side abdomen pain. Pain relieved by prescribed meds. Pt has yet to have a bowel movement. However, pt has not had any bloody stools or urine. Urine has been yellow and clear. 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

## 2023-07-13 NOTE — Progress Notes (Signed)
PROGRESS NOTE    Patient: [name]                            PCP: Benita Stabile, MD                    DOB: August 20, 1946            DOA: 07/11/2023 ZOX:[id number]             DOS: 07/13/2023, 11:49 AM   LOS: 1 day   Date of Service: The patient was seen and examined on 07/13/2023  Subjective:   The patient was seen and examined this morning stable no acute distress reporting 1 episode of rectal bleed around 2 AM. Complaining of generalized weaknesses denies any abdominal pain shortness of breath or chest pain  Brief Narrative:   [name] is a 77 yo female with extensive history of chronic pancreatitis, anxiety, depression, chronic back pain, CAD, cervical cancer, COPD, GERD, previous H. pylori infection, HLD, nephrolithiasis, CVA, chronic tobacco abuse, with recent history of pneumonia was treated with antibiotics and steroids.  Presented overnight for nausea vomiting and diarrhea x 2 days.  Patient noted blood in her stool which is started tonight. Patient denies having any fever, acute on chronic pain 10 out of 10 all over her body.  Per patient recently was treated with oral antibiotics and p.o. steroids for presumed pneumonia--patient believes that this likely has exacerbated her nausea, vomiting diarrhea  ED course/evaluation Blood pressure (!) 123/52, pulse 66, temperature 97.6 F (36.4 C), RR 20,  SpO2 97%. CBC 27.6, hemoglobin 13,  neutrophil 22.1, monocytes 4.1, potassium 3.3, glucose 189, lactic acid 3.9 Lipase: 29   CT abdominal/pelvic without contrast -negative any other organ findings, pancreas within normal limits IMPRESSION: 1. New but likely subacute fracture of the anterior aspect of the superior endplate of L2 with 20% loss of anterior vertebral body height. 2. No other acute findings are noted in the abdomen or pelvis to account for the patient's symptoms on today's noncontrast CT examination. 3. Advanced emphysema in the visualized lung bases. 4.  Aortic atherosclerosis.  EG-sinus rhythm within normal limits  Endoscopy evidences of ulcer in 2019, colonoscopy 2018-normal colonoscopy 2015 normal with 2 polyps removed    Assessment & Plan:   Principal Problem:   GI bleed Active Problems:   Nausea & vomiting   Diarrhea   Leukocytosis   Dehydration   SIRS (systemic inflammatory response syndrome) (HCC)   COPD (chronic obstructive pulmonary disease) (HCC)   GERD   Abdominal pain   ANXIETY DEPRESSION   Chronic pancreatitis (HCC)   Hyperlipidemia   Hypokalemia   Hypomagnesemia   Hematochezia   GIB (gastrointestinal bleeding)     Assessment and Plan: * GI bleed -Still reporting rectal bleed -H&H remained stable, GI following, recommending possible inpatient colonoscopy in a.m. 07/14/23  -Still complaining of nausea, but no vomiting diarrhea has improved -1 bowel movement since admission, reporting diarrhea has improved -Hemoglobin stable at 13 ->> 12.8 -Hemoglobin positive x 2 -Continue with Protonix 40 mg IV twice daily -GI started diet advancing slowly -GI consulted -recommending no intervention at this point   CT abdominal/pelvic without contrast -negative any other organ findings, pancreas within normal limits IMPRESSION: 1. New but likely subacute fracture of the anterior aspect of the superior endplate of L2 with 20% loss of anterior vertebral body height. 2. No other acute findings are noted  in the abdomen or pelvis to account for the patient's symptoms on today's noncontrast CT examination. 3. Advanced emphysema in the visualized lung bases. 4. Aortic atherosclerosis.  EG-sinus rhythm within normal limits  Endoscopy evidences of ulcer in 2019, colonoscopy 2018-normal colonoscopy 2015 normal with 2 polyps remove  SIRS (systemic inflammatory response syndrome) (HCC) -Hemodynamically stable, - Patient met SIRS criteria-with leukocytosis and lactic acidosis -Likely diet due to gastroenteritis  CBC 19.0 >>>  16.2 >>>  Lactic acid 3.9 >>1.9  Lipase: 29   CT abdominal/pelvic without contrast -negative any other organ findings, pancreas within normal limits IMPRESSION: 1. New but likely subacute fracture of the anterior aspect of the superior endplate of L2 with 20% loss of anterior vertebral body height. 2. No other acute findings are noted in the abdomen or pelvis to account for the patient's symptoms on today's noncontrast CT examination. 3. Advanced emphysema in the visualized lung bases. 4. Aortic atherosclerosis.  EG-sinus rhythm within normal limits  -Blood cultures x 2-no growth to date -Will continue  IV Unasyn -Will continue with gentle IV fluid hydration  Dehydration -S/P gentle IV fluid hydration, encouraging p.o. hydration  Leukocytosis - Continue leukocytosis, afebrile - Possible source of infection intra-abdominal -Monitoring WBC, lactic acid closely -Patient started on Unasyn will be continued for now  Diarrhea - Improved diarrhea, -C. difficile negative -1 bowel movement since admission -Possibly due to gastroenteritis, Hemoccult positive x 2 -Continue gentle hydration--encouraging p.o. hydration   Nausea & vomiting - Possible infectious process enteritis-with GI bleed -Much improved -As needed Reglan -Status post IV fluid hydration -Advancing diet   Abdominal pain -Improved abdominal pain Improved nausea vomiting -Improved diarrhea -Will continue with as needed IV/p.o. analgesics  GERD - Initiating Protonix IV 40 mg twice daily -Switching to p.o.  COPD (chronic obstructive pulmonary disease) (HCC) - Currently stable, on 2 L of oxygen -Monitor closely continue as needed DuoNeb bronchodilator inhalers  Chronic pancreatitis (HCC) - CT abdomen pelvis regarding pancreatic contour within normal limits -Lipase level within normal limits -No signs of acute on chronic pancreatitis  ANXIETY DEPRESSION -As needed Ativan,  Home medication reviewed, Cymbalta,  Lexapro, Atarax and home medication of Ativan reviewed Will be resumed accordingly  GIB (gastrointestinal bleeding) - Continue monitoring appreciate GI follow-up -Possible colonoscopy in a.m. per GI  Hypomagnesemia - Repleting with 2 g IV -Monitoring   Hypokalemia -Monitoring -Serum potassium 2.7 - Repleting p.o./IV   Hyperlipidemia - Continue statin -      ------------------------------------------------------------------------------------------------------------------------------------------- Nutritional status:  The patient's BMI is: Body mass index is 12.87 kg/m. I agree with the assessment and plan as outlined -------------------------------------------------------------------------------------------------------------------------------------------  DVT prophylaxis:  TED hose Start: 07/11/23 0719 SCDs Start: 07/11/23 0719   Code Status:   Code Status: Full Code  Family Communication: No family member present at bedside- attempt will be made to update daily  -Advance care planning has been discussed.   Admission status:   Status is: Inpatient Remains inpatient appropriate because: Needing further evaluation for GI bleed,   Disposition: From  - home             Planning for discharge in 1-2 days: to   Procedures:   No admission procedures for hospital encounter.   Antimicrobials:  Anti-infectives (From admission, onward)    Start     Dose/Rate Route Frequency Ordered Stop   07/11/23 1800  ampicillin-sulbactam (UNASYN) 1.5 g in sodium chloride 0.9 % 100 mL IVPB  Status:  Discontinued  1.5 g 200 mL/hr over 30 Minutes Intravenous Every 12 hours 07/11/23 0717 07/11/23 0954   07/11/23 1800  Ampicillin-Sulbactam (UNASYN) 3 g in sodium chloride 0.9 % 100 mL IVPB        3 g 200 mL/hr over 30 Minutes Intravenous Every 12 hours 07/11/23 0954     07/11/23 0545  ampicillin-sulbactam (UNASYN) 1.5 g in sodium chloride 0.9 % 100 mL IVPB        1.5 g 200  mL/hr over 30 Minutes Intravenous  Once 07/11/23 0530 07/11/23 0617        Medication:   acidophilus  2 capsule Oral TID   DULoxetine  60 mg Oral BID   ezetimibe  10 mg Oral Daily   lipase/protease/amylase  12,000 Units Oral TID AC   megestrol  400 mg Oral Daily   [START ON 07/14/2023] pantoprazole  40 mg Oral BID AC   pravastatin  20 mg Oral q1800   sodium chloride flush  3 mL Intravenous Q12H    sodium chloride, acetaminophen **OR** acetaminophen, hydrALAZINE, HYDROmorphone (DILAUDID) injection, ipratropium, LORazepam, metoCLOPramide (REGLAN) injection, oxyCODONE-acetaminophen **AND** oxyCODONE, promethazine, sodium chloride flush, sodium phosphate, traZODone   Objective:   Vitals:   07/12/23 0504 07/12/23 1309 07/12/23 2126 07/13/23 0458  BP:  129/82 (!) 103/55 122/73  Pulse:  62 69 64  Resp:  17 18 17   Temp:  97.6 F (36.4 C) 98.8 F (37.1 C) 98.1 F (36.7 C)  TempSrc:  Oral Oral Oral  SpO2:  92% 94% 95%  Weight: 33.3 kg   34 kg  Height:        Intake/Output Summary (Last 24 hours) at 07/13/2023 1149 Last data filed at 07/13/2023 0900 Gross per 24 hour  Intake 540 ml  Output 200 ml  Net 340 ml   Filed Weights   07/11/23 0345 07/12/23 0504 07/13/23 0458  Weight: 33.5 kg 33.3 kg 34 kg     Physical examination:   General:  AAO x 3,  cooperative, no distress;   HEENT:  Normocephalic, PERRL, otherwise with in Normal limits   Neuro:  CNII-XII intact. , normal motor and sensation, reflexes intact   Lungs:   Clear to auscultation BL, Respirations unlabored,  No wheezes / crackles  Cardio:    S1/S2, RRR, No murmure, No Rubs or Gallops   Abdomen:  Soft, non-tender, bowel sounds active all four quadrants, no guarding or peritoneal signs.  Muscular  skeletal:  Limited exam -global generalized weaknesses - in bed, able to move all 4 extremities,   2+ pulses,  symmetric, No pitting edema  Skin:  Dry, warm to touch, negative for any Rashes,  Wounds: Please see  nursing documentation          ------------------------------------------------------------------------------------------------------------------------------------------    LABs:     Latest Ref Rng & Units 07/12/2023    9:11 AM 07/12/2023    4:19 AM 07/11/2023    8:34 PM  CBC  WBC 4.0 - 10.5 K/uL 16.2  14.2  17.6   Hemoglobin 12.0 - 15.0 g/dL 45.4  09.8  11.9   Hematocrit 36.0 - 46.0 % 38.7  36.3  36.4   Platelets 150 - 400 K/uL 270  214  281       Latest Ref Rng & Units 07/13/2023    4:55 AM 07/12/2023    4:19 AM 07/11/2023    4:03 AM  CMP  Glucose 70 - 99 mg/dL 94  79  147   BUN 8 - 23 mg/dL  6  12  23    Creatinine 0.44 - 1.00 mg/dL 1.61  0.96  0.45   Sodium 135 - 145 mmol/L 135  137  137   Potassium 3.5 - 5.1 mmol/L 2.7  3.4  3.3   Chloride 98 - 111 mmol/L 98  104  100   CO2 22 - 32 mmol/L 28  26  23    Calcium 8.9 - 10.3 mg/dL 8.1  8.1  9.2   Total Protein 6.5 - 8.1 g/dL   6.9   Total Bilirubin 0.3 - 1.2 mg/dL   0.7   Alkaline Phos 38 - 126 U/L   81   AST 15 - 41 U/L   37   ALT 0 - 44 U/L   28        Micro Results Recent Results (from the past 240 hour(s))  Culture, blood (Routine X 2) w Reflex to ID Panel     Status: None (Preliminary result)   Collection Time: 07/11/23  8:41 AM   Specimen: BLOOD RIGHT ARM  Result Value Ref Range Status   Specimen Description BLOOD RIGHT ARM AEROBIC BOTTLE ONLY  Final   Special Requests   Final    Blood Culture results may not be optimal due to an excessive volume of blood received in culture bottles   Culture   Final    NO GROWTH 2 DAYS Performed at [facility], [address], [place], [place] [address]    Report Status PENDING  Incomplete  Culture, blood (Routine X 2) w Reflex to ID Panel     Status: None (Preliminary result)   Collection Time: 07/11/23  8:41 AM   Specimen: BLOOD LEFT ARM  Result Value Ref Range Status   Specimen Description BLOOD LEFT ARM AEROBIC BOTTLE ONLY  Final   Special Requests   Final     Blood Culture results may not be optimal due to an excessive volume of blood received in culture bottles   Culture   Final    NO GROWTH 2 DAYS Performed at [facility], [address] [place], [place] [address]    Report Status PENDING  Incomplete    Radiology Reports No results found.  SIGNED: Kendell Bane, MD, FHM. FAAFP. [facility] - [facility] Time spent - 35 min.  In seeing, evaluating and examining the patient. Reviewing medical records, labs, drawn plan of care. [facility],  Pager (please use [email or web address] to page/ text) Please use Epic Secure Chat for non-urgent communication (7AM-7PM)  If 7PM-7AM, please contact night-coverage [email or web address], 07/13/2023, 11:49 AM

## 2023-07-14 ENCOUNTER — Telehealth (INDEPENDENT_AMBULATORY_CARE_PROVIDER_SITE_OTHER): Payer: Self-pay | Admitting: Gastroenterology

## 2023-07-14 DIAGNOSIS — K625 Hemorrhage of anus and rectum: Secondary | ICD-10-CM | POA: Diagnosis not present

## 2023-07-14 DIAGNOSIS — K5791 Diverticulosis of intestine, part unspecified, without perforation or abscess with bleeding: Secondary | ICD-10-CM | POA: Diagnosis not present

## 2023-07-14 DIAGNOSIS — E876 Hypokalemia: Secondary | ICD-10-CM

## 2023-07-14 DIAGNOSIS — K861 Other chronic pancreatitis: Secondary | ICD-10-CM

## 2023-07-14 LAB — GLUCOSE, CAPILLARY: Glucose-Capillary: 80 mg/dL (ref 70–99)

## 2023-07-14 LAB — CBC
HCT: 37.4 % (ref 36.0–46.0)
Hemoglobin: 12.3 g/dL (ref 12.0–15.0)
MCH: 31 pg (ref 26.0–34.0)
MCHC: 32.9 g/dL (ref 30.0–36.0)
MCV: 94.2 fL (ref 80.0–100.0)
Platelets: 331 10*3/uL (ref 150–400)
RBC: 3.97 MIL/uL (ref 3.87–5.11)
RDW: 14 % (ref 11.5–15.5)
WBC: 13.3 10*3/uL — ABNORMAL HIGH (ref 4.0–10.5)
nRBC: 0 % (ref 0.0–0.2)

## 2023-07-14 LAB — BASIC METABOLIC PANEL
Anion gap: 10 (ref 5–15)
BUN: <5 mg/dL — ABNORMAL LOW (ref 8–23)
CO2: 28 mmol/L (ref 22–32)
Calcium: 8.4 mg/dL — ABNORMAL LOW (ref 8.9–10.3)
Chloride: 100 mmol/L (ref 98–111)
Creatinine, Ser: 0.51 mg/dL (ref 0.44–1.00)
GFR, Estimated: >60 mL/min (ref >60)
Glucose, Bld: 93 mg/dL (ref 70–99)
Potassium: 3.2 mmol/L — ABNORMAL LOW (ref 3.5–5.1)
Sodium: 138 mmol/L (ref 135–145)

## 2023-07-14 MED ORDER — POTASSIUM CHLORIDE CRYS ER 20 MEQ PO TBCR
40.0000 meq | EXTENDED_RELEASE_TABLET | Freq: Once | ORAL | Status: AC
  Administered 2023-07-14: 40 meq via ORAL
  Filled 2023-07-14: qty 2

## 2023-07-14 NOTE — Plan of Care (Signed)
  Problem: Safety: Goal: Ability to remain free from injury will improve Outcome: Not Progressing   Problem: Pain Managment: Goal: General experience of comfort will improve Outcome: Not Progressing   Problem: Elimination: Goal: Will not experience complications related to bowel motility Outcome: Not Progressing

## 2023-07-14 NOTE — Plan of Care (Signed)
Problem: Education: Goal: Knowledge of General Education information will improve Description: Including pain rating scale, medication(s)/side effects and non-pharmacologic comfort measures Outcome: Adequate for Discharge   Problem: Health Behavior/Discharge Planning: Goal: Ability to manage health-related needs will improve Outcome: Adequate for Discharge   Problem: Clinical Measurements: Goal: Ability to maintain clinical measurements within normal limits will improve Outcome: Adequate for Discharge Goal: Will remain free from infection Outcome: Adequate for Discharge Goal: Diagnostic test results will improve Outcome: Adequate for Discharge Goal: Respiratory complications will improve Outcome: Adequate for Discharge   Problem: Activity: Goal: Risk for activity intolerance will decrease Outcome: Adequate for Discharge   Problem: Nutrition: Goal: Adequate nutrition will be maintained Outcome: Adequate for Discharge   Problem: Coping: Goal: Level of anxiety will decrease Outcome: Adequate for Discharge   Problem: Elimination: Goal: Will not experience complications related to bowel motility Outcome: Adequate for Discharge Goal: Will not experience complications related to urinary retention Outcome: Adequate for Discharge   Problem: Pain Managment: Goal: General experience of comfort will improve Outcome: Adequate for Discharge   Problem: Safety: Goal: Ability to remain free from injury will improve Outcome: Adequate for Discharge   Problem: Skin Integrity: Goal: Risk for impaired skin integrity will decrease Outcome: Adequate for Discharge

## 2023-07-14 NOTE — Discharge Summary (Signed)
Physician Discharge Summary   Patient: [name] MRN: [id number] DOB: 1946-01-21  Admit date:     07/11/2023  Discharge date: 07/14/23  Discharge Physician: Onalee Hua Mayvis Agudelo   PCP: Benita Stabile, MD   Recommendations at discharge:   Please follow up with primary care provider within 1-2 weeks  Please repeat BMP and CBC in one week    Hospital Course: [name] is a 77 yo female with extensive history of chronic pancreatitis, anxiety, depression, chronic back pain, CAD, cervical cancer, COPD, GERD, previous H. pylori infection, HLD, nephrolithiasis, CVA, tobacco abuse, with recent history of pneumonia was treated with antibiotics and steroids.  Presented overnight for nausea vomiting and diarrhea x 2 days.  Patient noted blood in her stool which is started the evening prior to admission. Patient denies having any fever, acute on chronic pain 10 out of 10 all over her body.  Per patient recently was treated with oral antibiotics and p.o. steroids for presumed pneumonia--patient believes that this likely has exacerbated her nausea, vomiting diarrhea Due to her hematochezia and diarrhea, GI was consulted to assist.  ED course/evaluation Blood pressure (!) 123/52, pulse 66, temperature 97.6 F (36.4 C), RR 20,  SpO2 97%. CBC 27.6, hemoglobin 13,  neutrophil 22.1, monocytes 4.1, potassium 3.3, glucose 189, lactic acid 3.9 Lipase: 29   CT abdominal/pelvic without contrast -negative any other organ findings, pancreas within normal limits IMPRESSION: 1. New but likely subacute fracture of the anterior aspect of the superior endplate of L2 with 20% loss of anterior vertebral body height. 2. No other acute findings are noted in the abdomen or pelvis to account for the patient's symptoms on today's noncontrast CT examination. 3. Advanced emphysema in the visualized lung bases. 4. Aortic atherosclerosis.  EG-sinus rhythm within normal limits  Endoscopy evidences of ulcer in 2019,  colonoscopy 2018-normal colonoscopy 2015 normal with 2 polyps removed  Assessment and Plan: Lower GI Bleed/Hematochezia -no further hematochezia -Hgb remains stable ~12 --1 bowel movement since admission, reporting diarrhea has improved -Hemoglobin stable at 13 ->> 12.8 -Hemoglobin positive x 2 -Continue with Protonix 40 mg IV twice daily -GI started diet advancing slowly -GI consulted -recommending no intervention at this point and cleared patient for dc  SIRS (systemic inflammatory response syndrome) (HCC) -Hemodynamically stable, - Patient met SIRS criteria-with leukocytosis and lactic acidosis -Likely diet due to gastroenteritis   CBC 19.0 >>> 16.2 >>> 13.3 Lactic acid 3.9 >>1.9  Lipase: 29 -sepsis ruled out  Leukocytosis - Continue leukocytosis, afebrile - Possible source of infection intra-abdominal -Monitoring WBC, lactic acid closely -Patient started on Unasyn >>d/c    Diarrhea - Improved diarrhea, -C. difficile negative -1 bowel movement since admission -Possibly due to gastroenteritis, Hemoccult positive x 2 -Continue gentle hydration--encouraging p.o. hydration   Nausea & vomiting - Possible infectious process enteritis-with GI bleed -Much improved -As needed Reglan -Status post IV fluid hydration -Advancing diet which the patient is tolerating   Abdominal pain -Improved abdominal pain Improved nausea vomiting -Improved diarrhea -Will continue with as needed IV/p.o. analgesics   GERD - Initiating Protonix IV 40 mg twice daily -Switching to p.o.   COPD (chronic obstructive pulmonary disease) (HCC) - Currently stable, on 2 L of oxygen -Monitor closely continue as needed DuoNeb bronchodilator inhalers - weaned to RA   Chronic pancreatitis (HCC) - CT abdomen pelvis regarding pancreatic contour within normal limits -Lipase level within normal limits -No signs of acute on chronic pancreatitis -tolerating diet at time of d/c  ANXIETY  DEPRESSION -As needed Ativan,  Home medication reviewed, Cymbalta, Lexapro, Atarax and home medication of Ativan reviewed Will be resumed accordingly      Consultants: GI Procedures performed: none  Disposition: Home Diet recommendation:  Regular diet DISCHARGE MEDICATION: Allergies as of 07/14/2023       Reactions   Motrin [ibuprofen] Anaphylaxis, Hives   Iohexol Hives, Swelling   Requires pre-meds   Ivp Dye [iodinated Contrast Media] Hives, Swelling   Requires pre-meds   Strawberry (diagnostic) Itching        Medication List     STOP taking these medications    doxycycline 100 MG capsule Commonly known as: VIBRAMYCIN   predniSONE 10 MG (21) Tbpk tablet Commonly known as: STERAPRED UNI-PAK 21 TAB       TAKE these medications    acetaminophen 325 MG tablet Commonly known as: TYLENOL Take 325 mg by mouth every 6 (six) hours as needed for mild pain.   albuterol 108 (90 Base) MCG/ACT inhaler Commonly known as: VENTOLIN HFA Inhale 2 puffs into the lungs every 4 (four) hours as needed for wheezing or shortness of breath.   Breztri Aerosphere 160-9-4.8 MCG/ACT Aero Generic drug: Budeson-Glycopyrrol-Formoterol Inhale 2 puffs into the lungs 2 (two) times daily.   CALCIUM + VITAMIN D3 PO Take 1 tablet by mouth daily.   cilostazol 100 MG tablet Commonly known as: PLETAL Take 100 mg by mouth 2 (two) times daily.   Creon 24000-76000 units Cpep Generic drug: Pancrelipase (Lip-Prot-Amyl) TAKE 2 CAPSULES WITH EACH MEALS AND 1 CAPSULE WITH SNACKS. MAX 2 SNACKS PER DAY.   cyclobenzaprine 5 MG tablet Commonly known as: FLEXERIL Take 5 mg by mouth at bedtime.   DULoxetine 60 MG capsule Commonly known as: CYMBALTA Take 60 mg by mouth 2 (two) times daily.   escitalopram 10 MG tablet Commonly known as: LEXAPRO Take 10 mg by mouth daily.   ezetimibe 10 MG tablet Commonly known as: ZETIA Take 10 mg by mouth daily.   fluticasone 50 MCG/ACT nasal  spray Commonly known as: FLONASE Place 2 sprays into both nostrils daily as needed for allergies.   hydrOXYzine 25 MG tablet Commonly known as: ATARAX Take 1 tablet by mouth as needed for itching.   LORazepam 1 MG tablet Commonly known as: ATIVAN Take 1 mg by mouth 4 (four) times daily.   lovastatin 10 MG tablet Commonly known as: MEVACOR Take 10 mg by mouth daily.   lubiprostone 24 MCG capsule Commonly known as: AMITIZA TAKE 1 CAPSULE BY MOUTH TWICE DAILY WITH A MEAL.   mupirocin cream 2 % Commonly known as: BACTROBAN Apply 1 Application topically 2 (two) times daily. What changed:  when to take this reasons to take this   ondansetron 4 MG tablet Commonly known as: ZOFRAN Take 4 mg by mouth daily as needed for nausea or vomiting.   oxyCODONE-acetaminophen 10-325 MG tablet Commonly known as: PERCOCET Take 1 tablet by mouth every 6 (six) hours as needed for pain.   pantoprazole 40 MG tablet Commonly known as: Protonix Take 1 tablet (40 mg total) by mouth 2 (two) times daily before a meal.   potassium chloride 10 MEQ tablet Commonly known as: KLOR-CON Take 10 mEq by mouth daily.   Prolia 60 MG/ML Sosy injection Generic drug: denosumab Inject 60 mg into the skin every 6 (six) months.   promethazine 25 MG tablet Commonly known as: PHENERGAN Take 25 mg by mouth daily as needed for nausea or vomiting.   triamcinolone cream  0.1 % Commonly known as: KENALOG Apply 1 Application topically 2 (two) times daily.        Discharge Exam: Filed Weights   07/12/23 0504 07/13/23 0458 07/14/23 0405  Weight: 33.3 kg 34 kg 34.6 kg   HEENT:  [place]/AT, No thrush, no icterus CV:  RRR, no rub, no S3, no S4 Lung:  diminished BS.  Bibasilar rales.  No wheeze Abd:  soft/+BS, NT Ext:  No edema, no lymphangitis, no synovitis, no rash   Condition at discharge: stable  The results of significant diagnostics from this hospitalization (including imaging, microbiology, ancillary and  laboratory) are listed below for reference.   Imaging Studies: CT ABDOMEN PELVIS WO CONTRAST  Result Date: 07/11/2023 CLINICAL DATA:  77 year old female with history of nausea, vomiting and diarrhea for 2 days with blood in stool since yesterday evening. EXAM: CT ABDOMEN AND PELVIS WITHOUT CONTRAST TECHNIQUE: Multidetector CT imaging of the abdomen and pelvis was performed following the standard protocol without IV contrast. RADIATION DOSE REDUCTION: This exam was performed according to the departmental dose-optimization program which includes automated exposure control, adjustment of the mA and/or kV according to patient size and/or use of iterative reconstruction technique. COMPARISON:  CT of the abdomen and pelvis 06/21/2022. FINDINGS: Lower chest: Advanced emphysematous changes are noted throughout the visualize lung bases. Atherosclerotic calcifications in the thoracic aorta. Hepatobiliary: No definite suspicious cystic or solid hepatic lesions are confidently identified on today's noncontrast CT examination. Unenhanced appearance of the gallbladder is unremarkable. Pancreas: No definite pancreatic mass or peripancreatic fluid collections or inflammatory changes are noted on today's noncontrast CT examination. Spleen: Unremarkable. Adrenals/Urinary Tract: Unenhanced appearance of the kidneys and bilateral adrenal glands is normal. No hydroureteronephrosis. Urinary bladder is are unremarkable in appearance. Stomach/Bowel: Unenhanced appearance of the stomach is unremarkable. No pathologic dilatation of small bowel or colon. The appendix is not confidently identified and may be surgically absent. Regardless, there are no inflammatory changes noted adjacent to the cecum to suggest the presence of an acute appendicitis at this time. Vascular/Lymphatic: Extensive atherosclerosis throughout the abdominal aorta and pelvic vasculature. No lymphadenopathy noted in the abdomen or pelvis. Reproductive: Status post  hysterectomy. Ovaries are not confidently identified may be surgically absent or atrophic. Other: No significant volume of ascites.  No pneumoperitoneum. Musculoskeletal: Compression fracture of superior endplate of L2, new compared to the prior study, with approximately 20% loss of anterior vertebral body height. Fracture line is still visible, although there appears to be some bony resorption associated with the fracture, suggesting a subacute injury. No surrounding soft tissue swelling. There are no aggressive appearing lytic or blastic lesions noted in the visualized portions of the skeleton. IMPRESSION: 1. New but likely subacute fracture of the anterior aspect of the superior endplate of L2 with 20% loss of anterior vertebral body height. 2. No other acute findings are noted in the abdomen or pelvis to account for the patient's symptoms on today's noncontrast CT examination. 3. Advanced emphysema in the visualized lung bases. 4. Aortic atherosclerosis. 5. Additional incidental findings, as above. Electronically Signed   By: Trudie Reed M.D.   On: 07/11/2023 05:16   DG Chest 2 View  Result Date: 07/03/2023 CLINICAL DATA:  Right chest pain, cough EXAM: CHEST - 2 VIEW COMPARISON:  06/25/2023 FINDINGS: The lungs are markedly hyperinflated and there is coarsening of the interstitial pattern in keeping with changes of advanced emphysema. There is developing focal infiltrate within the superior segment of the right lower lobe, possibly infectious in the  acute setting. No pneumothorax or pleural effusion. Cardiac size within normal limits. Pulmonary vascularity is normal. No acute bone abnormality. IMPRESSION: 1. Advanced emphysema. 2. Developing focal infiltrate within the superior segment of the right lower lobe, possibly infectious in the acute setting. Follow-up chest radiograph is recommended in 3-4 weeks to document resolution. Electronically Signed   By: Helyn Numbers M.D.   On: 07/03/2023 17:39    DG Chest 2 View  Result Date: 06/25/2023 CLINICAL DATA:  Chest pain EXAM: CHEST - 2 VIEW COMPARISON:  06/24/2022 FINDINGS: Severe emphysema. Heart and mediastinal contours are within normal limits. No focal opacities or effusions. No acute bony abnormality. IMPRESSION: Severe emphysema. No active cardiopulmonary disease. Electronically Signed   By: Charlett Nose M.D.   On: 06/25/2023 22:32    Microbiology: Results for orders placed or performed during the hospital encounter of 07/11/23  Culture, blood (Routine X 2) w Reflex to ID Panel     Status: None (Preliminary result)   Collection Time: 07/11/23  8:41 AM   Specimen: BLOOD RIGHT ARM  Result Value Ref Range Status   Specimen Description BLOOD RIGHT ARM AEROBIC BOTTLE ONLY  Final   Special Requests   Final    Blood Culture results may not be optimal due to an excessive volume of blood received in culture bottles   Culture   Final    NO GROWTH 3 DAYS Performed at [facility] [address] [place], [place] [address]    Report Status PENDING  Incomplete  Culture, blood (Routine X 2) w Reflex to ID Panel     Status: None (Preliminary result)   Collection Time: 07/11/23  8:41 AM   Specimen: BLOOD LEFT ARM  Result Value Ref Range Status   Specimen Description BLOOD LEFT ARM AEROBIC BOTTLE ONLY  Final   Special Requests   Final    Blood Culture results may not be optimal due to an excessive volume of blood received in culture bottles   Culture   Final    NO GROWTH 3 DAYS Performed at [facility], [address] [place], [place] [address]    Report Status PENDING  Incomplete    Labs: CBC: Recent Labs  Lab 07/11/23 0403 07/11/23 0633 07/11/23 2034 07/12/23 0419 07/12/23 0911 07/13/23 1224 07/14/23 0441  WBC 27.6*   < > 17.6* 14.2* 16.2* 9.5 13.3*  NEUTROABS 22.1*  --   --   --   --   --   --   HGB 13.4   < > 12.6 12.4 12.8 12.2 12.3  HCT 40.2   < > 36.4 36.3 38.7 35.8* 37.4  MCV 92.0   < > 90.5 91.4 92.8 92.3 94.2  PLT  316   < > 281 214 270 303 331   < > = values in this interval not displayed.   Basic Metabolic Panel: Recent Labs  Lab 07/11/23 0403 07/11/23 0719 07/12/23 0419 07/13/23 0455 07/14/23 0441  NA 137  --  137 135 138  K 3.3*  --  3.4* 2.7* 3.2*  CL 100  --  104 98 100  CO2 23  --  26 28 28   GLUCOSE 189*  --  79 94 93  BUN 23  --  12 6* <5*  CREATININE 0.63  --  0.41* 0.47 0.51  CALCIUM 9.2  --  8.1* 8.1* 8.4*  MG 1.8 1.7  --   --   --   PHOS  --  2.9  --   --   --  Liver Function Tests: Recent Labs  Lab 07/11/23 0403  AST 37  ALT 28  ALKPHOS 81  BILITOT 0.7  PROT 6.9  ALBUMIN 4.2   CBG: Recent Labs  Lab 07/12/23 0721 07/13/23 0742 07/14/23 0730  GLUCAP 72 96 80    Discharge time spent: greater than 30 minutes.  Signed: Catarina Hartshorn, MD [facility] 07/14/2023

## 2023-07-14 NOTE — Progress Notes (Signed)
  Subjective: States some chronic back pain flaring up today but denies abdominal pain. No BMs this morning, has not seen any blood per rectum except for slight tinge yesterday. Denies nausea or vomiting.   Objective: Vital signs in last 24 hours: Temp:  [97.6 F (36.4 C)-98.2 F (36.8 C)] 97.8 F (36.6 C) (10/16 0405) Pulse Rate:  [64-74] 69 (10/16 0405) Resp:  [17-18] 18 (10/16 0405) BP: (95-114)/(58-76) 114/76 (10/16 0405) SpO2:  [93 %-95 %] 93 % (10/16 0405) Weight:  [34.6 kg] 34.6 kg (10/16 0405) Last BM Date : 07/12/23 General:   Alert and oriented, pleasant Heart:  S1, S2 present, no murmurs noted.  Lungs: Clear to auscultation bilaterally, without wheezing, rales, or rhonchi.  Abdomen:  Bowel sounds present, soft, non-distended. TTP of RUQ (chronic)No HSM or hernias noted. No rebound or guarding. No masses appreciated  Neurologic:  Alert and  oriented x4;  grossly normal neurologically. Psych:  Alert and cooperative. Normal mood and affect.  Intake/Output from previous day: 10/15 0701 - 10/16 0700 In: 600 [P.O.:600] Out: 600 [Urine:600] Intake/Output this shift: No intake/output data recorded.  Lab Results: Recent Labs    07/12/23 0911 07/13/23 1224 07/14/23 0441  WBC 16.2* 9.5 13.3*  HGB 12.8 12.2 12.3  HCT 38.7 35.8* 37.4  PLT 270 303 331   BMET Recent Labs    07/12/23 0419 07/13/23 0455 07/14/23 0441  NA 137 135 138  K 3.4* 2.7* 3.2*  CL 104 98 100  CO2 26 28 28   GLUCOSE 79 94 93  BUN 12 6* <5*  CREATININE 0.41* 0.47 0.51  CALCIUM 8.1* 8.1* 8.4*   LFT No results for input(s): "PROT", "ALBUMIN", "AST", "ALT", "ALKPHOS", "BILITOT", "BILIDIR", "IBILI" in the last 72 hours. PT/INR Recent Labs    07/12/23 0419  LABPROT 14.4  INR 1.1    Assessment: 77 y.o. female with a history of chronic pancreatitis, failure to thrive, OIC, advanced COPD, admitted currently with SIRS and acute symptoms of N/V, diarrhea, and rectal bleeding   Patient had  resolution of diarrhea by time of admission, but continued with intermittent rectal bleeding which has tapered off. Very small tinge on tissue yesterday morning, no BMs or BRB per rectum today. She denies abdominal pain. Stool studies not completed as diarrhea resolved.  Encouragingly, Hgb has remained within normal limits, 12.3 this morning.   Etiology of rectal bleeding unclear. No acute findings on CT this admission. Differentials include diverticular bleed vs. Ischemic injury. Considering rectal bleeding seems to have tapered off and hemoglobin remained within normal limits, will plan for outpatient colonoscopy which I discussed with the patient and Dr. Arbutus Leas. GI will sign off for discharge today.    Plan: Plan for outpatient colonoscopy Outpatient GI follow up in 2-3 weeks which our office will arrange  GI will sign off today   LOS: 2 days    07/14/2023, 9:13 AM  Roma Bondar L. Jeanmarie Hubert, MSN, APRN, AGNP-C Adult-Gerontology Nurse Practitioner [facility] at [facility]

## 2023-07-14 NOTE — Telephone Encounter (Signed)
See inpatient note from 10/16

## 2023-07-16 LAB — CULTURE, BLOOD (ROUTINE X 2)
Culture: NO GROWTH
Culture: NO GROWTH

## 2023-08-03 ENCOUNTER — Ambulatory Visit: Payer: 59 | Admitting: Internal Medicine

## 2023-08-06 ENCOUNTER — Encounter: Payer: Self-pay | Admitting: Internal Medicine

## 2023-08-06 ENCOUNTER — Ambulatory Visit (INDEPENDENT_AMBULATORY_CARE_PROVIDER_SITE_OTHER): Payer: 59 | Admitting: Internal Medicine

## 2023-08-06 VITALS — BP 120/82 | HR 128 | Temp 97.7°F | Ht 64.0 in | Wt 72.0 lb

## 2023-08-06 DIAGNOSIS — K5900 Constipation, unspecified: Secondary | ICD-10-CM | POA: Diagnosis not present

## 2023-08-06 DIAGNOSIS — R110 Nausea: Secondary | ICD-10-CM | POA: Diagnosis not present

## 2023-08-06 DIAGNOSIS — R627 Adult failure to thrive: Secondary | ICD-10-CM

## 2023-08-06 DIAGNOSIS — Z860100 Personal history of colon polyps, unspecified: Secondary | ICD-10-CM

## 2023-08-06 DIAGNOSIS — R64 Cachexia: Secondary | ICD-10-CM

## 2023-08-06 DIAGNOSIS — K859 Acute pancreatitis without necrosis or infection, unspecified: Secondary | ICD-10-CM

## 2023-08-06 DIAGNOSIS — K861 Other chronic pancreatitis: Secondary | ICD-10-CM

## 2023-08-06 DIAGNOSIS — K219 Gastro-esophageal reflux disease without esophagitis: Secondary | ICD-10-CM

## 2023-08-06 DIAGNOSIS — R634 Abnormal weight loss: Secondary | ICD-10-CM

## 2023-08-06 DIAGNOSIS — F17210 Nicotine dependence, cigarettes, uncomplicated: Secondary | ICD-10-CM

## 2023-08-06 DIAGNOSIS — J438 Other emphysema: Secondary | ICD-10-CM

## 2023-08-06 NOTE — Progress Notes (Signed)
Primary Care Physician:  Benita Stabile, MD Primary Gastroenterologist:  Dr. Jena Gauss  Pre-Procedure History & Physical: HPI:  [name] is a 77 y.o. female here in hospital follow-up.  This very small framed individual with COPD longstanding chronic pancreatitis admitted to [facility] recently with SIRS.  Presented acutely with nausea vomiting diarrhea with some blood per rectum elevated white count and lactic acid.  She was treated with antibiotics. CT scan demonstrated no acute intra-abdominal abnormalities.  Fairly new L2 compression fracture.  C. difficile negative.  She rapidly recovered back to her baseline.  She weighs 72 pounds today and her lifetime baseline hovers around 87 pounds.  She is always been thin.  Nauseated does not eat very much.  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  She has been on pancreatic enzymes for many years.  Chronic abdominal pain.  Celiac block attempted previously but because of anatomy could not be accomplished.  Colonoscopy previously did demonstrate adenomatous polyps; was due for surveillance 2023-not yet done.  She has chronic nausea early satiety takes twice daily Protonix.  Continues on pancreatic enzymes.  Chronic constipation takes Amitiza with meals twice daily feels that helps with her bowel function does not note a temporal relationship between nausea and Amitiza. Widely patent mesenteric vasculature on 2018 CTA abdomen.  It is worth noting this patient has been a heavy smoker since age 89.  She has advanced COPD.  She previously was followed by pulmonologist Dr. Juanetta Gosling but no pulmonary follow-up in years.  Moreover, I do not see a screening chest CT in the record.  Past Medical History:  Diagnosis Date   Allergic rhinitis    Anxiety    Anxiety and depression    Back pain, chronic    CAD (coronary artery disease)    palpitations, dizziness, chest pain   Cervical cancer (HCC)     cervical   Chronic abdominal pain    with chronic nausea, diarrhea   Collagen vascular disease (HCC)    COPD (chronic obstructive pulmonary disease) (HCC)    Depression    GERD (gastroesophageal reflux disease)    Helicobacter pylori infection 2015   treated with prevpac   Hematochezia    History of kidney stones    Hx of colonic polyps    adenomatous   Hyperlipidemia    Lipid profile on 02/25/2011: 209, 113, 55, 132   Nephrolithiasis    Pancreatitis chronic    Renal calculus    Stroke (HCC) 3-4 yrs ago   left sided weakness   Tobacco abuse    Tubular adenoma 2015   Weight loss    CT negative for occult malignancy, negative celiac, negative adrenal insufficiency    Past Surgical History:  Procedure Laterality Date   ABDOMINAL HYSTERECTOMY     APPENDECTOMY     CATARACT EXTRACTION W/PHACO Right 05/04/2013   Procedure: CATARACT EXTRACTION PHACO AND INTRAOCULAR LENS PLACEMENT (IOC);  Surgeon: Gemma Payor, MD;  Location: [facility];  Service: Ophthalmology;  Laterality: Right;  CDE:16.17   CATARACT EXTRACTION W/PHACO Left 05/22/2013   Procedure: CATARACT EXTRACTION PHACO AND INTRAOCULAR LENS PLACEMENT (IOC);  Surgeon: Gemma Payor, MD;  Location: [facility];  Service: Ophthalmology;  Laterality: Left;  CDE:14.75   COLONOSCOPY N/A 02/08/2014   Dr.Delmus Warwick- normal rectum, colonic polyps bx=tubular adenoma   COLONOSCOPY W/ POLYPECTOMY  2006   WUJ:[id number], benign gastric nodule, multiple adenomatous polyps, one with tubular morphology   COLONOSCOPY WITH PROPOFOL N/A 04/15/2017  normal   ESOPHAGOGASTRODUODENOSCOPY  2006   NWG:NFAOZH gastric nodule   ESOPHAGOGASTRODUODENOSCOPY N/A 02/08/2014   Dr.Patrice Moates- abnormal stomach and gstric nodule bx= hpylori   EUS  2010   Dr. Christella Hartigan : Multiple shadowing calcifications in pancreas, consistent with Chronic Pancreatitis.  These are mainly confined to a collection of calcifications in head/uncinate pancreas. Otherwise the pancreatic parenchyma appears fairly  normal.  The main pancreatic duct, CBD are both normal without stones, dilation.     Ileocolonoscopy  01/11/2009   RMR: Polyp at the splenic flexure, status post hot snare removal/ Normal rectum, tubular adenoma   PARTIAL HYSTERECTOMY      Prior to Admission medications   Medication Sig Start Date End Date Taking? Authorizing Provider  acetaminophen (TYLENOL) 325 MG tablet Take 325 mg by mouth every 6 (six) hours as needed for mild pain. 08/31/17  Yes [provider]  albuterol (PROVENTIL HFA;VENTOLIN HFA) 108 (90 Base) MCG/ACT inhaler Inhale 2 puffs into the lungs every 4 (four) hours as needed for wheezing or shortness of breath. 02/12/18  Yes Samuel Jester, DO  Budeson-Glycopyrrol-Formoterol (BREZTRI AEROSPHERE) 160-9-4.8 MCG/ACT AERO Inhale 2 puffs into the lungs 2 (two) times daily.   Yes [provider]  Calcium Carb-Cholecalciferol (CALCIUM + VITAMIN D3 PO) Take 1 tablet by mouth daily.   Yes [provider]  cilostazol (PLETAL) 100 MG tablet Take 100 mg by mouth 2 (two) times daily.  03/29/15  Yes [provider]  cyclobenzaprine (FLEXERIL) 5 MG tablet Take 5 mg by mouth at bedtime. 05/31/18  Yes [provider]  DULoxetine (CYMBALTA) 60 MG capsule Take 60 mg by mouth 2 (two) times daily.    Yes [provider]  escitalopram (LEXAPRO) 10 MG tablet Take 10 mg by mouth daily. 01/30/17  Yes [provider]  ezetimibe (ZETIA) 10 MG tablet Take 10 mg by mouth daily. 06/11/23  Yes [provider]  fluticasone (FLONASE) 50 MCG/ACT nasal spray Place 2 sprays into both nostrils daily as needed for allergies. 01/13/23  Yes [provider]  hydrOXYzine (ATARAX/VISTARIL) 25 MG tablet Take 1 tablet by mouth as needed for itching. 04/21/16  Yes [provider]  LORazepam (ATIVAN) 1 MG tablet Take 1 mg by mouth 4 (four) times daily.   Yes [provider]  lovastatin (MEVACOR) 10 MG tablet Take 10 mg by mouth  daily. 06/23/23  Yes [provider]  lubiprostone (AMITIZA) 24 MCG capsule TAKE 1 CAPSULE BY MOUTH TWICE DAILY WITH A MEAL. 02/25/23  Yes Gelene Mink, NP  mupirocin cream (BACTROBAN) 2 % Apply 1 Application topically 2 (two) times daily. Patient taking differently: Apply 1 Application topically 2 (two) times daily as needed (eczema). 06/15/22  Yes Eber Hong, MD  ondansetron (ZOFRAN-ODT) 4 MG disintegrating tablet Take 4 mg by mouth daily. 07/21/23  Yes [provider]  oxyCODONE-acetaminophen (PERCOCET) 10-325 MG tablet Take 1 tablet by mouth every 6 (six) hours as needed for pain. 12/27/20  Yes [provider]  Pancrelipase, Lip-Prot-Amyl, (CREON) 24000-76000 units CPEP TAKE 2 CAPSULES WITH EACH MEALS AND 1 CAPSULE WITH SNACKS. MAX 2 SNACKS PER DAY. 11/10/22  Yes Gelene Mink, NP  pantoprazole (PROTONIX) 40 MG tablet Take 1 tablet (40 mg total) by mouth 2 (two) times daily before a meal. 07/16/21  Yes Gelene Mink, NP  PROLIA 60 MG/ML SOSY injection Inject 60 mg into the skin every 6 (six) months.  05/31/18  Yes [provider]  promethazine (PHENERGAN) 25 MG  tablet Take 25 mg by mouth daily as needed for nausea or vomiting.   Yes [provider]  triamcinolone cream (KENALOG) 0.1 % Apply 1 Application topically 2 (two) times daily. 07/03/23  Yes Eber Hong, MD  megestrol (MEGACE) 40 MG/ML suspension Take 400 mg by mouth daily. Patient not taking: Reported on 08/06/2023 07/21/23   [provider]  potassium chloride (KLOR-CON) 10 MEQ tablet Take 10 mEq by mouth daily. Patient not taking: Reported on 08/06/2023 06/23/23   [provider]    Allergies as of 08/06/2023 - Review Complete 08/06/2023  Allergen Reaction Noted   Motrin [ibuprofen] Anaphylaxis and Hives 12/19/2008   Iohexol Hives and Swelling 05/23/2004   Ivp dye [iodinated contrast media] Hives and Swelling    Strawberry (diagnostic) Itching 11/09/2015    Family History   Problem Relation Age of Onset   Heart attack Father 19   Colon cancer Maternal Grandmother     Social History   Socioeconomic History   Marital status: Divorced    Spouse name: Not on file   Number of children: 3   Years of education: Not on file   Highest education level: Not on file  Occupational History   Occupation: disabled    Employer: UNEMPLOYED  Tobacco Use   Smoking status: Every Day    Current packs/day: 0.75    Average packs/day: 0.8 packs/day for 61.0 years (45.8 ttl pk-yrs)    Types: Cigarettes    Start date: 07/18/1962   Smokeless tobacco: Never   Tobacco comments:    3/4 pack daily  Vaping Use   Vaping status: Never Used  Substance and Sexual Activity   Alcohol use: No    Alcohol/week: 0.0 standard drinks of alcohol    Comment: hx of ETOH about 20 years ago.    Drug use: No   Sexual activity: Never    Birth control/protection: Surgical  Other Topics Concern   Not on file  Social History Narrative   Not on file   Social Determinants of Health   Financial Resource Strain: Not on file  Food Insecurity: No Food Insecurity (06/22/2022)   Hunger Vital Sign    Worried About Running Out of Food in the Last Year: Never true    Ran Out of Food in the Last Year: Never true  Transportation Needs: No Transportation Needs (06/22/2022)   PRAPARE - Administrator, Civil Service (Medical): No    Lack of Transportation (Non-Medical): No  Physical Activity: Not on file  Stress: Not on file  Social Connections: Not on file  Intimate Partner Violence: Not At Risk (06/22/2022)   Humiliation, Afraid, Rape, and Kick questionnaire    Fear of Current or Ex-Partner: No    Emotionally Abused: No    Physically Abused: No    Sexually Abused: No    Review of Systems: See HPI, otherwise negative ROS  Physical Exam: BP 120/82 (BP Location: Right Arm, Patient Position: Sitting, Cuff Size: Small)   Pulse (!) 128   Temp 97.7 F (36.5 C) (Oral)   Ht 5\' 4"   (1.626 m)   Wt 72 lb (32.7 kg)   SpO2 95%   BMI 12.36 kg/m  General:   Cachectic appearing lady appears in no acute distress.  Disconjugate gaze at baseline. Neck:  Supple; no masses or thyromegaly. No significant cervical adenopathy. Lungs:  Clear throughout to auscultation.   No wheezes, crackles, or rhonchi. No acute distress. Heart:  Regular rate and rhythm; no  murmurs, clicks, rubs,  or gallops. Abdomen: Non-distended, normal bowel sounds.  Soft and nontender without appreciable mass or hepatosplenomegaly.   Impression/Plan: 77 year old cachectic lady with advanced COPD, chronic pancreatic exocrine insufficiency recently admitted with gastroenteritis manifested as SIRS.  She rapidly improved in the hospital.  No discrete etiology found. She is chronically nauseated and constipated.  Her lifelong adult baseline weight is in the 87 range; now 72 pounds.  Recent inpatient GI evaluation somewhat reassuring for any new underlying occult diagnosis.  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  Recommendations: No change in medication regimen at this time.  Screening chest CT given symptoms of weight loss and weakness in the setting of long-term smoking history.    Diagnostic EGD and colonoscopy (weight loss nausea, colonic polyps) ASA 3.  The risks, benefits, limitations, alternatives and imponderables have been reviewed with the patient. Questions have been answered. All parties are agreeable.    Will plan to use the pediatric colonoscope for the colonoscopy.  Further recommendations to follow.           Notice: This dictation was prepared with Dragon dictation along with smaller phrase technology. Any transcriptional errors that result from this process are unintentional and may not be corrected upon review.

## 2023-08-06 NOTE — Patient Instructions (Signed)
It was good to see you again today!  No change in your medication regimen at this time.  It is recommended you have a screening chest CT given your symptoms of weight loss and weakness and long-term smoking history.  This CT is done without contrast or dye.  You also need to have a diagnostic EGD and a colonoscopy (weight loss nausea, colonic polyps) ASA 3.  We will alert the endoscopy department that I will use the pediatric colonoscope to make the exam easier for you  Further recommendations to follow.

## 2023-08-09 ENCOUNTER — Telehealth: Payer: Self-pay | Admitting: *Deleted

## 2023-08-09 DIAGNOSIS — J438 Other emphysema: Secondary | ICD-10-CM

## 2023-08-09 DIAGNOSIS — R634 Abnormal weight loss: Secondary | ICD-10-CM

## 2023-08-09 NOTE — Telephone Encounter (Signed)
Pt informed Chest Ct is scheduled for 08/13/23, arrive at 6:15 pm, check in through ER. Pt verbalized understanding. Will call pt to schedule EGD/TCS once get providers January schedule.

## 2023-08-09 NOTE — Telephone Encounter (Signed)
[employer] PA: CPT Code 08657 Description: CT THORAX W/O CONTRAST Case Number: [id number] Review Date: 08/09/2023 9:45:20 AM Expiration Date: N/A Status: This member's benefit plan did not require a prior authorization for this request.

## 2023-08-13 ENCOUNTER — Ambulatory Visit (HOSPITAL_COMMUNITY)
Admission: RE | Admit: 2023-08-13 | Discharge: 2023-08-13 | Disposition: A | Discharge status: Home / Self Care | Payer: 59 | Source: Ambulatory Visit | Attending: Internal Medicine | Admitting: Internal Medicine

## 2023-08-13 DIAGNOSIS — R634 Abnormal weight loss: Secondary | ICD-10-CM | POA: Diagnosis present

## 2023-08-13 DIAGNOSIS — J438 Other emphysema: Secondary | ICD-10-CM | POA: Insufficient documentation

## 2023-08-20 ENCOUNTER — Other Ambulatory Visit: Payer: Self-pay | Admitting: Gastroenterology

## 2023-08-23 NOTE — Progress Notes (Signed)
CT was completed on 08/13/2023

## 2023-09-02 ENCOUNTER — Telehealth: Payer: Self-pay | Admitting: *Deleted

## 2023-09-02 MED ORDER — PEG 3350-KCL-NA BICARB-NACL 420 G PO SOLR: 4000.0000 mL | Freq: Once | ORAL | 0 refills | Status: AC

## 2023-09-02 NOTE — Telephone Encounter (Signed)
Spoke with pt. Scheduled for TCS/EGD with Dr. Jena Gauss, ASA 3 on 1/15. Aware will send instructions to her. Will call back with pre-op appt. Rx for prep will be sent to pharmacy. Confirmed pharmacy and address. Reports insurance will not change.

## 2023-09-03 NOTE — Telephone Encounter (Signed)
Called pt, no answer and not able to leave VM. Letter mailed

## 2023-09-09 ENCOUNTER — Other Ambulatory Visit: Payer: Self-pay | Admitting: Gastroenterology

## 2023-10-07 ENCOUNTER — Encounter: Payer: Self-pay | Admitting: *Deleted

## 2023-10-07 NOTE — Telephone Encounter (Signed)
 Pt called and stated she is sick and needed to reschedule. Pt has been rescheduled from 10/13/23  until 11/10/23., updated instructions mailed to pt

## 2023-10-07 NOTE — Patient Instructions (Signed)
 [name]  10/07/2023     @PREFPERIOPPHARMACY @   Your procedure is scheduled on  10/13/2023.   [facility] to [facility] at  1130 am A.M.   Call this number if you have problems the morning of surgery:  [phone]  If you experience any cold or flu symptoms such as cough, fever, chills, shortness of breath, etc. between now and your scheduled surgery, please notify us  at the above number.   Remember:          Use your inhalers before you come and bring your rescue inhaler with you.   Follow the diet and prep instructions given to you by the office.    You may drink clear liquids until 0930 am on 10/13/2023.    Clear liquids allowed are:                    Water , Juice (No red color; non-citric and without pulp; diabetics please choose diet or no sugar options), Carbonated beverages (diabetics please choose diet or no sugar options), Clear Tea (No creamer, milk, or cream, including half & half and powdered creamer), Black Coffee Only (No creamer, milk or cream, including half & half and powdered creamer), and Clear Sports drink (No red color; diabetics please choose diet or no sugar options)    Take these medicines the morning of surgery with A SIP OF WATER        duloxetine , escitalopram , hydroxyzine , lorazepam  (if needed), oxycodone  (if needed), pantoprazole .    Do not wear jewelry, make-up or nail polish, including gel polish,  artificial nails, or any other type of covering on natural nails (fingers and  toes).  Do not wear lotions, powders, or perfumes, or deodorant.  Do not shave 48 hours prior to surgery.  Men may shave face and neck.  Do not bring valuables to the hospital.  [facility] is not responsible for any belongings or valuables.  Contacts, dentures or bridgework may not be worn into surgery.  Leave your suitcase in the car.  After surgery it may be brought to your room.  For patients admitted to the hospital, discharge time will be determined  by your treatment team.  Patients discharged the day of surgery will not be allowed to drive home and must have someone with them for 24 hours.    Special instructions:   DO NOT smoke tobacco or vape for 24 hours before your procedure.  Please read over the following fact sheets that you were given. Anesthesia Post-op Instructions and Care and Recovery After Surgery      Upper Endoscopy, Adult, Care After After the procedure, it is common to have a sore throat. It is also common to have: Mild stomach pain or discomfort. Bloating. Nausea. Follow these instructions at home: The instructions below may help you care for yourself at home. Your health care provider may give you more instructions. If you have questions, ask your health care provider. If you were given a sedative during the procedure, it can affect you for several hours. Do not drive or operate machinery until your health care provider says that it is safe. If you will be going home right after the procedure, plan to have a responsible adult: Take you home from the hospital or clinic. You will not be allowed to drive. Care for you for the time you are told. Follow instructions from your health care provider about what you may eat and  drink. Return to your normal activities as told by your health care provider. Ask your health care provider what activities are safe for you. Take over-the-counter and prescription medicines only as told by your health care provider. Contact a health care provider if you: Have a sore throat that lasts longer than one day. Have trouble swallowing. Have a fever. Get help right away if you: Vomit blood or your vomit looks like coffee grounds. Have bloody, black, or tarry stools. Have a very bad sore throat or you cannot swallow. Have difficulty breathing or very bad pain in your chest or abdomen. These symptoms may be an emergency. Get help right away. Call 911. Do not wait to see if the  symptoms will go away. Do not drive yourself to the hospital. Summary After the procedure, it is common to have a sore throat, mild stomach discomfort, bloating, and nausea. If you were given a sedative during the procedure, it can affect you for several hours. Do not drive until your health care provider says that it is safe. Follow instructions from your health care provider about what you may eat and drink. Return to your normal activities as told by your health care provider. This information is not intended to replace advice given to you by your health care provider. Make sure you discuss any questions you have with your health care provider. Document Revised: 12/24/2021 Document Reviewed: 12/24/2021 Elsevier Patient Education  2024 [employer] Colonoscopy, Adult, Care After The following information offers guidance on how to care for yourself after your procedure. Your health care provider may also give you more specific instructions. If you have problems or questions, contact your health care provider. What can I expect after the procedure? After the procedure, it is common to have: A small amount of blood in your stool for 24 hours after the procedure. Some gas. Mild cramping or bloating of your abdomen. Follow these instructions at home: Eating and drinking  Drink enough fluid to keep your urine pale yellow. Follow instructions from your health care provider about eating or drinking restrictions. Resume your normal diet as told by your health care provider. Avoid heavy or fried foods that are hard to digest. Activity Rest as told by your health care provider. Avoid sitting for a long time without moving. Get up to take short walks every 1-2 hours. This is important to improve blood flow and breathing. Ask for help if you feel weak or unsteady. Return to your normal activities as told by your health care provider. Ask your health care provider what activities are safe for  you. Managing cramping and bloating  Try walking around when you have cramps or feel bloated. If directed, apply heat to your abdomen as told by your health care provider. Use the heat source that your health care provider recommends, such as a moist heat pack or a heating pad. Place a towel between your skin and the heat source. Leave the heat on for 20-30 minutes. Remove the heat if your skin turns bright red. This is especially important if you are unable to feel pain, heat, or cold. You have a greater risk of getting burned. General instructions If you were given a sedative during the procedure, it can affect you for several hours. Do not drive or operate machinery until your health care provider says that it is safe. For the first 24 hours after the procedure: Do not sign important documents. Do not drink alcohol. Do your regular daily activities at  a slower pace than normal. Eat soft foods that are easy to digest. Take over-the-counter and prescription medicines only as told by your health care provider. Keep all follow-up visits. This is important. Contact a health care provider if: You have blood in your stool 2-3 days after the procedure. Get help right away if: You have more than a small spotting of blood in your stool. You have large blood clots in your stool. You have swelling of your abdomen. You have nausea or vomiting. You have a fever. You have increasing pain in your abdomen that is not relieved with medicine. These symptoms may be an emergency. Get help right away. Call 911. Do not wait to see if the symptoms will go away. Do not drive yourself to the hospital. Summary After the procedure, it is common to have a small amount of blood in your stool. You may also have mild cramping and bloating of your abdomen. If you were given a sedative during the procedure, it can affect you for several hours. Do not drive or operate machinery until your health care provider says  that it is safe. Get help right away if you have a lot of blood in your stool, nausea or vomiting, a fever, or increased pain in your abdomen. This information is not intended to replace advice given to you by your health care provider. Make sure you discuss any questions you have with your health care provider. Document Revised: 10/27/2022 Document Reviewed: 05/07/2021 Elsevier Patient Education  2024 [employer] Monitored Anesthesia Care, Care After The following information offers guidance on how to care for yourself after your procedure. Your health care provider may also give you more specific instructions. If you have problems or questions, contact your health care provider. What can I expect after the procedure? After the procedure, it is common to have: Tiredness. Little or no memory about what happened during or after the procedure. Impaired judgment when it comes to making decisions. Nausea or vomiting. Some trouble with balance. Follow these instructions at home: For the time period you were told by your health care provider:  Rest. Do not participate in activities where you could fall or become injured. Do not drive or use machinery. Do not drink alcohol. Do not take sleeping pills or medicines that cause drowsiness. Do not make important decisions or sign legal documents. Do not take care of children on your own. Medicines Take over-the-counter and prescription medicines only as told by your health care provider. If you were prescribed antibiotics, take them as told by your health care provider. Do not stop using the antibiotic even if you start to feel better. Eating and drinking Follow instructions from your health care provider about what you may eat and drink. Drink enough fluid to keep your urine pale yellow. If you vomit: Drink clear fluids slowly and in small amounts as you are able. Clear fluids include water , ice chips, low-calorie sports drinks, and fruit  juice that has water  added to it (diluted fruit juice). Eat light and bland foods in small amounts as you are able. These foods include bananas, applesauce, rice, lean meats, toast, and crackers. General instructions  Have a responsible adult stay with you for the time you are told. It is important to have someone help care for you until you are awake and alert. If you have sleep apnea, surgery and some medicines can increase your risk for breathing problems. Follow instructions from your health care provider about wearing your sleep  device: When you are sleeping. This includes during daytime naps. While taking prescription pain medicines, sleeping medicines, or medicines that make you drowsy. Do not use any products that contain nicotine or tobacco. These products include cigarettes, chewing tobacco, and vaping devices, such as e-cigarettes. If you need help quitting, ask your health care provider. Contact a health care provider if: You feel nauseous or vomit every time you eat or drink. You feel light-headed. You are still sleepy or having trouble with balance after 24 hours. You get a rash. You have a fever. You have redness or swelling around the IV site. Get help right away if: You have trouble breathing. You have new confusion after you get home. These symptoms may be an emergency. Get help right away. Call 911. Do not wait to see if the symptoms will go away. Do not drive yourself to the hospital. This information is not intended to replace advice given to you by your health care provider. Make sure you discuss any questions you have with your health care provider. Document Revised: 02/09/2022 Document Reviewed: 02/09/2022 Elsevier Patient Education  2024 [employer].

## 2023-10-08 ENCOUNTER — Encounter (HOSPITAL_COMMUNITY)
Admission: RE | Admit: 2023-10-08 | Discharge: 2023-10-08 | Disposition: A | Discharge status: Home / Self Care | Payer: 59 | Source: Ambulatory Visit | Attending: Internal Medicine | Admitting: Internal Medicine

## 2023-10-08 ENCOUNTER — Encounter (HOSPITAL_COMMUNITY): Payer: Self-pay

## 2023-10-08 DIAGNOSIS — E876 Hypokalemia: Secondary | ICD-10-CM

## 2023-11-02 ENCOUNTER — Encounter (HOSPITAL_COMMUNITY): Payer: Self-pay | Admitting: Family Medicine

## 2023-11-02 ENCOUNTER — Ambulatory Visit (HOSPITAL_COMMUNITY)
Admission: RE | Admit: 2023-11-02 | Discharge: 2023-11-02 | Disposition: A | Discharge status: Home / Self Care | Payer: 59 | Source: Ambulatory Visit | Attending: Family Medicine | Admitting: Family Medicine

## 2023-11-02 ENCOUNTER — Other Ambulatory Visit (HOSPITAL_COMMUNITY): Payer: Self-pay | Admitting: Family Medicine

## 2023-11-02 DIAGNOSIS — R0789 Other chest pain: Secondary | ICD-10-CM

## 2023-11-04 NOTE — Patient Instructions (Signed)
 Your procedure is scheduled on: 11/10/2023  Report to [facility] Main Entrance at    11:30 AM.  Call this number if you have problems the morning of surgery: [phone]   Remember:              Follow Directions on the letter you received from Your Physician's office regarding the Bowel Prep  You may have CLEAR liquids until 9:30 am Water , tea, Black coffee NO CREAM/MILK              No Smoking the day of Procedure :   Take these medicines the morning of surgery with A SIP OF WATER : Cymbalta , Flexeril , lexapro , Ativan , pantoprazole , zofran  and/or Oxycodone  if needed  Use inhalers if needed   Do not wear jewelry, make-up or nail polish.    Do not bring valuables to the hospital.  Contacts, dentures or bridgework may not be worn into surgery.  .   Patients discharged the day of surgery will not be allowed to drive home.     Colonoscopy, Adult, Care After This sheet gives you information about how to care for yourself after your procedure. Your health care provider may also give you more specific instructions. If you have problems or questions, contact your health care provider. What can I expect after the procedure? After the procedure, it is common to have: A small amount of blood in your stool for 24 hours after the procedure. Some gas. Mild abdominal cramping or bloating.  Follow these instructions at home: General instructions  For the first 24 hours after the procedure: Do not drive or use machinery. Do not sign important documents. Do not drink alcohol. Do your regular daily activities at a slower pace than normal. Eat soft, easy-to-digest foods. Rest often. Take over-the-counter or prescription medicines only as told by your health care provider. It is up to you to get the results of your procedure. Ask your health care provider, or the department performing the procedure, when your results will be ready. Relieving cramping and bloating Try walking around when  you have cramps or feel bloated. Apply heat to your abdomen as told by your health care provider. Use a heat source that your health care provider recommends, such as a moist heat pack or a heating pad. Place a towel between your skin and the heat source. Leave the heat on for 20-30 minutes. Remove the heat if your skin turns bright red. This is especially important if you are unable to feel pain, heat, or cold. You may have a greater risk of getting burned. Eating and drinking Drink enough fluid to keep your urine clear or pale yellow. Resume your normal diet as instructed by your health care provider. Avoid heavy or fried foods that are hard to digest. Avoid drinking alcohol for as long as instructed by your health care provider. Contact a health care provider if: You have blood in your stool 2-3 days after the procedure. Get help right away if: You have more than a small spotting of blood in your stool. You pass large blood clots in your stool. Your abdomen is swollen. You have nausea or vomiting. You have a fever. You have increasing abdominal pain that is not relieved with medicine. This information is not intended to replace advice given to you by your health care provider. Make sure you discuss any questions you have with your health care provider. Document Released: 04/28/2004 Document Revised: 06/08/2016 Document Reviewed: 11/26/2015 Elsevier Interactive Patient Education  2018 [employer]. Upper Endoscopy, Adult, Care After After the procedure, it is common to have a sore throat. It is also common to have: Mild stomach pain or discomfort. Bloating. Nausea. Follow these instructions at home: The instructions below may help you care for yourself at home. Your health care provider may give you more instructions. If you have questions, ask your health care provider. If you were given a sedative during the procedure, it can affect you for several hours. Do not drive or operate  machinery until your health care provider says that it is safe. If you will be going home right after the procedure, plan to have a responsible adult: Take you home from the hospital or clinic. You will not be allowed to drive. Care for you for the time you are told. Follow instructions from your health care provider about what you may eat and drink. Return to your normal activities as told by your health care provider. Ask your health care provider what activities are safe for you. Take over-the-counter and prescription medicines only as told by your health care provider. Contact a health care provider if you: Have a sore throat that lasts longer than one day. Have trouble swallowing. Have a fever. Get help right away if you: Vomit blood or your vomit looks like coffee grounds. Have bloody, black, or tarry stools. Have a very bad sore throat or you cannot swallow. Have difficulty breathing or very bad pain in your chest or abdomen. These symptoms may be an emergency. Get help right away. Call 911. Do not wait to see if the symptoms will go away. Do not drive yourself to the hospital. Summary After the procedure, it is common to have a sore throat, mild stomach discomfort, bloating, and nausea. If you were given a sedative during the procedure, it can affect you for several hours. Do not drive until your health care provider says that it is safe. Follow instructions from your health care provider about what you may eat and drink. Return to your normal activities as told by your health care provider. This information is not intended to replace advice given to you by your health care provider. Make sure you discuss any questions you have with your health care provider. Document Revised: 12/24/2021 Document Reviewed: 12/24/2021 Elsevier Patient Education  2024 [employer].

## 2023-11-05 ENCOUNTER — Encounter (HOSPITAL_COMMUNITY)
Admission: RE | Admit: 2023-11-05 | Discharge: 2023-11-05 | Disposition: A | Discharge status: Home / Self Care | Payer: 59 | Source: Ambulatory Visit | Attending: Internal Medicine

## 2023-11-05 DIAGNOSIS — E876 Hypokalemia: Secondary | ICD-10-CM

## 2023-11-09 ENCOUNTER — Telehealth: Payer: Self-pay | Admitting: *Deleted

## 2023-11-09 NOTE — Telephone Encounter (Signed)
Pt called in to cancel double procedure with Dr. Jena Gauss tomorrow. Reports she is very sick and will probably have to go to ER. She had to restart using her O2 again.  I advised will need to see back in office prior to rescheduling.

## 2023-11-10 ENCOUNTER — Ambulatory Visit (HOSPITAL_COMMUNITY): Admission: RE | Admit: 2023-11-10 | Payer: 59 | Source: Home / Self Care | Admitting: Internal Medicine

## 2023-11-10 ENCOUNTER — Encounter (HOSPITAL_COMMUNITY): Admission: RE | Payer: Self-pay | Source: Home / Self Care

## 2023-11-10 SURGERY — COLONOSCOPY WITH PROPOFOL
Anesthesia: Choice

## 2023-11-17 ENCOUNTER — Other Ambulatory Visit: Payer: Self-pay | Admitting: Gastroenterology

## 2023-11-26 ENCOUNTER — Encounter: Payer: Self-pay | Admitting: Internal Medicine

## 2024-04-07 ENCOUNTER — Encounter (HOSPITAL_COMMUNITY): Payer: Self-pay

## 2024-04-07 ENCOUNTER — Other Ambulatory Visit: Payer: Self-pay

## 2024-04-07 ENCOUNTER — Emergency Department (HOSPITAL_COMMUNITY)
Admission: EM | Admit: 2024-04-07 | Discharge: 2024-04-07 | Disposition: A | Discharge status: Home / Self Care | Attending: Emergency Medicine | Admitting: Emergency Medicine

## 2024-04-07 DIAGNOSIS — W57XXXA Bitten or stung by nonvenomous insect and other nonvenomous arthropods, initial encounter: Secondary | ICD-10-CM | POA: Insufficient documentation

## 2024-04-07 DIAGNOSIS — S50861A Insect bite (nonvenomous) of right forearm, initial encounter: Secondary | ICD-10-CM | POA: Diagnosis present

## 2024-04-07 NOTE — ED Provider Notes (Signed)
 [name] AT [facility] Provider Note   CSN: [id number] Arrival date & time: 04/07/24  1543     Patient presents with: Insect Bite   [name] is a 78 y.o. female.  She presents the ER today for evaluation of right forearm insect bite.  States she woke up this morning and noticed that there was a small mark on her arm.  Showed her family and they are worried wanted her to be evaluated in the ER because they thought it was a spider bite.  She does not know what bit her, she notes that the area is very slightly sore to the touch but she has no pain, no fever, no drainage, no numbness or tingling.  She also notes that her left ankle has eczema on it and every time she stopped taking her steroid it comes back.   HPI     Prior to Admission medications   Medication Sig Start Date End Date Taking? Authorizing Provider  acetaminophen  (TYLENOL ) 325 MG tablet Take 325 mg by mouth every 6 (six) hours as needed for mild pain. 08/31/17   [provider]  albuterol  (PROVENTIL  HFA;VENTOLIN  HFA) 108 (90 Base) MCG/ACT inhaler Inhale 2 puffs into the lungs every 4 (four) hours as needed for wheezing or shortness of breath. 02/12/18   Joyice Sauer, DO  Budeson-Glycopyrrol-Formoterol  (BREZTRI AEROSPHERE) 160-9-4.8 MCG/ACT AERO Inhale 2 puffs into the lungs 2 (two) times daily.    [provider]  Calcium  Carb-Cholecalciferol (CALCIUM  + VITAMIN D3 PO) Take 1 tablet by mouth daily.    [provider]  cilostazol (PLETAL) 100 MG tablet Take 100 mg by mouth 2 (two) times daily.  03/29/15   [provider]  cyclobenzaprine  (FLEXERIL ) 5 MG tablet Take 5 mg by mouth at bedtime. 05/31/18   [provider]  DULoxetine  (CYMBALTA ) 60 MG capsule Take 60 mg by mouth 2 (two) times daily.     [provider]  escitalopram  (LEXAPRO ) 10 MG tablet Take 10 mg by mouth daily. 01/30/17   [provider]  ezetimibe  (ZETIA ) 10 MG  tablet Take 10 mg by mouth daily. 06/11/23   [provider]  fluticasone  (FLONASE) 50 MCG/ACT nasal spray Place 2 sprays into both nostrils daily as needed for allergies. 01/13/23   [provider]  hydrOXYzine  (ATARAX /VISTARIL ) 25 MG tablet Take 1 tablet by mouth as needed for itching. 04/21/16   [provider]  LORazepam  (ATIVAN ) 1 MG tablet Take 1 mg by mouth 4 (four) times daily.    [provider]  lovastatin (MEVACOR) 10 MG tablet Take 10 mg by mouth daily. 06/23/23   [provider]  lubiprostone  (AMITIZA ) 24 MCG capsule TAKE 1 CAPSULE BY MOUTH TWICE DAILY WITH A MEAL. 09/09/23   Rourk, Lamar HERO, MD  megestrol  (MEGACE ) 40 MG/ML suspension Take 400 mg by mouth daily. Patient not taking: Reported on 08/06/2023 07/21/23   [provider]  mupirocin  cream (BACTROBAN ) 2 % Apply 1 Application topically 2 (two) times daily. Patient taking differently: Apply 1 Application topically 2 (two) times daily as needed (eczema). 06/15/22   Cleotilde Rogue, MD  ondansetron  (ZOFRAN -ODT) 4 MG disintegrating tablet Take 4 mg by mouth daily. 07/21/23   [provider]  oxyCODONE -acetaminophen  (PERCOCET) 10-325 MG tablet Take 1 tablet by mouth every 6 (six) hours as needed for pain. 12/27/20   [provider]  Pancrelipase , Lip-Prot-Amyl, (CREON ) 24000-76000 units CPEP TAKE 2 CAPSULES WITH EACH MEALS AND 1  CAPSULE WITH SNACKS. MAX 2 SNACKS PER DAY. 11/19/23   Shaaron Lamar HERO, MD  pantoprazole  (PROTONIX ) 40 MG tablet Take 1 tablet (40 mg total) by mouth 2 (two) times daily before a meal. 07/16/21   Shirlean Therisa ORN, NP  potassium chloride  (KLOR-CON ) 10 MEQ tablet Take 10 mEq by mouth daily. Patient not taking: Reported on 08/06/2023 06/23/23   [provider]  PROLIA 60 MG/ML SOSY injection Inject 60 mg into the skin every 6 (six) months.  05/31/18   [provider]  promethazine  (PHENERGAN ) 25 MG tablet Take 25 mg by mouth daily as needed  for nausea or vomiting.    [provider]  triamcinolone  cream (KENALOG ) 0.1 % Apply 1 Application topically 2 (two) times daily. 07/03/23   Cleotilde Rogue, MD    Allergies: Motrin [ibuprofen], Iohexol , Ivp dye [iodinated contrast media], and Strawberry (diagnostic)    Review of Systems  Updated Vital Signs BP 119/74   Pulse 87   Temp 98 F (36.7 C) (Oral)   Resp 16   Ht 5' 4 (1.626 m)   Wt 32.7 kg   SpO2 97%   BMI 12.36 kg/m   Physical Exam Vitals and nursing note reviewed.  Constitutional:      General: She is not in acute distress.    Appearance: She is well-developed.  HENT:     Head: Normocephalic and atraumatic.  Eyes:     Conjunctiva/sclera: Conjunctivae normal.  Cardiovascular:     Rate and Rhythm: Normal rate and regular rhythm.     Heart sounds: No murmur heard. Pulmonary:     Effort: Pulmonary effort is normal. No respiratory distress.     Breath sounds: Normal breath sounds.  Abdominal:     Palpations: Abdomen is soft.     Tenderness: There is no abdominal tenderness.  Musculoskeletal:        General: No swelling.     Cervical back: Neck supple.  Skin:    General: Skin is warm and dry.     Capillary Refill: Capillary refill takes less than 2 seconds.     Comments: Tiny abrasion to right forearm with some surrounding ecchymosis  Neurological:     General: No focal deficit present.     Mental Status: She is alert and oriented to person, place, and time.  Psychiatric:        Mood and Affect: Mood normal.     (all labs ordered are listed, but only abnormal results are displayed) Labs Reviewed - No data to display  EKG: None  Radiology: No results found.   Procedures   Medications Ordered in the ED - No data to display                                  Medical Decision Making Differential diagnosis includes but not limited to cellulitis, abscess, insect bite, contusion, other  ED course: Patient has a small area of ecchymosis  with a central area that looks like a small abrasion or puncture the right forearm on the volar aspect that she woke up with, she thinks she was bitten by something.  Family was worried was a spider bite and wanted her to be evaluated.  She does take blood thinners.  Denies any pain or itching at the site.  There is no necrosis, no red streaking, no erythema or warmth.  I discussed with the patient I do not feel  that this needs any acute treatment.  The ecchymosis is likely due to the fact that she is on Pletal which is an antiplatelet medication which can cause easy bruising.  She has no other abnormal bleeding or bruising.  She was reassured and advised on follow-up and return precautions.  She also has a small area of eczema on her ankle, she follows with her PCP for this, she is wondering why every time she stops using hydrocortisone  it flares back up.  Discussed with her that she should continue using emollients, topical steroids as directed by her PCP and follow-up with her PCP for recheck.  There is no surrounding cellulitis, no blistering, this is been ongoing for an extended period of time.        Final diagnoses:  Insect bite of right forearm, initial encounter    ED Discharge Orders     None          Suellen Sherran LABOR, PA-C 04/07/24 1841    Franklyn Sid SAILOR, MD 04/07/24 [phone]

## 2024-04-07 NOTE — ED Triage Notes (Signed)
 Reports got bite by something on right arm and unsure what it is. Also wants her foot checked because she has eczema and what the doc gave her is not working.

## 2024-04-07 NOTE — Discharge Instructions (Signed)
 No sign of infection of your skin, there is a little bit of bruising likely because you are on blood thinners.  Since you are not having pain or itching at the site we do not need to do any further testing or treatment.  Keep the area clean and dry, come back if you have redness, swelling, drainage or fever or any other worrisome changes.  Please talk to your doctor about the eczema on your ankle.

## 2024-07-26 ENCOUNTER — Observation Stay (HOSPITAL_COMMUNITY)
Admission: EM | Admit: 2024-07-26 | Discharge: 2024-07-28 | Disposition: A | Discharge status: Home / Self Care | Attending: Family Medicine | Admitting: Family Medicine

## 2024-07-26 ENCOUNTER — Other Ambulatory Visit: Payer: Self-pay

## 2024-07-26 ENCOUNTER — Emergency Department (HOSPITAL_COMMUNITY)

## 2024-07-26 ENCOUNTER — Encounter (HOSPITAL_COMMUNITY): Payer: Self-pay

## 2024-07-26 DIAGNOSIS — K219 Gastro-esophageal reflux disease without esophagitis: Secondary | ICD-10-CM | POA: Diagnosis not present

## 2024-07-26 DIAGNOSIS — J441 Chronic obstructive pulmonary disease with (acute) exacerbation: Principal | ICD-10-CM | POA: Insufficient documentation

## 2024-07-26 DIAGNOSIS — J9621 Acute and chronic respiratory failure with hypoxia: Secondary | ICD-10-CM | POA: Diagnosis not present

## 2024-07-26 DIAGNOSIS — J449 Chronic obstructive pulmonary disease, unspecified: Secondary | ICD-10-CM

## 2024-07-26 DIAGNOSIS — K861 Other chronic pancreatitis: Secondary | ICD-10-CM | POA: Insufficient documentation

## 2024-07-26 DIAGNOSIS — E782 Mixed hyperlipidemia: Secondary | ICD-10-CM | POA: Insufficient documentation

## 2024-07-26 DIAGNOSIS — Z87891 Personal history of nicotine dependence: Secondary | ICD-10-CM | POA: Insufficient documentation

## 2024-07-26 DIAGNOSIS — E876 Hypokalemia: Secondary | ICD-10-CM | POA: Diagnosis not present

## 2024-07-26 DIAGNOSIS — R634 Abnormal weight loss: Secondary | ICD-10-CM

## 2024-07-26 DIAGNOSIS — I2510 Atherosclerotic heart disease of native coronary artery without angina pectoris: Secondary | ICD-10-CM | POA: Insufficient documentation

## 2024-07-26 DIAGNOSIS — M549 Dorsalgia, unspecified: Secondary | ICD-10-CM | POA: Insufficient documentation

## 2024-07-26 LAB — CBC WITH DIFFERENTIAL/PLATELET
Abs Immature Granulocytes: 0.05 K/uL (ref 0.00–0.07)
Basophils Absolute: 0.1 K/uL (ref 0.0–0.1)
Basophils Relative: 0 %
Eosinophils Absolute: 0.2 K/uL (ref 0.0–0.5)
Eosinophils Relative: 1 %
HCT: 41.7 % (ref 36.0–46.0)
Hemoglobin: 14.1 g/dL (ref 12.0–15.0)
Immature Granulocytes: 0 %
Lymphocytes Relative: 16 %
Lymphs Abs: 2.3 K/uL (ref 0.7–4.0)
MCH: 31.6 pg (ref 26.0–34.0)
MCHC: 33.8 g/dL (ref 30.0–36.0)
MCV: 93.5 fL (ref 80.0–100.0)
Monocytes Absolute: 1.8 K/uL — ABNORMAL HIGH (ref 0.1–1.0)
Monocytes Relative: 12 %
Neutro Abs: 10.4 K/uL — ABNORMAL HIGH (ref 1.7–7.7)
Neutrophils Relative %: 71 %
Platelets: 362 K/uL (ref 150–400)
RBC: 4.46 MIL/uL (ref 3.87–5.11)
RDW: 14 % (ref 11.5–15.5)
WBC: 14.7 K/uL — ABNORMAL HIGH (ref 4.0–10.5)
nRBC: 0 % (ref 0.0–0.2)

## 2024-07-26 LAB — COMPREHENSIVE METABOLIC PANEL WITH GFR
ALT: <5 U/L (ref 0–44)
AST: 19 U/L (ref 15–41)
Albumin: 4.5 g/dL (ref 3.5–5.0)
Alkaline Phosphatase: 78 U/L (ref 38–126)
Anion gap: 11 (ref 5–15)
BUN: 7 mg/dL — ABNORMAL LOW (ref 8–23)
CO2: 29 mmol/L (ref 22–32)
Calcium: 8.9 mg/dL (ref 8.9–10.3)
Chloride: 100 mmol/L (ref 98–111)
Creatinine, Ser: 0.47 mg/dL (ref 0.44–1.00)
GFR, Estimated: >60 mL/min (ref >60)
Glucose, Bld: 78 mg/dL (ref 70–99)
Potassium: 3.1 mmol/L — ABNORMAL LOW (ref 3.5–5.1)
Sodium: 140 mmol/L (ref 135–145)
Total Bilirubin: 0.3 mg/dL (ref 0.0–1.2)
Total Protein: 7.3 g/dL (ref 6.5–8.1)

## 2024-07-26 LAB — TROPONIN T, HIGH SENSITIVITY
Troponin T High Sensitivity: <15 ng/L (ref 0–19)
Troponin T High Sensitivity: <15 ng/L (ref 0–19)

## 2024-07-26 LAB — RESP PANEL BY RT-PCR (RSV, FLU A&B, COVID)  RVPGX2
Influenza A by PCR: NEGATIVE
Influenza B by PCR: NEGATIVE
Resp Syncytial Virus by PCR: NEGATIVE
SARS Coronavirus 2 by RT PCR: NEGATIVE

## 2024-07-26 MED ORDER — BUDESON-GLYCOPYRROL-FORMOTEROL 160-9-4.8 MCG/ACT IN AERO
2.0000 | INHALATION_SPRAY | Freq: Two times a day (BID) | RESPIRATORY_TRACT | Status: DC
  Filled 2024-07-26: qty 5.9

## 2024-07-26 MED ORDER — OXYCODONE-ACETAMINOPHEN 5-325 MG PO TABS
1.0000 | ORAL_TABLET | Freq: Once | ORAL | Status: AC
  Administered 2024-07-26: 1 via ORAL
  Filled 2024-07-26: qty 1

## 2024-07-26 MED ORDER — OXYCODONE HCL 5 MG PO TABS
5.0000 mg | ORAL_TABLET | Freq: Four times a day (QID) | ORAL | Status: DC | PRN
  Administered 2024-07-26 – 2024-07-28 (×5): 5 mg via ORAL
  Filled 2024-07-26 (×5): qty 1

## 2024-07-26 MED ORDER — METHYLPREDNISOLONE SODIUM SUCC 125 MG IJ SOLR
80.0000 mg | Freq: Once | INTRAMUSCULAR | Status: AC
  Administered 2024-07-26: 80 mg via INTRAVENOUS
  Filled 2024-07-26: qty 2

## 2024-07-26 MED ORDER — PANCRELIPASE (LIP-PROT-AMYL) 12000-38000 UNITS PO CPEP
24000.0000 [IU] | ORAL_CAPSULE | Freq: Three times a day (TID) | ORAL | Status: DC
  Administered 2024-07-27 – 2024-07-28 (×5): 24000 [IU] via ORAL
  Filled 2024-07-26 (×8): qty 2

## 2024-07-26 MED ORDER — EZETIMIBE 10 MG PO TABS
10.0000 mg | ORAL_TABLET | Freq: Every day | ORAL | Status: DC
  Administered 2024-07-27 – 2024-07-28 (×2): 10 mg via ORAL
  Filled 2024-07-26 (×2): qty 1

## 2024-07-26 MED ORDER — OXYCODONE-ACETAMINOPHEN 10-325 MG PO TABS: 1.0000 | ORAL_TABLET | Freq: Four times a day (QID) | ORAL | Status: DC | PRN

## 2024-07-26 MED ORDER — POTASSIUM CHLORIDE CRYS ER 20 MEQ PO TBCR
40.0000 meq | EXTENDED_RELEASE_TABLET | Freq: Once | ORAL | Status: AC
  Administered 2024-07-26: 40 meq via ORAL
  Filled 2024-07-26: qty 2

## 2024-07-26 MED ORDER — IPRATROPIUM-ALBUTEROL 0.5-2.5 (3) MG/3ML IN SOLN
3.0000 mL | Freq: Once | RESPIRATORY_TRACT | Status: AC
  Administered 2024-07-26: 3 mL via RESPIRATORY_TRACT
  Filled 2024-07-26: qty 3

## 2024-07-26 MED ORDER — PANTOPRAZOLE SODIUM 40 MG PO TBEC
40.0000 mg | DELAYED_RELEASE_TABLET | Freq: Two times a day (BID) | ORAL | Status: DC
  Administered 2024-07-27 – 2024-07-28 (×3): 40 mg via ORAL
  Filled 2024-07-26 (×3): qty 1

## 2024-07-26 MED ORDER — PRAVASTATIN SODIUM 10 MG PO TABS
10.0000 mg | ORAL_TABLET | Freq: Every day | ORAL | Status: DC
  Administered 2024-07-27: 10 mg via ORAL
  Filled 2024-07-26: qty 1

## 2024-07-26 MED ORDER — OXYCODONE-ACETAMINOPHEN 5-325 MG PO TABS
1.0000 | ORAL_TABLET | Freq: Four times a day (QID) | ORAL | Status: DC | PRN
  Administered 2024-07-26 – 2024-07-28 (×6): 1 via ORAL
  Filled 2024-07-26 (×6): qty 1

## 2024-07-26 NOTE — ED Triage Notes (Signed)
 Pt arrived via POV from home with family for concern of on-going weakness, cough and headache X 6 days. Pt reports OTC medications are not helping. Pt reports she uses oxygen as needed. Pts O2 Sats observed 88% on room air in Triage. Pt tachycardic as well.

## 2024-07-26 NOTE — H&P (Signed)
 History and Physical    Patient: [name] DOB: 12-06-1945 DOA: 07/26/2024 DOS: the patient was seen and examined on 07/26/2024 PCP: Shona Norleen PEDLAR, MD  Patient coming from: Home  Chief Complaint:  Chief Complaint  Patient presents with   Cough   HPI: [name] is a 78 y.o. female with medical history significant of hyperlipidemia, GERD, COPD on home oxygen as needed,chronic pancreatitis, anxiety, depression, chronic back pain, CAD, cervical cancer, CVA, tobacco abuse who presents to the emergency department due to about 1 week onset of cough which was initially nonproductive, but became productive after using Mucinex  with an initial production of green phlegm which has since changed to white phlegm, this was associated with generalized weakness and chills without fever.  She states that she has been depending on her home oxygen more frequently since onset of symptoms.  She continues to smoke.  ED Course: In emergency department, respiratory rate was 20/min, pulse 111 bpm, BP 144/89, O2 sat 97%, temperature 98.5 F.  Workup in the ED showed normal CBC except for WBC of 14.7.  BMP was normal except for potassium of 3.1.  Troponin x 2 - < 15.  Influenza A, B, SARS coronavirus 2, RSV was negative. Chest x-ray showed no active disease EKG showed sinus tachycardia at rate of 109 bpm IV Solu-Medrol  80 mg x 1, Percocet was given and potassium was replenished.  TR[facility] was asked to admit patient   Review of Systems: As mentioned in the history of present illness. All other systems reviewed and are negative.  Past Medical History:  Diagnosis Date   Allergic rhinitis    Anxiety    Anxiety and depression    Back pain, chronic    CAD (coronary artery disease)    palpitations, dizziness, chest pain   Cervical cancer (HCC)    cervical   Chronic abdominal pain    with chronic nausea, diarrhea   Collagen vascular disease    COPD (chronic obstructive pulmonary disease)  (HCC)    Depression    GERD (gastroesophageal reflux disease)    Helicobacter pylori infection 2015   treated with prevpac   Hematochezia    History of kidney stones    Hx of colonic polyps    adenomatous   Hyperlipidemia    Lipid profile on 02/25/2011: 209, 113, 55, 132   Nephrolithiasis    Pancreatitis chronic    Renal calculus    Stroke (HCC) 3-4 yrs ago   left sided weakness   Tobacco abuse    Tubular adenoma 2015   Weight loss    CT negative for occult malignancy, negative celiac, negative adrenal insufficiency   Past Surgical History:  Procedure Laterality Date   ABDOMINAL HYSTERECTOMY     APPENDECTOMY     CATARACT EXTRACTION W/PHACO Right 05/04/2013   Procedure: CATARACT EXTRACTION PHACO AND INTRAOCULAR LENS PLACEMENT (IOC);  Surgeon: Cherene Mania, MD;  Location: [facility];  Service: Ophthalmology;  Laterality: Right;  CDE:16.17   CATARACT EXTRACTION W/PHACO Left 05/22/2013   Procedure: CATARACT EXTRACTION PHACO AND INTRAOCULAR LENS PLACEMENT (IOC);  Surgeon: Cherene Mania, MD;  Location: [facility];  Service: Ophthalmology;  Laterality: Left;  CDE:14.75   COLONOSCOPY N/A 02/08/2014   Dr.Rourk- normal rectum, colonic polyps bx=tubular adenoma   COLONOSCOPY W/ POLYPECTOMY  2006   MFM:yzfnmmynpid, benign gastric nodule, multiple adenomatous polyps, one with tubular morphology   COLONOSCOPY WITH PROPOFOL  N/A 04/15/2017   normal   ESOPHAGOGASTRODUODENOSCOPY  2006   MFM:azwphw gastric  nodule   ESOPHAGOGASTRODUODENOSCOPY N/A 02/08/2014   Dr.Rourk- abnormal stomach and gstric nodule bx= hpylori   EUS  2010   Dr. Teressa : Multiple shadowing calcifications in pancreas, consistent with Chronic Pancreatitis.  These are mainly confined to a collection of calcifications in head/uncinate pancreas. Otherwise the pancreatic parenchyma appears fairly normal.  The main pancreatic duct, CBD are both normal without stones, dilation.     Ileocolonoscopy  01/11/2009   RMR: Polyp at the splenic flexure,  status post hot snare removal/ Normal rectum, tubular adenoma   PARTIAL HYSTERECTOMY     Social History:  reports that she has been smoking cigarettes. She started smoking about 62 years ago. She has a 46.5 pack-year smoking history. She has been exposed to tobacco smoke. She has never used smokeless tobacco. She reports that she does not drink alcohol and does not use drugs.  Allergies  Allergen Reactions   Motrin [Ibuprofen] Anaphylaxis and Hives   Iohexol  Hives and Swelling    Requires pre-meds    Ivp Dye [Iodinated Contrast Media] Hives and Swelling    Requires pre-meds   Strawberry (Diagnostic) Itching    Family History  Problem Relation Age of Onset   Heart attack Father 61   Colon cancer Maternal Grandmother     Prior to Admission medications   Medication Sig Start Date End Date Taking? Authorizing Provider  acetaminophen  (TYLENOL ) 325 MG tablet Take 325 mg by mouth every 6 (six) hours as needed for mild pain. 08/31/17   [provider]  albuterol  (PROVENTIL  HFA;VENTOLIN  HFA) 108 (90 Base) MCG/ACT inhaler Inhale 2 puffs into the lungs every 4 (four) hours as needed for wheezing or shortness of breath. 02/12/18   Joyice Sauer, DO  Budeson-Glycopyrrol-Formoterol  (BREZTRI AEROSPHERE) 160-9-4.8 MCG/ACT AERO Inhale 2 puffs into the lungs 2 (two) times daily.    [provider]  Calcium  Carb-Cholecalciferol (CALCIUM  + VITAMIN D3 PO) Take 1 tablet by mouth daily.    [provider]  cilostazol (PLETAL) 100 MG tablet Take 100 mg by mouth 2 (two) times daily.  03/29/15   [provider]  cyclobenzaprine  (FLEXERIL ) 5 MG tablet Take 5 mg by mouth at bedtime. 05/31/18   [provider]  DULoxetine  (CYMBALTA ) 60 MG capsule Take 60 mg by mouth 2 (two) times daily.     [provider]  escitalopram  (LEXAPRO ) 10 MG tablet Take 10 mg by mouth daily. 01/30/17   [provider]  ezetimibe  (ZETIA ) 10 MG tablet Take 10 mg by mouth daily.  06/11/23   [provider]  fluticasone  (FLONASE) 50 MCG/ACT nasal spray Place 2 sprays into both nostrils daily as needed for allergies. 01/13/23   [provider]  hydrOXYzine  (ATARAX /VISTARIL ) 25 MG tablet Take 1 tablet by mouth as needed for itching. 04/21/16   [provider]  LORazepam  (ATIVAN ) 1 MG tablet Take 1 mg by mouth 4 (four) times daily.    [provider]  lovastatin (MEVACOR) 10 MG tablet Take 10 mg by mouth daily. 06/23/23   [provider]  lubiprostone  (AMITIZA ) 24 MCG capsule TAKE 1 CAPSULE BY MOUTH TWICE DAILY WITH A MEAL. 09/09/23   Rourk, Lamar HERO, MD  megestrol  (MEGACE ) 40 MG/ML suspension Take 400 mg by mouth daily. Patient not taking: Reported on 08/06/2023 07/21/23   [provider]  mupirocin  cream (BACTROBAN ) 2 % Apply 1 Application topically 2 (two) times daily. Patient taking differently: Apply 1 Application topically 2 (two) times daily as needed (eczema). 06/15/22  Cleotilde Rogue, MD  ondansetron  (ZOFRAN -ODT) 4 MG disintegrating tablet Take 4 mg by mouth daily. 07/21/23   [provider]  oxyCODONE -acetaminophen  (PERCOCET) 10-325 MG tablet Take 1 tablet by mouth every 6 (six) hours as needed for pain. 12/27/20   [provider]  Pancrelipase , Lip-Prot-Amyl, (CREON ) 24000-76000 units CPEP TAKE 2 CAPSULES WITH EACH MEALS AND 1 CAPSULE WITH SNACKS. MAX 2 SNACKS PER DAY. 11/19/23   Shaaron Lamar HERO, MD  pantoprazole  (PROTONIX ) 40 MG tablet Take 1 tablet (40 mg total) by mouth 2 (two) times daily before a meal. 07/16/21   Shirlean Therisa ORN, NP  potassium chloride  (KLOR-CON ) 10 MEQ tablet Take 10 mEq by mouth daily. Patient not taking: Reported on 08/06/2023 06/23/23   [provider]  PROLIA 60 MG/ML SOSY injection Inject 60 mg into the skin every 6 (six) months.  05/31/18   [provider]  promethazine  (PHENERGAN ) 25 MG tablet Take 25 mg by mouth daily as needed for nausea or vomiting.    [provider]  triamcinolone  cream (KENALOG ) 0.1 % Apply 1 Application topically 2 (two) times daily. 07/03/23   Cleotilde Rogue, MD    Physical Exam: Vitals:   07/26/24 2052 07/26/24 2100 07/26/24 2115 07/26/24 2126  BP:  119/71    Pulse: (!) 106 98 (!) 108 (!) 108  Resp: 17 (!) 23 20 18   Temp:      TempSrc:      SpO2: (!) 89% 91% 91% 90%  Weight:      Height:       General: Elderly female. Awake and alert and oriented x3. Not in any acute distress.  HEENT: NCAT.  PERRLA. EOMI. Sclerae anicteric.  Moist mucosal membranes. Neck: Neck supple without lymphadenopathy. No carotid bruits. No masses palpated.  Cardiovascular: Regular rate with normal S1-S2 sounds. No murmurs, rubs or gallops auscultated. No JVD.  Respiratory: Mild scattered wheezes with diffuse rhonchi on auscultation.   Abdomen: Soft, nontender, nondistended. Active bowel sounds. No masses or hepatosplenomegaly  Skin: No rashes, lesions, or ulcerations.  Dry, warm to touch. Musculoskeletal:  2+ dorsalis pedis and radial pulses. Good ROM.  No contractures  Psychiatric: Intact judgment and insight.  Mood appropriate to current condition. Neurologic: No focal neurological deficits. Strength is 5/5 x 4.  CN II - XII grossly intact.  Data Reviewed: Sinus tachycardia at a rate of 109 bpm  Assessment and Plan: Acute exacerbation of COPD Acute on chronic respiratory failure with hypoxia Continue Xopenex,Atrovent , Mucinex , Breztri, Solu-Medrol , azithromycin. Continue Protonix  to prevent steroid-induced ulcer Continue incentive spirometry and flutter valve Continue supplemental oxygen to maintain O2 sat > 92% with plan to wean patient off oxygen as tolerated  Hypokalemia K+ 3.1, this was replenished  GERD Continue Protonix   Chronic back pain Continue Percocet per home regimen  Mixed hyperlipidemia Continue Pravachol , Zetia   Chronic pancreatitis Continue Creon    Advance Care Planning: Full code  Consults:  None  Family Communication: None at bedside  Severity of Illness: The appropriate patient status for this patient is OBSERVATION. Observation status is judged to be reasonable and necessary in order to provide the required intensity of service to ensure the patient's safety. The patient's presenting symptoms, physical exam findings, and initial radiographic and laboratory data in the context of their medical condition is felt to place them at decreased risk for further clinical deterioration. Furthermore, it is anticipated that the patient will be medically stable for discharge from the hospital within 2 midnights of admission.  Author: Jeanmarc Viernes, DO 07/26/2024 9:50 PM  For on call review [email or web address].

## 2024-07-26 NOTE — ED Provider Notes (Signed)
 [place] EMERGENCY DEPARTMENT AT [facility] Provider Note   CSN: [id number] Arrival date & time: 07/26/24  1652     Patient presents with: Cough   TRINIKA CORTESE is a 78 y.o. female.    Cough Associated symptoms: chills and shortness of breath   Associated symptoms: no chest pain and no fever        ARBELL WYCOFF is a 78 y.o. female past medical history of coronary artery disease, GERD, COPD, chronic back pain, chronic pancreatitis, who presents to the Emergency Department complaining of cough, generalized weakness, decreased appetite for approximately 1 week.  States she has used over-the-counter medications for her cough.  Describes her cough as initially nonproductive but states her cough loosened up after taking Mucinex .  Reported chills at home but no known fever.  Has maintenance inhaler and oxygen at home uses her oxygen as needed.  Continues to smoke.  Prior to Admission medications   Medication Sig Start Date End Date Taking? Authorizing Provider  acetaminophen  (TYLENOL ) 325 MG tablet Take 325 mg by mouth every 6 (six) hours as needed for mild pain. 08/31/17   [provider]  albuterol  (PROVENTIL  HFA;VENTOLIN  HFA) 108 (90 Base) MCG/ACT inhaler Inhale 2 puffs into the lungs every 4 (four) hours as needed for wheezing or shortness of breath. 02/12/18   Joyice Sauer, DO  Budeson-Glycopyrrol-Formoterol  (BREZTRI AEROSPHERE) 160-9-4.8 MCG/ACT AERO Inhale 2 puffs into the lungs 2 (two) times daily.    [provider]  Calcium  Carb-Cholecalciferol (CALCIUM  + VITAMIN D3 PO) Take 1 tablet by mouth daily.    [provider]  cilostazol (PLETAL) 100 MG tablet Take 100 mg by mouth 2 (two) times daily.  03/29/15   [provider]  cyclobenzaprine  (FLEXERIL ) 5 MG tablet Take 5 mg by mouth at bedtime. 05/31/18   [provider]  DULoxetine  (CYMBALTA ) 60 MG capsule Take 60 mg by mouth 2 (two) times daily.     [provider]  escitalopram  (LEXAPRO ) 10 MG tablet Take 10 mg by mouth daily. 01/30/17   [provider]  ezetimibe  (ZETIA ) 10 MG tablet Take 10 mg by mouth daily. 06/11/23   [provider]  fluticasone  (FLONASE) 50 MCG/ACT nasal spray Place 2 sprays into both nostrils daily as needed for allergies. 01/13/23   [provider]  hydrOXYzine  (ATARAX /VISTARIL ) 25 MG tablet Take 1 tablet by mouth as needed for itching. 04/21/16   [provider]  LORazepam  (ATIVAN ) 1 MG tablet Take 1 mg by mouth 4 (four) times daily.    [provider]  lovastatin (MEVACOR) 10 MG tablet Take 10 mg by mouth daily. 06/23/23   [provider]  lubiprostone  (AMITIZA ) 24 MCG capsule TAKE 1 CAPSULE BY MOUTH TWICE DAILY WITH A MEAL. 09/09/23   Rourk, Lamar HERO, MD  megestrol  (MEGACE ) 40 MG/ML suspension Take 400 mg by mouth daily. Patient not taking: Reported on 08/06/2023 07/21/23   [provider]  mupirocin  cream (BACTROBAN ) 2 % Apply 1 Application topically 2 (two) times daily. Patient taking differently: Apply 1 Application topically 2 (two) times daily as needed (eczema). 06/15/22   Cleotilde Rogue, MD  ondansetron  (ZOFRAN -ODT) 4 MG disintegrating tablet Take 4 mg by mouth daily. 07/21/23   [provider]  oxyCODONE -acetaminophen  (PERCOCET) 10-325 MG tablet Take 1 tablet by mouth every 6 (six) hours as needed for pain. 12/27/20   [provider]  Pancrelipase , Lip-Prot-Amyl, (CREON ) 24000-76000 units CPEP TAKE 2 CAPSULES WITH EACH MEALS AND  1 CAPSULE WITH SNACKS. MAX 2 SNACKS PER DAY. 11/19/23   Rourk, Lamar HERO, MD  pantoprazole  (PROTONIX ) 40 MG tablet Take 1 tablet (40 mg total) by mouth 2 (two) times daily before a meal. 07/16/21   Shirlean Therisa ORN, NP  potassium chloride  (KLOR-CON ) 10 MEQ tablet Take 10 mEq by mouth daily. Patient not taking: Reported on 08/06/2023 06/23/23   [provider]  PROLIA 60 MG/ML SOSY injection Inject 60 mg into the  skin every 6 (six) months.  05/31/18   [provider]  promethazine  (PHENERGAN ) 25 MG tablet Take 25 mg by mouth daily as needed for nausea or vomiting.    [provider]  triamcinolone  cream (KENALOG ) 0.1 % Apply 1 Application topically 2 (two) times daily. 07/03/23   Cleotilde Rogue, MD    Allergies: Motrin [ibuprofen], Iohexol , Ivp dye [iodinated contrast media], and Strawberry (diagnostic)    Review of Systems  Constitutional:  Positive for chills. Negative for fever.  HENT:  Negative for congestion.   Respiratory:  Positive for cough and shortness of breath.   Cardiovascular:  Negative for chest pain and leg swelling.  Gastrointestinal:  Negative for abdominal pain, diarrhea, nausea and vomiting.  Neurological:  Positive for weakness. Negative for dizziness and numbness.    Updated Vital Signs BP 119/71   Pulse 98   Temp 98.5 F (36.9 C) (Oral)   Resp (!) 23   Ht 5' 4 (1.626 m)   Wt 33.1 kg   SpO2 91%   BMI 12.53 kg/m   Physical Exam Vitals and nursing note reviewed.  Constitutional:      General: She is not in acute distress.    Appearance: Normal appearance. She is not ill-appearing or toxic-appearing.  HENT:     Mouth/Throat:     Mouth: Mucous membranes are moist.  Cardiovascular:     Rate and Rhythm: Normal rate and regular rhythm.     Pulses: Normal pulses.  Pulmonary:     Effort: Pulmonary effort is normal.     Breath sounds: Wheezing and rhonchi present.     Comments: Patient on 2 L O2 during my exam.  Scattered rhonchi with some expiratory wheezes.  No increased work of breathing. Abdominal:     Palpations: Abdomen is soft.     Tenderness: There is no abdominal tenderness.  Musculoskeletal:     Right lower leg: No edema.     Left lower leg: No edema.  Skin:    General: Skin is warm.     Capillary Refill: Capillary refill takes less than 2 seconds.  Neurological:     General: No focal deficit present.     Mental Status: She is alert.      (all labs ordered are listed, but only abnormal results are displayed) Labs Reviewed  CBC WITH DIFFERENTIAL/PLATELET - Abnormal; Notable for the following components:      Result Value   WBC 14.7 (*)    Neutro Abs 10.4 (*)    Monocytes Absolute 1.8 (*)    All other components within normal limits  COMPREHENSIVE METABOLIC PANEL WITH GFR - Abnormal; Notable for the following components:   Potassium 3.1 (*)    BUN 7 (*)    All other components within normal limits  RESP PANEL BY RT-PCR (RSV, FLU A&B, COVID)  [id number]  TROPONIN T, HIGH SENSITIVITY  TROPONIN T, HIGH SENSITIVITY    EKG: None  Radiology: [facility] Chest Port 1 View Result Date: 07/26/2024 CLINICAL DATA:  Shortness  of breath. EXAM: PORTABLE CHEST 1 VIEW COMPARISON:  Chest radiograph dated 11/02/2023. FINDINGS: Background of emphysema. No focal consolidation, pleural effusion or pneumothorax. The cardiac silhouette is within normal limits. No acute osseous pathology. IMPRESSION: No active disease. Electronically Signed   By: Vanetta Chou M.D.   On: 07/26/2024 17:49     Procedures   Medications Ordered in the ED  ipratropium-albuterol  (DUONEB) 0.5-2.5 (3) MG/3ML nebulizer solution 3 mL (3 mLs Nebulization Given 07/26/24 1920)  methylPREDNISolone  sodium succinate (SOLU-MEDROL ) 125 mg/2 mL injection 80 mg (80 mg Intravenous Given 07/26/24 1925)  oxyCODONE -acetaminophen  (PERCOCET/ROXICET) 5-325 MG per tablet 1 tablet (1 tablet Oral Given 07/26/24 1930)  potassium chloride  SA (KLOR-CON  M) CR tablet 40 mEq (40 mEq Oral Given 07/26/24 1925)  ipratropium-albuterol  (DUONEB) 0.5-2.5 (3) MG/3ML nebulizer solution 3 mL (3 mLs Nebulization Given 07/26/24 2035)                                    Medical Decision Making Patient here with cough, shortness of breath decreased appetite and generalized weakness for 6 days.  No relief with over-the-counter medications.  History of COPD has home oxygen that she uses as needed.  Reports  no improvement with home inhalers.   I suspect COPD exacerbation.  Patient hypoxic on arrival on room air tachycardic as well.  O2 improved after placed on 2 L.  Tachycardia has also improved.  Differential would include pneumonia, viral process.  Doubt sepsis.  Amount and/or Complexity of Data Reviewed Labs: ordered.    Details: Mild leukocytosis, chemistry show mild hypokalemia with potassium of 3.1.  Respiratory panel negative.  Troponin unremarkable. Radiology: ordered.    Details: Chest x-ray shows no acute cardiopulmonary process. ECG/medicine tests: ordered.    Details: EKG shows sinus tachycardia ST and T wave abnormalities Discussion of management or test interpretation with external provider(s):    Patient here with COPD exacerbation.  Has been given Solu-Medrol  and albuterol  nebs here with minimal improvement.  Hypoxic at rest without supplemental O2.  Feel that she would benefit from hospital admission for COPD exacerbation.  Mild hypokalemia treated here with oral potassium   Discussed with [facility] hospitalist, Dr. Adefeso who agrees to admit  Risk Prescription drug management. Decision regarding hospitalization.        Final diagnoses:  COPD exacerbation [facility])    ED Discharge Orders     None          Herlinda Milling, PA-C 07/26/24 2308    Francesca Elsie CROME, MD 07/26/24 2324

## 2024-07-26 NOTE — ED Notes (Signed)
 In to ambulate pt. Did not ambulate due to pt sats ra resting 88-89%. Will notify PA

## 2024-07-26 NOTE — H&P (Incomplete)
 History and Physical    Patient: [name] FMW:[id number] DOB: [birth year] DOA: 07/26/2024 DOS: the patient was seen and examined on 07/26/2024 PCP: Shona Norleen PEDLAR, MD  Patient coming from: Home  Chief Complaint:  Chief Complaint  Patient presents with  . Cough   HPI: [name] is a 78 y.o. female with medical history significant of hyperlipidemia, GERD, COPD,chronic pancreatitis, anxiety, depression, chronic back pain, CAD, cervical cancer, CVA, tobacco abuse who presents to the emergency department   Review of Systems: {ROS_Text:26778} Past Medical History:  Diagnosis Date  . Allergic rhinitis   . Anxiety   . Anxiety and depression   . Back pain, chronic   . CAD (coronary artery disease)    palpitations, dizziness, chest pain  . Cervical cancer (HCC)    cervical  . Chronic abdominal pain    with chronic nausea, diarrhea  . Collagen vascular disease   . COPD (chronic obstructive pulmonary disease) (HCC)   . Depression   . GERD (gastroesophageal reflux disease)   . Helicobacter pylori infection 2015   treated with prevpac  . Hematochezia   . History of kidney stones   . Hx of colonic polyps    adenomatous  . Hyperlipidemia    Lipid profile on 02/25/2011: 209, 113, 55, 132  . Nephrolithiasis   . Pancreatitis chronic   . Renal calculus   . Stroke [facility]) 3-4 yrs ago   left sided weakness  . Tobacco abuse   . Tubular adenoma 2015  . Weight loss    CT negative for occult malignancy, negative celiac, negative adrenal insufficiency   Past Surgical History:  Procedure Laterality Date  . ABDOMINAL HYSTERECTOMY    . APPENDECTOMY    . CATARACT EXTRACTION W/PHACO Right 05/04/2013   Procedure: CATARACT EXTRACTION PHACO AND INTRAOCULAR LENS PLACEMENT (IOC);  Surgeon: Cherene Mania, MD;  Location: [facility] ORS;  Service: Ophthalmology;  Laterality: Right;  CDE:16.17  . CATARACT EXTRACTION W/PHACO Left 05/22/2013   Procedure: CATARACT EXTRACTION PHACO AND INTRAOCULAR LENS  PLACEMENT (IOC);  Surgeon: Cherene Mania, MD;  Location: [facility];  Service: Ophthalmology;  Laterality: Left;  CDE:14.75  . COLONOSCOPY N/A 02/08/2014   Dr.Rourk- normal rectum, colonic polyps bx=tubular adenoma  . COLONOSCOPY W/ POLYPECTOMY  2006   MFM:yzfnmmynpid, benign gastric nodule, multiple adenomatous polyps, one with tubular morphology  . COLONOSCOPY WITH PROPOFOL  N/A 04/15/2017   normal  . ESOPHAGOGASTRODUODENOSCOPY  2006   MFM:azwphw gastric nodule  . ESOPHAGOGASTRODUODENOSCOPY N/A 02/08/2014   Dr.Rourk- abnormal stomach and gstric nodule bx= hpylori  . EUS  2010   Dr. Teressa : Multiple shadowing calcifications in pancreas, consistent with Chronic Pancreatitis.  These are mainly confined to a collection of calcifications in head/uncinate pancreas. Otherwise the pancreatic parenchyma appears fairly normal.  The main pancreatic duct, CBD are both normal without stones, dilation.    . Ileocolonoscopy  01/11/2009   RMR: Polyp at the splenic flexure, status post hot snare removal/ Normal rectum, tubular adenoma  . PARTIAL HYSTERECTOMY     Social History:  reports that she has been smoking cigarettes. She started smoking about 62 years ago. She has a 46.5 pack-year smoking history. She has been exposed to tobacco smoke. She has never used smokeless tobacco. She reports that she does not drink alcohol and does not use drugs.  Allergies  Allergen Reactions  . Motrin [Ibuprofen] Anaphylaxis and Hives  . Iohexol  Hives and Swelling    Requires pre-meds   . Ivp Dye [Iodinated Contrast Media]  Hives and Swelling    Requires pre-meds  . Strawberry (Diagnostic) Itching    Family History  Problem Relation Age of Onset  . Heart attack Father 3  . Colon cancer Maternal Grandmother     Prior to Admission medications   Medication Sig Start Date End Date Taking? Authorizing Provider  acetaminophen  (TYLENOL ) 325 MG tablet Take 325 mg by mouth every 6 (six) hours as needed for mild pain. 08/31/17    [provider]  albuterol  (PROVENTIL  HFA;VENTOLIN  HFA) 108 (90 Base) MCG/ACT inhaler Inhale 2 puffs into the lungs every 4 (four) hours as needed for wheezing or shortness of breath. 02/12/18   Joyice Sauer, DO  Budeson-Glycopyrrol-Formoterol  (BREZTRI AEROSPHERE) 160-9-4.8 MCG/ACT AERO Inhale 2 puffs into the lungs 2 (two) times daily.    [provider]  Calcium  Carb-Cholecalciferol (CALCIUM  + VITAMIN D3 PO) Take 1 tablet by mouth daily.    [provider]  cilostazol (PLETAL) 100 MG tablet Take 100 mg by mouth 2 (two) times daily.  03/29/15   [provider]  cyclobenzaprine  (FLEXERIL ) 5 MG tablet Take 5 mg by mouth at bedtime. 05/31/18   [provider]  DULoxetine  (CYMBALTA ) 60 MG capsule Take 60 mg by mouth 2 (two) times daily.     [provider]  escitalopram  (LEXAPRO ) 10 MG tablet Take 10 mg by mouth daily. 01/30/17   [provider]  ezetimibe  (ZETIA ) 10 MG tablet Take 10 mg by mouth daily. 06/11/23   [provider]  fluticasone  (FLONASE) 50 MCG/ACT nasal spray Place 2 sprays into both nostrils daily as needed for allergies. 01/13/23   [provider]  hydrOXYzine  (ATARAX /VISTARIL ) 25 MG tablet Take 1 tablet by mouth as needed for itching. 04/21/16   [provider]  LORazepam  (ATIVAN ) 1 MG tablet Take 1 mg by mouth 4 (four) times daily.    [provider]  lovastatin (MEVACOR) 10 MG tablet Take 10 mg by mouth daily. 06/23/23   [provider]  lubiprostone  (AMITIZA ) 24 MCG capsule TAKE 1 CAPSULE BY MOUTH TWICE DAILY WITH A MEAL. 09/09/23   Rourk, Lamar HERO, MD  megestrol  (MEGACE ) 40 MG/ML suspension Take 400 mg by mouth daily. Patient not taking: Reported on 08/06/2023 07/21/23   [provider]  mupirocin  cream (BACTROBAN ) 2 % Apply 1 Application topically 2 (two) times daily. Patient taking differently: Apply 1 Application topically 2 (two) times daily as needed (eczema).  06/15/22   Cleotilde Rogue, MD  ondansetron  (ZOFRAN -ODT) 4 MG disintegrating tablet Take 4 mg by mouth daily. 07/21/23   [provider]  oxyCODONE -acetaminophen  (PERCOCET) 10-325 MG tablet Take 1 tablet by mouth every 6 (six) hours as needed for pain. 12/27/20   [provider]  Pancrelipase , Lip-Prot-Amyl, (CREON ) 24000-76000 units CPEP TAKE 2 CAPSULES WITH EACH MEALS AND 1 CAPSULE WITH SNACKS. MAX 2 SNACKS PER DAY. 11/19/23   Rourk, Lamar HERO, MD  pantoprazole  (PROTONIX ) 40 MG tablet Take 1 tablet (40 mg total) by mouth 2 (two) times daily before a meal. 07/16/21   Shirlean Therisa ORN, NP  potassium chloride  (KLOR-CON ) 10 MEQ tablet Take 10 mEq by mouth daily. Patient not taking: Reported on 08/06/2023 06/23/23   [provider]  PROLIA 60 MG/ML SOSY injection Inject 60 mg into the skin every 6 (six) months.  05/31/18   [provider]  promethazine  (PHENERGAN ) 25 MG tablet Take 25 mg by mouth daily as needed for nausea or vomiting.    [provider]  triamcinolone   cream (KENALOG ) 0.1 % Apply 1 Application topically 2 (two) times daily. 07/03/23   Cleotilde Rogue, MD    Physical Exam: Vitals:   07/26/24 2052 07/26/24 2100 07/26/24 2115 07/26/24 2126  BP:  119/71    Pulse: (!) 106 98 (!) 108 (!) 108  Resp: 17 (!) 23 20 18   Temp:      TempSrc:      SpO2: (!) 89% 91% 91% 90%  Weight:      Height:       *** Data Reviewed: {Tip this will not be part of the note when signed- Document your independent interpretation of telemetry tracing, EKG, lab, Radiology test or any other diagnostic tests. Add any new diagnostic test ordered today. (Optional):26781} {Results:26384}  Assessment and Plan: No notes have been filed under this hospital service. Service: Hospitalist     Advance Care Planning:   Code Status: Prior ***  Consults: ***  Family Communication: ***  Severity of Illness: {Observation/Inpatient:21159}  Author: Posey Maier, DO 07/26/2024  9:50 PM  For on call review [email or web address].

## 2024-07-26 NOTE — ED Notes (Signed)
 See triage notes. Pt a/o. No obvious gen weakness noted. No resp distress or obvious shob noted. Nad.

## 2024-07-26 NOTE — ED Notes (Signed)
 Pt taken off 02 by PA and will ambulate after another tx.

## 2024-07-26 NOTE — ED Notes (Signed)
 Pt asking for pain medication, says she has been on it for over 20 years d/t chronic pain- Dr Alanda made aware.

## 2024-07-27 DIAGNOSIS — J441 Chronic obstructive pulmonary disease with (acute) exacerbation: Secondary | ICD-10-CM | POA: Diagnosis not present

## 2024-07-27 LAB — CBC
HCT: 36.5 % (ref 36.0–46.0)
Hemoglobin: 12.6 g/dL (ref 12.0–15.0)
MCH: 32 pg (ref 26.0–34.0)
MCHC: 34.5 g/dL (ref 30.0–36.0)
MCV: 92.6 fL (ref 80.0–100.0)
Platelets: 314 K/uL (ref 150–400)
RBC: 3.94 MIL/uL (ref 3.87–5.11)
RDW: 13.8 % (ref 11.5–15.5)
WBC: 17.4 K/uL — ABNORMAL HIGH (ref 4.0–10.5)
nRBC: 0 % (ref 0.0–0.2)

## 2024-07-27 LAB — RESPIRATORY PANEL BY PCR

## 2024-07-27 LAB — COMPREHENSIVE METABOLIC PANEL WITH GFR
ALT: 6 U/L (ref 0–44)
AST: 17 U/L (ref 15–41)
Albumin: 4.3 g/dL (ref 3.5–5.0)
Alkaline Phosphatase: 70 U/L (ref 38–126)
Anion gap: 13 (ref 5–15)
BUN: 9 mg/dL (ref 8–23)
CO2: 26 mmol/L (ref 22–32)
Calcium: 9.2 mg/dL (ref 8.9–10.3)
Chloride: 99 mmol/L (ref 98–111)
Creatinine, Ser: 0.51 mg/dL (ref 0.44–1.00)
GFR, Estimated: >60 mL/min (ref >60)
Glucose, Bld: 206 mg/dL — ABNORMAL HIGH (ref 70–99)
Potassium: 3.6 mmol/L (ref 3.5–5.1)
Sodium: 138 mmol/L (ref 135–145)
Total Bilirubin: 0.4 mg/dL (ref 0.0–1.2)
Total Protein: 6.9 g/dL (ref 6.5–8.1)

## 2024-07-27 LAB — MAGNESIUM: Magnesium: 2 mg/dL (ref 1.7–2.4)

## 2024-07-27 LAB — PHOSPHORUS: Phosphorus: 2.8 mg/dL (ref 2.5–4.6)

## 2024-07-27 LAB — TSH: TSH: 0.885 u[IU]/mL (ref 0.350–4.500)

## 2024-07-27 MED ORDER — ENOXAPARIN SODIUM 30 MG/0.3ML IJ SOSY
30.0000 mg | PREFILLED_SYRINGE | INTRAMUSCULAR | Status: DC
  Administered 2024-07-27 – 2024-07-28 (×2): 30 mg via SUBCUTANEOUS
  Filled 2024-07-27 (×2): qty 0.3

## 2024-07-27 MED ORDER — ACETAMINOPHEN 650 MG RE SUPP: 650.0000 mg | Freq: Four times a day (QID) | RECTAL | Status: DC | PRN

## 2024-07-27 MED ORDER — ACETAMINOPHEN 325 MG PO TABS
650.0000 mg | ORAL_TABLET | Freq: Four times a day (QID) | ORAL | Status: DC | PRN
  Administered 2024-07-27 (×2): 650 mg via ORAL
  Filled 2024-07-27 (×3): qty 2

## 2024-07-27 MED ORDER — DM-GUAIFENESIN ER 30-600 MG PO TB12
1.0000 | ORAL_TABLET | Freq: Two times a day (BID) | ORAL | Status: DC
  Administered 2024-07-27 – 2024-07-28 (×4): 1 via ORAL
  Filled 2024-07-27 (×4): qty 1

## 2024-07-27 MED ORDER — LEVALBUTEROL HCL 0.63 MG/3ML IN NEBU: 0.6300 mg | INHALATION_SOLUTION | Freq: Four times a day (QID) | RESPIRATORY_TRACT | Status: DC

## 2024-07-27 MED ORDER — AZITHROMYCIN 250 MG PO TABS
500.0000 mg | ORAL_TABLET | Freq: Every day | ORAL | Status: AC
  Administered 2024-07-27: 500 mg via ORAL
  Filled 2024-07-27: qty 2

## 2024-07-27 MED ORDER — METHYLPREDNISOLONE SODIUM SUCC 40 MG IJ SOLR
40.0000 mg | Freq: Two times a day (BID) | INTRAMUSCULAR | Status: DC
  Administered 2024-07-27 (×2): 40 mg via INTRAVENOUS
  Filled 2024-07-27 (×3): qty 1

## 2024-07-27 MED ORDER — ONDANSETRON HCL 4 MG PO TABS: 4.0000 mg | ORAL_TABLET | Freq: Four times a day (QID) | ORAL | Status: DC | PRN

## 2024-07-27 MED ORDER — IPRATROPIUM-ALBUTEROL 0.5-2.5 (3) MG/3ML IN SOLN
3.0000 mL | Freq: Four times a day (QID) | RESPIRATORY_TRACT | Status: DC
  Administered 2024-07-27 – 2024-07-28 (×6): 3 mL via RESPIRATORY_TRACT
  Filled 2024-07-27 (×6): qty 3

## 2024-07-27 MED ORDER — ALBUTEROL SULFATE (2.5 MG/3ML) 0.083% IN NEBU: 2.5000 mg | INHALATION_SOLUTION | RESPIRATORY_TRACT | Status: DC | PRN

## 2024-07-27 MED ORDER — ONDANSETRON HCL 4 MG/2ML IJ SOLN
4.0000 mg | Freq: Four times a day (QID) | INTRAMUSCULAR | Status: DC | PRN
  Administered 2024-07-27: 4 mg via INTRAVENOUS
  Filled 2024-07-27: qty 2

## 2024-07-27 MED ORDER — AZITHROMYCIN 250 MG PO TABS
250.0000 mg | ORAL_TABLET | Freq: Every day | ORAL | Status: DC
Start: 2024-07-28 — End: 2024-07-28

## 2024-07-27 MED ORDER — BUDESON-GLYCOPYRROL-FORMOTEROL 160-9-4.8 MCG/ACT IN AERO
2.0000 | INHALATION_SPRAY | Freq: Two times a day (BID) | RESPIRATORY_TRACT | Status: DC | PRN
Start: 2024-07-27 — End: 2024-07-28

## 2024-07-27 MED ORDER — MELATONIN 3 MG PO TABS
6.0000 mg | ORAL_TABLET | Freq: Once | ORAL | Status: AC
  Administered 2024-07-27: 6 mg via ORAL
  Filled 2024-07-27: qty 2

## 2024-07-27 MED ORDER — IPRATROPIUM BROMIDE 0.02 % IN SOLN: 0.5000 mg | RESPIRATORY_TRACT | Status: DC | PRN

## 2024-07-27 NOTE — ED Notes (Signed)
 RT at bedside.

## 2024-07-27 NOTE — Evaluation (Signed)
 Physical Therapy Evaluation Patient Details Name: [name] MRN: [id number] DOB: [birth year] Today's Date: 07/27/2024  History of Present Illness  [name] is a 78 y.o. female with medical history significant of hyperlipidemia, GERD, COPD on home oxygen as needed,chronic pancreatitis, anxiety, depression, chronic back pain, CAD, cervical cancer, CVA, tobacco abuse who presents to the emergency department due to about 1 week onset of cough which was initially nonproductive, but became productive after using Mucinex  with an initial production of green phlegm which has since changed to white phlegm, this was associated with generalized weakness and chills without fever.  She states that she has been depending on her home oxygen more frequently since onset of symptoms.  She continues to smoke.   Clinical Impression  Patient requires HOB partially raised for supine to sitting due to low back pain, tolerated ambulating in room without AD, no loss of balance, but limited mostly to increasing low back and right sided pain. Patient on room air during ambulation with SpO2 at dropping from 92% to 87%, that recovered to 89% after sitting at bedside. Patient will benefit from continued skilled physical therapy in hospital and recommended venue below to increase strength, balance, endurance for safe ADLs and gait.         If plan is discharge home, recommend the following: Help with stairs or ramp for entrance;Assistance with cooking/housework;Assist for transportation;A little help with bathing/dressing/bathroom   Can travel by private vehicle        Equipment Recommendations None recommended by PT  Recommendations for Other Services       Functional Status Assessment Patient has had a recent decline in their functional status and/or demonstrates limited ability to make significant improvements in function in a reasonable and predictable amount of time     Precautions / Restrictions  Precautions Precautions: None Recall of Precautions/Restrictions: Intact Restrictions Weight Bearing Restrictions Per Provider Order: No      Mobility  Bed Mobility Overal bed mobility: Modified Independent             General bed mobility comments: requires HOB raised    Transfers Overall transfer level: Needs assistance Equipment used: None Transfers: Sit to/from Stand, Bed to chair/wheelchair/BSC Sit to Stand: Supervision   Step pivot transfers: Supervision       General transfer comment: slightly labored movement    Ambulation/Gait Ambulation/Gait assistance: Supervision Gait Distance (Feet): 45 Feet Assistive device: None, 1 person hand held assist Gait Pattern/deviations: Decreased step length - right, Decreased step length - left, Decreased stride length Gait velocity: decreased     General Gait Details: slightly labored movement without loss of balance or need for an AD, but limited mostly due to c/o fatigue and increasing low back pain  Stairs            Wheelchair Mobility     Tilt Bed    Modified Rankin (Stroke Patients Only)       Balance Overall balance assessment: Needs assistance Sitting-balance support: Feet supported, No upper extremity supported Sitting balance-Leahy Scale: Good Sitting balance - Comments: seated at EOB   Standing balance support: During functional activity, No upper extremity supported Standing balance-Leahy Scale: Fair Standing balance comment: fair/good without AD                             Pertinent Vitals/Pain Pain Assessment Pain Assessment: 0-10 Pain Score: 4  Pain Location: chronic low back, right flank Pain  Descriptors / Indicators: Discomfort, Sore, Grimacing Pain Intervention(s): Limited activity within patient's tolerance, Monitored during session, Repositioned    Home Living Family/patient expects to be discharged to:: Private residence Living Arrangements: Children Available  Help at Discharge: Family;Available 24 hours/day Type of Home: House Home Access: Stairs to enter Entrance Stairs-Rails: Left Entrance Stairs-Number of Steps: 3-4   Home Layout: One level Home Equipment: Agricultural Consultant (2 wheels);Cane - single point;Shower seat      Prior Function Prior Level of Function : Needs assist       Physical Assist : Mobility (physical);ADLs (physical) Mobility (physical): Bed mobility;Gait;Transfers;Stairs   Mobility Comments: household and short distanced community ambulation without AD, does not drive ADLs Comments: Independent for most household, assisted for community     Extremity/Trunk Assessment   Upper Extremity Assessment Upper Extremity Assessment: Overall WFL for tasks assessed    Lower Extremity Assessment Lower Extremity Assessment: Generalized weakness    Cervical / Trunk Assessment Cervical / Trunk Assessment: Kyphotic  Communication   Communication Communication: No apparent difficulties    Cognition Arousal: Alert Behavior During Therapy: WFL for tasks assessed/performed   PT - Cognitive impairments: No apparent impairments                         Following commands: Intact       Cueing Cueing Techniques: Verbal cues     General Comments      Exercises     Assessment/Plan    PT Assessment Patient needs continued PT services  PT Problem List Decreased strength;Decreased activity tolerance;Decreased balance;Decreased mobility       PT Treatment Interventions DME instruction;Gait training;Stair training;Functional mobility training;Therapeutic activities;Therapeutic exercise;Balance training;Patient/family education    PT Goals (Current goals can be found in the Care Plan section)  Acute Rehab PT Goals Patient Stated Goal: return home with family to assist PT Goal Formulation: With patient/family Time For Goal Achievement: 07/31/24 Potential to Achieve Goals: Good    Frequency Min 2X/week      Co-evaluation               AM-PAC PT 6 Clicks Mobility  Outcome Measure Help needed turning from your back to your side while in a flat bed without using bedrails?: None Help needed moving from lying on your back to sitting on the side of a flat bed without using bedrails?: None Help needed moving to and from a bed to a chair (including a wheelchair)?: A Little Help needed standing up from a chair using your arms (e.g., wheelchair or bedside chair)?: None Help needed to walk in hospital room?: A Little Help needed climbing 3-5 steps with a railing? : A Little 6 Click Score: 21    End of Session   Activity Tolerance: Patient tolerated treatment well;Patient limited by fatigue Patient left: in bed;with call bell/phone within reach;with family/visitor present Nurse Communication: Mobility status PT Visit Diagnosis: Unsteadiness on feet (R26.81);Other abnormalities of gait and mobility (R26.89);Muscle weakness (generalized) (M62.81)    Time: 8542-8481 PT Time Calculation (min) (ACUTE ONLY): 21 min   Charges:   PT Evaluation $PT Eval Moderate Complexity: 1 Mod PT Treatments $Therapeutic Activity: 8-22 mins PT General Charges $$ ACUTE PT VISIT: 1 Visit         3:33 PM, 07/27/24 Lynwood Music, MPT Physical Therapist with [facility] 336 [phone] office [phone] mobile phone

## 2024-07-27 NOTE — Progress Notes (Addendum)
 PROGRESS NOTE    [name]  FMW:[id number] DOB: 23-Nov-1945 DOA: 07/26/2024 PCP: Shona Norleen PEDLAR, MD   Brief Narrative:    [name] is a 78 y.o. female with medical history significant of hyperlipidemia, GERD, COPD on home oxygen as needed,chronic pancreatitis, anxiety, depression, chronic back pain, CAD, cervical cancer, CVA, tobacco abuse who presents to the emergency department due to about 1 week onset of cough which was initially nonproductive, but became productive after using Mucinex  with an initial production of green phlegm which has since changed to white phlegm, this was associated with generalized weakness and chills without fever.  Patient was admitted for acute COPD exacerbation and has oxygen at home only as needed.  Assessment & Plan:   Principal Problem:   Acute exacerbation of chronic obstructive pulmonary disease (COPD) (HCC)  Assessment and Plan:   Acute exacerbation of COPD Acute on chronic respiratory failure with hypoxia Continue Xopenex,Atrovent , Mucinex , Breztri, Solu-Medrol , azithromycin. Continue Protonix  to prevent steroid-induced ulcer Continue incentive spirometry and flutter valve Continue supplemental oxygen to maintain O2 sat > 92% with plan to wean patient off oxygen as tolerated   GERD Continue Protonix    Chronic back pain Continue Percocet per home regimen   Mixed hyperlipidemia Continue Pravachol , Zetia    Chronic pancreatitis Continue Creon   Ongoing tobacco abuse Counseled on cessation    DVT prophylaxis:Lovenox  Code Status: Full Family Communication: None at bedside Disposition Plan:  Status is: Observation The patient will require care spanning > 2 midnights and should be moved to inpatient because: Need for IV medications.   Consultants:  None  Procedures:  None  Antimicrobials:  Anti-infectives (From admission, onward)    Start     Dose/Rate Route Frequency Ordered Stop   07/28/24 1000  azithromycin  (ZITHROMAX) tablet 250 mg       Placed in Followed by Linked Group   250 mg Oral Daily 07/27/24 0037 08/01/24 0959   07/27/24 1000  azithromycin (ZITHROMAX) tablet 500 mg       Placed in Followed by Linked Group   500 mg Oral Daily 07/27/24 0037 07/27/24 0800       Subjective: Patient seen and evaluated today with ongoing mild shortness of breath and is not quite at baseline.  She continues to have a mild cough.  Objective: Vitals:   07/27/24 0650 07/27/24 0650 07/27/24 0650 07/27/24 1000  BP: (!) 105/93   127/81  Pulse: 84   91  Resp: 14   (!) 22  Temp:  (!) 97.3 F (36.3 C)    TempSrc:  Oral    SpO2: 91%  94% 94%  Weight:      Height:       No intake or output data in the 24 hours ending 07/27/24 1108 Filed Weights   07/26/24 1714  Weight: 33.1 kg    Examination:  General exam: Appears calm and comfortable  Respiratory system: Clear to auscultation. Respiratory effort normal. Cardiovascular system: S1 & S2 heard, RRR.  Gastrointestinal system: Abdomen is soft Central nervous system: Alert and awake Extremities: No edema Skin: No significant lesions noted Psychiatry: Flat affect.    Data Reviewed: I have personally reviewed following labs and imaging studies  CBC: Recent Labs  Lab 07/26/24 1733 07/27/24 0336  WBC 14.7* 17.4*  NEUTROABS 10.4*  --   HGB 14.1 12.6  HCT 41.7 36.5  MCV 93.5 92.6  PLT 362 314   Basic Metabolic Panel: Recent Labs  Lab 07/26/24 1733 07/27/24 0336  NA 140 138  K 3.1* 3.6  CL 100 99  CO2 29 26  GLUCOSE 78 206*  BUN 7* 9  CREATININE 0.47 0.51  CALCIUM  8.9 9.2  MG  --  2.0  PHOS  --  2.8   GFR: Estimated Creatinine Clearance: 30.3 mL/min (by C-G formula based on SCr of 0.51 mg/dL). Liver Function Tests: Recent Labs  Lab 07/26/24 1733 07/27/24 0336  AST 19 17  ALT <5 6  ALKPHOS 78 70  BILITOT 0.3 0.4  PROT 7.3 6.9  ALBUMIN 4.5 4.3   No results for input(s): LIPASE, AMYLASE in the last 168 hours. No  results for input(s): AMMONIA in the last 168 hours. Coagulation Profile: No results for input(s): INR, PROTIME in the last 168 hours. Cardiac Enzymes: No results for input(s): CKTOTAL, CKMB, CKMBINDEX, TROPONINI in the last 168 hours. BNP (last 3 results) No results for input(s): PROBNP in the last 8760 hours. HbA1C: No results for input(s): HGBA1C in the last 72 hours. CBG: No results for input(s): GLUCAP in the last 168 hours. Lipid Profile: No results for input(s): CHOL, HDL, LDLCALC, TRIG, CHOLHDL, LDLDIRECT in the last 72 hours. Thyroid  Function Tests: Recent Labs    07/27/24 0336  TSH 0.885   Anemia Panel: No results for input(s): VITAMINB12, FOLATE, FERRITIN, TIBC, IRON, RETICCTPCT in the last 72 hours. Sepsis Labs: No results for input(s): PROCALCITON, LATICACIDVEN in the last 168 hours.  Recent Results (from the past 240 hours)  Resp panel by RT-PCR (RSV, Flu A&B, Covid) Anterior Nasal Swab     Status: None   Collection Time: 07/26/24  3:20 PM   Specimen: Anterior Nasal Swab  Result Value Ref Range Status   SARS Coronavirus 2 by RT PCR NEGATIVE NEGATIVE Final    Comment: (NOTE) SARS-CoV-2 target nucleic acids are NOT DETECTED.  The SARS-CoV-2 RNA is generally detectable in upper respiratory specimens during the acute phase of infection. The lowest concentration of SARS-CoV-2 viral copies this assay can detect is 138 copies/mL. A negative result does not preclude SARS-Cov-2 infection and should not be used as the sole basis for treatment or other patient management decisions. A negative result may occur with  improper specimen collection/handling, submission of specimen other than nasopharyngeal swab, presence of viral mutation(s) within the areas targeted by this assay, and inadequate number of viral copies(<138 copies/mL). A negative result must be combined with clinical observations, patient history, and  epidemiological information. The expected result is Negative.  Fact Sheet for Patients:  bloggercourse.com  Fact Sheet for Healthcare Providers:  seriousbroker.it  This test is no t yet approved or cleared by the [place]  FDA and  has been authorized for detection and/or diagnosis of SARS-CoV-2 by FDA under an Emergency Use Authorization (EUA). This EUA will remain  in effect (meaning this test can be used) for the duration of the COVID-19 declaration under Section 564(b)(1) of the Act, 21 U.S.C.section 360bbb-3(b)(1), unless the authorization is terminated  or revoked sooner.       Influenza A by PCR NEGATIVE NEGATIVE Final   Influenza B by PCR NEGATIVE NEGATIVE Final    Comment: (NOTE) The Xpert Xpress SARS-CoV-2/FLU/RSV plus assay is intended as an aid in the diagnosis of influenza from Nasopharyngeal swab specimens and should not be used as a sole basis for treatment. Nasal washings and aspirates are unacceptable for Xpert Xpress SARS-CoV-2/FLU/RSV testing.  Fact Sheet for Patients: bloggercourse.com  Fact Sheet for Healthcare Providers: seriousbroker.it  This test is not yet approved  or cleared by the [place]  FDA and has been authorized for detection and/or diagnosis of SARS-CoV-2 by FDA under an Emergency Use Authorization (EUA). This EUA will remain in effect (meaning this test can be used) for the duration of the COVID-19 declaration under Section 564(b)(1) of the Act, 21 U.S.C. section 360bbb-3(b)(1), unless the authorization is terminated or revoked.     Resp Syncytial Virus by PCR NEGATIVE NEGATIVE Final    Comment: (NOTE) Fact Sheet for Patients: bloggercourse.com  Fact Sheet for Healthcare Providers: seriousbroker.it  This test is not yet approved or cleared by the [place]  FDA and has been  authorized for detection and/or diagnosis of SARS-CoV-2 by FDA under an Emergency Use Authorization (EUA). This EUA will remain in effect (meaning this test can be used) for the duration of the COVID-19 declaration under Section 564(b)(1) of the Act, 21 U.S.C. section 360bbb-3(b)(1), unless the authorization is terminated or revoked.  Performed at [facility], [address] [place], [place] [address]          Radiology Studies: [facility] Chest [facility] 1 View Result Date: 07/26/2024 CLINICAL DATA:  Shortness of breath. EXAM: PORTABLE CHEST 1 VIEW COMPARISON:  Chest radiograph dated 11/02/2023. FINDINGS: Background of emphysema. No focal consolidation, pleural effusion or pneumothorax. The cardiac silhouette is within normal limits. No acute osseous pathology. IMPRESSION: No active disease. Electronically Signed   By: Vanetta Chou M.D.   On: 07/26/2024 17:49        Scheduled Meds:  [START ON 07/28/2024] azithromycin  250 mg Oral Daily   dextromethorphan-guaiFENesin   1 tablet Oral BID   enoxaparin  (LOVENOX ) injection  30 mg Subcutaneous Q24H   ezetimibe   10 mg Oral Daily   ipratropium-albuterol   3 mL Nebulization Q6H   lipase/protease/amylase  24,000 Units Oral TID WC   methylPREDNISolone  (SOLU-MEDROL ) injection  40 mg Intravenous Q12H   pantoprazole   40 mg Oral BID AC   pravastatin   10 mg Oral q1800     LOS: 0 days    Time spent: 55 minutes    Aaliah Jorgenson JONETTA Fairly, DO [facility]  If 7PM-7AM, please contact night-coverage [email or web address] 07/27/2024, 11:08 AM

## 2024-07-27 NOTE — Plan of Care (Signed)
  Problem: Acute Rehab PT Goals(only PT should resolve) Goal: Pt Will Go Supine/Side To Sit Outcome: Progressing Flowsheets (Taken 07/27/2024 1534) Pt will go Supine/Side to Sit:  Independently  with modified independence Goal: Patient Will Transfer Sit To/From Stand Outcome: Progressing Flowsheets (Taken 07/27/2024 1534) Patient will transfer sit to/from stand: Independently Goal: Pt Will Transfer Bed To Chair/Chair To Bed Outcome: Progressing Flowsheets (Taken 07/27/2024 1534) Pt will Transfer Bed to Chair/Chair to Bed:  Independently  with modified independence Goal: Pt Will Ambulate Outcome: Progressing Flowsheets (Taken 07/27/2024 1534) Pt will Ambulate:  75 feet  with modified independence  with least restrictive assistive device  3:35 PM, 07/27/24 Lynwood Music, MPT Physical Therapist with [facility] 336 [phone] office [phone] mobile phone

## 2024-07-27 NOTE — Progress Notes (Signed)
 Transition of Care Department [facility]) has reviewed patient and no other TOC needs have been identified at this time. We will continue to monitor patient advancement through interdisciplinary progression rounds. If new patient transition needs arise, please place a TOC consult.   07/27/24 0753  TOC Brief Assessment  Insurance and Status Reviewed  Patient has primary care physician Yes  Home environment has been reviewed Lives with son.  Prior level of function: Son assists.  Prior/Current Home Services No current home services  Social Drivers of Health Review SDOH reviewed no interventions necessary  Readmission risk has been reviewed Yes  Transition of care needs no transition of care needs at this time

## 2024-07-27 NOTE — Care Management Obs Status (Signed)
 MEDICARE OBSERVATION STATUS NOTIFICATION   Patient Details  Name: [name] MRN: [id number] Date of Birth: 28-Nov-1945   Medicare Observation Status Notification Given:  Yes    Duwaine LITTIE Ada 07/27/2024, 3:22 PM

## 2024-07-27 NOTE — Plan of Care (Signed)
   Problem: Activity: Goal: Risk for activity intolerance will decrease Outcome: Progressing   Problem: Coping: Goal: Level of anxiety will decrease Outcome: Progressing

## 2024-07-27 NOTE — ED Notes (Signed)
 patient is wondering if we can do anything for her being sweaty. she said it comes and goes and has been going on for about 4-5 months. MD Maree notified.

## 2024-07-28 DIAGNOSIS — J441 Chronic obstructive pulmonary disease with (acute) exacerbation: Secondary | ICD-10-CM | POA: Diagnosis not present

## 2024-07-28 LAB — MAGNESIUM: Magnesium: 2.2 mg/dL (ref 1.7–2.4)

## 2024-07-28 LAB — CBC
HCT: 35.6 % — ABNORMAL LOW (ref 36.0–46.0)
Hemoglobin: 12.1 g/dL (ref 12.0–15.0)
MCH: 31.6 pg (ref 26.0–34.0)
MCHC: 34 g/dL (ref 30.0–36.0)
MCV: 93 fL (ref 80.0–100.0)
Platelets: 321 K/uL (ref 150–400)
RBC: 3.83 MIL/uL — ABNORMAL LOW (ref 3.87–5.11)
RDW: 13.9 % (ref 11.5–15.5)
WBC: 25.9 K/uL — ABNORMAL HIGH (ref 4.0–10.5)
nRBC: 0 % (ref 0.0–0.2)

## 2024-07-28 LAB — BASIC METABOLIC PANEL WITH GFR
Anion gap: 14 (ref 5–15)
BUN: 12 mg/dL (ref 8–23)
CO2: 25 mmol/L (ref 22–32)
Calcium: 9.4 mg/dL (ref 8.9–10.3)
Chloride: 100 mmol/L (ref 98–111)
Creatinine, Ser: 0.4 mg/dL — ABNORMAL LOW (ref 0.44–1.00)
GFR, Estimated: >60 mL/min (ref >60)
Glucose, Bld: 172 mg/dL — ABNORMAL HIGH (ref 70–99)
Potassium: 3.6 mmol/L (ref 3.5–5.1)
Sodium: 138 mmol/L (ref 135–145)

## 2024-07-28 LAB — PROCALCITONIN: Procalcitonin: <0.1 ng/mL

## 2024-07-28 MED ORDER — LEVOFLOXACIN 750 MG PO TABS
750.0000 mg | ORAL_TABLET | ORAL | Status: DC
  Administered 2024-07-28: 750 mg via ORAL
  Filled 2024-07-28: qty 1

## 2024-07-28 MED ORDER — METHYLPREDNISOLONE 4 MG PO TBPK: ORAL_TABLET | ORAL | 0 refills | Status: AC

## 2024-07-28 MED ORDER — PREDNISONE 20 MG PO TABS: 50.0000 mg | ORAL_TABLET | Freq: Every day | ORAL | Status: DC

## 2024-07-28 MED ORDER — LEVOFLOXACIN 750 MG PO TABS: 750.0000 mg | ORAL_TABLET | Freq: Every day | ORAL | 0 refills | Status: AC

## 2024-07-28 MED ORDER — GUAIFENESIN-DM 100-10 MG/5ML PO SYRP: 10.0000 mL | ORAL_SOLUTION | Freq: Three times a day (TID) | ORAL | 0 refills | Status: AC

## 2024-07-28 MED ORDER — LEVOFLOXACIN 750 MG PO TABS: 750.0000 mg | ORAL_TABLET | Freq: Every day | ORAL | 0 refills | Status: DC

## 2024-07-28 MED ORDER — METHYLPREDNISOLONE SODIUM SUCC 40 MG IJ SOLR
40.0000 mg | INTRAMUSCULAR | Status: DC
  Administered 2024-07-28: 40 mg via INTRAVENOUS

## 2024-07-28 NOTE — Discharge Summary (Signed)
 Physician Discharge Summary   Patient: [name] MRN: [id number] DOB: [birth year]  Admit date:     07/26/2024  Discharge date: 07/28/24  Discharge Physician: Adriana DELENA Grams   PCP: Shona Norleen PEDLAR, MD   Recommendations at discharge:   Follow-up with PCP in 1 week Continue current medications  Discharge Diagnoses:  [name] is a 78 y.o. female with medical history significant of hyperlipidemia, GERD, COPD on home oxygen as needed,chronic pancreatitis, anxiety, depression, chronic back pain, CAD, cervical cancer, CVA, tobacco abuse who presents to the emergency department due to about 1 week onset of cough which was initially nonproductive, but became productive after using Mucinex  with an initial production of green phlegm which has since changed to white phlegm, this was associated with generalized weakness and chills without fever.  Patient was admitted for acute COPD exacerbation and has oxygen at home only as needed.           Acute exacerbation of COPD Acute on chronic respiratory failure with hypoxia Continue Xopenex,Atrovent , Mucinex , Breztri, Solu-Medrol , azithromycin.>>>  Switching to p.o. steroids, oral antibiotics Continue Protonix  to prevent steroid-induced ulcer Continue incentive spirometry and flutter valve Weaned off supplemental oxygen, - Much improved on room air, satting 94%   GERD Continue Protonix    Chronic back pain Continue Percocet per home regimen   Mixed hyperlipidemia Continue Pravachol , Zetia    Chronic pancreatitis Continue Creon    Ongoing tobacco abuse Counseled on cessation      Procedures performed: None Disposition: Home Diet recommendation:  Regular MSSA bacteremia DISCHARGE MEDICATION: Allergies as of 07/28/2024       Reactions   Motrin [ibuprofen] Anaphylaxis, Hives   Iohexol  Hives, Swelling   Requires pre-meds   Ivp Dye [iodinated Contrast Media] Hives, Swelling   Requires pre-meds   Strawberry  (diagnostic) Itching        Medication List     STOP taking these medications    cyclobenzaprine  5 MG tablet Commonly known as: FLEXERIL    DULoxetine  60 MG capsule Commonly known as: CYMBALTA    hydrOXYzine  25 MG tablet Commonly known as: ATARAX    potassium chloride  10 MEQ tablet Commonly known as: KLOR-CON        TAKE these medications    acetaminophen  325 MG tablet Commonly known as: TYLENOL  Take 325 mg by mouth every 6 (six) hours as needed for mild pain.   albuterol  108 (90 Base) MCG/ACT inhaler Commonly known as: VENTOLIN  HFA Inhale 2 puffs into the lungs every 4 (four) hours as needed for wheezing or shortness of breath.   Breztri Aerosphere 160-9-4.8 MCG/ACT Aero inhaler Generic drug: budesonide-glycopyrrolate -formoterol  Inhale 2 puffs into the lungs 2 (two) times daily.   CALCIUM  + VITAMIN D3 PO Take 1 tablet by mouth daily.   cilostazol 100 MG tablet Commonly known as: PLETAL Take 100 mg by mouth 2 (two) times daily.   Creon  24000-76000 units Cpep Generic drug: Pancrelipase  (Lip-Prot-Amyl) TAKE 2 CAPSULES WITH EACH MEALS AND 1 CAPSULE WITH SNACKS. MAX 2 SNACKS PER DAY.   escitalopram  10 MG tablet Commonly known as: LEXAPRO  Take 10 mg by mouth daily.   ezetimibe  10 MG tablet Commonly known as: ZETIA  Take 10 mg by mouth daily.   fluticasone  50 MCG/ACT nasal spray Commonly known as: FLONASE Place 2 sprays into both nostrils daily as needed for allergies.   guaiFENesin -dextromethorphan 100-10 MG/5ML syrup Commonly known as: ROBITUSSIN DM Take 10 mLs by mouth every 8 (eight) hours for 5 days.   levofloxacin  750 MG tablet Commonly known  as: LEVAQUIN  Take 1 tablet (750 mg total) by mouth daily for 4 days. Start taking on: July 29, 2024   LORazepam  1 MG tablet Commonly known as: ATIVAN  Take 1 mg by mouth 4 (four) times daily.   lovastatin 10 MG tablet Commonly known as: MEVACOR Take 10 mg by mouth daily.   lubiprostone  24 MCG  capsule Commonly known as: AMITIZA  TAKE 1 CAPSULE BY MOUTH TWICE DAILY WITH A MEAL.   megestrol  40 MG/ML suspension Commonly known as: MEGACE  Take 400 mg by mouth daily.   methylPREDNISolone  4 MG Tbpk tablet Commonly known as: MEDROL  DOSEPAK Medrol  Dosepak take as instructed   mirtazapine 7.5 MG tablet Commonly known as: REMERON Take 7.5 mg by mouth at bedtime.   mupirocin  cream 2 % Commonly known as: BACTROBAN  Apply 1 Application topically 2 (two) times daily. What changed:  when to take this reasons to take this   ondansetron  4 MG tablet Commonly known as: ZOFRAN  Take 4 mg by mouth daily.   oxyCODONE -acetaminophen  10-325 MG tablet Commonly known as: PERCOCET Take 1 tablet by mouth every 6 (six) hours as needed for pain.   pantoprazole  40 MG tablet Commonly known as: Protonix  Take 1 tablet (40 mg total) by mouth 2 (two) times daily before a meal.   Prolia 60 MG/ML Sosy injection Generic drug: denosumab Inject 60 mg into the skin every 6 (six) months.   promethazine  25 MG tablet Commonly known as: PHENERGAN  Take 25 mg by mouth daily as needed for nausea or vomiting.   triamcinolone  cream 0.1 % Commonly known as: KENALOG  Apply 1 Application topically 2 (two) times daily.        Discharge Exam: Filed Weights   07/26/24 1714  Weight: 33.1 kg        General:  AAO x 3,  cooperative, no distress; cachectic, chronically ill looking female  HEENT:  Normocephalic, PERRL, otherwise with in Normal limits   Neuro:  CNII-XII intact. , normal motor and sensation, reflexes intact   Lungs:   Clear to auscultation BL, Respirations unlabored,  No wheezes / crackles  Cardio:    S1/S2, RRR, No murmure, No Rubs or Gallops   Abdomen:  Soft, non-tender, bowel sounds active all four quadrants, no guarding or peritoneal signs.  Muscular  skeletal:  Limited exam -global generalized weaknesses - in bed, able to move all 4 extremities,   2+ pulses,  symmetric, No pitting  edema  Skin:  Dry, warm to touch, negative for any Rashes,  Wounds: Please see nursing documentation          Condition at discharge: stable   The results of significant diagnostics from this hospitalization (including imaging, microbiology, ancillary and laboratory) are listed below for reference.   Imaging Studies: DG Chest Port 1 View Result Date: 07/26/2024 CLINICAL DATA:  Shortness of breath. EXAM: PORTABLE CHEST 1 VIEW COMPARISON:  Chest radiograph dated 11/02/2023. FINDINGS: Background of emphysema. No focal consolidation, pleural effusion or pneumothorax. The cardiac silhouette is within normal limits. No acute osseous pathology. IMPRESSION: No active disease. Electronically Signed   By: Vanetta Chou M.D.   On: 07/26/2024 17:49    Microbiology: Results for orders placed or performed during the hospital encounter of 07/26/24  Resp panel by RT-PCR (RSV, Flu A&B, Covid) Anterior Nasal Swab     Status: None   Collection Time: 07/26/24  3:20 PM   Specimen: Anterior Nasal Swab  Result Value Ref Range Status   SARS Coronavirus 2 by RT PCR NEGATIVE  NEGATIVE Final    Comment: (NOTE) SARS-CoV-2 target nucleic acids are NOT DETECTED.  The SARS-CoV-2 RNA is generally detectable in upper respiratory specimens during the acute phase of infection. The lowest concentration of SARS-CoV-2 viral copies this assay can detect is 138 copies/mL. A negative result does not preclude SARS-Cov-2 infection and should not be used as the sole basis for treatment or other patient management decisions. A negative result may occur with  improper specimen collection/handling, submission of specimen other than nasopharyngeal swab, presence of viral mutation(s) within the areas targeted by this assay, and inadequate number of viral copies(<138 copies/mL). A negative result must be combined with clinical observations, patient history, and epidemiological information. The expected result is  Negative.  Fact Sheet for Patients:  bloggercourse.com  Fact Sheet for Healthcare Providers:  seriousbroker.it  This test is no t yet approved or cleared by the [place]  FDA and  has been authorized for detection and/or diagnosis of SARS-CoV-2 by FDA under an Emergency Use Authorization (EUA). This EUA will remain  in effect (meaning this test can be used) for the duration of the COVID-19 declaration under Section 564(b)(1) of the Act, 21 U.S.C.section 360bbb-3(b)(1), unless the authorization is terminated  or revoked sooner.       Influenza A by PCR NEGATIVE NEGATIVE Final   Influenza B by PCR NEGATIVE NEGATIVE Final    Comment: (NOTE) The Xpert Xpress SARS-CoV-2/FLU/RSV plus assay is intended as an aid in the diagnosis of influenza from Nasopharyngeal swab specimens and should not be used as a sole basis for treatment. Nasal washings and aspirates are unacceptable for Xpert Xpress SARS-CoV-2/FLU/RSV testing.  Fact Sheet for Patients: bloggercourse.com  Fact Sheet for Healthcare Providers: seriousbroker.it  This test is not yet approved or cleared by the [place]  FDA and has been authorized for detection and/or diagnosis of SARS-CoV-2 by FDA under an Emergency Use Authorization (EUA). This EUA will remain in effect (meaning this test can be used) for the duration of the COVID-19 declaration under Section 564(b)(1) of the Act, 21 U.S.C. section 360bbb-3(b)(1), unless the authorization is terminated or revoked.     Resp Syncytial Virus by PCR NEGATIVE NEGATIVE Final    Comment: (NOTE) Fact Sheet for Patients: bloggercourse.com  Fact Sheet for Healthcare Providers: seriousbroker.it  This test is not yet approved or cleared by the [place]  FDA and has been authorized for detection and/or diagnosis of  SARS-CoV-2 by FDA under an Emergency Use Authorization (EUA). This EUA will remain in effect (meaning this test can be used) for the duration of the COVID-19 declaration under Section 564(b)(1) of the Act, 21 U.S.C. section 360bbb-3(b)(1), unless the authorization is terminated or revoked.  Performed at [facility], [address] [place], [place] [address]   Respiratory (~20 pathogens) panel by PCR     Status: None   Collection Time: 07/27/24 10:09 AM   Specimen: Nasopharyngeal Swab; Respiratory  Result Value Ref Range Status   Adenovirus NOT DETECTED NOT DETECTED Final   Coronavirus 229E NOT DETECTED NOT DETECTED Final    Comment: (NOTE) The Coronavirus on the Respiratory Panel, DOES NOT test for the novel  Coronavirus (2019 nCoV)    Coronavirus HKU1 NOT DETECTED NOT DETECTED Final   Coronavirus NL63 NOT DETECTED NOT DETECTED Final   Coronavirus OC43 NOT DETECTED NOT DETECTED Final   Metapneumovirus NOT DETECTED NOT DETECTED Final   Rhinovirus / Enterovirus NOT DETECTED NOT DETECTED Final   Influenza A NOT DETECTED NOT DETECTED Final   Influenza  B NOT DETECTED NOT DETECTED Final   Parainfluenza Virus 1 NOT DETECTED NOT DETECTED Final   Parainfluenza Virus 2 NOT DETECTED NOT DETECTED Final   Parainfluenza Virus 3 NOT DETECTED NOT DETECTED Final   Parainfluenza Virus 4 NOT DETECTED NOT DETECTED Final   Respiratory Syncytial Virus NOT DETECTED NOT DETECTED Final   Bordetella pertussis NOT DETECTED NOT DETECTED Final   Bordetella Parapertussis NOT DETECTED NOT DETECTED Final   Chlamydophila pneumoniae NOT DETECTED NOT DETECTED Final   Mycoplasma pneumoniae NOT DETECTED NOT DETECTED Final    Comment: Performed at [facility], [address], [place], [place] [address]    Labs: CBC: Recent Labs  Lab 07/26/24 1733 07/27/24 0336 07/28/24 0432  WBC 14.7* 17.4* 25.9*  NEUTROABS 10.4*  --   --   HGB 14.1 12.6 12.1  HCT 41.7 36.5 35.6*  MCV 93.5 92.6 93.0  PLT 362 314  321   Basic Metabolic Panel: Recent Labs  Lab 07/26/24 1733 07/27/24 0336 07/28/24 0432  NA 140 138 138  K 3.1* 3.6 3.6  CL 100 99 100  CO2 29 26 25   GLUCOSE 78 206* 172*  BUN 7* 9 12  CREATININE 0.47 0.51 0.40*  CALCIUM  8.9 9.2 9.4  MG  --  2.0 2.2  PHOS  --  2.8  --    Liver Function Tests: Recent Labs  Lab 07/26/24 1733 07/27/24 0336  AST 19 17  ALT <5 6  ALKPHOS 78 70  BILITOT 0.3 0.4  PROT 7.3 6.9  ALBUMIN 4.5 4.3   CBG: No results for input(s): GLUCAP in the last 168 hours.  Discharge time spent: greater than 30 minutes.  Signed: Adriana DELENA Grams, MD [facility] 07/28/2024

## 2024-07-28 NOTE — Progress Notes (Signed)
Discharge instructions reviewed with patient, patient verbalized understanding of instructions. Patient discharged home with family in stable condition.  

## 2024-08-18 ENCOUNTER — Other Ambulatory Visit (HOSPITAL_COMMUNITY): Payer: Self-pay

## 2024-08-18 DIAGNOSIS — F17210 Nicotine dependence, cigarettes, uncomplicated: Secondary | ICD-10-CM

## 2024-09-11 ENCOUNTER — Ambulatory Visit (HOSPITAL_COMMUNITY)

## 2024-10-05 ENCOUNTER — Encounter (HOSPITAL_COMMUNITY): Payer: Self-pay

## 2024-10-05 ENCOUNTER — Ambulatory Visit (HOSPITAL_COMMUNITY): Admission: RE | Admit: 2024-10-05 | Source: Ambulatory Visit
